# Patient Record
Sex: Male | Born: 1945 | Race: White | Hispanic: No | Marital: Married | Smoking: Never smoker
Health system: Southern US, Community
[De-identification: ages and names within clinical notes are randomized; demographics above are authoritative.]

## PROBLEM LIST (undated history)

## (undated) DIAGNOSIS — Z86718 Personal history of other venous thrombosis and embolism: Secondary | ICD-10-CM

## (undated) DIAGNOSIS — N138 Other obstructive and reflux uropathy: Secondary | ICD-10-CM

## (undated) DIAGNOSIS — I10 Essential (primary) hypertension: Secondary | ICD-10-CM

## (undated) DIAGNOSIS — R972 Elevated prostate specific antigen [PSA]: Secondary | ICD-10-CM

## (undated) DIAGNOSIS — Z860100 Personal history of colon polyps, unspecified: Secondary | ICD-10-CM

## (undated) DIAGNOSIS — M545 Low back pain, unspecified: Secondary | ICD-10-CM

## (undated) DIAGNOSIS — F419 Anxiety disorder, unspecified: Secondary | ICD-10-CM

## (undated) DIAGNOSIS — N401 Enlarged prostate with lower urinary tract symptoms: Secondary | ICD-10-CM

## (undated) DIAGNOSIS — M4800 Spinal stenosis, site unspecified: Secondary | ICD-10-CM

## (undated) DIAGNOSIS — E785 Hyperlipidemia, unspecified: Secondary | ICD-10-CM

## (undated) DIAGNOSIS — T8859XA Other complications of anesthesia, initial encounter: Secondary | ICD-10-CM

## (undated) DIAGNOSIS — K5730 Diverticulosis of large intestine without perforation or abscess without bleeding: Secondary | ICD-10-CM

## (undated) DIAGNOSIS — T4145XA Adverse effect of unspecified anesthetic, initial encounter: Secondary | ICD-10-CM

## (undated) DIAGNOSIS — J984 Other disorders of lung: Secondary | ICD-10-CM

## (undated) DIAGNOSIS — I872 Venous insufficiency (chronic) (peripheral): Secondary | ICD-10-CM

## (undated) DIAGNOSIS — N200 Calculus of kidney: Secondary | ICD-10-CM

## (undated) DIAGNOSIS — D689 Coagulation defect, unspecified: Secondary | ICD-10-CM

## (undated) DIAGNOSIS — Z87442 Personal history of urinary calculi: Secondary | ICD-10-CM

## (undated) DIAGNOSIS — M109 Gout, unspecified: Secondary | ICD-10-CM

## (undated) DIAGNOSIS — M1990 Unspecified osteoarthritis, unspecified site: Secondary | ICD-10-CM

## (undated) DIAGNOSIS — K219 Gastro-esophageal reflux disease without esophagitis: Secondary | ICD-10-CM

## (undated) HISTORY — DX: Diverticulosis of large intestine without perforation or abscess without bleeding: K57.30

## (undated) HISTORY — PX: JOINT REPLACEMENT: SHX530

## (undated) HISTORY — PX: CERVICAL DISC ARTHROPLASTY: SHX587

## (undated) HISTORY — PX: OTHER SURGICAL HISTORY: SHX169

## (undated) HISTORY — PX: TOTAL KNEE ARTHROPLASTY: SHX125

## (undated) HISTORY — DX: Other obstructive and reflux uropathy: N13.8

## (undated) HISTORY — DX: Venous insufficiency (chronic) (peripheral): I87.2

## (undated) HISTORY — DX: Hyperlipidemia, unspecified: E78.5

## (undated) HISTORY — PX: LITHOTRIPSY: SUR834

## (undated) HISTORY — DX: Coagulation defect, unspecified: D68.9

## (undated) HISTORY — DX: Other obstructive and reflux uropathy: N40.1

## (undated) HISTORY — PX: APPENDECTOMY: SHX54

## (undated) HISTORY — DX: Gastro-esophageal reflux disease without esophagitis: K21.9

## (undated) HISTORY — PX: HERNIA REPAIR: SHX51

## (undated) HISTORY — DX: Personal history of other venous thrombosis and embolism: Z86.718

## (undated) HISTORY — DX: Personal history of colon polyps, unspecified: Z86.0100

## (undated) HISTORY — DX: Calculus of kidney: N20.0

## (undated) HISTORY — DX: Personal history of colonic polyps: Z86.010

## (undated) HISTORY — DX: Other disorders of lung: J98.4

## (undated) HISTORY — DX: Essential (primary) hypertension: I10

## (undated) HISTORY — PX: PROSTATE BIOPSY: SHX241

## (undated) HISTORY — PX: TONSILLECTOMY: SUR1361

## (undated) HISTORY — DX: Unspecified osteoarthritis, unspecified site: M19.90

## (undated) HISTORY — DX: Low back pain, unspecified: M54.50

## (undated) HISTORY — DX: Anxiety disorder, unspecified: F41.9

## (undated) HISTORY — DX: Low back pain: M54.5

---

## 1997-10-09 ENCOUNTER — Encounter: Payer: Self-pay | Admitting: Urology

## 1997-10-14 ENCOUNTER — Ambulatory Visit (HOSPITAL_COMMUNITY): Admission: RE | Admit: 1997-10-14 | Discharge: 1997-10-14 | Payer: Self-pay | Admitting: Urology

## 1997-10-14 ENCOUNTER — Encounter: Payer: Self-pay | Admitting: Urology

## 1997-10-28 ENCOUNTER — Encounter: Payer: Self-pay | Admitting: Urology

## 1997-10-28 ENCOUNTER — Ambulatory Visit (HOSPITAL_COMMUNITY): Admission: RE | Admit: 1997-10-28 | Discharge: 1997-10-28 | Payer: Self-pay | Admitting: Urology

## 1999-03-05 ENCOUNTER — Ambulatory Visit (HOSPITAL_COMMUNITY): Admission: RE | Admit: 1999-03-05 | Discharge: 1999-03-05 | Payer: Self-pay | Admitting: Urology

## 1999-03-05 ENCOUNTER — Encounter: Payer: Self-pay | Admitting: Urology

## 1999-05-15 ENCOUNTER — Other Ambulatory Visit: Admission: RE | Admit: 1999-05-15 | Discharge: 1999-05-15 | Payer: Self-pay | Admitting: Internal Medicine

## 1999-05-15 ENCOUNTER — Encounter (INDEPENDENT_AMBULATORY_CARE_PROVIDER_SITE_OTHER): Payer: Self-pay | Admitting: Specialist

## 2002-08-06 ENCOUNTER — Ambulatory Visit (HOSPITAL_COMMUNITY): Admission: RE | Admit: 2002-08-06 | Discharge: 2002-08-06 | Payer: Self-pay | Admitting: Pulmonary Disease

## 2002-08-06 ENCOUNTER — Encounter: Payer: Self-pay | Admitting: Pulmonary Disease

## 2003-06-13 ENCOUNTER — Ambulatory Visit (HOSPITAL_COMMUNITY): Admission: RE | Admit: 2003-06-13 | Discharge: 2003-06-13 | Payer: Self-pay | Admitting: Urology

## 2004-06-26 ENCOUNTER — Ambulatory Visit: Payer: Self-pay | Admitting: Pulmonary Disease

## 2004-07-08 ENCOUNTER — Ambulatory Visit: Payer: Self-pay | Admitting: Pulmonary Disease

## 2004-12-07 ENCOUNTER — Ambulatory Visit: Payer: Self-pay | Admitting: Pulmonary Disease

## 2005-01-04 HISTORY — PX: OTHER SURGICAL HISTORY: SHX169

## 2005-01-05 ENCOUNTER — Encounter: Admission: RE | Admit: 2005-01-05 | Discharge: 2005-01-05 | Payer: Self-pay | Admitting: Specialist

## 2005-02-09 ENCOUNTER — Inpatient Hospital Stay (HOSPITAL_COMMUNITY): Admission: RE | Admit: 2005-02-09 | Discharge: 2005-02-10 | Payer: Self-pay | Admitting: Specialist

## 2005-02-09 ENCOUNTER — Encounter (INDEPENDENT_AMBULATORY_CARE_PROVIDER_SITE_OTHER): Payer: Self-pay | Admitting: *Deleted

## 2005-02-15 ENCOUNTER — Inpatient Hospital Stay (HOSPITAL_COMMUNITY): Admission: EM | Admit: 2005-02-15 | Discharge: 2005-02-20 | Payer: Self-pay | Admitting: Emergency Medicine

## 2005-02-19 ENCOUNTER — Ambulatory Visit: Payer: Self-pay | Admitting: Pulmonary Disease

## 2005-02-22 ENCOUNTER — Ambulatory Visit: Payer: Self-pay | Admitting: Pulmonary Disease

## 2005-02-26 ENCOUNTER — Ambulatory Visit: Payer: Self-pay | Admitting: Pulmonary Disease

## 2005-03-01 ENCOUNTER — Ambulatory Visit: Payer: Self-pay | Admitting: Internal Medicine

## 2005-03-08 ENCOUNTER — Ambulatory Visit: Payer: Self-pay | Admitting: Cardiology

## 2005-03-15 ENCOUNTER — Ambulatory Visit: Payer: Self-pay | Admitting: Cardiology

## 2005-04-07 ENCOUNTER — Ambulatory Visit: Payer: Self-pay | Admitting: Cardiology

## 2005-04-15 ENCOUNTER — Ambulatory Visit: Payer: Self-pay | Admitting: Pulmonary Disease

## 2005-04-26 ENCOUNTER — Ambulatory Visit: Payer: Self-pay | Admitting: Cardiology

## 2005-05-13 ENCOUNTER — Ambulatory Visit: Payer: Self-pay | Admitting: Cardiology

## 2005-06-03 ENCOUNTER — Ambulatory Visit: Payer: Self-pay | Admitting: Cardiology

## 2005-07-05 ENCOUNTER — Ambulatory Visit: Payer: Self-pay | Admitting: Cardiovascular Disease

## 2005-07-06 ENCOUNTER — Ambulatory Visit: Payer: Self-pay | Admitting: Pulmonary Disease

## 2005-07-13 ENCOUNTER — Ambulatory Visit: Payer: Self-pay | Admitting: Pulmonary Disease

## 2005-08-02 ENCOUNTER — Ambulatory Visit: Payer: Self-pay | Admitting: Cardiovascular Disease

## 2005-08-30 ENCOUNTER — Ambulatory Visit: Payer: Self-pay | Admitting: Cardiology

## 2005-09-27 ENCOUNTER — Ambulatory Visit: Payer: Self-pay | Admitting: Cardiology

## 2005-10-04 ENCOUNTER — Ambulatory Visit: Payer: Self-pay | Admitting: Pulmonary Disease

## 2005-10-25 ENCOUNTER — Ambulatory Visit: Payer: Self-pay | Admitting: Cardiovascular Disease

## 2005-11-22 ENCOUNTER — Ambulatory Visit: Payer: Self-pay | Admitting: Cardiology

## 2005-12-06 ENCOUNTER — Ambulatory Visit: Payer: Self-pay | Admitting: Internal Medicine

## 2006-01-03 ENCOUNTER — Ambulatory Visit: Payer: Self-pay | Admitting: Cardiovascular Disease

## 2006-01-31 ENCOUNTER — Ambulatory Visit: Payer: Self-pay | Admitting: Pulmonary Disease

## 2006-01-31 LAB — CONVERTED CEMR LAB
ALT: 66 units/L — ABNORMAL HIGH (ref 0–40)
Alkaline Phosphatase: 59 units/L (ref 39–117)
Basophils Absolute: 0 10*3/uL (ref 0.0–0.1)
Basophils Relative: 0.3 % (ref 0.0–1.0)
CO2: 28 meq/L (ref 19–32)
Cholesterol: 116 mg/dL (ref 0–200)
Creatinine, Ser: 1.1 mg/dL (ref 0.4–1.5)
Eosinophils Relative: 1.1 % (ref 0.0–5.0)
GFR calc non Af Amer: 72 mL/min
HCT: 49.9 % (ref 39.0–52.0)
HDL: 32.9 mg/dL — ABNORMAL LOW (ref >39.0)
Hemoglobin: 17 g/dL (ref 13.0–17.0)
Lymphocytes Relative: 28.5 % (ref 12.0–46.0)
Potassium: 4.6 meq/L (ref 3.5–5.1)
RBC: 5.42 M/uL (ref 4.22–5.81)
RDW: 12.9 % (ref 11.5–14.6)
TSH: 0.53 microintl units/mL (ref 0.35–5.50)
Total CHOL/HDL Ratio: 3.5
Triglycerides: 98 mg/dL (ref 0–149)
WBC: 7.3 10*3/uL (ref 4.5–10.5)

## 2006-03-30 ENCOUNTER — Ambulatory Visit: Payer: Self-pay | Admitting: Cardiology

## 2006-05-04 ENCOUNTER — Ambulatory Visit: Payer: Self-pay | Admitting: Cardiology

## 2006-06-01 ENCOUNTER — Ambulatory Visit: Payer: Self-pay | Admitting: Cardiology

## 2006-07-06 ENCOUNTER — Ambulatory Visit: Payer: Self-pay | Admitting: Cardiology

## 2006-08-01 ENCOUNTER — Ambulatory Visit: Payer: Self-pay | Admitting: Pulmonary Disease

## 2006-08-01 LAB — CONVERTED CEMR LAB
BUN: 13 mg/dL (ref 6–23)
Chloride: 105 meq/L (ref 96–112)
Cholesterol: 116 mg/dL (ref 0–200)
Creatinine, Ser: 1 mg/dL (ref 0.4–1.5)
Eosinophils Absolute: 0.1 10*3/uL (ref 0.0–0.6)
Eosinophils Relative: 1.5 % (ref 0.0–5.0)
GFR calc Af Amer: 98 mL/min
GFR calc non Af Amer: 81 mL/min
Glucose, Bld: 117 mg/dL — ABNORMAL HIGH (ref 70–99)
HCT: 48.4 % (ref 39.0–52.0)
HDL: 28.5 mg/dL — ABNORMAL LOW (ref >39.0)
LDL Cholesterol: 65 mg/dL (ref 0–99)
Monocytes Absolute: 1.1 10*3/uL — ABNORMAL HIGH (ref 0.2–0.7)
Platelets: 263 10*3/uL (ref 150–400)
RDW: 13 % (ref 11.5–14.6)
Total CHOL/HDL Ratio: 4.1
Triglycerides: 111 mg/dL (ref 0–149)
VLDL: 22 mg/dL (ref 0–40)

## 2006-08-08 ENCOUNTER — Ambulatory Visit: Payer: Self-pay | Admitting: Cardiology

## 2006-08-22 ENCOUNTER — Ambulatory Visit: Payer: Self-pay | Admitting: Cardiology

## 2006-08-29 ENCOUNTER — Ambulatory Visit: Payer: Self-pay | Admitting: Cardiology

## 2006-09-15 ENCOUNTER — Ambulatory Visit: Payer: Self-pay | Admitting: Internal Medicine

## 2006-09-22 ENCOUNTER — Ambulatory Visit: Payer: Self-pay | Admitting: Cardiology

## 2006-10-20 ENCOUNTER — Ambulatory Visit: Payer: Self-pay | Admitting: Cardiology

## 2006-11-17 ENCOUNTER — Ambulatory Visit: Payer: Self-pay | Admitting: Cardiovascular Disease

## 2006-12-15 ENCOUNTER — Ambulatory Visit: Payer: Self-pay | Admitting: Cardiovascular Disease

## 2007-01-16 ENCOUNTER — Telehealth (INDEPENDENT_AMBULATORY_CARE_PROVIDER_SITE_OTHER): Payer: Self-pay | Admitting: *Deleted

## 2007-01-19 ENCOUNTER — Ambulatory Visit: Payer: Self-pay | Admitting: Internal Medicine

## 2007-01-19 DIAGNOSIS — K219 Gastro-esophageal reflux disease without esophagitis: Secondary | ICD-10-CM | POA: Insufficient documentation

## 2007-01-20 ENCOUNTER — Ambulatory Visit: Payer: Self-pay

## 2007-01-20 ENCOUNTER — Encounter: Payer: Self-pay | Admitting: Pulmonary Disease

## 2007-01-20 ENCOUNTER — Encounter: Payer: Self-pay | Admitting: Internal Medicine

## 2007-01-26 ENCOUNTER — Encounter: Payer: Self-pay | Admitting: Internal Medicine

## 2007-01-26 ENCOUNTER — Ambulatory Visit: Payer: Self-pay

## 2007-01-27 DIAGNOSIS — K649 Unspecified hemorrhoids: Secondary | ICD-10-CM | POA: Insufficient documentation

## 2007-01-27 DIAGNOSIS — N401 Enlarged prostate with lower urinary tract symptoms: Secondary | ICD-10-CM | POA: Insufficient documentation

## 2007-01-27 DIAGNOSIS — F411 Generalized anxiety disorder: Secondary | ICD-10-CM | POA: Insufficient documentation

## 2007-01-27 DIAGNOSIS — E785 Hyperlipidemia, unspecified: Secondary | ICD-10-CM

## 2007-01-27 DIAGNOSIS — D126 Benign neoplasm of colon, unspecified: Secondary | ICD-10-CM | POA: Insufficient documentation

## 2007-01-27 DIAGNOSIS — M545 Low back pain: Secondary | ICD-10-CM

## 2007-01-27 DIAGNOSIS — N200 Calculus of kidney: Secondary | ICD-10-CM | POA: Insufficient documentation

## 2007-02-13 ENCOUNTER — Telehealth: Payer: Self-pay | Admitting: Pulmonary Disease

## 2007-02-22 ENCOUNTER — Ambulatory Visit: Payer: Self-pay | Admitting: Pulmonary Disease

## 2007-02-22 DIAGNOSIS — I10 Essential (primary) hypertension: Secondary | ICD-10-CM

## 2007-02-23 ENCOUNTER — Telehealth (INDEPENDENT_AMBULATORY_CARE_PROVIDER_SITE_OTHER): Payer: Self-pay | Admitting: *Deleted

## 2007-02-24 ENCOUNTER — Telehealth (INDEPENDENT_AMBULATORY_CARE_PROVIDER_SITE_OTHER): Payer: Self-pay | Admitting: *Deleted

## 2007-02-24 LAB — CONVERTED CEMR LAB
ALT: 49 units/L (ref 0–53)
AST: 29 units/L (ref 0–37)
Alkaline Phosphatase: 53 units/L (ref 39–117)
CO2: 27 meq/L (ref 19–32)
Chloride: 107 meq/L (ref 96–112)
Creatinine, Ser: 0.9 mg/dL (ref 0.4–1.5)
Eosinophils Relative: 1.7 % (ref 0.0–5.0)
Folate: >20 ng/mL
GFR calc Af Amer: 110 mL/min
HCT: 48.8 % (ref 39.0–52.0)
HDL: 32.4 mg/dL — ABNORMAL LOW (ref >39.0)
Hemoglobin: 16.6 g/dL (ref 13.0–17.0)
Lymphocytes Relative: 22 % (ref 12.0–46.0)
MCHC: 34 g/dL (ref 30.0–36.0)
MCV: 91.6 fL (ref 78.0–100.0)
Monocytes Absolute: 1 10*3/uL — ABNORMAL HIGH (ref 0.2–0.7)
Monocytes Relative: 11.1 % — ABNORMAL HIGH (ref 3.0–11.0)
Platelets: 237 10*3/uL (ref 150–400)
Potassium: 4.1 meq/L (ref 3.5–5.1)
RDW: 12.1 % (ref 11.5–14.6)
Sodium: 141 meq/L (ref 135–145)
TSH: 0.64 microintl units/mL (ref 0.35–5.50)
Total CHOL/HDL Ratio: 3.4
Total CK: 137 units/L (ref 7–195)
Total Protein: 6.5 g/dL (ref 6.0–8.3)

## 2007-03-29 ENCOUNTER — Ambulatory Visit: Payer: Self-pay | Admitting: Pulmonary Disease

## 2007-03-29 DIAGNOSIS — Z86718 Personal history of other venous thrombosis and embolism: Secondary | ICD-10-CM

## 2007-04-16 DIAGNOSIS — K5730 Diverticulosis of large intestine without perforation or abscess without bleeding: Secondary | ICD-10-CM | POA: Insufficient documentation

## 2007-04-16 DIAGNOSIS — K8020 Calculus of gallbladder without cholecystitis without obstruction: Secondary | ICD-10-CM | POA: Insufficient documentation

## 2007-05-02 ENCOUNTER — Encounter: Payer: Self-pay | Admitting: Pulmonary Disease

## 2007-05-08 ENCOUNTER — Telehealth (INDEPENDENT_AMBULATORY_CARE_PROVIDER_SITE_OTHER): Payer: Self-pay | Admitting: *Deleted

## 2007-07-27 ENCOUNTER — Encounter: Payer: Self-pay | Admitting: Pulmonary Disease

## 2007-11-02 ENCOUNTER — Ambulatory Visit: Payer: Self-pay | Admitting: Pulmonary Disease

## 2007-11-05 HISTORY — PX: OTHER SURGICAL HISTORY: SHX169

## 2007-11-20 ENCOUNTER — Telehealth: Payer: Self-pay | Admitting: Pulmonary Disease

## 2008-01-03 ENCOUNTER — Ambulatory Visit: Payer: Self-pay | Admitting: Pulmonary Disease

## 2008-02-19 ENCOUNTER — Ambulatory Visit: Payer: Self-pay | Admitting: Pulmonary Disease

## 2008-02-20 ENCOUNTER — Encounter: Payer: Self-pay | Admitting: Adult Health

## 2008-02-20 LAB — CONVERTED CEMR LAB
Bacteria, UA: NEGATIVE
Total Protein, Urine: 30 mg/dL — AB
pH: 5.5 (ref 5.0–8.0)

## 2008-02-26 ENCOUNTER — Ambulatory Visit: Payer: Self-pay | Admitting: Pulmonary Disease

## 2008-03-07 ENCOUNTER — Encounter: Payer: Self-pay | Admitting: Pulmonary Disease

## 2008-03-07 LAB — CONVERTED CEMR LAB
ALT: 46 units/L (ref 0–53)
Albumin: 4 g/dL (ref 3.5–5.2)
Alkaline Phosphatase: 52 units/L (ref 39–117)
Basophils Absolute: 0.1 10*3/uL (ref 0.0–0.1)
Basophils Relative: 0.8 % (ref 0.0–3.0)
CO2: 28 meq/L (ref 19–32)
Chloride: 108 meq/L (ref 96–112)
Creatinine, Ser: 1 mg/dL (ref 0.4–1.5)
Eosinophils Absolute: 0.2 10*3/uL (ref 0.0–0.7)
GFR calc non Af Amer: 80 mL/min
HDL: 33.5 mg/dL — ABNORMAL LOW (ref >39.0)
Hgb A1c MFr Bld: 7.1 % — ABNORMAL HIGH (ref 4.6–6.0)
LDL Cholesterol: 56 mg/dL (ref 0–99)
Lymphocytes Relative: 23.7 % (ref 12.0–46.0)
MCHC: 34.5 g/dL (ref 30.0–36.0)
MCV: 94.4 fL (ref 78.0–100.0)
Monocytes Relative: 9.9 % (ref 3.0–12.0)
Neutrophils Relative %: 63.4 % (ref 43.0–77.0)
Potassium: 4.5 meq/L (ref 3.5–5.1)
RBC: 5.01 M/uL (ref 4.22–5.81)
RDW: 12.5 % (ref 11.5–14.6)
Total Bilirubin: 0.8 mg/dL (ref 0.3–1.2)
Uric Acid, Serum: 6.2 mg/dL (ref 4.0–7.8)
VLDL: 14 mg/dL (ref 0–40)

## 2008-03-13 ENCOUNTER — Ambulatory Visit: Payer: Self-pay | Admitting: Cardiology

## 2008-05-13 ENCOUNTER — Telehealth (INDEPENDENT_AMBULATORY_CARE_PROVIDER_SITE_OTHER): Payer: Self-pay | Admitting: *Deleted

## 2008-05-15 ENCOUNTER — Ambulatory Visit: Payer: Self-pay | Admitting: Diagnostic Radiology

## 2008-05-15 ENCOUNTER — Emergency Department (HOSPITAL_BASED_OUTPATIENT_CLINIC_OR_DEPARTMENT_OTHER): Admission: EM | Admit: 2008-05-15 | Discharge: 2008-05-15 | Payer: Self-pay | Admitting: Emergency Medicine

## 2008-05-16 ENCOUNTER — Ambulatory Visit: Payer: Self-pay | Admitting: Pulmonary Disease

## 2008-05-20 ENCOUNTER — Telehealth: Payer: Self-pay | Admitting: Adult Health

## 2008-05-20 DIAGNOSIS — R55 Syncope and collapse: Secondary | ICD-10-CM

## 2008-05-30 ENCOUNTER — Ambulatory Visit: Payer: Self-pay | Admitting: Adult Health

## 2008-05-30 DIAGNOSIS — G4710 Hypersomnia, unspecified: Secondary | ICD-10-CM

## 2008-07-04 HISTORY — PX: COLONOSCOPY: SHX174

## 2008-07-09 ENCOUNTER — Ambulatory Visit: Payer: Self-pay | Admitting: Internal Medicine

## 2008-07-19 ENCOUNTER — Ambulatory Visit: Payer: Self-pay | Admitting: Pulmonary Disease

## 2008-07-19 ENCOUNTER — Telehealth (INDEPENDENT_AMBULATORY_CARE_PROVIDER_SITE_OTHER): Payer: Self-pay | Admitting: *Deleted

## 2008-07-19 ENCOUNTER — Ambulatory Visit: Payer: Self-pay

## 2008-07-23 ENCOUNTER — Ambulatory Visit: Payer: Self-pay | Admitting: Internal Medicine

## 2008-07-23 ENCOUNTER — Encounter: Payer: Self-pay | Admitting: Internal Medicine

## 2008-07-24 ENCOUNTER — Encounter: Payer: Self-pay | Admitting: Internal Medicine

## 2008-09-20 ENCOUNTER — Ambulatory Visit: Payer: Self-pay | Admitting: Pulmonary Disease

## 2008-09-20 DIAGNOSIS — I872 Venous insufficiency (chronic) (peripheral): Secondary | ICD-10-CM | POA: Insufficient documentation

## 2008-09-20 DIAGNOSIS — I8390 Asymptomatic varicose veins of unspecified lower extremity: Secondary | ICD-10-CM

## 2008-09-20 DIAGNOSIS — M1990 Unspecified osteoarthritis, unspecified site: Secondary | ICD-10-CM | POA: Insufficient documentation

## 2008-09-20 LAB — CONVERTED CEMR LAB
BUN: 14 mg/dL (ref 6–23)
CO2: 28 meq/L (ref 19–32)
GFR calc non Af Amer: 80.02 mL/min (ref >60)
Glucose, Bld: 199 mg/dL — ABNORMAL HIGH (ref 70–99)
Potassium: 4.8 meq/L (ref 3.5–5.1)
Sodium: 143 meq/L (ref 135–145)

## 2008-10-04 ENCOUNTER — Ambulatory Visit: Payer: Self-pay | Admitting: Pulmonary Disease

## 2009-02-25 ENCOUNTER — Encounter: Payer: Self-pay | Admitting: Pulmonary Disease

## 2009-03-18 ENCOUNTER — Ambulatory Visit: Payer: Self-pay | Admitting: Pulmonary Disease

## 2009-03-25 ENCOUNTER — Ambulatory Visit: Payer: Self-pay | Admitting: Pulmonary Disease

## 2009-03-30 LAB — CONVERTED CEMR LAB
ALT: 46 units/L (ref 0–53)
AST: 31 units/L (ref 0–37)
Alkaline Phosphatase: 46 units/L (ref 39–117)
Basophils Absolute: 0 10*3/uL (ref 0.0–0.1)
Basophils Relative: 0 % (ref 0.0–3.0)
CO2: 25 meq/L (ref 19–32)
Cholesterol: 114 mg/dL (ref 0–200)
Creatinine, Ser: 1.1 mg/dL (ref 0.4–1.5)
Eosinophils Absolute: 0.2 10*3/uL (ref 0.0–0.7)
GFR calc non Af Amer: 71.57 mL/min (ref >60)
HDL: 37.6 mg/dL — ABNORMAL LOW (ref >39.00)
Hgb A1c MFr Bld: 6.8 % — ABNORMAL HIGH (ref 4.6–6.5)
Lymphocytes Relative: 24.2 % (ref 12.0–46.0)
Lymphs Abs: 2.3 10*3/uL (ref 0.7–4.0)
MCHC: 33.4 g/dL (ref 30.0–36.0)
MCV: 93.6 fL (ref 78.0–100.0)
Monocytes Absolute: 0.7 10*3/uL (ref 0.1–1.0)
Neutro Abs: 6.2 10*3/uL (ref 1.4–7.7)
Neutrophils Relative %: 66.1 % (ref 43.0–77.0)
RDW: 12.4 % (ref 11.5–14.6)
TSH: 0.85 microintl units/mL (ref 0.35–5.50)
Total Bilirubin: 1.1 mg/dL (ref 0.3–1.2)
VLDL: 21 mg/dL (ref 0.0–40.0)

## 2009-04-03 ENCOUNTER — Telehealth (INDEPENDENT_AMBULATORY_CARE_PROVIDER_SITE_OTHER): Payer: Self-pay | Admitting: *Deleted

## 2009-06-27 ENCOUNTER — Encounter: Payer: Self-pay | Admitting: Pulmonary Disease

## 2009-07-22 ENCOUNTER — Ambulatory Visit (HOSPITAL_BASED_OUTPATIENT_CLINIC_OR_DEPARTMENT_OTHER): Admission: RE | Admit: 2009-07-22 | Discharge: 2009-07-22 | Payer: Self-pay | Admitting: Urology

## 2009-09-01 ENCOUNTER — Encounter: Payer: Self-pay | Admitting: Pulmonary Disease

## 2009-09-15 ENCOUNTER — Ambulatory Visit: Payer: Self-pay | Admitting: Pulmonary Disease

## 2009-09-16 LAB — CONVERTED CEMR LAB
ALT: 42 units/L (ref 0–53)
AST: 31 units/L (ref 0–37)
Albumin: 4.3 g/dL (ref 3.5–5.2)
Alkaline Phosphatase: 54 units/L (ref 39–117)
Bilirubin, Direct: 0.2 mg/dL (ref 0.0–0.3)
GFR calc non Af Amer: 65.26 mL/min (ref >60)
Potassium: 4.6 meq/L (ref 3.5–5.1)
Sodium: 141 meq/L (ref 135–145)
Total Protein: 7 g/dL (ref 6.0–8.3)

## 2009-09-19 ENCOUNTER — Encounter: Payer: Self-pay | Admitting: Pulmonary Disease

## 2009-10-31 ENCOUNTER — Encounter: Payer: Self-pay | Admitting: Pulmonary Disease

## 2010-01-22 ENCOUNTER — Other Ambulatory Visit: Payer: Self-pay | Admitting: Dermatology

## 2010-02-03 NOTE — Progress Notes (Signed)
Summary: rx req/ congestion  Phone Note Call from Patient Call back at Home Phone [phone]   Caller: Patient Call For: nadel Summary of Call: pt saw sn on 03/14/09. today c/o chest congestion/ cough/ yellow phlegm w/ chills/ seats/ low-grade fever x 1 day. no N or V. requests zpac as this usually helps [facility] [address] [facility] [place].  Initial call taken by: Tivis Ringer, CNA,  April 03, 2009 9:33 AM  Follow-up for Phone Call        Will forward to SN - pls advise if this is ok.  Thanks! Allergies: Niaspan, CIpro Gweneth Dimitri RN  April 03, 2009 9:43 AM    per SN---ok for pt to have zpak  #1  take as directed with no refills. thanks Randell Loop CMA  April 03, 2009 2:33 PM   Additional Follow-up for Phone Call Additional follow up Details #1::        pt aware of above recs per SN and aware z pak sent to [facility].  He verbalized understanding.  Gweneth Dimitri RN  April 03, 2009 2:42 PM     New/Updated Medications: ZITHROMAX Z-PAK 250 MG TABS (AZITHROMYCIN) take as directed Prescriptions: ZITHROMAX Z-PAK 250 MG TABS (AZITHROMYCIN) take as directed  #1 x 0   Entered by:   Gweneth Dimitri RN   Authorized by:   Michele Mcalpine MD   Signed by:   Gweneth Dimitri RN on 04/03/2009   Method used:   [facility]ron[facility] to        [facility] [phone]* (retail)       [address]       [place], [place]  [address]       Ph: [phone]       Fax: [phone]   RxID:   [id number]

## 2010-02-03 NOTE — Letter (Signed)
[facility]   Imported By: Sherian Rein 04/09/2009 07:47:31  _____________________________________________________________________  External Attachment:    Type:   Image     Comment:   External Document

## 2010-02-03 NOTE — Letter (Signed)
Summary: [facility]   Imported By: Sherian Rein 11/15/2009 09:55:01  _____________________________________________________________________  External Attachment:    Type:   Image     Comment:   External Document

## 2010-02-03 NOTE — Letter (Signed)
Summary: [facility]   Imported By: Lennie Odor 07/09/2009 16:30:26  _____________________________________________________________________  External Attachment:    Type:   Image     Comment:   External Document

## 2010-02-03 NOTE — Letter (Signed)
Summary: [facility]  [facility]   Imported By: Lennie Odor 09/10/2009 14:31:57  _____________________________________________________________________  External Attachment:    Type:   Image     Comment:   External Document

## 2010-02-03 NOTE — Assessment & Plan Note (Signed)
Summary: 6 month return/mhh   Primary Care Provider:  Kriste Basque  CC:  6 month ROV & review of mult medical problems....  History of Present Illness: 65 y/o WM here for a follow up visit... he has multiple medical problems as noted below...     ~  Nov09: he slipped on his driveway and broke his left ankle- required plate & 6 screws by DrDuda but we don't have any notes from him... pt reports now recovered, no prob w/ DVT in post-op period...   ~  Feb10:  he's had another GI bug w/ abd cramping, diarrhea, etc- improved w/ conservative Rx... also had urinary symptoms and C/S was neg- but he passed a small stone and symptoms resolved... doing better overall now... needs f/u Fasting labs/ CXR/ & refills for 2010...   ~  September 20, 2008:  had syncopal spell ?etiology 5/10- ER notes reviewed:  CT was neg & CT CSpine showed spondylosis, osteophytes, NAD;  given fluids/ rest/ no recurrence...  had leg swelling 7/10 w/ his underlying VI & VV- ven dopplers were neg for DVT...  had f/u colonoscopy 7/10 by [name]- divertics, hems, diminutive polyp in transverse colon w/ bx= polypoid mucosa, no adenoma, f/u planned 24yrs...  states sugars are "fair", but BS= 199 & A1c=7.8, weight down 5#, no complaints > we decided to add GLIMEPIRIDE 1mg /d...   ~  March 18, 2009:  40mo ROV doing satis, no new complaints or concerns... BS at home 130-190 range but wt up 6# to 268#... he will ret next wk for FASTING labs >> BS=149, A1c=6.8 (improved)...     Current Problem List:  ? of PULMONARY NODULE, LEFT LOWER LOBE (ICD-518.89) - CXR 3/10 showed ?nodular density on lat film & subseq CT Chest showed that this was an osteophyte, & also displayed some basilar fibrosis, 4mm RLL nodule, & 3cm right adrenal adenoma...  HYPERTENSION, BENIGN (ICD-401.1) - on ASA 81mg /d,  DIOVAN 160mg - 1/2 tab/d... BP=130/84 today... not checking BP's at home and he will get a BP cuff for this purpose... denies HA, fatigue, visual changes, CP,  palipit, dizziness, recurrent syncope, edema, etc.  ~  NuclearStressTest for surg clearance 1/09 was neg- ? soft tissue atten ant, no ischemia, EF= 57%...  ~  EKG in ER 5/10 showed NSR, no ectopy, IVCD, LAD, no acute changes.  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 hx left leg DVT after neck surgery 2007 treated w/ coumadin... now fully active without leg symptoms...  no recurrent prob after left leg fracture w/ surg 11/09 by DrDuda.  HYPERLIPIDEMIA (ICD-272.4) - on SIMVASTATIN 40mg /d...  ~  FLP 2/09 showed TChol 109, TG 100, HDL 32, LDL 57... continue med, better diet, get wt down!  ~  FLP 2/10 showed TChol 103, TG 69, HDL 34, LDL 56  ~  FLP 3/11 showed TChol 114, TG 105, HDL 38, LDL 55  DIABETES MELLITUS (ICD-250.00) - on METFORMIN 500mg Bid & GLIMEPIRIDE 1mg /d (added 9/10)... he has been unable to lose weight!  ~  labs 7/08 showed BS=117, HgA1c=6.5.[name]. rec> same meds, better diet...  ~  labs 2/09 showed BS=120... rec> same med, better diet and get weight down!!  ~  labs 2/10 showed BS= 145, A1c= 7.1  ~  labs 9/10 showed BS= 199, A1c= 7.8.[name]. rec> added GLIMEPIRIDE 1mg /d.  ~  labs 3/11 showed BS= 149, A1c= 6.8, weight = 268#, 70" tall, BMI= 38.5  G E R D (ICD-530.81) - he takes PREVACID 30mg /d, and RANITADINE 150mg  Qhs...   ~  he had EGD by DrPatterson 1994 showing gastritis ?due to NSAIDs, Rx'd Zantac...  DIVERTICULOSIS OF COLON (ICD-562.10) HEMORRHOIDS (ICD-455.6) COLONIC POLYPS (ICD-211.3)  ~  colonoscopy 5/01 by [name] showed sm polyps (hyperplastic), divertics, hems, & ileal inflamm- neg bx...   ~  colonoscopy 7/10 by [name]- divertics, hems, diminutive polyp in transverse colon w/ bx= polypoid mucosa, no adenoma, f/u planned 10y.   CHOLELITHIASIS (ICD-574.20) - gallstone noted on sonar 8/04... no symptoms- denies n/v, abd pain, etc...  BENIGN PROSTATIC HYPERTROPHY,  WITH OBSTRUCTION (ICD-600.01) - neg biopsies 10/06 by DrDahlstedt...  ~  9/10: pt states sl incr PSA's followed by DrPeterson Q43mo- we don't have records.  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   ~  CT Abd 7/09 by Urology showed bilat non-obstructing stones, incidental 2-3cm right adrenal adenoma, DJD Lspine.  ~  Feb10- states he passed a sm stone on his own...  DEGENERATIVE JOINT DISEASE (ICD-715.90) - he saw DrSupple for Ortho w/ shot in right knee 2010...  LOW BACK PAIN SYNDROME (ICD-724.2) - prev hx of T1-T2 surgery for HNP.[name]. recent eval by Susann Givens, DrTooke, DrWang... decided against surg and DrWang tried a shot to "burn" the nerve- "I'm 60% better"... still doing exercises and taking Celebrex- we decided to change to Mobic/ Tylenol Rx...  ANXIETY (ICD-300.00)   Allergies: 1)  ! * Niaspan 2)  Cipro  Comments:  Nurse/Medical Assistant: The patient's medications and allergies were reviewed with the patient and were updated in the Medication and Allergy Lists.  Past History:  Past Medical History:  ? 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) VENOUS INSUFFICIENCY (ICD-459.81) Hx of DEEP VEIN THROMBOSIS (ICD-453.8) HYPERLIPIDEMIA (ICD-272.4) DIABETES MELLITUS (ICD-250.00) G E R D (ICD-530.81) DIVERTICULOSIS OF COLON (ICD-562.10) COLONIC POLYPS (ICD-211.3) HEMORRHOIDS (ICD-455.6) CHOLELITHIASIS (ICD-574.20) BENIGN PROSTATIC HYPERTROPHY, WITH OBSTRUCTION (ICD-600.01) NEPHROLITHIASIS (ICD-592.0) DEGENERATIVE JOINT DISEASE (ICD-715.90) LOW BACK PAIN SYNDROME (ICD-724.2) ANXIETY (ICD-300.00)  Past Surgical History: S/P sebaceous cyst surg S/P bilat inguinal hernia repairs S/P T1-T2 disc surgery ny DrNitka 2007 S/P left ankle surg w/plate & screws 42/59 by DrDuda  Family History: Reviewed history from 01/19/2007 and no changes required. gout in father Coronary Heart  Disease father in 40's no clotting dz  Social History: Reviewed history from 07/19/2008 and no changes required. Patient never smoked.  married 2 children, 2 grandchildren no alcohol  Review of Systems      See HPI       The patient complains of dyspnea on exertion.  The patient denies anorexia, fever, weight loss, weight gain, vision loss, decreased hearing, hoarseness, chest pain, syncope, peripheral edema, prolonged cough, headaches, hemoptysis, abdominal pain, melena, hematochezia, severe indigestion/heartburn, hematuria, incontinence, muscle weakness, suspicious skin lesions, transient blindness, difficulty walking, depression, unusual weight change, abnormal bleeding, enlarged lymph nodes, and angioedema.    Vital Signs:  Patient profile:   65 year old male Height:      70 inches Weight:      268 pounds BMI:     38.59 O2 Sat:      96 % on Room air Temp:     98.9 degrees F oral Pulse rate:   99 / minute BP sitting:   130 / 82  (right arm) Cuff size:   regular  Vitals Entered By: Randell Loop CMA (March 18, 2009 9:41 AM)  O2 Sat at Rest %:  96 O2  Flow:  Room air CC: 6 month ROV & review of mult medical problems... Is Patient Diabetic? Yes Pain Assessment Patient in pain? no      Comments meds updated today--questions about zyrtec   Physical Exam  Additional Exam:  WD, Obese, 65 y/o WM in NAD... GENERAL:  Alert & oriented; pleasant & cooperative... HEENT:  [place]/AT, EOM-wnl, PERRLA, EACs-clear, TMs-wnl, NOSE-clear, THROAT-clear & wnl. NECK:  Supple w/ fair ROM; no JVD; normal carotid impulses w/o bruits; no thyromegaly or nodules palpated; no lymphadenopathy. CHEST:  Clear to P & A; without wheezes/ rales/ or rhonchi. HEART:  Regular Rhythm; without murmurs/ rubs/ or gallops. ABDOMEN:  Obese, soft & nontender; normal bowel sounds; no organomegaly or masses detected. EXT: without deformities, mild arthritic changes; +varicose veins/ +venous insuffic/ tr edema. NEURO:   CN's intact; motor testing normal; sensory testing normal; gait normal & balance OK. DERM:  no rash etc...    MISC. Report  Procedure date:  03/25/2009  Findings:      Lipid Panel (LIPID)   Cholesterol               114 mg/dL                   0-454   Triglycerides             105.0 mg/dL                 0.9-811.9   HDL                  [L]  14.78 mg/dL                 >29.56   LDL Cholesterol           55 mg/dL                    2-13  BMP (METABOL)   Sodium                    141 mEq/L                   135-145   Potassium                 4.3 mEq/L                   3.5-5.1   Chloride                  106 mEq/L                   96-112   Carbon Dioxide            25 mEq/L                    19-32   Glucose              [H]  149 mg/dL                   08-65   BUN                       12 mg/dL                    7-84   Creatinine                1.1 mg/dL  0.4-1.5   Calcium                   9.2 mg/dL                   0.4-54.0   GFR                       71.57 mL/min                >60  Hemoglobin A1C (A1C)   Hemoglobin A1C       [H]  6.8 %                       4.6-6.5  Hepatic/Liver Function Panel (HEPATIC)   Total Bilirubin           1.1 mg/dL                   9.8-1.1   Direct Bilirubin          0.2 mg/dL                   9.1-4.7   Alkaline Phosphatase      46 U/L                      39-117   AST                       31 U/L                      0-37   ALT                       46 U/L                      0-53   Total Protein             6.6 g/dL                    8.2-9.5   Albumin                   3.9 g/dL                    6.2-1.3  Comments:      CBC Platelet w/Diff (CBCD)   White Cell Count          9.4 K/uL                    4.5-10.5   Red Cell Count            5.14 Mil/uL                 4.22-5.81   Hemoglobin                16.1 g/dL                   08.6-57.8   Hematocrit                48.1 %                      39.0-52.0   MCV  93.6 fl                     78.0-100.0   Platelet Count            266.0 K/uL                  150.0-400.0   Neutrophil %              66.1 %                      43.0-77.0   Lymphocyte %              24.2 %                      12.0-46.0   Monocyte %                7.8 %                       3.0-12.0   Eosinophils%              1.9 %                       0.0-5.0   Basophils %               0.0 %                       0.0-3.0  TSH (TSH)   FastTSH                   0.85 uIU/mL                 0.35-5.50  Prostate Specific Antigen (PSA)   PSA-Hyb              [H]  4.62 ng/mL                  0.10-4.00   Impression & Recommendations:  Problem # 1:  HYPERTENSION, BENIGN (ICD-401.1) Controlled on meds-  continue same. His updated medication list for this problem includes:    Diovan 160 Mg Tabs (Valsartan) .[name]... Take 1/2  tablet by mouth once a day  Problem # 2:  HYPERLIPIDEMIA (ICD-272.4) Controlled on Simva40-  continue same, and must diet & lose wt. His updated medication list for this problem includes:    Simvastatin 40 Mg Tabs (Simvastatin) .[name]... Take 1 tab by mouth at bedtime...  Problem # 3:  DIABETES MELLITUS (ICD-250.00) Control improved w/ addition of Glimep... A1c= 6.8.Marland KitchenMarland Kitchen continue same. His updated medication list for this problem includes:    Bayer Low Strength 81 Mg Tbec (Aspirin) .[name]... Take 1 tablet by mouth once a day    Diovan 160 Mg Tabs (Valsartan) .[name]... Take 1/2  tablet by mouth once a day    Metformin Hcl 500 Mg Tabs (Metformin hcl) .[name]... Take 1 tablet by mouth twice a day    Glimepiride 1 Mg Tabs (Glimepiride) .[name]... Take 1 tab by mouth once daily in the am...  Problem # 4:  G E R D (ICD-530.81) GI is stable... same Rx. His updated medication list for this problem includes:    Prevacid 30 Mg Cpdr (Lansoprazole) .[name]... Take one capsule once daily    Ranitidine Hcl 150 Mg Caps (Ranitidine hcl) .[name]... 1 tab by mouth at bedtime...  Problem #  5:   BENIGN PROSTATIC HYPERTROPHY, WITH OBSTRUCTION (ICD-600.01) He has sl elevated PSA followed by DrPeterson... we will fax/ mail copy of labs to his attention...  Problem # 6:  OTHER MEDICAL PROBLEMS AS NOTED>>>  Complete Medication List: 1)  Zyrtec Allergy 10 Mg Tabs (Cetirizine hcl) .... Take 1 tablet by mouth once a day 2)  Bayer Low Strength 81 Mg Tbec (Aspirin) .... Take 1 tablet by mouth once a day 3)  Diovan 160 Mg Tabs (Valsartan) .... Take 1/2  tablet by mouth once a day 4)  Simvastatin 40 Mg Tabs (Simvastatin) .... Take 1 tab by mouth at bedtime.Marland KitchenMarland Kitchen 5)  Metformin Hcl 500 Mg Tabs (Metformin hcl) .... Take 1 tablet by mouth twice a day 6)  Glimepiride 1 Mg Tabs (Glimepiride) .... Take 1 tab by mouth once daily in the am... 7)  Prevacid 30 Mg Cpdr (Lansoprazole) .... Take one capsule once daily 8)  Ranitidine Hcl 150 Mg Caps (Ranitidine hcl) .[name].. 1 tab by mouth at bedtime.Marland KitchenMarland Kitchen 9)  Multivitamins Tabs (Multiple vitamin) .... Take 1 tablet by mouth once a day 10)  Meloxicam 7.5 Mg Tabs (Meloxicam) .... Take 1 tab by mouth once daily as needed for arthritis pain... 11)  Tussionex Pennkinetic Er 8-10 Mg/65ml Lqcr (Chlorpheniramine-hydrocodone) .... Take 1 tsp by mouth at bedtime as needed for cough... 12)  Nasonex 50 Mcg/act Susp (Mometasone furoate) .... 2 sprays in each nostril at bedtime...  Patient Instructions: 1)  Today we updated your med list- see below.... 2)  Continue your current meds the same for now... 3)  FOR YOUR SINUS DRAINAGE:  take Claritin or Zyrtek (OTC) in the AM...  spray a SALINE nasal spray every 1-2 hours regularly & gently clean out the nasal passages...  then spray the NASONEX 2 sprays in each nostril at bedtime.Marland KitchenMarland Kitchen 4)  You may also use the new TUSSIONEX cough syrup at bedtime.Marland KitchenMarland Kitchen 5)  Please return to our lab one morning next week for your FASTING blood work... then call the "phone tree" in a few days for your lab results.Marland KitchenMarland Kitchen 6)  Peyton Najjar, let's get on track w/ our diet &  exercise program... 7)  Call for any questions... 8)  Please schedule a follow-up appointment in 6 months. Prescriptions: NASONEX 50 MCG/ACT SUSP (MOMETASONE FUROATE) 2 sprays in each nostril at bedtime...  #1 x prn   Entered and Authorized by:   Michele Mcalpine MD   Signed by:   Michele Mcalpine MD on 03/18/2009   Method used:   Print then Give to Patient   RxID:   [id number] TUSSIONEX PENNKINETIC ER 8-10 MG/5ML LQCR (CHLORPHENIRAMINE-HYDROCODONE) take 1 tsp by mouth at bedtime as needed for cough...  #4 oz x 5   Entered and Authorized by:   Michele Mcalpine MD   Signed by:   Michele Mcalpine MD on 03/18/2009   Method used:   Print then Give to Patient   RxID:   [id number]

## 2010-02-03 NOTE — Consult Note (Signed)
Summary: [facility]   Imported By: Sherian Rein 10/07/2009 10:50:55  _____________________________________________________________________  External Attachment:    Type:   Image     Comment:   External Document

## 2010-02-03 NOTE — Assessment & Plan Note (Signed)
Summary: rov 6 months///kp   Primary Care Provider:  Kriste Basque  CC:  6 month ROV & review of mult medical problems....  History of Present Illness: 65 y/o WM here for a follow up visit... he has multiple medical problems as noted below...     ~  Nov09: he slipped on his driveway and broke his left ankle- required plate & 6 screws by DrDuda but we don't have any notes from him... pt reports now recovered, no prob w/ DVT in post-op period...   ~  Feb10:  he's had another GI bug w/ abd cramping, diarrhea, etc- improved w/ conservative Rx... also had urinary symptoms and C/S was neg- but he passed a small stone and symptoms resolved... doing better overall now... needs f/u Fasting labs/ CXR/ & refills for 2010...   ~  September 20, 2008:  had syncopal spell ?etiology 5/10- ER notes reviewed:  CT was neg & CT CSpine showed spondylosis, osteophytes, NAD;  given fluids/ rest/ no recurrence...  had leg swelling 7/10 w/ his underlying VI & VV- ven dopplers were neg for DVT...  had f/u colonoscopy 7/10 by [name]- divertics, hems, diminutive polyp in transverse colon w/ bx= polypoid mucosa, no adenoma, f/u planned 83yrs...  states sugars are "fair", but BS= 199 & A1c=7.8, weight down 5#, no complaints > we decided to add GLIMEPIRIDE 1mg /d...   ~  March 18, 2009:  476mo ROV doing satis, no new complaints or concerns... BS at home 130-190 range but wt up 6# to 268#... he will ret next wk for FASTING labs >> BS=149, A1c=6.8 (improved)...    ~  September 15, 2009:  he's had a busy 476mo- left knee arthroscopy by DrSupple 2 wks ago & kidney stone surg DrPeterson 7/11 w/ cysto, ureteroscopy, laser frag & stent;  then f/u Urology 8/11 for this & his BPH, incr PSA & ED> he says PSA was "OK"...  BP is controlled on diovan;  FLP looks good on Simva40;  DM reasonable on his Metform+Glimep as he needs to lose weight to affect better control!...  daughter wants him to have Rheum consult;  he wants handicap sticker & 90d refills;   OK 2011 Flu vaccine.    Current Problem List:  ? of PULMONARY NODULE, LEFT LOWER LOBE (ICD-518.89) - CXR 3/10 showed ?nodular density on lat film & subseq CT Chest showed that this was an osteophyte, & also displayed some basilar fibrosis, 4mm RLL nodule, & 3cm right adrenal adenoma...  HYPERTENSION, BENIGN (ICD-401.1) - on ASA 81mg /d,  DIOVAN 160mg - 1/2 tab/d... BP=110/80 today & similar at home when he checks it... denies HA, fatigue, visual changes, CP, palipit, dizziness, recurrent syncope, edema, etc.  ~  NuclearStressTest for surg clearance 1/09 was neg- ? soft tissue atten ant, no ischemia, EF= 57%...  ~  EKG in ER 5/10 showed NSR, no ectopy, IVCD, LAD, no acute changes.  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 hx left leg DVT after neck surgery 2007 treated w/ coumadin... now fully active without leg symptoms...  no recurrent prob after left leg fracture w/ surg 11/09 by DrDuda.  HYPERLIPIDEMIA (ICD-272.4) - on SIMVASTATIN 40mg /d...  ~  FLP 2/09 showed TChol 109, TG 100, HDL 32, LDL 57... continue med, better diet, get wt down!  ~  FLP 2/10 showed TChol 103, TG 69, HDL 34, LDL 56  ~  FLP 3/11 showed TChol 114, TG 105,  HDL 38, LDL 55  ~  FLP 9/11 showed TChol 128, TG 134, HDL 30, LDL 72... needs incr exerc & get wt down.  DIABETES MELLITUS (ICD-250.00) - on METFORMIN 500mg Bid & GLIMEPIRIDE 1mg /d (added 9/10)... he has been unable to lose weight!  ~  labs 7/08 showed BS=117, HgA1c=6.5.[name]. rec> same meds, better diet...  ~  labs 2/09 showed BS=120... rec> same med, better diet and get weight down!!  ~  labs 2/10 showed BS= 145, A1c= 7.1  ~  labs 9/10 showed BS= 199, A1c= 7.8.[name]. rec> added GLIMEPIRIDE 1mg /d.  ~  labs 3/11 showed BS= 149, A1c= 6.8, weight = 268#, 70" tall, BMI= 38.5  ~  labs 9/11 showed BS= 135, A1c= 7.1  G E R D (ICD-530.81) - he takes PREVACID 30mg /d, and  RANITADINE 150mg  Qhs...   ~  he had EGD by DrPatterson 1994 showing gastritis ?due to NSAIDs, Rx'd Zantac...  DIVERTICULOSIS OF COLON (ICD-562.10) HEMORRHOIDS (ICD-455.6) COLONIC POLYPS (ICD-211.3)  ~  colonoscopy 5/01 by [name] showed sm polyps (hyperplastic), divertics, hems, & ileal inflamm- neg bx...   ~  colonoscopy 7/10 by [name]- divertics, hems, diminutive polyp in transverse colon w/ bx= polypoid mucosa, no adenoma, f/u planned 10y.   CHOLELITHIASIS (ICD-574.20) - gallstone noted on sonar 8/04... no symptoms- denies n/v, abd pain, etc...  BENIGN PROSTATIC HYPERTROPHY, WITH OBSTRUCTION (ICD-600.01) - neg biopsies 10/06 by DrDahlstedt...  ~  9/10: pt states sl incr PSA's followed by DrPeterson Q78mo- we don't have their lab data.  NEPHROLITHIASIS (ICD-592.0) - followed by DrPeterson> "I've had them ever since I was 16"...  ~  CT Abd 7/09 by Urology showed bilat non-obstructing stones, incidental 2-3cm right adrenal adenoma, DJD Lspine.  ~  2/10: states he passed a sm stone on his own...  ~  7/11:  s/p cysto, ureteroscopy, laser frag & stent for yet another stone.  DEGENERATIVE JOINT DISEASE (ICD-715.90) - c/o bilat knee pain> he saw DrSupple for Ortho w/ shot in right knee 2010 (helped); shot didn't help on left- therefore left knee arthroscopy 8/11...  LOW BACK PAIN SYNDROME (ICD-724.2) - prev hx of T1-T2 surgery for HNP.[name]. recent eval by Susann Givens, DrTooke, DrWang... decided against surg and DrWang tried a shot to "burn" the nerve- "I'm 60% better"... still doing exercises and taking Celebrex- we decided to change to Mobic/ Tylenol Rx...  ANXIETY (ICD-300.00)   Preventive Screening-Counseling & Management  Alcohol-Tobacco     Smoking Status: never  Allergies: 1)  ! * Niaspan 2)  Cipro  Comments:  Nurse/Medical Assistant: The patient's medications and allergies were reviewed with the patient and were updated in the Medication and Allergy Lists.  Past History:  Past  Medical History: ? 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) VENOUS INSUFFICIENCY (ICD-459.81) Hx of DEEP VEIN THROMBOSIS (ICD-453.8) HYPERLIPIDEMIA (ICD-272.4) DIABETES MELLITUS (ICD-250.00) G E R D (ICD-530.81) DIVERTICULOSIS OF COLON (ICD-562.10) COLONIC POLYPS (ICD-211.3) HEMORRHOIDS (ICD-455.6) CHOLELITHIASIS (ICD-574.20) BENIGN PROSTATIC HYPERTROPHY, WITH OBSTRUCTION (ICD-600.01) NEPHROLITHIASIS (ICD-592.0) DEGENERATIVE JOINT DISEASE (ICD-715.90) LOW BACK PAIN SYNDROME (ICD-724.2) ANXIETY (ICD-300.00)  Past Surgical History: S/P sebaceous cyst surg S/P bilat inguinal hernia repairs S/P T1-T2 disc surgery ny DrNitka 2007 S/P left ankle surg w/plate & screws 02/72 by DrDuda  Family History: Reviewed history from 01/19/2007 and no changes required. gout in father Coronary Heart Disease father in 41's no clotting dz  Social History: Reviewed history from 07/19/2008 and no changes required. Patient never smoked.  married 2 children, 2  grandchildren no alcohol  Review of Systems      See HPI       The patient complains of difficulty walking.  The patient denies anorexia, fever, weight loss, weight gain, vision loss, decreased hearing, hoarseness, chest pain, syncope, dyspnea on exertion, peripheral edema, prolonged cough, headaches, hemoptysis, abdominal pain, melena, hematochezia, severe indigestion/heartburn, hematuria, incontinence, muscle weakness, suspicious skin lesions, transient blindness, depression, unusual weight change, abnormal bleeding, enlarged lymph nodes, and angioedema.    Vital Signs:  Patient profile:   65 year old male Height:      70 inches Weight:      263.50 pounds O2 Sat:      97 % on Room air Temp:     97.9 degrees F oral Pulse rate:   101 / minute BP sitting:   110 / 80  (left arm) Cuff size:   regular  Vitals Entered By: Randell Loop CMA (September 15, 2009 9:18 AM)  O2 Sat at Rest %:  97 O2 Flow:  Room air CC: 6 month ROV & review of mult medical problems... Is Patient Diabetic? Yes Pain Assessment Patient in pain? no      Comments meds updated today with pt   Physical Exam  Additional Exam:  WD, Obese, 65 y/o WM in NAD... GENERAL:  Alert & oriented; pleasant & cooperative... HEENT:  [place]/AT, EOM-wnl, PERRLA, EACs-clear, TMs-wnl, NOSE-clear, THROAT-clear & wnl. NECK:  Supple w/ fair ROM; no JVD; normal carotid impulses w/o bruits; no thyromegaly or nodules palpated; no lymphadenopathy. CHEST:  Clear to P & A; without wheezes/ rales/ or rhonchi. HEART:  Regular Rhythm; without murmurs/ rubs/ or gallops. ABDOMEN:  Obese, soft & nontender; normal bowel sounds; no organomegaly or masses detected. EXT: without deformities, mod arthritic changes & recent surg left knee; +varicose veins/ +venous insuffic/ tr edema. NEURO:  CN's intact; motor testing normal; sensory testing normal; gait normal & balance OK. DERM:  no rash etc...    MISC. Report  Procedure date:  09/15/2009  Findings:      BMP (METABOL)   Sodium                    141 mEq/L                   135-145   Potassium                 4.6 mEq/L                   3.5-5.1   Chloride                  105 mEq/L                   96-112   Carbon Dioxide            26 mEq/L                    19-32   Glucose              [H]  135 mg/dL                   16-10   BUN                       17 mg/dL  6-23   Creatinine                1.2 mg/dL                   1.6-1.0   Calcium                   9.6 mg/dL                   9.6-04.5   GFR                       65.26 mL/min                >60  Hemoglobin A1C (A1C)   Hemoglobin A1C       [H]  7.1 %                       4.6-6.5   Hepatic/Liver Function Panel (HEPATIC)   Total Bilirubin           1.0 mg/dL                   4.0-9.8   Direct Bilirubin          0.2 mg/dL                   1.1-9.1   Alkaline  Phosphatase      54 U/L                      39-117   AST                       31 U/L                      0-37   ALT                       42 U/L                      0-53   Total Protein             7.0 g/dL                    4.7-8.2   Albumin                   4.3 g/dL                    9.5-6.2   Lipid Panel (LIPID)   Cholesterol               128 mg/dL                   1-308   Triglycerides             134.0 mg/dL                 6.5-784.6   HDL                  [L]  96.29 mg/dL                 >52.84   LDL Cholesterol           72 mg/dL  0-99   Impression & Recommendations:  Problem # 1:  HYPERTENSION, BENIGN (ICD-401.1) Controlled>  same med, needs better diet, get wt down. His updated medication list for this problem includes:    Diovan 160 Mg Tabs (Valsartan) .[name]... Take 1/2  tablet by mouth once a day  Orders: TLB-BMP (Basic Metabolic Panel-BMET) (80048-METABOL) TLB-A1C / Hgb A1C (Glycohemoglobin) (83036-A1C) TLB-Hepatic/Liver Function Pnl (80076-HEPATIC) TLB-Lipid Panel (80061-LIPID)  Problem # 2:  VENOUS INSUFFICIENCY (ICD-459.81) Reminded no salt, elevation, support hose...  Problem # 3:  HYPERLIPIDEMIA (ICD-272.4) stable>  continue Simva40 but needs better diet & incr exercise for the HDL. His updated medication list for this problem includes:    Simvastatin 40 Mg Tabs (Simvastatin) .[name]... Take 1 tab by mouth at bedtime...  Problem # 4:  DIABETES MELLITUS (ICD-250.00) Fair control>  either needs incr meds or better diet & get wt down> he prefers latter. His updated medication list for this problem includes:    Bayer Low Strength 81 Mg Tbec (Aspirin) .[name]... Take 1 tablet by mouth once a day    Diovan 160 Mg Tabs (Valsartan) .[name]... Take 1/2  tablet by mouth once a day    Metformin Hcl 500 Mg Tabs (Metformin hcl) .[name]... Take 1 tablet by mouth twice a day    Glimepiride 1 Mg Tabs (Glimepiride) .[name]... Take 1 tab by mouth once daily in the  am...  Problem # 5:  GI >>> He has GERD, Divertics, polyps, hems, gallstones>  followed by DrPatterson... continue current Rx & get wt down!  Problem # 6:  GU >>> Followed by DrPeterson for BPH, hx incr PSA, kidney stones, & ED>  Urology does his PSAs & follows regularly.  Problem # 7:  DEGENERATIVE JOINT DISEASE (ICD-715.90) Followed by DrSupple for Ortho & recent left knee arthroscopy... daughter wants him to have Rheum consult & we will request DrZ... His updated medication list for this problem includes:    Bayer Low Strength 81 Mg Tbec (Aspirin) .[name]... Take 1 tablet by mouth once a day    Meloxicam 7.5 Mg Tabs (Meloxicam) .[name]... Take 1 tab by mouth once daily as needed for arthritis pain...  Orders: Rheumatology Referral (Rheumatology)  Problem # 8:  OTHER MEDICAL PROBLEMS AS NOTED>>>  Complete Medication List: 1)  Zyrtec Allergy 10 Mg Tabs (Cetirizine hcl) .... Take 1 tablet by mouth once a day 2)  Nasonex 50 Mcg/act Susp (Mometasone furoate) .... 2 sprays in each nostril at bedtime.Marland KitchenMarland Kitchen 3)  Bayer Low Strength 81 Mg Tbec (Aspirin) .... Take 1 tablet by mouth once a day 4)  Diovan 160 Mg Tabs (Valsartan) .... Take 1/2  tablet by mouth once a day 5)  Simvastatin 40 Mg Tabs (Simvastatin) .... Take 1 tab by mouth at bedtime.Marland KitchenMarland Kitchen 6)  Metformin Hcl 500 Mg Tabs (Metformin hcl) .... Take 1 tablet by mouth twice a day 7)  Glimepiride 1 Mg Tabs (Glimepiride) .... Take 1 tab by mouth once daily in the am... 8)  Prevacid 30 Mg Cpdr (Lansoprazole) .... Take one capsule once daily 9)  Ranitidine Hcl 150 Mg Caps (Ranitidine hcl) .[name].. 1 tab by mouth at bedtime... 10)  Multivitamins Tabs (Multiple vitamin) .... Take 1 tablet by mouth once a day 11)  Meloxicam 7.5 Mg Tabs (Meloxicam) .... Take 1 tab by mouth once daily as needed for arthritis pain...  Other Orders: Admin 1st Vaccine (91478) Flu Vaccine 73yrs + [phone])  Patient Instructions: 1)  Today we updated your med list- see below.... 2)  We  refilled your meds for 90d supplies as requested... 3)  We gave you the 2011 Flu vaccine... 4)  We signed for a handicap sticker... 5)  Today we did your follow up FASTING blood work... please call the "phone tree" in a few days for your lab results.Marland KitchenMarland Kitchen 6)  Finally we will try to set up a referral to a Rheumatologist for you... 7)  Call for any questions... 8)  Please schedule a follow-up appointment in 6 months. Prescriptions: NASONEX 50 MCG/ACT SUSP (MOMETASONE FUROATE) 2 sprays in each nostril at bedtime...  #3 x 4   Entered and Authorized by:   Michele Mcalpine MD   Signed by:   Michele Mcalpine MD on 09/15/2009   Method used:   Print then Give to Patient   RxID:   [id number] MELOXICAM 7.5 MG TABS (MELOXICAM) take 1 tab by mouth once daily as needed for arthritis pain...  #90 x 4   Entered and Authorized by:   Michele Mcalpine MD   Signed by:   Michele Mcalpine MD on 09/15/2009   Method used:   Print then Give to Patient   RxID:   [id number] RANITIDINE HCL 150 MG CAPS (RANITIDINE HCL) 1 tab by mouth at bedtime...  #90 x 4   Entered and Authorized by:   Michele Mcalpine MD   Signed by:   Michele Mcalpine MD on 09/15/2009   Method used:   Print then Give to Patient   RxID:   [id number] PREVACID 30 MG  CPDR (LANSOPRAZOLE) take one capsule once daily  #90 x 4   Entered and Authorized by:   Michele Mcalpine MD   Signed by:   Michele Mcalpine MD on 09/15/2009   Method used:   Print then Give to Patient   RxID:   [id number] GLIMEPIRIDE 1 MG TABS (GLIMEPIRIDE) take 1 tab by mouth once daily in the AM...  #90 x 4   Entered and Authorized by:   Michele Mcalpine MD   Signed by:   Michele Mcalpine MD on 09/15/2009   Method used:   Print then Give to Patient   RxID:   [id number] METFORMIN HCL 500 MG TABS (METFORMIN HCL) Take 1 tablet by mouth twice a day  #180 x 4   Entered and Authorized by:   Michele Mcalpine MD   Signed by:   Michele Mcalpine MD on 09/15/2009   Method used:   Print then Give  to Patient   RxID:   [id number] SIMVASTATIN 40 MG TABS (SIMVASTATIN) take 1 tab by mouth at bedtime...  #90 x 4   Entered and Authorized by:   Michele Mcalpine MD   Signed by:   Michele Mcalpine MD on 09/15/2009   Method used:   Print then Give to Patient   RxID:   [id number] DIOVAN 160 MG  TABS (VALSARTAN) Take 1/2  tablet by mouth once a day  #90 x 4   Entered and Authorized by:   Michele Mcalpine MD   Signed by:   Michele Mcalpine MD on 09/15/2009   Method used:   Print then Give to Patient   RxID:   [id number]     Flu Vaccine Consent Questions     Do you have a history of severe allergic reactions to this vaccine? no    Any prior history of allergic reactions to egg and/or gelatin? no    Do you have a  sensitivity to the preservative Thimersol? no    Do you have a past history of Guillan-Barre Syndrome? no    Do you currently have an acute febrile illness? no    Have you ever had a severe reaction to latex? no    Vaccine information given and explained to patient? yes    Are you currently pregnant? no    Lot Number:[id number]   Exp Date:07/04/2010   Site Given  Left Deltoid IMflu   Randell Loop CMA  September 15, 2009 10:22 AM

## 2010-02-19 ENCOUNTER — Encounter: Payer: Self-pay | Admitting: Pulmonary Disease

## 2010-03-03 NOTE — Letter (Signed)
Summary: Medical Clearance/[facility]  Medical Clearance/[facility]   Imported By: Lester [place] 02/23/2010 09:44:27  _____________________________________________________________________  External Attachment:    Type:   Image     Comment:   External Document

## 2010-03-21 LAB — POCT I-STAT 4, (NA,K, GLUC, HGB,HCT)
Hemoglobin: 16.7 g/dL (ref 13.0–17.0)
Potassium: 4.2 mEq/L (ref 3.5–5.1)
Sodium: 143 mEq/L (ref 135–145)

## 2010-03-25 ENCOUNTER — Ambulatory Visit (HOSPITAL_COMMUNITY)
Admission: RE | Admit: 2010-03-25 | Discharge: 2010-03-25 | Disposition: A | Discharge status: Home / Self Care | Payer: Medicare Other | Source: Ambulatory Visit | Attending: Orthopedic Surgery | Admitting: Orthopedic Surgery

## 2010-03-25 ENCOUNTER — Other Ambulatory Visit: Payer: Self-pay | Admitting: Orthopedic Surgery

## 2010-03-25 DIAGNOSIS — Z86718 Personal history of other venous thrombosis and embolism: Secondary | ICD-10-CM | POA: Insufficient documentation

## 2010-03-25 DIAGNOSIS — M79609 Pain in unspecified limb: Secondary | ICD-10-CM | POA: Insufficient documentation

## 2010-03-25 DIAGNOSIS — Z01818 Encounter for other preprocedural examination: Secondary | ICD-10-CM | POA: Insufficient documentation

## 2010-03-25 DIAGNOSIS — I8390 Asymptomatic varicose veins of unspecified lower extremity: Secondary | ICD-10-CM | POA: Insufficient documentation

## 2010-04-01 ENCOUNTER — Telehealth: Payer: Self-pay | Admitting: Pulmonary Disease

## 2010-04-01 NOTE — Telephone Encounter (Signed)
Called, spoke with pt.  States he has knee surgery scheduled for 04/14/10 but there is some concern regarding blood clots.  States Dr. Charlann Boxer spoke with Dr. Kriste Basque regarding this today and was told Dr. Kriste Basque wanted to do a test.  Pt would like to know what it is he needs to have done and would like this set up ASAP.  Dr. Kriste Basque, pls advise.  Thanks!

## 2010-04-01 NOTE — Telephone Encounter (Signed)
Pt returned call from leigh. Per leigh, i advised pt that he has an appt scheduled with dr Kriste Basque for 04/07/10 at 2:30. Pt was upset- says he goes to hosp Monday to have labs / pre-admit and doesn't know why he needs to come in and have labs done again. His surgery is scheduled for 4/10 and he fears that this won't allow "enough time" before his surgery. Wants nurse to call him back tomorrow. Tivis Ringer

## 2010-04-01 NOTE — Telephone Encounter (Signed)
lmomtcb for pt---needs ov with SN prior to knee surgery and to set up for labs that day.  Can add pt on for 4-3 at 2:30.

## 2010-04-02 NOTE — Telephone Encounter (Signed)
Pt returning call can be reached at [phone].

## 2010-04-02 NOTE — Telephone Encounter (Signed)
Called and spoke with pt and he is aware of appt needed on 4-3 at 2:30 and that we will do labs at this ov.  He stated that he is going for pre-op labs on Monday and wanted to make sure that we are not going to repeat the same labs due to insurance reasons.  Explained to the pt that we would not repeat the same labs already done. Pt voiced his understanding

## 2010-04-06 ENCOUNTER — Encounter (HOSPITAL_COMMUNITY): Payer: Medicare Other

## 2010-04-06 ENCOUNTER — Other Ambulatory Visit: Payer: Self-pay | Admitting: Orthopedic Surgery

## 2010-04-06 ENCOUNTER — Other Ambulatory Visit (HOSPITAL_COMMUNITY): Payer: Self-pay | Admitting: Orthopedic Surgery

## 2010-04-06 ENCOUNTER — Ambulatory Visit (HOSPITAL_COMMUNITY)
Admission: RE | Admit: 2010-04-06 | Discharge: 2010-04-06 | Disposition: A | Discharge status: Home / Self Care | Payer: Medicare Other | Source: Ambulatory Visit | Attending: Orthopedic Surgery | Admitting: Orthopedic Surgery

## 2010-04-06 DIAGNOSIS — M1712 Unilateral primary osteoarthritis, left knee: Secondary | ICD-10-CM

## 2010-04-06 DIAGNOSIS — Z01810 Encounter for preprocedural cardiovascular examination: Secondary | ICD-10-CM | POA: Insufficient documentation

## 2010-04-06 DIAGNOSIS — Z01812 Encounter for preprocedural laboratory examination: Secondary | ICD-10-CM | POA: Insufficient documentation

## 2010-04-06 DIAGNOSIS — I517 Cardiomegaly: Secondary | ICD-10-CM | POA: Insufficient documentation

## 2010-04-06 DIAGNOSIS — E119 Type 2 diabetes mellitus without complications: Secondary | ICD-10-CM | POA: Insufficient documentation

## 2010-04-06 DIAGNOSIS — Z01818 Encounter for other preprocedural examination: Secondary | ICD-10-CM | POA: Insufficient documentation

## 2010-04-06 DIAGNOSIS — I10 Essential (primary) hypertension: Secondary | ICD-10-CM | POA: Insufficient documentation

## 2010-04-06 LAB — DIFFERENTIAL
Basophils Relative: 0 % (ref 0–1)
Lymphocytes Relative: 18 % (ref 12–46)
Lymphs Abs: 2.1 10*3/uL (ref 0.7–4.0)
Monocytes Relative: 13 % — ABNORMAL HIGH (ref 3–12)
Neutro Abs: 7.5 10*3/uL (ref 1.7–7.7)
Neutrophils Relative %: 67 % (ref 43–77)

## 2010-04-06 LAB — BASIC METABOLIC PANEL
Calcium: 9.7 mg/dL (ref 8.4–10.5)
Creatinine, Ser: 1.13 mg/dL (ref 0.4–1.5)
GFR calc non Af Amer: >60 mL/min (ref >60)
Glucose, Bld: 102 mg/dL — ABNORMAL HIGH (ref 70–99)
Potassium: 4.5 mEq/L (ref 3.5–5.1)
Sodium: 142 mEq/L (ref 135–145)

## 2010-04-06 LAB — URINALYSIS, ROUTINE W REFLEX MICROSCOPIC
Nitrite: NEGATIVE
Protein, ur: 100 mg/dL — AB
Specific Gravity, Urine: 1.031 — ABNORMAL HIGH (ref 1.005–1.030)
Urobilinogen, UA: 1 mg/dL (ref 0.0–1.0)
pH: 5.5 (ref 5.0–8.0)

## 2010-04-06 LAB — CBC
HCT: 49.5 % (ref 39.0–52.0)
Hemoglobin: 15.9 g/dL (ref 13.0–17.0)
MCH: 30.3 pg (ref 26.0–34.0)
MCHC: 32.1 g/dL (ref 30.0–36.0)
Platelets: 299 10*3/uL (ref 150–400)
RDW: 12.9 % (ref 11.5–15.5)
WBC: 11.2 10*3/uL — ABNORMAL HIGH (ref 4.0–10.5)

## 2010-04-06 LAB — URINE MICROSCOPIC-ADD ON

## 2010-04-06 LAB — PROTIME-INR: Prothrombin Time: 13.4 seconds (ref 11.6–15.2)

## 2010-04-07 ENCOUNTER — Other Ambulatory Visit (INDEPENDENT_AMBULATORY_CARE_PROVIDER_SITE_OTHER): Payer: Medicare Other

## 2010-04-07 ENCOUNTER — Ambulatory Visit (INDEPENDENT_AMBULATORY_CARE_PROVIDER_SITE_OTHER): Payer: Medicare Other | Admitting: Pulmonary Disease

## 2010-04-07 ENCOUNTER — Encounter: Payer: Self-pay | Admitting: Pulmonary Disease

## 2010-04-07 ENCOUNTER — Other Ambulatory Visit: Payer: Self-pay | Admitting: Pulmonary Disease

## 2010-04-07 DIAGNOSIS — K5730 Diverticulosis of large intestine without perforation or abscess without bleeding: Secondary | ICD-10-CM

## 2010-04-07 DIAGNOSIS — E119 Type 2 diabetes mellitus without complications: Secondary | ICD-10-CM

## 2010-04-07 DIAGNOSIS — I82890 Acute embolism and thrombosis of other specified veins: Secondary | ICD-10-CM

## 2010-04-07 DIAGNOSIS — M1990 Unspecified osteoarthritis, unspecified site: Secondary | ICD-10-CM

## 2010-04-07 DIAGNOSIS — E785 Hyperlipidemia, unspecified: Secondary | ICD-10-CM

## 2010-04-07 DIAGNOSIS — I10 Essential (primary) hypertension: Secondary | ICD-10-CM

## 2010-04-07 DIAGNOSIS — F419 Anxiety disorder, unspecified: Secondary | ICD-10-CM

## 2010-04-07 DIAGNOSIS — N200 Calculus of kidney: Secondary | ICD-10-CM

## 2010-04-07 DIAGNOSIS — E669 Obesity, unspecified: Secondary | ICD-10-CM

## 2010-04-07 DIAGNOSIS — F411 Generalized anxiety disorder: Secondary | ICD-10-CM

## 2010-04-07 MED ORDER — DICLOFENAC SODIUM 75 MG PO TBEC: 75.0000 mg | DELAYED_RELEASE_TABLET | Freq: Two times a day (BID) | ORAL | Status: DC

## 2010-04-07 NOTE — Patient Instructions (Signed)
Today we updated your med list...    We stopped the Mobic & replaced it w/ DICLOFENAC 75mg - one tab twice daily as needed for arthritis pain...  Today we did your additional blood work (we correlated our labs w/ the hosp pre-op labs) & included a "hypercoagulable panel" to check for any inherited tendency towards blood clots)...     We will call you with the results when available...  Good luck w/ your up-coming knee surgery & I feel that the usual DVT prophylaxis around the time of surgery should suffice in your case... Call for any problems... Let's plan a follow up visit in 6 months, sooner as needed.Marland KitchenMarland Kitchen

## 2010-04-08 LAB — HEPATIC FUNCTION PANEL
ALT: 50 U/L (ref 0–53)
Albumin: 4.3 g/dL (ref 3.5–5.2)
Alkaline Phosphatase: 61 U/L (ref 39–117)
Total Protein: 7.2 g/dL (ref 6.0–8.3)

## 2010-04-08 LAB — LIPID PANEL
Cholesterol: 125 mg/dL (ref 0–200)
LDL Cholesterol: 68 mg/dL (ref 0–99)
Triglycerides: 141 mg/dL (ref 0.0–149.0)
VLDL: 28.2 mg/dL (ref 0.0–40.0)

## 2010-04-10 LAB — HYPERCOAGULABLE PANEL, COMPREHENSIVE
AntiThromb III Func: 98 % (ref 76–126)
Anticardiolipin IgA: 2 APL U/mL (ref <22)
Beta-2-Glycoprotein I IgM: 4 M Units (ref <20)
Protein S Activity: 150 % — ABNORMAL HIGH (ref 69–129)

## 2010-04-11 ENCOUNTER — Encounter: Payer: Self-pay | Admitting: Pulmonary Disease

## 2010-04-11 NOTE — Assessment & Plan Note (Signed)
FLP looks good on his Simvastatin 40mg /d;  Needs wt reduction as noted & incr exercise to improve his HDL.Marland KitchenMarland Kitchen

## 2010-04-11 NOTE — Assessment & Plan Note (Signed)
He is sched for left TKR by DrOlin soon>  OK for surg.Marland KitchenMarland Kitchen

## 2010-04-11 NOTE — Assessment & Plan Note (Signed)
BP well controlled on his low dose Diovan Rx>  Continue same... Pre-op CXR shows borderline cardiomeg, & large osteophytes in the lower TSpine, lungs clear, NAD.Marland KitchenMarland Kitchen EKG shows NSR, LAD, poor R progression, NAD.Marland KitchenMarland Kitchen

## 2010-04-11 NOTE — Assessment & Plan Note (Signed)
As noted> wt 266# w/ BMI 38;  He has had considerable difficulty w/ diet + exercise in the past;  We reviewed diet recommendations & hopefully he will be able to exercise better after the knee surg.Marland KitchenMarland Kitchen

## 2010-04-11 NOTE — Assessment & Plan Note (Signed)
As noted- he has hx BPH/ BOO, Nephrolithiasis, & pre op labs w/ hematuria (prob from his stone dis) and UTI;  Already started on SeptraDS per DrOlin (note his allergy to Cipro).Marland KitchenMarland Kitchen

## 2010-04-11 NOTE — Assessment & Plan Note (Signed)
Gi hx includes GERD, divertics, hx polyps & hems;  Stable on Prevacid 30mg /d & Zantac 150mg  Qhs;  Continue same meds.Marland KitchenMarland Kitchen

## 2010-04-11 NOTE — Progress Notes (Signed)
Subjective:    Patient ID: [name], male    DOB: 1945/08/24, 65 y.o.   MRN: [id number]  HPI 65 y/o WM here for a follow up visit... he has multiple medical problems including:  HBP;  VV/ VI/ hx DVT;  Hyperlipidemia;  DM;  Obesity;  GERD/ Divertics/ Colon polyps/ Hems;  Gallstone;  BPH/ Bladder outlet obstruction;  Kidney stones;  DJD/ LBP;  Anxiety...  ~  September 15, 2009:  he's had a busy 1mo- left knee arthroscopy by DrSupple 2 wks ago & kidney stone surg DrPeterson 7/11 w/ cysto, ureteroscopy, laser frag & stent;  then f/u Urology 8/11 for this & his BPH, incr PSA & ED> he says PSA was "OK"...  BP is controlled on diovan;  FLP looks good on Simva40;  DM reasonable on his Metform+Glimep as he needs to lose weight to affect better control!...  daughter wants him to have Rheum consult;  he wants handicap sticker & 90d refills;  OK 2011 Flu vaccine.  ~  April 07, 2010:  79mo ROV & he is sched for left TKR by DrOlin soon;  Asked to check pt pre-op for hx DVT & assess risk w/ +FamHx> pt had DVT 2007 after back surg & was treated w/ Hep/ Coumadin- discontinued after 1 yr & no recurrence;  He subseq had broken ankle after a fall in 2009 w/ surg DrDuda & no prob in the post op period;  Presented w/ some leg swelling in 2010 & Ven Dopplers were neg for DVT, treated w/ sodium restriction, elevation, support hose;  He had left knee arthroscopy 8/11 by DrSupple followed by 5d Lovenox Rx & no prob w/ that approach;  As far as the [facility] is concerned- Mother is the only family member w/ blood clots (she is still alive at 13- hx DM improved w/ wt reduction, DJD, Gout, VV, and hx DVT in past on Coumadin for yrs);  NOTE> Ven Dopplers done 03/25/10 showed +VV, no DVT;  His risk is elev due to his prev hx but not extreme & we will check Hypercoag Panel for completeness> he should be OK w/ the standard DVT prophylaxis after knee surg...    HBP>  On Diovan 160mg tabs- 1/2 tab daily + low sodium/ wt reducing diet;  BP=  120/70 & similar at home, tol med well & denies CP/ palpit/ dizzy/ SOB/ incr edema etc...    Chol>  On Simva40 & FLP shows TChol 125, TG 141, HDL 29, LDL 68;  Good control on med but needs better low fat diet & incr exercise...    DM>  On Metform500Bid + Glimep1mg  Qam;  FBS=102 (pre-op labs) & A1c=7.2> pt rec to continue same meds & get on better low carb, wt reducing diet...    Obese>  His weight is 266#, 70" tall, BMI= 38;  We have discussed diet & exercise w/ each office visit but he's had difficulty w/ compliance due to his arthritis & hopefully the TKR will allow for more vigorous exercise program...    GI>  Hx GERD, Divertics, polyps> last colon 2010 by [name] was neg & f/u planned 61yrs;  He takes Prevacid 30mg /d, and Zantac 150mg  Qhs...    GU>  Long hx kidney stones followed by DrPeterson & [place];  Pre-op labs showed UTI & he's been started on SeptraDS, NOTE- he is very allergic to Cipro;  Labs showed BUN=19, Creat=1.13    Past Medical History  Diagnosis Date  . Anxiety   .  Low back pain syndrome   . DJD (degenerative joint disease)   . Nephrolithiasis   . BPH (benign prostatic hypertrophy) with urinary obstruction   . Cholelithiasis   . Hemorrhoids   . History of colonic polyps   . Diverticulosis of colon   . GERD (gastroesophageal reflux disease)   . Diabetes mellitus   . Hyperlipidemia   . History of DVT (deep vein thrombosis)   . Venous insufficiency   . Varicose veins of lower extremities   . Hypertension, benign   . pulmonary nodule, left lower lobe     Past Surgical History  Procedure Date  . Sebaceous cyst surgery   . Bilateral inguinal hernia repairs   . T1-t2 disc surgery 2007    by Dr. Otelia Sergeant  . Left ankle surgery with plate and screws 11/2007    by Dr. Lajoyce Corners    Outpatient Encounter Prescriptions as of 04/07/2010  Medication Sig Dispense Refill  . aspirin 81 MG tablet Take 81 mg by mouth daily.        . cetirizine (ZYRTEC) 10 MG tablet Take 10 mg by  mouth daily.        [name] glimepiride (AMARYL) 1 MG tablet Take 1 mg by mouth daily before breakfast.        . lansoprazole (PREVACID) 30 MG capsule Take 30 mg by mouth daily.        . metFORMIN (GLUCOPHAGE) 500 MG tablet Take 500 mg by mouth 2 (two) times daily with a meal.        . multivitamin (THERAGRAN) per tablet Take 1 tablet by mouth daily.        . ranitidine (ZANTAC) 150 MG capsule Take 150 mg by mouth at bedtime.        . simvastatin (ZOCOR) 40 MG tablet Take 40 mg by mouth at bedtime.        . valsartan (DIOVAN) 160 MG tablet Take 160 mg by mouth daily. Take 1/2 tablet by daily       . diclofenac (VOLTAREN) 75 MG EC tablet Take 1 tablet (75 mg total) by mouth 2 (two) times daily with a meal.  60 tablet  5  . mometasone (NASONEX) 50 MCG/ACT nasal spray 2 sprays by Nasal route at bedtime.        [name] DISCONTD: diclofenac (VOLTAREN) 75 MG EC tablet Take 1 tablet (75 mg total) by mouth 2 (two) times daily with a meal.  60 tablet  2  . DISCONTD: meloxicam (MOBIC) 7.5 MG tablet Take 7.5 mg by mouth daily. As needed for arthritis pain         Allergies  Allergen Reactions  . Ciprofloxacin     REACTION: rash  . Niacin     REACTION: hypertension, shaky    Review of Systems        See HPI - all other systems neg except as noted...       The patient complains of difficulty walking.  The patient denies anorexia, fever, weight loss, weight gain, vision loss, decreased hearing, hoarseness, chest pain, syncope, ch in dyspnea on exertion, peripheral edema, prolonged cough, headaches, hemoptysis, abdominal pain, melena, hematochezia, severe indigestion/heartburn, hematuria, incontinence, muscle weakness, suspicious skin lesions, transient blindness, depression, unusual weight change, abnormal bleeding, enlarged lymph nodes, and angioedema.     Objective:   Physical Exam     WD, Obese, 65 y/o WM in NAD... GENERAL:  Alert & oriented; pleasant & cooperative... HEENT:  [place]/AT, EOM-wnl, PERRLA,  EACs-clear, TMs-wnl,  NOSE-clear, THROAT-clear & wnl. NECK:  Supple w/ fair ROM; no JVD; normal carotid impulses w/o bruits; no thyromegaly or nodules palpated; no lymphadenopathy. CHEST:  Clear to P & A; without wheezes/ rales/ or rhonchi. HEART:  Regular Rhythm; without murmurs/ rubs/ or gallops. ABDOMEN:  Obese, soft & nontender; normal bowel sounds; no organomegaly or masses detected. EXT: without deformities, mod arthritic changes & decr ROM left knee; +varicose veins/ +venous insuffic/ tr edema. NEURO:  CN's intact; motor testing normal; sensory testing normal; gait normal & balance OK. DERM:  no rash etc...   Assessment & Plan:

## 2010-04-11 NOTE — Assessment & Plan Note (Signed)
Stable on Metformin 500mg  Bid & Glimepiride 1mg  Qam;  FBS= 102 is good, A1c= 7.2 sl elev & should improve w/ better low carb diet efforts & wt reduction.Marland KitchenMarland Kitchen

## 2010-04-11 NOTE — Assessment & Plan Note (Signed)
SEE HPI>  Hx DVT after back surg 2007 but no prob since then;  Doubt hypercoag state & will check labs for completeness ==> all pending;  His perioperative risk is incr based on his prev hx & obesity etc> so would rec standard post op preventive orders & include PAS hose.Marland KitchenMarland Kitchen

## 2010-04-12 LAB — GLUCOSE, CAPILLARY
Glucose-Capillary: 139 mg/dL — ABNORMAL HIGH (ref 70–99)
Glucose-Capillary: 169 mg/dL — ABNORMAL HIGH (ref 70–99)

## 2010-04-14 ENCOUNTER — Inpatient Hospital Stay (HOSPITAL_COMMUNITY)
Admission: RE | Admit: 2010-04-14 | Discharge: 2010-04-16 | DRG: 470 | Disposition: A | Discharge status: Home Health | Payer: Medicare Other | Source: Ambulatory Visit | Attending: Orthopedic Surgery | Admitting: Orthopedic Surgery

## 2010-04-14 DIAGNOSIS — I2510 Atherosclerotic heart disease of native coronary artery without angina pectoris: Secondary | ICD-10-CM | POA: Diagnosis present

## 2010-04-14 DIAGNOSIS — Z86718 Personal history of other venous thrombosis and embolism: Secondary | ICD-10-CM

## 2010-04-14 DIAGNOSIS — M1710 Unilateral primary osteoarthritis, unspecified knee: Principal | ICD-10-CM | POA: Diagnosis present

## 2010-04-14 DIAGNOSIS — Z01812 Encounter for preprocedural laboratory examination: Secondary | ICD-10-CM

## 2010-04-14 DIAGNOSIS — E119 Type 2 diabetes mellitus without complications: Secondary | ICD-10-CM | POA: Diagnosis present

## 2010-04-14 DIAGNOSIS — E669 Obesity, unspecified: Secondary | ICD-10-CM | POA: Diagnosis present

## 2010-04-14 DIAGNOSIS — E7800 Pure hypercholesterolemia, unspecified: Secondary | ICD-10-CM | POA: Diagnosis present

## 2010-04-14 LAB — URINE MICROSCOPIC-ADD ON

## 2010-04-14 LAB — BASIC METABOLIC PANEL
BUN: 26 mg/dL — ABNORMAL HIGH (ref 6–23)
CO2: 24 mEq/L (ref 19–32)
Chloride: 104 mEq/L (ref 96–112)
Creatinine, Ser: 1.5 mg/dL (ref 0.4–1.5)
Glucose, Bld: 157 mg/dL — ABNORMAL HIGH (ref 70–99)
Potassium: 4.3 mEq/L (ref 3.5–5.1)

## 2010-04-14 LAB — URINALYSIS, ROUTINE W REFLEX MICROSCOPIC
Glucose, UA: NEGATIVE mg/dL
Ketones, ur: 15 mg/dL — AB
Protein, ur: 100 mg/dL — AB
Urobilinogen, UA: 1 mg/dL (ref 0.0–1.0)

## 2010-04-14 LAB — URINE CULTURE: Culture: NO GROWTH

## 2010-04-14 LAB — CBC
HCT: 51.5 % (ref 39.0–52.0)
MCHC: 33.5 g/dL (ref 30.0–36.0)
MCV: 91.3 fL (ref 78.0–100.0)
Platelets: 262 10*3/uL (ref 150–400)
RDW: 12.4 % (ref 11.5–15.5)
WBC: 9.3 10*3/uL (ref 4.0–10.5)

## 2010-04-14 LAB — GLUCOSE, CAPILLARY: Glucose-Capillary: 125 mg/dL — ABNORMAL HIGH (ref 70–99)

## 2010-04-14 LAB — DIFFERENTIAL
Basophils Relative: 1 % (ref 0–1)
Eosinophils Absolute: 0.2 10*3/uL (ref 0.0–0.7)
Eosinophils Relative: 2 % (ref 0–5)
Lymphs Abs: 1.7 10*3/uL (ref 0.7–4.0)
Neutrophils Relative %: 69 % (ref 43–77)

## 2010-04-14 LAB — TYPE AND SCREEN: ABO/RH(D): A POS

## 2010-04-15 LAB — CBC
Hemoglobin: 13.1 g/dL (ref 13.0–17.0)
MCH: 29.8 pg (ref 26.0–34.0)
MCV: 92.9 fL (ref 78.0–100.0)
RBC: 4.39 MIL/uL (ref 4.22–5.81)

## 2010-04-15 LAB — BASIC METABOLIC PANEL
BUN: 16 mg/dL (ref 6–23)
CO2: 24 mEq/L (ref 19–32)
Chloride: 105 mEq/L (ref 96–112)
Creatinine, Ser: 0.98 mg/dL (ref 0.4–1.5)

## 2010-04-15 LAB — GLUCOSE, CAPILLARY
Glucose-Capillary: 196 mg/dL — ABNORMAL HIGH (ref 70–99)
Glucose-Capillary: 144 mg/dL — ABNORMAL HIGH (ref 70–99)
Glucose-Capillary: 148 mg/dL — ABNORMAL HIGH (ref 70–99)

## 2010-04-16 LAB — BASIC METABOLIC PANEL
BUN: 9 mg/dL (ref 6–23)
GFR calc Af Amer: >60 mL/min (ref >60)
GFR calc non Af Amer: >60 mL/min (ref >60)
Potassium: 5.4 mEq/L — ABNORMAL HIGH (ref 3.5–5.1)
Sodium: 137 mEq/L (ref 135–145)

## 2010-04-16 LAB — PROTIME-INR: Prothrombin Time: 14.5 seconds (ref 11.6–15.2)

## 2010-04-16 LAB — CBC
Platelets: 239 10*3/uL (ref 150–400)
RDW: 12.9 % (ref 11.5–15.5)
WBC: 11.1 10*3/uL — ABNORMAL HIGH (ref 4.0–10.5)

## 2010-04-16 NOTE — Op Note (Signed)
NAMECALVIN, [name]                 ACCOUNT NO.:  [id number]  MEDICAL RECORD NO.:  [id number]           PATIENT TYPE:  I  LOCATION:  1615                         FACILITY:  [facility]  PHYSICIAN:  Madlyn Frankel. Charlann Boxer, M.D.  DATE OF BIRTH:  February 29, 1945  DATE OF PROCEDURE:  04/14/2010 DATE OF DISCHARGE:                              OPERATIVE REPORT   PREOPERATIVE DIAGNOSIS:  Left knee osteoarthritis.  POSTOPERATIVE DIAGNOSIS:  Left knee osteoarthritis.  PROCEDURE:  Left total knee replacement.  COMPONENTS USED:  DePuy rotating platform posterior stabilized knee system with a size 5 femur, 4 tibia, and a 12.5 insert with a 41 patellar button.  SURGEON:  Madlyn Frankel. Charlann Boxer, M.D.  ASSISTANT:  Jaquelyn Bitter. Chabon, P.A.  ANESTHESIA:  Spinal.  SPECIMENS:  None.  COMPLICATIONS:  None.  BLOOD LOSS:  About 100 cc.  DRAINS:  One Hemovac.  TOURNIQUET TIME:  41 minutes at 250 mmHg.  INDICATIONS FOR PROCEDURE:  Mr. Larsh is a 65 year old male, seen and evaluated in my office for right knee arthritis greater than left with progressive discomfort and pain, significant reduction in overall quality of her life, and at this point was ready to proceed with more definitive measures.  She had previous arthroscopic surgery.  Risks of infection, DVT, component failure, need for revision surgery all discussed and reviewed, particular focus was on family history of blood clot and pulmonary embolus.  Questions were encouraged, answered and reviewed preoperatively.  Surgery was scheduled.  Consent obtained.  PROCEDURE IN DETAIL:  The patient was brought to operative theater. Once adequate anesthesia, preoperative antibiotics, Ancef administered, the patient was positioned supine with left thigh tourniquet placed. Left lower extremity was then prepped and draped in a sterile fashion. Time-out was performed identifying the patient, planned procedure, and the extremity.  Left leg was placed in [facility] leg  holder.  Leg was exsanguinated, tourniquet elevated to 250 mmHg.  Midline incision was made followed by medial arthrotomy.  Following initial exposure and debridement, attention was directed to the patella.  Precut measurement was 27 mm.  I resected down to about 15 mm, used a 41 patellar button to restore height and also to cover the cut surface. Lug holes were drilled and metal shim was placed to protect the patella from retractors and saw blades.  At this point, the attention was now directed to femur.  Femoral canal was opened with drill, irrigated to try to prevent fat emboli.  An intramedullary rod was passed and at 5 degrees of valgus, 10 mm of bone was resected off the distal femur.  The tibia was then subluxated anteriorly using extramedullary guide and I measured resection of the proximal lateral tibia of 10 mm.  I then checked to confirm the knee was going to come to full extension with at least 10 mm insert.  We then confirmed that the cut was perpendicular in the coronal plane.  Once this was done, we sized the femur to be a size 5 in an anterior- posterior dimension with size 5 rotation block was then pinned into position, anterior referenced using a C clamp to  set rotation.  The 4-in- 1 cutting block was then pinned into position.  The anterior, posterior, and chamfer cuts were made without difficulty or notching.  Final box cut was made off the lateral aspect of distal femur.  The tibia was subluxated anteriorly and using the cut surface, the best fit with size 4 tibial tray particularly lateralizing and removing some medial osteophytes.  It was pinned into position, drilled, and keel punched.  Trial reduction was carried out with 5 femur, 4 tibia and initially 10-mm insert but I chose to use a 12.5 based on improved symmetric stability.  The patella tracked through the trochlea without application pressure.  At this point, trial components were removed.  Final  components were opened and cement mixed.  The knee was injected with 0.25% Marcaine with epinephrine and 1 cc of Toradol and the knee irrigated with normal saline solution.  The final components were then cemented on to clean and dried cut surface of the bone with the knee brought to extension with a 12.5 insert.  Extruded cement was removed.  Once the cement cured and excessive cement was removed throughout the knee, the final 12.5 insert was chosen.  The tourniquet had been let down after 41 minutes.  The medium Hemovac drain was placed deep.  The extensor mechanism was then reapproximated using #1 Vicryl with the knee in flexion.  The remainder of the wound was closed with 2-0 Vicryl and running 4-0 Monocryl.  The knee was cleaned, dried, and dressed sterilely using Aquacel dressing, the drain site was dressed separately.     Madlyn Frankel Charlann Boxer, M.D.     MDO/MEDQ  D:  04/14/2010  T:  04/15/2010  Job:  [id number]  Electronically Signed by Durene Romans M.D. on 04/16/2010 10:07:27 AM

## 2010-04-17 ENCOUNTER — Telehealth: Payer: Self-pay | Admitting: Pulmonary Disease

## 2010-04-17 NOTE — Telephone Encounter (Signed)
Pt aware to call the phone tree for lab results. SN left results on the phone tree.

## 2010-04-20 ENCOUNTER — Telehealth: Payer: Self-pay | Admitting: Pulmonary Disease

## 2010-04-20 NOTE — Telephone Encounter (Signed)
Spoke w/ pt and he states he called the phone tree and Friday and received his lab results. Pt states he wants the results of the "big lab test" Dr. Kriste Basque ordered for him to have. Pt couldn't identify what the test was called. Pt states he had 17 vials of blood drawn and wants to know why he had this many drawn if the results he heard on phone tree was normal. Please advise Dr. Kriste Basque. Thanks  .

## 2010-04-20 NOTE — Telephone Encounter (Signed)
SN called and spoke with pt about his lab results

## 2010-04-21 NOTE — Discharge Summary (Signed)
NAMELARIN, WEISSBERG                 ACCOUNT NO.:  [id number]  MEDICAL RECORD NO.:  [id number]           PATIENT TYPE:  I  LOCATION:  1615                         FACILITY:  [facility]  PHYSICIAN:  Madlyn Frankel. Charlann Boxer, M.D.  DATE OF BIRTH:  July 05, 1945  DATE OF ADMISSION:  04/14/2010 DATE OF DISCHARGE:  04/16/2010                              DISCHARGE SUMMARY   ADMITTING DIAGNOSIS:  Left knee osteoarthritis.  DISCHARGE DIAGNOSES: 1. Left knee osteoarthritis status post left total knee replacement. 2. Coronary artery disease. 3. Family history of deep vein thrombosis. 4. History of diabetes. 5. Hypercholesterolemia.  PRIMARY CARE PHYSICIAN:  Lonzo Cloud. Kriste Basque, MD  ADMITTING HISTORY:  [name] is a 65 year old gentleman who presented to the office for advanced left knee osteoarthritis.  He had failed conservative measures and wished to proceed with left knee replacement. He was identified to have family history concerning for blood clots as well as diabetes.  Risks of infection in the setting of diabetes as well as blood clot were discussed, in addition to standard risk of component failure, postop course expectations were all reviewed.  Consent was obtained for the above.  HOSPITAL COURSE:  The patient was admitted for same-day surgery on April 14, 2010.  Please see dictated operative note for full details of the procedure.  Postoperatively, he was transferred to the recovery room and then to orthopedic ward where he remained for first 2-day hospital stay. He was placed on Lovenox and Coumadin for DVT prophylaxis.  On postop day #1, he had his Foley catheter and Hemovac drains removed.  He was seen and evaluated by Physical Therapy.  By postop day #2, the patient has been on a carb-modified diet and done well with physical therapy. His INR was only 1.1 on day #2, date of discharge.  His electrolytes remained stable.  His potassium was slightly elevated, I did not treat it.  His  hematocrit remains at 37.7 with no change in platelets.  CONDITION ON DISCHARGE:  The patient stable at the time of discharge, ready to be discharged on postoperative day #2.  DISCHARGE INSTRUCTIONS:  The patient is going to be discharged to home. He has got an Aquacel dressing that will remain in place for 8 days.  He is to keep his wound dry.  He may shower with the dressing in place.  He will be seen and evaluated by [facility], Physical Therapy as well as nursing for management of his Coumadin, try to maintain an INR level between 2 and 2.5.  He will be on Lovenox until his INR is greater than 2 for bridging purposes.  He will return to see Dr. Durene Romans at [facility] at [phone] in 2 weeks.  DISCHARGE MEDICATIONS:  He will be discharged home on: 1. Coumadin at this point 7.5 mg daily until further directed by home     health agency. 2. Colace 100 mg p.o. b.i.d. while on pain medicine for constipation. 3. Lovenox 40 mg subcu daily until his INR is greater than 2. 4. Iron 325 mg for 1 week only. 5. Norco 7/325 one to  two tablets every 4 to 6 hours as needed for     pain. 6. Robaxin 500 mg p.o. q.6 h as needed for muscle spasm and pain. 7. MiraLax 17 g p.o. daily as needed for constipation. 8. Aspirin 81 mg daily. 9. Diclofenac as needed. 10.Diovan 160 mg 1/2 tablet q.a.m. 11.Glimepiride 1 mg q.a.m. 12.Lansoprazole 30 mg q.a.m. 13.Metformin 500 mg b.i.d. 14.Multivitamin p.o. daily. 15.Zantac 150 mg daily as needed. 16.Saline nasal spray as needed at night. 17.Simvastatin 40 mg q.h.s. 18.Vitamin E over-the-counter daily. 19.Zyrtec over-the-counter as needed.     Madlyn Frankel Charlann Boxer, M.D.     MDO/MEDQ  D:  04/16/2010  T:  04/16/2010  Job:  [id number]  cc:   Lonzo Cloud. Kriste Basque, MD [address] [place] [address]  Electronically Signed by Durene Romans M.D. on 04/21/2010 07:41:53 AM

## 2010-04-24 ENCOUNTER — Encounter: Payer: Self-pay | Admitting: Pulmonary Disease

## 2010-05-19 ENCOUNTER — Telehealth: Payer: Self-pay | Admitting: Pulmonary Disease

## 2010-05-19 MED ORDER — AZITHROMYCIN 250 MG PO TABS: ORAL_TABLET | ORAL | Status: DC

## 2010-05-19 NOTE — Telephone Encounter (Signed)
Zpack #1 take as directed. (no refills )  Mucinex DM Twice daily  As needed  Cough/congestion  Saline nasal rinses As needed   Claritin daily As needed  Drainage.  Fluids and rest  Please contact office for sooner follow up if symptoms do not improve or worsen or seek emergency care

## 2010-05-19 NOTE — Telephone Encounter (Signed)
Spoke with pt. He is c/o dry cough x 3 days, sinus pressure and drainage. Denies other complaints. Would like rxs called to pharm. Pls advise thanks! Allergies  Allergen Reactions  . Ciprofloxacin     REACTION: rash  . Niacin     REACTION: hypertension, shaky

## 2010-05-19 NOTE — Telephone Encounter (Signed)
Pt aware of TP's recs zpack sent to pharmacy.

## 2010-05-19 NOTE — Assessment & Plan Note (Signed)
[facility]                               LIPID CLINIC NOTE   NAME:[name], [name]                   MRN:          [id number]  DATE:08/15/2006                            DOB:          04-Jul-1945    DOCUMENTATION NOTE FOR THE ANTICOAGULATION CLINIC:   NOTE:  This is to document a telephone conversation.   The patient has a primary care physician, Feliberto Gottron, supervising  physician is Rollene Rotunda, M.D.   The patient is seen in the anticoagulation clinic for long term  anticoagulation management associated with his history of DVT.  He is  scheduled by [facility] to undergo epidural injection on  August 22, 2006.  He has been instructed to hold his Coumadin five days  prior in order to have that epidural.  I had discussed this patient with  Dr. Kriste Basque on the afternoon of August 12, 2006.  The patient is at an  acceptable risk to hold his Coumadin four days or five days taking care  to maintain physical activity as per normal or as directed by [facility] and to call with any swelling of the lower extremities,  shortness of breath, etc.  The patient will hold his Coumadin beginning  on August 17, 2006 and recheck his INR on the morning of August 22, 2006  prior to his planned epidural.  Starting in the evening of August 22, 2006, providing there are no complications and other instructions from  the [facility]' physician, the patient understands that he is  to take 10 mg four three days on August 18, 19 and 20, 2008, and then  follow up with an anticoagulation clinic appointment on August 29, 2006.   This has been discussed with the patient at length on August 15, 2006 at  10 o'clock in the morning.  The patient will call with questions or  problems in the meantime.      Shelby Dubin, PharmD, BCPS, CPP  Electronically Signed      Rollene Rotunda, MD, [facility]  Electronically Signed   MP/MedQ  DD: 08/16/2006   DT: 08/18/2006  Job #: [id number]

## 2010-05-22 NOTE — Op Note (Signed)
[name], [name] NO.:  [id number]   MEDICAL RECORD NO.:  [id number]          PATIENT TYPE:  INP   LOCATION:  2550                         FACILITY:  [facility]   PHYSICIAN:  Kerrin Champagne, M.D.   DATE OF BIRTH:  1945-08-06   DATE OF PROCEDURE:  02/09/2005  DATE OF DISCHARGE:                                 OPERATIVE REPORT   PREOPERATIVE DIAGNOSIS:  Left-sided T1-T2 lateral disc herniation causing  left T1 radiculopathy.   POSTOPERATIVE DIAGNOSIS:  Left-sided T1-T2 lateral disc herniation causing  left T1 radiculopathy.   PROCEDURE:  Left T1-T2 laminoforaminoplasty with excision of HNP.   SURGEON:  Kerrin Champagne, M.D.   ASSISTANT:  Wende Neighbors, P.A.-C.   ANESTHESIA:  General via orotracheal intubation, Judie Petit, M.D.   ESTIMATED BLOOD LOSS:  75 mL.   DRAINS:  None.   BRIEF CLINICAL HISTORY:  The patient is a 65 year old right-hand dominant  male experiencing severe neck pain, radiation to left medial forearm into  his forth and fifth digits.  Underwent MRI study which demonstrated some  mild cervical stenosis C4-5, C5-6 but disc protrusion at the T1-2 level.  He  underwent a left-sided EMG and nerve conduction study which demonstrated a  CAT-1 radiculopathy with acute necrotic denervation changes consistent with  lower cervical upper dorsal radiculopathy.  No sign of upper cervical  radiculopathy.  He had findings consistent with a T1 radiculopathy. He is  brought to the operating room to undergo left-sided T1-T2  laminoforaminoplasty with excision of lateral disc protrusion as it is in  the near foraminal position can be excised without significant risk to the  cord.   DESCRIPTION OF PROCEDURE:  After adequate general anesthesia, the patient in  the prone position, chest rolls, using the [place] horseshoe, well padded,  chin padded also.  Had tape placed over the upper dorsal region to apply  some skin traction to smooth out any folds  at the cervical dorsal junction.  The patient had a large hump at the cervical dorsal junction posteriorly.  He was a large individual.  Because of this, C-arm fluoroscopy was necessary  for localization.  Standard preoperative antibiotics, Ancef, standard prep  with DuraPrep solution, draped in the usual manner.  Iodine Vi-Drape was  used.  Incision of the midline at the expected C7-T1 level and extended  cranially an additional one level to expected C6 level.  Incision was made  on either side of spinous process at the expected C6 and clamp placed on  this level.  Intraoperative C-arm fluoroscopy used and with some difficulty,  eventually a spot view was obtained which demonstrated the C2 through C7  spinous processes with the clamp on the C6 spinous process.  This area was  marked for continued identification with 0 Vicryl suture.  Counting down  from this level, then T1 and T2 were identified.  The incision was extended  caudally an additional one inch.  Deep layers divided using electrocautery  down to the cervical dorsal fascia overlying the spinous process of T1-T2.  These were then incised on  the left side of the spinous process preserving  the interspinous ligament. Cobbs then used to elevate the paradorsal muscles  off the T1-2 left side exposing the posterior aspect of the lamina of T1-T2  and the posterior aspect of the interlaminar space.  This was carried out  lateral to the edge of the facet at the L1-L2 level.  Bleeders controlled  using bipolar electrocautery.  A Boss  McCullough retractor inserted.  High  speed bur then used to carefully thin the left side lamina of T1 and a  partial hemilaminectomy was carried out using Leksell rongeur and high speed  bur up to near the insertion of the ligamentum flavum here.  Additionally  medial inferior articular process of T1 was carefully thinned medially and  approximately 40% of the medial aspect of this facet was resected  exposing  the superior articular process of T2 and this was thinned as was the  superior portion of the lamina of T2.  A 1 mm Kerrison will be introduced  over the superior aspect of the lamina of the T2 used to remove a small  portion of the superior aspect of lamina of T2 on the left side identifying  the pedicle to T2 on the lateral aspect of the dorsal canal.  The superior  articular process of T2 was then resected using 1 or 2 mm Kerrisons and the  neural foramen identified as well as the T1 nerve root.  Ligamentum flavum  was then elevated with nerve hook and then small epidural veins cauterized  and this was then resected from the left T1-T2 interspace.  The leash of  epidural veins identified over the left side of the thecal sac. These were  elevated carefully with microtitanium nerve hook and cauterized and the  microvascular leash overlying the left T1 nerve root was cauterized and  divided.  A 3.0 microcurette was then used to carefully remove a small  portion of the superior medial aspect of the pedicle of T2 in order to  further expose the axillary portion of the T1 nerve root.  Bleeders  controlled using bipolar electrocautery, thrombin soaked Gelfoam and small  pledgets.  When complete hemostasis was obtained, nerve hook was introduced  beneath the T1 nerve root.  It was used to retract the nerve root superiorly  and the disk protrusion identified just at the level of the nerve root and  beneath the nerve root's entry into the thecal sac.  An 11 blade scalpel was  used to incise posterior longitudinal ligament on the left side and  microtitanium nerve root used to tease this material from the herniated  position and the lateral position of the spinal canal removing it in three  separate pieces.  No further disc material remained.  The nerve root  appeared to be quite free.  Patient was given a total of 12 mg of Decadron at this point IV.  Irrigation was performed.  Careful  hemostasis supplying  bone wax to bleeding cancellous bone surfaces, Gelfoam thrombin soaked, and  then this was removed.  Small epidural veins cauterized using bipolar  electrocautery.  There was no further active bleeding present at the end of  the procedure.  Irrigation was performed one last time and the careful  inspection following removal of the self-retaining retractor, a Boss  retractor, demonstrated some small bleeding from the paradorsal muscles  where they had previously inserted a lamina and these were controlled using  electrocautery and bipolar electrocautery.  When  the incision was completely  dry, then the dorsal and paradorsal fascia was then approximated to the  midline structures and ligamentum nuchae with interrupted 0 Ethibond  sutures.  Deep subcu layers approximated with interrupted #1 Vicryl sutures,  more superficial deep layers with interrupted 0 Vicryl sutures and then the  subcu fatty layers with interrupted 2-0 Vicryl sutures and the skin closed  with running subcu stitch of 4-0 Vicryl.  Tincture of Benzoin and Steri-  Strips applied.  The 4x4s, ABD pad fixed to the skin with Hypafix tape.  Patient was then returned to supine position, extubated, reactivated and  returned to the recovery room in satisfactory condition.  All sponge and  instrument counts were correct.  Patient's disc material was sent for  pathologic diagnosis.      Kerrin Champagne, M.D.  Electronically Signed     JEN/MEDQ  D:  02/09/2005  T:  02/09/2005  Job:  [id number]

## 2010-05-22 NOTE — Discharge Summary (Signed)
NAMEDOIS, JUARBE NO.:  [id number]   MEDICAL RECORD NO.:  [id number]          PATIENT TYPE:  INP   LOCATION:  5739                         FACILITY:  [facility]   PHYSICIAN:  Lonzo Cloud. Kriste Basque, M.D. [facility] OF BIRTH:  1945/02/18   DATE OF ADMISSION:  02/15/2005  DATE OF DISCHARGE:  02/20/2005                                 DISCHARGE SUMMARY   FINAL DIAGNOSES:  1.  Admitted February 15, 2005, with thrombophlebitis in the left leg.  This      occurring in the postoperative setting having had T1 surgery by Dr.      Otelia Sergeant one week ago.  Patient treated with IV heparin and p.o. Coumadin      and discharged on p.o. Coumadin with plans for six months of therapy.  2.  Recent neck and left arm pain with lower cervical stenosis and left      posterior lateral disk herniation at T1-T2.  He underwent a left T1-T2      laminoforaminoplasty with excision of herniated disk on February 09, 2005, by Dr. Otelia Sergeant.  Good postoperative relief of pain.  3.  History of low back pain with football injury many years ago and      followed by Dr.  Darrelyn Hillock.  4.  Gastroesophageal reflux disease on Prevacid.  5.  History of by diverticulosis, colon polyps and hemorrhoids.  6.  History of kidney stones with previous lithotripsy from Dr. Aldean Ast.  7.  History of benign prostatic hypertrophy and urinary tract infections in      the past.  8.  Known gallstone.  9.  Status post bilateral inguinal hernia repair in 1998 by Dr. Samuella Cota.  10. History of obesity and borderline diabetes on diet therapy.  11. History of borderline hypercholesterolemia with a low HDL.  Patient is      intolerant to Niaspan and has recently been started on Zocor.  12. History of mild anxiety.  13. History of rash believed secondary to quinine.  14. Negative cardiac evaluation in 2005 by Dr. Antoine Poche.   BRIEF HISTORY AND PHYSICAL:  The patient is a 65 year old gentleman known to  me who underwent T1-T2 surgery by Dr.  Otelia Sergeant on February 09, 2005.  He  presented with a two day history of left leg swelling with pain and  tenderness on the medial calf up the leg to the groin.  He was seen by  [facility] and evaluated by Dr. Amanda Pea in their work-in  schedule.  He was sent to [facility] for venous Dopplers.  Venous Dopplers  reported positive, and he was sent to the emergency room for admission by  internal medicine.  The Dopplers from [facility] showed extensive superficial  thrombophlebitis extending from the left greater saphenous vein and  propagating into the common femoral vein and the iliac in the groin.   PAST MEDICAL HISTORY:  As noted, he has a history of upper neck and back  pain radiating to his left arm. He was diagnosed with cervical stenosis and  herniated disk at T1-T2 and underwent  a laminoforaminotomy with excision of  the disk on a February 09, 2005, by Dr. Otelia Sergeant.  He had no previous history of  any DVT, etc.  No blood clotting abnormalities identified.  He has a history  of low back pain with a football injury and previously followed by Dr. Otelia Sergeant  and Dr. Darrelyn Hillock.  He has a history of gastroesophageal reflux disease for  which she takes Prevacid.  He has a history of diverticulosis, colon polyps  and hemorrhoids.  His last  colonoscopy in 2001 by Dr. Marina Goodell in followup is  due at the present time.  He has a history of kidney stones, a previous  lithotripsy by Dr. Aldean Ast.  He has a history of BPH and urinary tract  infections in the past.  He has a history of gallstone on previous  ultrasound. He has previous surgery for bilateral inguinal hernias in 1998  by Dr. Samuella Cota.  These were repaired with mesh.  He has underlying obesity and  borderline diabetes which he treats with diet therapy alone.  His blood  sugar was 138 and hemoglobin A1c 5.7 last year.  He has a history of mild  hypercholesterolemia with low HDL and is intolerant to Niaspan. His last  cholesterol was 190, HDL 28, LDL 138 and  triglycerides 119.  e was started  on Zocor at bedtime.  He has a history of mild anxiety.  He has a history of  rash believed secondary to quinine.  He had a cardiac evaluation by Dr.  Antoine Poche in 2005, with a negative Cardiolite which showed a normal function  and ejection fraction of 57%.   PHYSICAL EXAMINATION:  GENERAL:  Physical examination revealed a healthy-  appearing 66 year old gentleman mild discomfort with pain in his left leg  and in his neck from recent surgery.  He had no chest pain or dyspnea.  VITAL SIGNS:  Blood pressure 126/84, pulse 96 per minute and regular.  Respirations 18 per minute and not labored, temperature 98 degrees, O2 sat  95% on room air.  [name] HEENT exam unremarkable.  He had a mild a left  subconjunctival hemorrhage.  NECK:  Exam showed no jugular venous distention, no carotid bruits, no  thyromegaly  or lymphadenopathy.  CHEST:  Exam was clear to percussion and auscultation.  CARDIAC:  Exam revealed a regular rhythm; grade I/VI systolic ejection  murmur at the left sternal border; no rubs or gallops heard.  ABDOMEN:  The abdomen was obese but soft and nontender without evidence  organomegaly or masses.  EXTREMITIES:  Venous insufficiency changes bilaterally.  Mild swelling in  the left leg/  Some redness and tenderness along the medial calf up the  medial part of the leg to the groin area.  NEUROLOGIC:  Exam was intact without focal abnormalities detected.   LABORATORY DATA:  EKG showed normal sinus rhythm with mild interventricular  conduction delay.  No acute changes.  Chest x-ray showed some atelectasis at  the bases and borderline heart size. Hemoglobin 16.2, hematocrit 47.1, white  count 12,100 with 74% segs.  Sedimentation rate 30.  Pro time 13.2, INR 1.0,  PTT 28 seconds.  Protein C and protein S determinations were normal.  Sodium  138, potassium 4.0, chloride 108, CO2 27, BUN 12, creatinine 0.9, blood sugar 111, calcium 8.3, total protein 6.2,  albumin 3.1, AST 22, ALT 32,  alkaline phosphatase 57, total bilirubin 1.1, TSH 0.12.   HOSPITAL COURSE:  The patient was admitted with thrombophlebitis in the  left  leg.  It started in the greater saphenous vein on the left and promulgated  up into the common femoral vein and iliacs. He was kept at bedrest and  started on IV heparin.  He was started on Coumadin at the same time.  His  pro times and heparin levels were monitored carefully by pharmacy protocol.  His blood counts and platelet counts were monitored, and there were no  complications.  The patient's pro time gradually prolonged and was 21  seconds with an INR 1.8 on February 20, 2005.  He was felt to be safe for  discharge.  His heparin was discontinued, and he was placed on 7.5 mg  Coumadin daily with followup in two days in the office.   DISCHARGE MEDICATIONS:  1.  Coumadin 5 mg tablets take 1-1/2 tablets each day in the afternoon with      pro time in two days.  2.  Resume previous medications including Prevacid 30 mg p.o. every day.  3.  Enteric coated aspirin one tablet daily.  4.  Zocor 40 mg p.o. q.h.s.  5.  Multivitamin daily.   DISCHARGE INSTRUCTIONS:  He knows to rest at home, use support hose and keep  his legs elevated.  We plan office followup in one week and pro time in two  days.   CONDITION AT DISCHARGE:  Improved.      Lonzo Cloud. Kriste Basque, M.D. [facility]  Electronically Signed     SMN/MEDQ  D:  02/20/2005  T:  02/20/2005  Job:  [id number]   cc:   Kerrin Champagne, M.D.  Fax: [phone]

## 2010-07-23 NOTE — H&P (Unsigned)
[name], [name] NO.:  [id number]  MEDICAL RECORD NO.:  [id number]  LOCATION:  1S                           FACILITY:  [facility]  PHYSICIAN:  Madlyn Frankel. Charlann Boxer, M.D.  DATE OF BIRTH:  11-Oct-1945  DATE OF ADMISSION:  07/10/2010 DATE OF DISCHARGE:                             HISTORY & PHYSICAL   ADMISSION DIAGNOSIS:  Right knee osteoarthritis.  HISTORY OF PRESENT ILLNESS:  This is a 65 year old gentleman with a history of bilateral knee osteoarthritis with previous total knee arthroplasty on the left with good result.  He does have a history of DVT with other surgeries in the past, but not with the left knee surgery.  However, he will be on Lovenox postoperatively to bridge until his Coumadin is therapeutic and be treated with Coumadin postop.  Due to failure of conservative treatment for his right knee, he is now scheduled for total knee arthroplasty of the right knee.  Surgeries, benefits, and aftercare were discussed with the patient.  Questions invited and answered, Surgery to go ahead as scheduled.  PAST MEDICAL HISTORY:  Drug allergies to CIPRO and NIASPAN.  CURRENT MEDICATIONS: 1. Diclofenac 75 mg b.i.d. 2. Simvastatin 40 mg daily. 3. Metformin 500 mg b.i.d. 4. Diovan 160 mg 1/2 tablet daily. 5. Glimepiride 1 mg daily. 6. Lansoprazole 30 mg daily. 7. Zyrtec 10 mg daily. 8. Ecotrin 81 mg daily. 9. Ranitidine 150 mg daily. 10.Multivitamins.  Medical illnesses include hypertension, hyperlipidemia, diabetes, allergies, reflux, and history of DVT.  Previous surgeries include appendectomy, herniorrhaphy, ORIF of a fractured leg, total knee arthroplasty, and multiple kidney stone procedures.  FAMILY HISTORY:  Positive for heart attack and coronary artery disease.  SOCIAL HISTORY:  The patient is married.  He is retired.  He does not smoke and does not drink.  He plans on going home after surgery.  REVIEW OF SYSTEMS:  CENTRAL NERVOUS SYSTEM:  Negative  for headache, blurred vision, or dizziness.  PULMONARY:  Negative for shortness of breath, PND, or orthopnea.  CARDIOVASCULAR:  Positive for hypertension and history of phlebitis with varicose veins and history of DVT.  GI: Negative for ulcers, hepatitis.  Positive for reflux.  GU:  Positive for multiple kidney stones.  MUSCULOSKELETAL:  Positive as in HPI.  PHYSICAL EXAMINATION:  VITAL SIGNS:  BP 140/98, respirations 18, pulse 88 and regular. GENERAL APPEARANCE:  This is a well-developed well-nourished gentleman in no acute distress. HEENT:  Head normocephalic.  Nose patent.  Ears patent.  Pupils equal, round and reactive to light.  Throat without injection. NECK:  Supple without adenopathy.  Carotids 2+ without bruit. CHEST:  Clear to auscultation.  No rales or rhonchi.  Respirations 18. HEART:  Regular rate and rhythm at 88 beats per minute without murmur. ABDOMEN:  Soft.  Active bowel sounds.  No masses, organomegaly. NEUROLOGIC:  The patient alert and oriented to time, place, and person. Cranial nerves II-XII grossly intact. EXTREMITIES:  Shows the left knee status post total knee arthroplasty with 0-125 degree range of motion.  Right knee shows 5-degree flexion contracture with further flexion to 110. NEUROVASCULAR STATUS:  Intact. Both calves showed no tenderness, no cords.  Negative  Homan sign.  IMPRESSION:  Right knee osteoarthritis.  PLAN:  Total knee arthroplasty, right knee.  Note that the patient's medical doctor is Dr. Kriste Myers.  He plans on going home after surgery and he is not a candidate for tranexamic acid.     Jaquelyn Bitter. , P.A.   ______________________________ Madlyn Frankel Charlann Boxer, M.D.    SJC/MEDQ  D:  07/22/2010  T:  07/23/2010  Job:  [id number]

## 2010-07-24 ENCOUNTER — Telehealth: Payer: Self-pay | Admitting: Pulmonary Disease

## 2010-07-24 NOTE — Telephone Encounter (Signed)
STATEMENT GIVEN TO DR NADEL FOR PROPER DIAGNOSIS.  WHEN HE RETURNS TO ME, I WILL FORWARD TO [facility].

## 2010-07-24 NOTE — Telephone Encounter (Signed)
PATIENT'S BILL AT [facility] FOR $2927.25 LAB TESTS ORDERED DR NADEL WAS NOT NECESSARY.PATIENT CAME FOR VISIT FOR SURGICAL CLEARANCE.  DIAGNOSIS ON BILL WAS V70.3.  PLEASE MAKE SURE THAT CORRECT DIAGNOSIS IS SENT TO [facility] SO THAT INSURANCE WILL COVER.

## 2010-07-27 ENCOUNTER — Encounter (HOSPITAL_COMMUNITY): Payer: Medicare Other

## 2010-07-27 ENCOUNTER — Other Ambulatory Visit: Payer: Self-pay | Admitting: Orthopedic Surgery

## 2010-07-27 LAB — URINALYSIS, ROUTINE W REFLEX MICROSCOPIC
Nitrite: NEGATIVE
Specific Gravity, Urine: 1.026 (ref 1.005–1.030)
Urobilinogen, UA: 0.2 mg/dL (ref 0.0–1.0)

## 2010-07-27 LAB — CBC
Platelets: 315 10*3/uL (ref 150–400)
RDW: 14.4 % (ref 11.5–15.5)
WBC: 9 10*3/uL (ref 4.0–10.5)

## 2010-07-27 LAB — DIFFERENTIAL
Basophils Absolute: 0 10*3/uL (ref 0.0–0.1)
Basophils Relative: 0 % (ref 0–1)
Eosinophils Absolute: 0.2 10*3/uL (ref 0.0–0.7)
Eosinophils Relative: 2 % (ref 0–5)
Lymphocytes Relative: 25 % (ref 12–46)

## 2010-07-27 LAB — URINE MICROSCOPIC-ADD ON

## 2010-07-27 LAB — BASIC METABOLIC PANEL
CO2: 28 mEq/L (ref 19–32)
Calcium: 9.8 mg/dL (ref 8.4–10.5)
Creatinine, Ser: 1.26 mg/dL (ref 0.50–1.35)
Glucose, Bld: 132 mg/dL — ABNORMAL HIGH (ref 70–99)

## 2010-07-27 LAB — APTT: aPTT: 30 seconds (ref 24–37)

## 2010-08-03 ENCOUNTER — Inpatient Hospital Stay (HOSPITAL_COMMUNITY)
Admission: RE | Admit: 2010-08-03 | Discharge: 2010-08-05 | DRG: 470 | Disposition: A | Discharge status: Home Health | Payer: Medicare Other | Source: Ambulatory Visit | Attending: Orthopedic Surgery | Admitting: Orthopedic Surgery

## 2010-08-03 DIAGNOSIS — M1710 Unilateral primary osteoarthritis, unspecified knee: Principal | ICD-10-CM | POA: Diagnosis present

## 2010-08-03 DIAGNOSIS — Z86718 Personal history of other venous thrombosis and embolism: Secondary | ICD-10-CM

## 2010-08-03 DIAGNOSIS — K219 Gastro-esophageal reflux disease without esophagitis: Secondary | ICD-10-CM | POA: Diagnosis present

## 2010-08-03 DIAGNOSIS — Z01812 Encounter for preprocedural laboratory examination: Secondary | ICD-10-CM

## 2010-08-03 DIAGNOSIS — I10 Essential (primary) hypertension: Secondary | ICD-10-CM | POA: Diagnosis present

## 2010-08-03 DIAGNOSIS — E785 Hyperlipidemia, unspecified: Secondary | ICD-10-CM | POA: Diagnosis present

## 2010-08-03 DIAGNOSIS — E119 Type 2 diabetes mellitus without complications: Secondary | ICD-10-CM | POA: Diagnosis present

## 2010-08-03 LAB — GLUCOSE, CAPILLARY
Glucose-Capillary: 127 mg/dL — ABNORMAL HIGH (ref 70–99)
Glucose-Capillary: 127 mg/dL — ABNORMAL HIGH (ref 70–99)

## 2010-08-03 LAB — TYPE AND SCREEN

## 2010-08-03 NOTE — Op Note (Signed)
NAMEWILLSON, LIPA NO.:  [id number]  MEDICAL RECORD NO.:  [id number]  LOCATION:  0006                         FACILITY:  [facility]  PHYSICIAN:  Madlyn Frankel. Charlann Boxer, M.D.  DATE OF BIRTH:  11-02-1945  DATE OF PROCEDURE:  08/03/2010 DATE OF DISCHARGE:                              OPERATIVE REPORT   PREOPERATIVE DIAGNOSIS:  Right knee osteoarthritis.  POSTOPERATIVE DIAGNOSIS:  Right knee osteoarthritis.  PROCEDURE:  Right total knee replacement.  COMPONENTS USED:  DePuy rotating platform posterior stabilized knee system with size 5 femur, 4 tibia, 10-mm insert and a 41 patellar button.  SURGEON:  Madlyn Frankel. Charlann Boxer, M.D.  ASSISTANT:  Lanney Gins, PA  ANESTHESIA:  Spinal.  SPECIMENS:  None.  COMPLICATIONS:  None.  BLOOD LOSS:  Minimal.  DRAINS:  One Hemovac.  TOURNIQUET TIME:  44 minutes at 250 mmHg.  INDICATIONS FOR PROCEDURE:  Mr. Gulley is a 65 year old gentleman who has been a patient from previous left knee arthroplasty.  He had done exceptionally well with this and at this point ready to proceed for definitive management of his right knee.  He had failed respond to conservative measures.  After reviewing the risks and benefits, the postoperative course and expectations planned for total knee replacement set mostly for pain relief.  Consent was obtained.  PROCEDURE IN DETAIL:  The patient was brought to operative theater. Once adequate anesthesia, preoperative antibiotics, Ancef administered, the patient was positioned supine with the right thigh tourniquet placed.  The right lower extremity was then prepped and draped in sterile fashion with right foot placed in the Mayo leg holder.  Time-out was performed, identifying the patient, planned procedure and extremity. Right lower extremity was then exsanguinated, tourniquet elevated to 250 mmHg.  Midline incision was made followed by median parapatellar arthrotomy. Once initial exposure was  obtained including proximal medial peel, attention was first directed to patella.  Precut measurement was 26 mm. I resected down to 14 mm and used a 41 patellar button to restore height.  Lug holes were drilled and a metal shim placed to protect the patella from retractors and saw blades.  Attention was now directed to femur.  Femoral canal was opened and drilled, irrigated to try to prevent fatty emboli.  An intramedullary rod was passed at 5 degrees of valgus, 10 mm of bone resected off the distal femur.  Following this resection, the tibia was subluxated anteriorly using extramedullary guide and measured resection off the proximal lateral tibia was made with 10 mm.  Following this resection, we confirmed the gap would be stable medially and laterally with 10-mm insert and also that the cut was perpendicular in the coronal plane.  Following this, I sized the femur to be a size 5 which matched to the contralateral knee.  The size 5 rotation block was then pinned into position with anterior reference using a C-clamp to set rotation.  The 4 in 1 cutting block was pinned into position.  The anterior, posterior and chamfer cuts were then made without difficulty nor notching.  Final box cut was made off the lateral aspect of distal femur.  At this point, the tibia subluxated anteriorly.  The cut surface after removing medial osteophytes seemed to be best fit with a size 4 tibial tray matching the contralateral knee.  The 4 tray was pinned into position, drilled and keel punched.  A trial reduction now carried out with 5 femur, 4 tibia and 10-mm insert.  The knee came to full extension and was stable from extension to flexion.  The patella tracked through the trochlea without application of pressure.  Given these findings, the trial components removed from the synovial capsule junction.  Knee was injected with 0.25% Marcaine with epinephrine and 1 mL of Toradol, total of 51 mL.  The knee  was irrigated with normal saline solution and pulse lavage.  Final components were opened and cement mixed.  The final components were cemented onto clean and dried cut surfaces of bone.  The knee was brought to extension with 10-mm insert and extruded cement was removed.  Once the cement had fully cured, excessive cement was removed throughout the knee.  A final 10-mm insert have been chosen and was placed into the knee.  Tourniquet had been let down after 44 minutes without significant hemostasis required.  The knee was then irrigated with normal saline solution.  Again, a medium Hemovac drain was placed deep.  An extensor mechanism was then reapproximated using #1 Vicryl with the knee in flexion.  The remaining wound was closed with 2- 0 Vicryl and running 4-0 Monocryl.  The knee was cleaned, dried, dressed sterilely using Dermabond and Aquacel dressing.  Drain site dressed separately.  The knee was then wrapped in Ace wrap.  The patient was brought to recovery room in stable condition, tolerating the procedure well.     Madlyn Frankel Charlann Boxer, M.D.     MDO/MEDQ  D:  08/03/2010  T:  08/03/2010  Job:  [id number]  Electronically Signed by Durene Romans M.D. on 08/03/2010 12:23:14 PM

## 2010-08-04 LAB — BASIC METABOLIC PANEL
Calcium: 8.3 mg/dL — ABNORMAL LOW (ref 8.4–10.5)
Creatinine, Ser: 0.93 mg/dL (ref 0.50–1.35)
GFR calc Af Amer: >60 mL/min (ref >60)
GFR calc non Af Amer: >60 mL/min (ref >60)
Sodium: 134 mEq/L — ABNORMAL LOW (ref 135–145)

## 2010-08-04 LAB — GLUCOSE, CAPILLARY
Glucose-Capillary: 107 mg/dL — ABNORMAL HIGH (ref 70–99)
Glucose-Capillary: 129 mg/dL — ABNORMAL HIGH (ref 70–99)

## 2010-08-04 LAB — CBC
MCH: 27.2 pg (ref 26.0–34.0)
MCHC: 32.7 g/dL (ref 30.0–36.0)
MCV: 83.3 fL (ref 78.0–100.0)
Platelets: 232 10*3/uL (ref 150–400)
RDW: 14.6 % (ref 11.5–15.5)

## 2010-08-05 LAB — CBC
HCT: 35.2 % — ABNORMAL LOW (ref 39.0–52.0)
Hemoglobin: 11.1 g/dL — ABNORMAL LOW (ref 13.0–17.0)
MCH: 26.6 pg (ref 26.0–34.0)
MCV: 84.2 fL (ref 78.0–100.0)
Platelets: 236 10*3/uL (ref 150–400)
RBC: 4.18 MIL/uL — ABNORMAL LOW (ref 4.22–5.81)
WBC: 12.6 10*3/uL — ABNORMAL HIGH (ref 4.0–10.5)

## 2010-08-05 LAB — BASIC METABOLIC PANEL
Chloride: 101 mEq/L (ref 96–112)
GFR calc Af Amer: >60 mL/min (ref >60)
GFR calc non Af Amer: >60 mL/min (ref >60)
Glucose, Bld: 154 mg/dL — ABNORMAL HIGH (ref 70–99)
Potassium: 4.9 mEq/L (ref 3.5–5.1)
Sodium: 136 mEq/L (ref 135–145)

## 2010-08-06 LAB — GLUCOSE, CAPILLARY
Glucose-Capillary: 164 mg/dL — ABNORMAL HIGH (ref 70–99)
Glucose-Capillary: 155 mg/dL — ABNORMAL HIGH (ref 70–99)

## 2010-08-08 NOTE — Discharge Summary (Signed)
[name], [name] NO.:  [id number]  MEDICAL RECORD NO.:  [id number]  LOCATION:  1612                         FACILITY:  [facility]  PHYSICIAN:  Madlyn Frankel. Charlann Boxer, M.D.  DATE OF BIRTH:  1945/07/22  DATE OF ADMISSION:  08/03/2010 DATE OF DISCHARGE:  08/05/2010                              DISCHARGE SUMMARY   PROCEDURE:  Right TKA.  ADMITTING DIAGNOSIS:  Right knee osteoarthritis.  DISCHARGE DIAGNOSES: 1. Status post right total knee arthroplasty. 2. Hypertension. 3. Hyperlipidemia. 4. Diabetes. 5. Allergies. 6. Reflux. 7. History of deep venous thrombosis.  HISTORY OF PRESENT ILLNESS:  The patient is a 65 year old gentleman with a history of bilateral knee osteoarthritis with a previous total knee arthroplasty on the left with good results.  X-rays in the clinic revealed significant osteoarthritis of the right knee.  The patient has failed conservative treatment and wished to proceed with surgery. Risks, benefits, and expectations of the procedure were discussed with patient.  The patient understands the risks, benefits, and expectations and wishes to proceed with right TKA.  The patient has had a history of DVT with previous surgery, now with a left knee arthroplasty, however, but we will treat accordingly.  Questions were invited and answered and the patient wishes to proceed with surgery.  HOSPITAL COURSE:  The patient underwent the above-stated procedure on August 03, 2010.  The patient tolerated the procedure well and was brought to the recovery room in good condition and subsequently to the floor. Postop day #1, August 04, 2010, the patient was doing well with no incidents.  The patient is still complaining of some minimal pain.  The patient was originally put on Xarelto, but was switched to Lovenox and Coumadin while in the hospital.  Otherwise, the patient was afebrile. Vital signs were stable.  Hemoglobin 11.6, hematocrit 35.5.  Hemovac drain was  discontinued as well as the Foley.  PT/OT was started on postop day #2, August 05, 2010.  The patient was doing really well, no incidents.  The patient has minimal pain and wishes to go home. Hemoglobin 11.1, hematocrit 35.2.  The patient is distally neurovascularly intact.  Again, the patient is feeling very well and wanting to go home.  The patient will be discharged today after physical therapy.  DISCHARGE CONDITION:  Good.  DISCHARGE INSTRUCTIONS:  The patient will be discharged home today on August 05, 2010.  The patient will be weightbearing as tolerated.  The patient did maintain a surgical dressing for about 8 weeks after that, at which time, he can replace with gauze and tape.  The patient is to keep the area dry and clean until followup.  The patient will follow up at [facility] with Dr. Charlann Boxer in 2 weeks.  DISCHARGE MEDICATIONS: 1. Coumadin 5 mg 1 p.o. daily as directed by home health with a goal     of INR between 2 and 3. 2. Lovenox 40 mg subcutaneous daily (until INR greater than 2, again     directed by home health). 3. Colace 100 mg 1 p.o. b.i.d. p.r.n. constipation. 4. Iron sulfate 325 mg 1 p.o. t.i.d. for 2-3 weeks. 5. Norco 7.5/325, 1-2 p.o. q.4-6  h. p.r.n. pain. 6. Robaxin 500 mg 1 p.o. q.6 h. p.r.n. muscle spasm. 7. MiraLax 17 g 1 p.o. b.i.d. p.r.n. constipation. 8. Diovan 160 mg half tablet q.a.m. 9. Amaryl 1 mg 1 p.o. q.a.m. 10.Lansoprazole 30 mg 1 p.o. q.a.m. 11.Metformin 500 mg 1 p.o. b.i.d. 12.Multivitamin 1 p.o. daily. 13.Ranitidine 150 mg 1 p.o. nightly p.r.n. 14.Simvastatin 40 mg 1 p.o. nightly. 15.Zyrtec 1 p.o. daily.    ______________________________ Lanney Gins, PA   ______________________________ Madlyn Frankel. Charlann Boxer, M.D.    MB/MEDQ  D:  08/05/2010  T:  08/06/2010  Job:  [id number]  Electronically Signed by Lanney Gins PA on 08/06/2010 10:06:06 AM Electronically Signed by Durene Romans M.D. on 08/08/2010 09:19:59 AM

## 2010-09-13 IMAGING — CT CT CHEST W/O CM
2 of 6 series · 14 of 36 positions shown, 17 images · IV contrast (Omnipaque 300)
Comparison: Chest x-ray 02/26/2008 and 02/15/2005

CLINICAL DATA: Nodular density on chest x-ray 02/26/2008.
Shortness of breath.

CT CHEST WITHOUT CONTRAST
TECHNIQUE: Multidetector CT imaging of the chest was performed
following the standard protocol without IV contrast.

[Series 2: chest routine without · axial · non-contrast · 0.95mm/px · z∈[-328,-88]mm · 11 of 58 slices shown, 14 images]
[im 5/58  mediastinal]
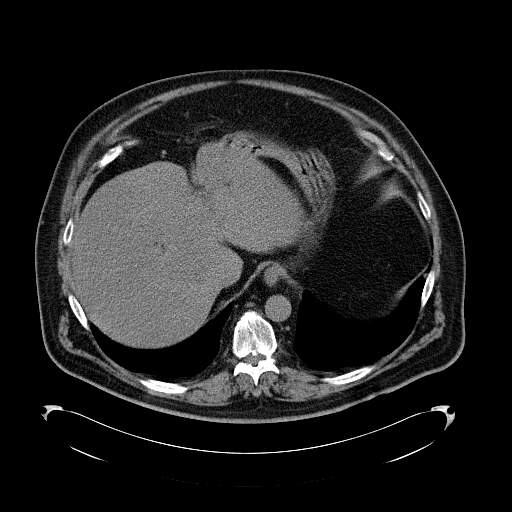
[im 5/58  lung]
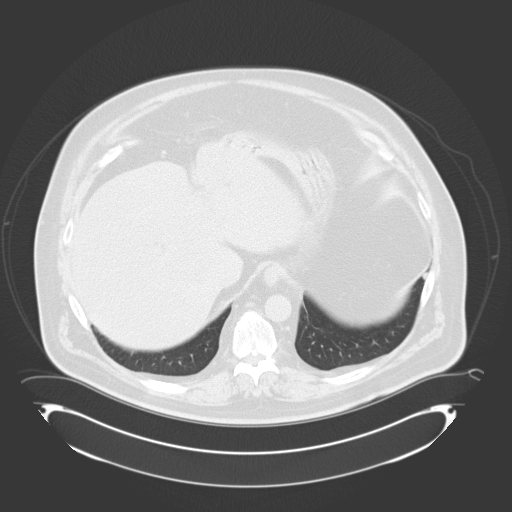
[im 10/58  lung]
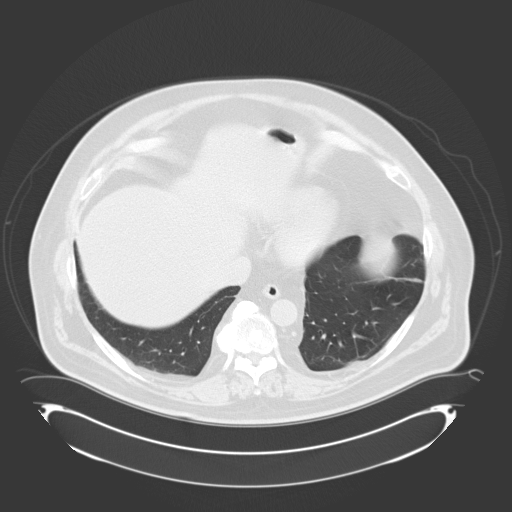
[im 15/58  lung]
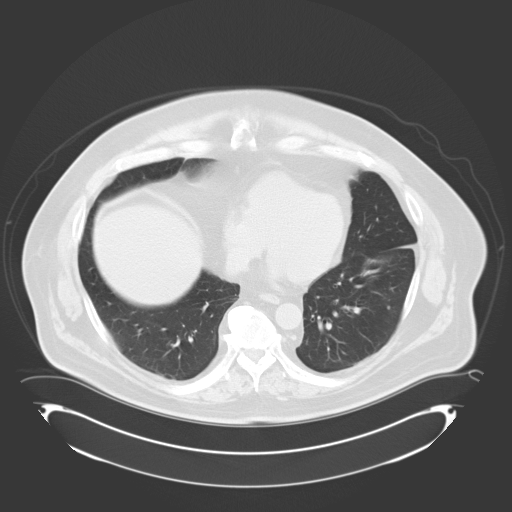
[im 20/58  lung]
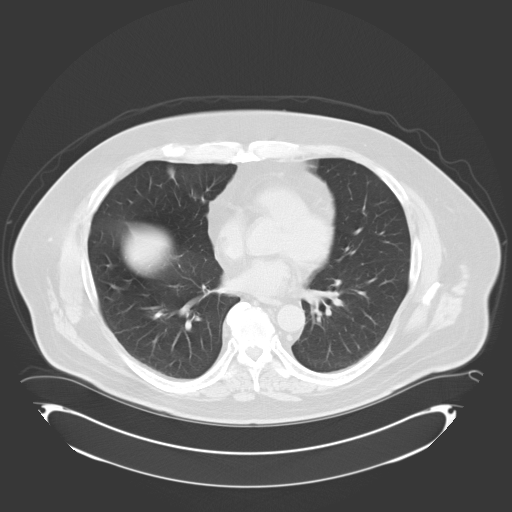
[im 24/58  mediastinal]
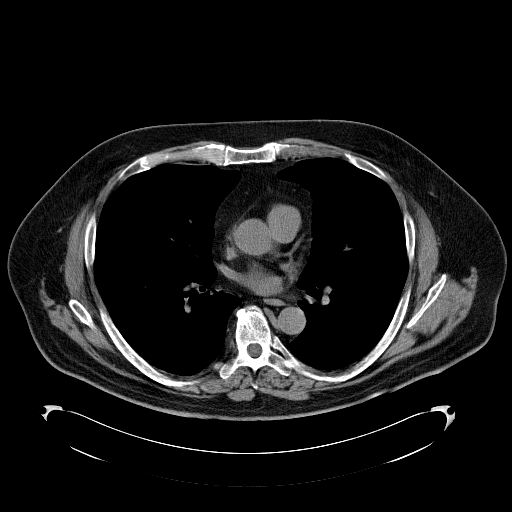
[im 24/58  lung]
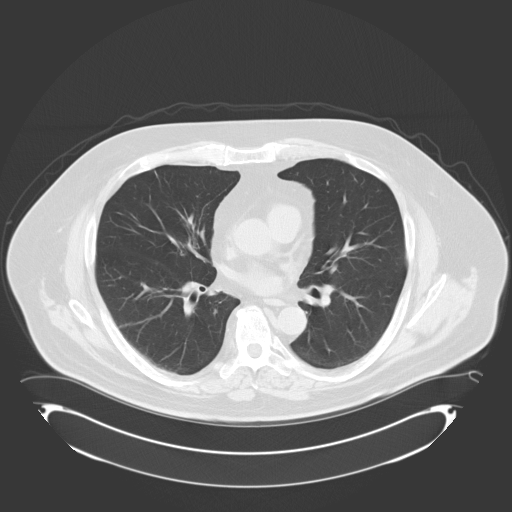
[im 29/58  lung]
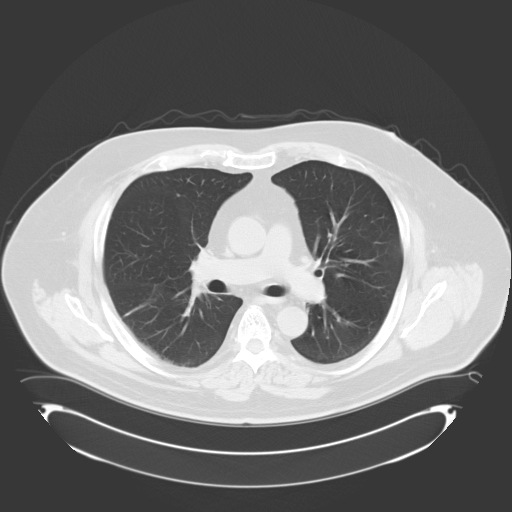
[im 34/58  lung]
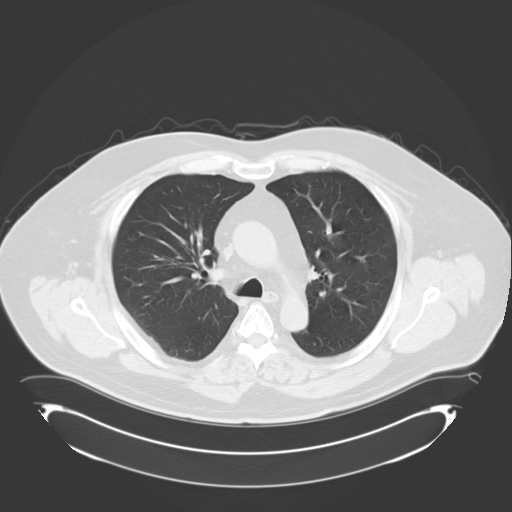
[im 39/58  lung]
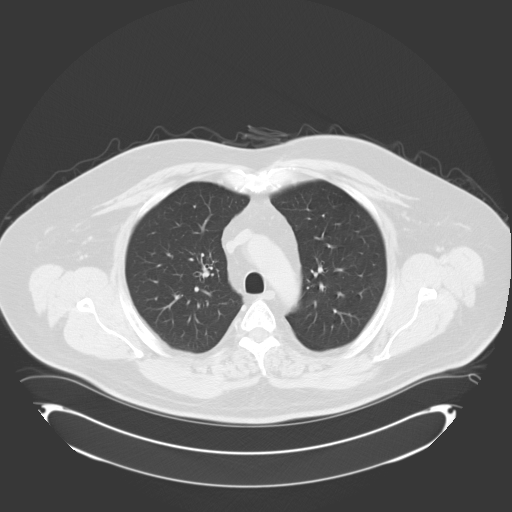
[im 43/58  mediastinal]
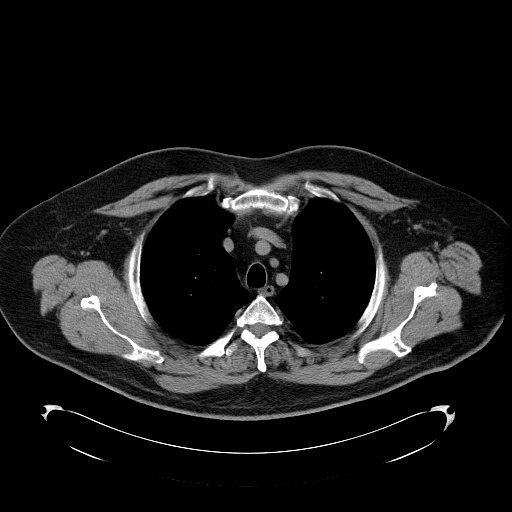
[im 43/58  lung]
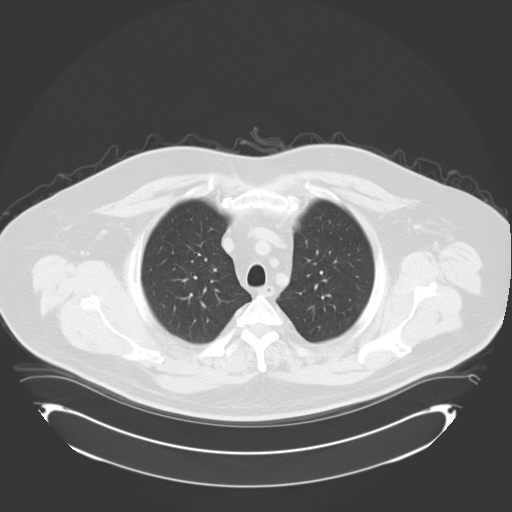
[im 48/58  lung]
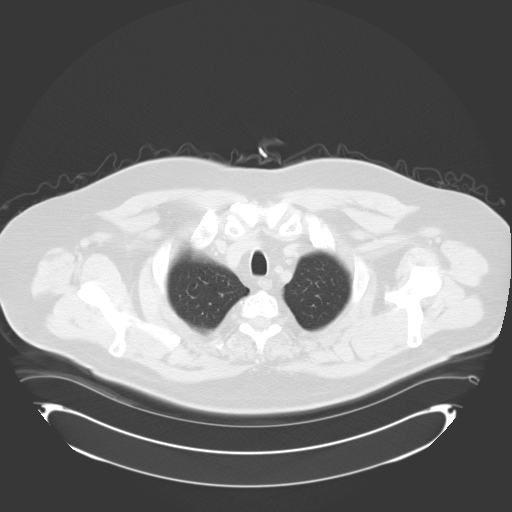
[im 53/58  lung]
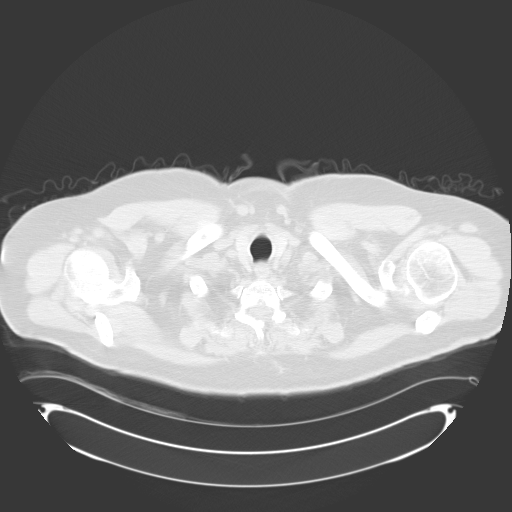

[Series 604: add'l coronal · coronal · 0.95mm/px · 3 of 112 slices shown]
[im 23/112  lung]
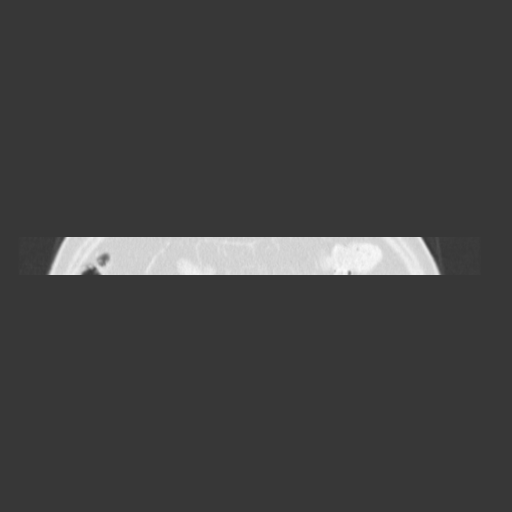
[im 67/112  lung]
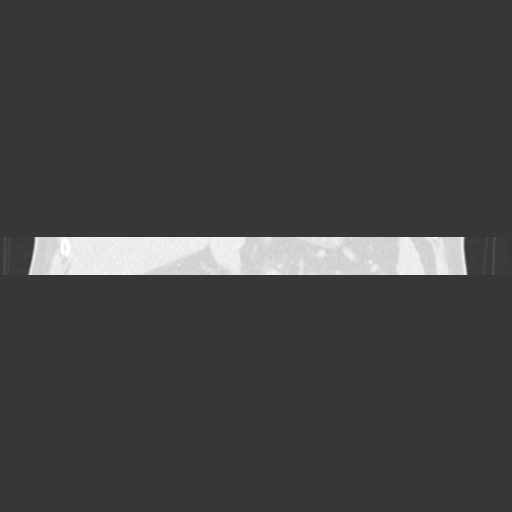
[im 89/112  lung]
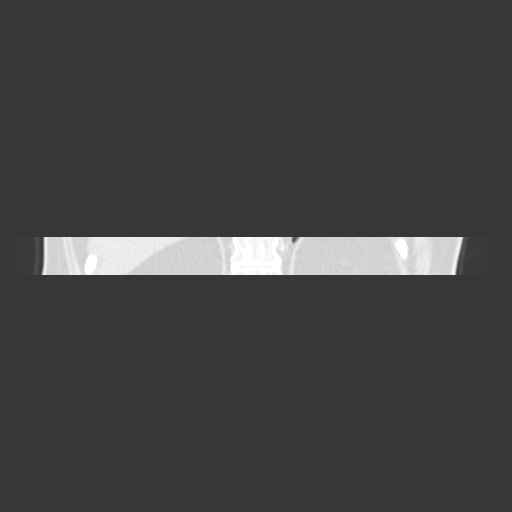

[14 of 36 positions shown; findings below may reference images not displayed]

FINDINGS: No pathologically enlarged mediastinal, hilar or axillary
lymph nodes.  Heart size normal.  No pericardial effusion.  There
are small retrocrural lymph nodes.

A 4 mm nodule is seen along the right hemidiaphragm.  A smaller
subpleural nodule is seen in the right lower lobe.   Scarring is
seen in the right middle lobe and both lower lobes.  There is
mildly prominent extrapleural fat at the bases bilaterally.  No
dominant pulmonary nodule.  No pleural fluid.  Airway is
unremarkable.

Incidental imaging of the upper abdomen shows a 3.0 x 2.3 cm low
attenuation lesion in the right adrenal gland, which measures
HU.  Prominent osteophytes are seen in the thoracic spine.
Specifically, anterior and right lateral osteophytes along the T11
vertebral body account for finding on recent chest x-ray.  No
worrisome lytic or sclerotic lesions.
IMPRESSION: 1.  Prominent T11 osteophytosis accounts for the finding on recent
chest x-ray.
2.  Tiny right lower lobe nodules are likely subpleural lymph
nodes. If the patient is at high risk for bronchogenic carcinoma,
follow-up chest CT at 1 year is recommended.  If the patient is at
low risk, no follow-up is needed.  This recommendation follows the
consensus statement: "Guidelines for Management of Small Pulmonary
Nodules Detected on CT Scans:  A Statement from the [employer]
online at:  [URL]
3.  Right adrenal adenoma.

## 2010-09-17 ENCOUNTER — Ambulatory Visit (INDEPENDENT_AMBULATORY_CARE_PROVIDER_SITE_OTHER): Payer: Medicare Other

## 2010-09-17 DIAGNOSIS — Z23 Encounter for immunization: Secondary | ICD-10-CM

## 2010-09-18 ENCOUNTER — Other Ambulatory Visit: Payer: Self-pay | Admitting: Pulmonary Disease

## 2010-09-21 DIAGNOSIS — Z23 Encounter for immunization: Secondary | ICD-10-CM

## 2010-10-02 ENCOUNTER — Other Ambulatory Visit: Payer: Self-pay | Admitting: Pulmonary Disease

## 2010-10-05 ENCOUNTER — Ambulatory Visit (INDEPENDENT_AMBULATORY_CARE_PROVIDER_SITE_OTHER): Payer: Medicare Other | Admitting: Pulmonary Disease

## 2010-10-05 ENCOUNTER — Other Ambulatory Visit (INDEPENDENT_AMBULATORY_CARE_PROVIDER_SITE_OTHER): Payer: Medicare Other

## 2010-10-05 DIAGNOSIS — I82890 Acute embolism and thrombosis of other specified veins: Secondary | ICD-10-CM

## 2010-10-05 DIAGNOSIS — K219 Gastro-esophageal reflux disease without esophagitis: Secondary | ICD-10-CM

## 2010-10-05 DIAGNOSIS — E669 Obesity, unspecified: Secondary | ICD-10-CM

## 2010-10-05 DIAGNOSIS — M1990 Unspecified osteoarthritis, unspecified site: Secondary | ICD-10-CM

## 2010-10-05 DIAGNOSIS — I10 Essential (primary) hypertension: Secondary | ICD-10-CM

## 2010-10-05 DIAGNOSIS — E119 Type 2 diabetes mellitus without complications: Secondary | ICD-10-CM

## 2010-10-05 DIAGNOSIS — E785 Hyperlipidemia, unspecified: Secondary | ICD-10-CM

## 2010-10-05 LAB — BASIC METABOLIC PANEL
BUN: 17 mg/dL (ref 6–23)
Chloride: 108 mEq/L (ref 96–112)
Creatinine, Ser: 1 mg/dL (ref 0.4–1.5)
Glucose, Bld: 118 mg/dL — ABNORMAL HIGH (ref 70–99)
Potassium: 4.6 mEq/L (ref 3.5–5.1)

## 2010-10-05 LAB — HEMOGLOBIN A1C: Hgb A1c MFr Bld: 6 % (ref 4.6–6.5)

## 2010-10-05 LAB — CBC WITH DIFFERENTIAL/PLATELET
Basophils Absolute: 0 10*3/uL (ref 0.0–0.1)
Eosinophils Absolute: 0.2 10*3/uL (ref 0.0–0.7)
HCT: 41.3 % (ref 39.0–52.0)
Lymphs Abs: 2.2 10*3/uL (ref 0.7–4.0)
MCV: 84.9 fl (ref 78.0–100.0)
Monocytes Absolute: 0.8 10*3/uL (ref 0.1–1.0)
Platelets: 290 10*3/uL (ref 150.0–400.0)
RDW: 17 % — ABNORMAL HIGH (ref 11.5–14.6)

## 2010-10-05 MED ORDER — DICLOFENAC SODIUM 75 MG PO TBEC: 75.0000 mg | DELAYED_RELEASE_TABLET | Freq: Two times a day (BID) | ORAL | Status: DC

## 2010-10-05 MED ORDER — VALSARTAN 160 MG PO TABS: ORAL_TABLET | ORAL | Status: DC

## 2010-10-05 MED ORDER — GLIMEPIRIDE 1 MG PO TABS: 1.0000 mg | ORAL_TABLET | Freq: Every day | ORAL | Status: DC

## 2010-10-05 MED ORDER — SIMVASTATIN 40 MG PO TABS: 40.0000 mg | ORAL_TABLET | Freq: Every day | ORAL | Status: DC

## 2010-10-05 NOTE — Progress Notes (Signed)
Subjective:    Patient ID: [name], male    DOB: Jul 12, 1945, 65 y.o.   MRN: [id number]  HPI 65 y/o WM here for a follow up visit... he has multiple medical problems including:  HBP;  VV/ VI/ hx DVT;  Hyperlipidemia;  DM;  Obesity;  GERD/ Divertics/ Colon polyps/ Hems;  Gallstone;  BPH/ Bladder outlet obstruction;  Kidney stones;  DJD/ LBP;  Anxiety...  ~  September 15, 2009:  he's had a busy 32mo- left knee arthroscopy by DrSupple 2 wks ago & kidney stone surg DrPeterson 7/11 w/ cysto, ureteroscopy, laser frag & stent;  then f/u Urology 8/11 for this & his BPH, incr PSA & ED> he says PSA was "OK"...  BP is controlled on Diovan;  FLP looks good on Simva40;  DM reasonable on his Metform+Glimep as he needs to lose weight to affect better control!...  daughter wants him to have Rheum consult;  he wants handicap sticker & 90d refills;  OK 2011 Flu vaccine.  ~  April 07, 2010:  38mo ROV & he is sched for left TKR by DrOlin soon;  Asked to check pt pre-op for hx DVT & assess risk w/ +FamHx> pt had DVT 2007 after back surg & was treated w/ Hep/ Coumadin- discontinued after 1 yr & no recurrence;  He subseq had broken ankle after a fall in 2009 w/ surg DrDuda & no prob in the post op period;  Presented w/ some leg swelling in 2010 & Ven Dopplers were neg for DVT, treated w/ sodium restriction, elevation, support hose;  He had left knee arthroscopy 8/11 by DrSupple followed by 5d Lovenox Rx & no prob w/ that approach;  As far as the [employer] is concerned- Mother is the only family member w/ blood clots (she is still alive at 19- hx DM improved w/ wt reduction, DJD, Gout, VV, and hx DVT in past on Coumadin for yrs);  NOTE> Ven Dopplers done 03/25/10 showed +VV, no DVT;  His risk is elev due to his prev hx but not extreme & we will check Hypercoag Panel for completeness> he should be OK w/ the standard DVT prophylaxis after knee surg...    HBP>  On Diovan 160mg tabs- 1/2 tab daily + low sodium/ wt reducing diet;  BP=  120/70 & similar at home, tol med well & denies CP/ palpit/ dizzy/ SOB/ incr edema etc...    Chol>  On Simva40 & FLP shows TChol 125, TG 141, HDL 29, LDL 68;  Good control on med but needs better low fat diet & incr exercise...    DM>  On Metform500Bid + Glimep1mg  Qam;  FBS=102 (pre-op labs) & A1c=7.2> pt rec to continue same meds & get on better low carb, wt reducing diet...    Obese>  His weight is 266#, 70" tall, BMI= 38;  We have discussed diet & exercise w/ each office visit but he's had difficulty w/ compliance due to his arthritis & hopefully the TKR will allow for more vigorous exercise program...    GI>  Hx GERD, Divertics, polyps> last colon 2010 by [name] was neg & f/u planned 41yrs;  He takes Prevacid 30mg /d, and Zantac 150mg  Qhs...    GU>  Long hx kidney stones followed by DrPeterson & Dahlstadt;  Pre-op labs showed UTI & he's been started on SeptraDS, NOTE- he is very allergic to Cipro;  Labs showed BUN=19, Creat=1.13  ~  October 05, 2010:  32mo ROV & [name] has been busy again  in the interval- s/p left TKR by DrOlin 4/12 & s/p right TKR 7/12; by all accounts he did beautifully, no complic, went thru rehab & now into exercise program (off Hydrocodone, using Voltaren prn); weight down 9# and BS/A1c improved (on Metformin, Amaryl); he reports passing several small kidney stones as well & is taking Rapaflo from Urology...    HBP>  On Diovan 160mg tabs- 1/2 tab daily + low sodium/ wt reducing diet;  BP= 140/86 & similar at home, tol med well & denies CP/ palpit/ dizzy/ SOB/ incr edema etc...    Chol>  On Simva40 & FLP 4/12 showed TChol 125, TG 141, HDL 29, LDL 68;  Good control on med but needs better low fat diet & incr exercise...    DM>  On Metform500Bid + Glimep1mg  Qam;  FBS=118 & A1c=6.0 now> w/ his wt reduction & incr exerc program- we will rec decr Glimep1mg  to 1/2 tab Qam...    Obese>  His weight is down 9# to 257#, 70" tall, BMI= 36-7;  We have discussed diet & exercise w/ each office  visit but he's had difficulty w/ compliance due to his arthritis & hopefully the TKRs will allow for more vigorous exercise program...    GI>  Hx GERD, Divertics, polyps> last colon 2010 by [name] was neg & f/u planned 66yrs;  He takes Prevacid 30mg /d, and Zantac 150mg  Qhs...    GU>  Long hx kidney stones followed by DrPeterson & Dahlstadt;  NOTE- he is very allergic to Cipro;  Labs show BUN=17, Creat=1.0;  [name] placed him on Rapaflo to aide stone passing.    DJD>  As above, s/p bilat TKRs, done w/ rehab & into his exercise program, using Voltaren as needed...          Problem List:  ? of PULMONARY NODULE, LEFT LOWER LOBE (ICD-518.89) - CXR 3/10 showed ?nodular density on lat film & subseq CT Chest showed that this was an osteophyte, & also displayed some basilar fibrosis, 4mm RLL nodule, & 3cm right adrenal adenoma... ~  CXR 4/12 showed boderline heart size, clear lungs, large osteophytes in lower spine appear stable...  HYPERTENSION, BENIGN (ICD-401.1) - on ASA 81mg /d,  DIOVAN 160mg - 1/2 tab/d... ~  NuclearStressTest for surg clearance 1/09 was neg- ? soft tissue atten ant, no ischemia, EF= 57%... ~  EKG in ER 5/10 showed NSR, no ectopy, IVCD, LAD, no acute changes. ~  EKG 4/12 showed NSR, LAD, otherw wnl...  VARICOSE VEINS, LOWER EXTREMITIES (ICD-454.9) VENOUS INSUFFICIENCY (ICD-459.81) Hx of DEEP VEIN THROMBOSIS (ICD-453.8) - he has VV & VI as baseline> hx left leg DVT after neck surgery 2007 treated w/ coumadin; no recurrent prob after left leg fracture w/ surg 11/09 by Tyna Jaksch... ~  4/12:  Pre-op assessment for TKR> neg Ven Dopplers & neg hypercoagulable panel; OK for surg... ~  10/12:  He had left TKR 4/12 & right TKR 7/12; no complic, went thru rehab & now exercising on his own...  HYPERLIPIDEMIA (ICD-272.4) - on SIMVASTATIN 40mg /d... ~  FLP 2/09 on Simva40 showed TChol 109, TG 100, HDL 32, LDL 57... continue med, better diet, get wt down! ~  FLP 2/10 on Simva40 showed TChol  103, TG 69, HDL 34, LDL 56 ~  FLP 3/11 on Simva40 showed TChol 114, TG 105, HDL 38, LDL 55 ~  FLP 9/11 on Simva40 showed TChol 128, TG 134, HDL 30, LDL 72... needs incr exerc & get wt down. ~  FLP 4/12 on Simva40 showed TChol  128, TG 141, HDL 29, LDL 68  DIABETES MELLITUS (ICD-250.00) - on METFORMIN 500mg Bid & GLIMEPIRIDE 1mg /d (added 9/10)... he has been unable to lose weight! ~  labs 7/08 showed BS=117, HgA1c=6.5.[name]. rec> same meds, better diet... ~  labs 2/09 showed BS=120... rec> same med, better diet and get weight down!! ~  labs 2/10 showed BS= 145, A1c= 7.1 ~  labs 9/10 showed BS= 199, A1c= 7.8.[name]. rec> added GLIMEPIRIDE 1mg /d. ~  labs 3/11 showed BS= 149, A1c= 6.8, weight = 268#, 70" tall, BMI= 38.5 ~  labs 9/11 showed BS= 135, A1c= 7.1 ~  Labs 4/12 showed BS= 102, A1c=7.2 ~  Labs 10/12 showed BS= 118, A1c= 6.0.Marland KitchenMarland Kitchen Post op TKRs, exercising, wt down 9#, REC to decr Glimep1mg - 1/2 tab Qam.  G E R D (ICD-530.81) - he takes PREVACID 30mg /d, and RANITADINE 150mg  Qhs...  ~  he had EGD by DrPatterson 1994 showing gastritis ?due to NSAIDs, Rx'd Zantac...  DIVERTICULOSIS OF COLON (ICD-562.10) HEMORRHOIDS (ICD-455.6) COLONIC POLYPS (ICD-211.3) ~  colonoscopy 5/01 by [name] showed sm polyps (hyperplastic), divertics, hems, & ileal inflamm- neg bx...  ~  colonoscopy 7/10 by [name]- divertics, hems, diminutive polyp in transverse colon w/ bx= polypoid mucosa, no adenoma, f/u planned 10y.   CHOLELITHIASIS (ICD-574.20) - gallstone noted on sonar 8/04... no symptoms- denies n/v, abd pain, etc...  BENIGN PROSTATIC HYPERTROPHY, WITH OBSTRUCTION (ICD-600.01) - neg biopsies 10/06 by DrDahlstedt... ~  9/10: pt states sl incr PSA's followed by DrPeterson Q61mo- we don't have their lab data.  NEPHROLITHIASIS (ICD-592.0) - followed by DrPeterson> "I've had them ever since I was 16"... ~  CT Abd 7/09 by Urology showed bilat non-obstructing stones, incidental 2-3cm right adrenal adenoma, DJD Lspine. ~   2/10: states he passed a sm stone on his own... ~  7/11:  s/p cysto, ureteroscopy, laser frag & stent for yet another stone. ~  10/12:  He reports passing several sm stones over the last few mo, on Rapaflo per [name]...  DEGENERATIVE JOINT DISEASE (ICD-715.90) - c/o bilat knee pain> he saw DrSupple for Ortho w/ shot in right knee 2010 (helped); shot didn't help on left- therefore left knee arthroscopy 8/11... ~  4/12:  S/p left TKR by DrOlin... ~  7/12:  S/p right TKR by DrOlin; he has been thru the PT & doing well, exercising 2d/wk at gym, on diet etc...  LOW BACK PAIN SYNDROME (ICD-724.2) - prev hx of T1-T2 surgery for HNP.[name]. recent eval by Susann Givens, DrTooke, DrWang... decided against surg and DrWang tried a shot to "burn" the nerve- "I'm 60% better"... still doing exercises and taking Celebrex- we decided to change to Mobic/ Tylenol Rx...  ANXIETY (ICD-300.00)   Past Medical History  Diagnosis Date  . Anxiety   . Low back pain syndrome   . DJD (degenerative joint disease)   . Nephrolithiasis   . BPH (benign prostatic hypertrophy) with urinary obstruction   . Cholelithiasis   . Hemorrhoids   . History of colonic polyps   . Diverticulosis of colon   . GERD (gastroesophageal reflux disease)   . Diabetes mellitus   . Hyperlipidemia   . History of DVT (deep vein thrombosis)   . Venous insufficiency   . Varicose veins of lower extremities   . Hypertension, benign   . pulmonary nodule, left lower lobe     Past Surgical History  Procedure Date  . Sebaceous cyst surgery   . Bilateral inguinal hernia repairs   . T1-t2 disc surgery 2007  by Dr. Otelia Sergeant  . Left ankle surgery with plate and screws 11/2007    by Dr. Lajoyce Corners    Outpatient Encounter Prescriptions as of 10/05/2010  Medication Sig Dispense Refill  . aspirin 81 MG tablet Take 81 mg by mouth daily.        . cetirizine (ZYRTEC) 10 MG tablet Take 10 mg by mouth daily.        . diclofenac (VOLTAREN) 75 MG EC tablet Take 1  tablet (75 mg total) by mouth 2 (two) times daily with a meal.  60 tablet  5  . glimepiride (AMARYL) 1 MG tablet TAKE 1 TABLET BY MOUTH ONCE DAILY IN THE MORNING  90 tablet  3  . lansoprazole (PREVACID) 30 MG capsule TAKE ONE CAPSULE ONCE DAILY  90 capsule  3  . metFORMIN (GLUCOPHAGE) 500 MG tablet TAKE 1 TABLET BY MOUTH TWICE A DAY  180 tablet  3  . mometasone (NASONEX) 50 MCG/ACT nasal spray 2 sprays by Nasal route at bedtime.        . multivitamin (THERAGRAN) per tablet Take 1 tablet by mouth daily.        . ranitidine (ZANTAC) 150 MG capsule Take 150 mg by mouth at bedtime.        . simvastatin (ZOCOR) 40 MG tablet Take 40 mg by mouth at bedtime.        . valsartan (DIOVAN) 160 MG tablet Take 160 mg by mouth daily. Take 1/2 tablet by daily       . DISCONTD: azithromycin (ZITHROMAX) 250 MG tablet Take 2 tablets by mouth on day 1, followed by 1 tablet by mouth daily for 4 days. .  6 each  0    Allergies  Allergen Reactions  . Ciprofloxacin     REACTION: rash  . Niacin     REACTION: hypertension, shaky    Current Medications, Allergies, Past Medical History, Past Surgical History, Family History, and Social History were reviewed in [employer] record.    Review of Systems        See HPI - all other systems neg except as noted... The patient complains of difficulty walking.  The patient denies anorexia, fever, weight loss, weight gain, vision loss, decreased hearing, hoarseness, chest pain, syncope, ch in dyspnea on exertion, peripheral edema, prolonged cough, headaches, hemoptysis, abdominal pain, melena, hematochezia, severe indigestion/heartburn, hematuria, incontinence, muscle weakness, suspicious skin lesions, transient blindness, depression, unusual weight change, abnormal bleeding, enlarged lymph nodes, and angioedema.     Objective:   Physical Exam     WD, Obese, 65 y/o WM in NAD... GENERAL:  Alert & oriented; pleasant & cooperative... HEENT:  [place]/AT,  EOM-wnl, PERRLA, EACs-clear, TMs-wnl, NOSE-clear, THROAT-clear & wnl. NECK:  Supple w/ fair ROM; no JVD; normal carotid impulses w/o bruits; no thyromegaly or nodules palpated; no lymphadenopathy. CHEST:  Clear to P & A; without wheezes/ rales/ or rhonchi. HEART:  Regular Rhythm; without murmurs/ rubs/ or gallops. ABDOMEN:  Obese, soft & nontender; normal bowel sounds; no organomegaly or masses detected. EXT: s/p bilat TKRs, mod arthritic changes & sl decr ROM knees; +varicose veins/ +venous insuffic/ tr edema. NEURO:  CN's intact; motor testing normal; sensory testing normal; gait normal & balance OK. DERM:  no rash etc...   Assessment & Plan:   HBP>  On Diovan 160mg tabs- 1/2 tab daily + low sodium/ wt reducing diet;  BP= 140/86 & similar at home; continue same med + diet, exercise, wt reduction.Marland KitchenMarland Kitchen  Chol>  On Simva40 & FLP 4/12 showed TChol 125, TG 141, HDL 29, LDL 68;  Good control on med but needs better low fat diet & incr exercise...     DM>  On Metform500Bid + Glimep1mg  Qam;  FBS=118 & A1c=6.0 now> w/ his wt reduction & incr exerc program- we will rec decr Glimep1mg  to 1/2 tab Qam...     Obese>  His weight is down 9# to 257#, 70" tall, BMI= 36-7;  We have discussed diet & exercise w/ each office visit but he's had difficulty w/ compliance due to his arthritis & hopefully the TKRs will allow for more vigorous exercise program...     GI>  Hx GERD, Divertics, polyps> last colon 2010 by [name] was neg & f/u planned 48yrs;  He takes Prevacid 30mg /d, and Zantac 150mg  Qhs...     GU>  Long hx kidney stones followed by DrPeterson & Dahlstadt;  NOTE- he is very allergic to Cipro;  Labs show BUN=17, Creat=1.0;  [name] placed him on Rapaflo to aide stone passing.     DJD>  As above, s/p bilat TKRs, done w/ rehab & into his exercise program, using Voltaren as needed.Marland KitchenMarland Kitchen

## 2010-10-05 NOTE — Patient Instructions (Signed)
Today we updated your meds in EPIC...    Continue your current meds the same and monitor your BP at home for [place]...  Today we did your follow up blood work...    Please call the [facility] in a few days for your results...    Dial [id number] & when prompted enter your patient number followed by the # symbol...    Your patient number is:  [id number]#  Keep up the good work w/ diet, exercise,  & wt reduction (now that you are the "bionic man")  Call for any problems...  Let's plan another follow up eval in 6months.Marland KitchenMarland Kitchen

## 2010-10-06 ENCOUNTER — Encounter: Payer: Self-pay | Admitting: Pulmonary Disease

## 2010-10-09 ENCOUNTER — Other Ambulatory Visit: Payer: Self-pay | Admitting: *Deleted

## 2010-10-09 MED ORDER — GLIMEPIRIDE 1 MG PO TABS: ORAL_TABLET | ORAL | Status: DC

## 2010-10-21 ENCOUNTER — Other Ambulatory Visit: Payer: Self-pay | Admitting: Pulmonary Disease

## 2010-11-10 ENCOUNTER — Other Ambulatory Visit: Payer: Self-pay | Admitting: *Deleted

## 2010-11-10 MED ORDER — DICLOFENAC SODIUM 75 MG PO TBEC: 75.0000 mg | DELAYED_RELEASE_TABLET | Freq: Two times a day (BID) | ORAL | Status: DC

## 2011-03-31 ENCOUNTER — Telehealth: Payer: Self-pay | Admitting: Pulmonary Disease

## 2011-03-31 MED ORDER — PANTOPRAZOLE SODIUM 40 MG PO TBEC: 40.0000 mg | DELAYED_RELEASE_TABLET | Freq: Every day | ORAL | Status: DC

## 2011-03-31 NOTE — Telephone Encounter (Signed)
rx has been sent--lmomtcb x1 for pt to make aware

## 2011-03-31 NOTE — Telephone Encounter (Signed)
Per SN---ok to change to protonix 40mg    1 daily.  thanks

## 2011-03-31 NOTE — Telephone Encounter (Signed)
I spoke with pt and he is requesting an alternative to the prevacid. He states it went up to $94. Please advise SN thanks

## 2011-04-01 NOTE — Telephone Encounter (Signed)
Pt aware.  , CMA  

## 2011-06-01 DIAGNOSIS — R3129 Other microscopic hematuria: Secondary | ICD-10-CM | POA: Diagnosis not present

## 2011-06-01 DIAGNOSIS — N201 Calculus of ureter: Secondary | ICD-10-CM | POA: Diagnosis not present

## 2011-06-01 DIAGNOSIS — R1084 Generalized abdominal pain: Secondary | ICD-10-CM | POA: Diagnosis not present

## 2011-06-01 DIAGNOSIS — N200 Calculus of kidney: Secondary | ICD-10-CM | POA: Diagnosis not present

## 2011-06-08 DIAGNOSIS — R972 Elevated prostate specific antigen [PSA]: Secondary | ICD-10-CM | POA: Diagnosis not present

## 2011-06-08 DIAGNOSIS — N200 Calculus of kidney: Secondary | ICD-10-CM | POA: Diagnosis not present

## 2011-06-08 DIAGNOSIS — N401 Enlarged prostate with lower urinary tract symptoms: Secondary | ICD-10-CM | POA: Diagnosis not present

## 2011-06-08 DIAGNOSIS — N201 Calculus of ureter: Secondary | ICD-10-CM | POA: Diagnosis not present

## 2011-09-13 ENCOUNTER — Ambulatory Visit (INDEPENDENT_AMBULATORY_CARE_PROVIDER_SITE_OTHER): Payer: Medicare Other

## 2011-09-13 DIAGNOSIS — Z23 Encounter for immunization: Secondary | ICD-10-CM

## 2011-09-14 DIAGNOSIS — Z23 Encounter for immunization: Secondary | ICD-10-CM | POA: Diagnosis not present

## 2011-10-01 ENCOUNTER — Other Ambulatory Visit: Payer: Self-pay | Admitting: Pulmonary Disease

## 2011-10-07 ENCOUNTER — Other Ambulatory Visit: Payer: Self-pay | Admitting: Pulmonary Disease

## 2011-10-08 ENCOUNTER — Other Ambulatory Visit: Payer: Self-pay | Admitting: Pulmonary Disease

## 2011-10-11 ENCOUNTER — Other Ambulatory Visit: Payer: Self-pay | Admitting: Pulmonary Disease

## 2011-10-14 ENCOUNTER — Other Ambulatory Visit: Payer: Self-pay | Admitting: Pulmonary Disease

## 2011-10-15 ENCOUNTER — Telehealth: Payer: Self-pay | Admitting: Pulmonary Disease

## 2011-10-15 MED ORDER — SIMVASTATIN 40 MG PO TABS: 40.0000 mg | ORAL_TABLET | Freq: Every day | ORAL | Status: DC

## 2011-10-15 MED ORDER — METFORMIN HCL 500 MG PO TABS: 500.0000 mg | ORAL_TABLET | Freq: Two times a day (BID) | ORAL | Status: DC

## 2011-10-15 MED ORDER — VALSARTAN 160 MG PO TABS: ORAL_TABLET | ORAL | Status: DC

## 2011-10-15 MED ORDER — GLIMEPIRIDE 1 MG PO TABS: ORAL_TABLET | ORAL | Status: DC

## 2011-10-15 NOTE — Telephone Encounter (Signed)
Refills sent. Pt is aware. Jennifer Castillo, CMA  

## 2011-11-10 ENCOUNTER — Telehealth: Payer: Self-pay | Admitting: Pulmonary Disease

## 2011-11-10 NOTE — Telephone Encounter (Signed)
Spoke with patient-appt made with TP Thursday 11-11-11 at 9:15am.

## 2011-11-11 ENCOUNTER — Encounter: Payer: Self-pay | Admitting: Adult Health

## 2011-11-11 ENCOUNTER — Ambulatory Visit (INDEPENDENT_AMBULATORY_CARE_PROVIDER_SITE_OTHER): Payer: Medicare Other | Admitting: Adult Health

## 2011-11-11 VITALS — BP 126/84 | HR 83 | Temp 97.0°F | Ht 70.0 in | Wt 263.4 lb

## 2011-11-11 DIAGNOSIS — J069 Acute upper respiratory infection, unspecified: Secondary | ICD-10-CM | POA: Diagnosis not present

## 2011-11-11 MED ORDER — AZITHROMYCIN 250 MG PO TABS: ORAL_TABLET | ORAL | Status: AC

## 2011-11-11 MED ORDER — GLIMEPIRIDE 1 MG PO TABS: ORAL_TABLET | ORAL | Status: DC

## 2011-11-11 MED ORDER — HYDROCODONE-HOMATROPINE 5-1.5 MG/5ML PO SYRP: 5.0000 mL | ORAL_SOLUTION | Freq: Four times a day (QID) | ORAL | Status: AC | PRN

## 2011-11-11 NOTE — Progress Notes (Signed)
  Subjective:    Patient ID: SENAI KINGSLEY, male    DOB: [birth year], 66 y.o.   MRN: [id number]  HPI 66 year old male with a known history of diabetes, hypertension, and hyperlipidemia.  11/11/2011 acute office visit Pt c/o congestion, sore throat, runny nose, PND, prod. cough with yellow mucus at times, sweats and chills x 3 days Denies fever.  Leaving for the beach next week  Has physical next week.  No hemoptysis , chest pain, orthopnea or rash.   Review of Systems Constitutional:   No  weight loss, night sweats,  Fevers, chills,  +fatigue, or  lassitude.  HEENT:   No headaches,  Difficulty swallowing,  Tooth/dental problems, or  Sore throat,                No sneezing, itching, ear ache,  +nasal congestion, post nasal drip,   CV:  No chest pain,  Orthopnea, PND, swelling in lower extremities, anasarca, dizziness, palpitations, syncope.   GI  No heartburn, indigestion, abdominal pain, nausea, vomiting, diarrhea, change in bowel habits, loss of appetite, bloody stools.   Resp:   No coughing up of blood.  No change in color of mucus.  No wheezing.  No chest wall deformity  Skin: no rash or lesions.  GU: no dysuria, change in color of urine, no urgency or frequency.  No flank pain, no hematuria   MS:  No joint pain or swelling.  No decreased range of motion.  No back pain.  Psych:  No change in mood or affect. No depression or anxiety.  No memory loss.         Objective:   Physical Exam GEN: A/Ox3; pleasant , NAD, well nourished   HEENT:  [place]/AT,  EACs-clear, TMs-wnl, NOSE-clear drainage , THROAT-clear, no lesions, no postnasal drip or exudate noted.   NECK:  Supple w/ fair ROM; no JVD; normal carotid impulses w/o bruits; no thyromegaly or nodules palpated; no lymphadenopathy.  RESP  Clear  P & A; w/o, wheezes/ rales/ or rhonchi.no accessory muscle use, no dullness to percussion  CARD:  RRR, no m/r/g  , no peripheral edema, pulses intact, no cyanosis or clubbing.  GI:    Soft & nt; nml bowel sounds; no organomegaly or masses detected.  Musco: Warm bil, no deformities or joint swelling noted.   Neuro: alert, no focal deficits noted.    Skin: Warm, no lesions or rashes         Assessment & Plan:

## 2011-11-11 NOTE — Assessment & Plan Note (Signed)
Flare   Plan  Z-Pak to have on hold if symptoms worsen with discolored mucus. Mucinex DM twice daily as needed. For cough and congestion. Fluids and rest. Tylenol as needed. Hydromet 1-2 teaspoons every 4-6 hours as needed. For cough, may cause sedation. Please contact office for sooner follow up if symptoms do not improve or worsen or seek emergency care

## 2011-11-11 NOTE — Patient Instructions (Addendum)
Z-Pak to have on hold if symptoms worsen with discolored mucus. Mucinex DM twice daily as needed. For cough and congestion. Fluids and rest. Tylenol as needed. Hydromet 1-2 teaspoons every 4-6 hours as needed. For cough, may cause sedation. Please contact office for sooner follow up if symptoms do not improve or worsen or seek emergency care

## 2011-11-13 ENCOUNTER — Other Ambulatory Visit: Payer: Self-pay | Admitting: Pulmonary Disease

## 2011-11-14 ENCOUNTER — Other Ambulatory Visit: Payer: Self-pay | Admitting: Pulmonary Disease

## 2011-11-23 ENCOUNTER — Ambulatory Visit (INDEPENDENT_AMBULATORY_CARE_PROVIDER_SITE_OTHER): Payer: Medicare Other | Admitting: Pulmonary Disease

## 2011-11-23 ENCOUNTER — Encounter: Payer: Self-pay | Admitting: Pulmonary Disease

## 2011-11-23 ENCOUNTER — Other Ambulatory Visit (INDEPENDENT_AMBULATORY_CARE_PROVIDER_SITE_OTHER): Payer: Medicare Other

## 2011-11-23 ENCOUNTER — Encounter: Payer: Self-pay | Admitting: *Deleted

## 2011-11-23 VITALS — BP 126/78 | HR 94 | Temp 97.6°F | Ht 70.0 in | Wt 258.8 lb

## 2011-11-23 DIAGNOSIS — N401 Enlarged prostate with lower urinary tract symptoms: Secondary | ICD-10-CM | POA: Diagnosis not present

## 2011-11-23 DIAGNOSIS — I10 Essential (primary) hypertension: Secondary | ICD-10-CM

## 2011-11-23 DIAGNOSIS — E785 Hyperlipidemia, unspecified: Secondary | ICD-10-CM | POA: Diagnosis not present

## 2011-11-23 DIAGNOSIS — I8390 Asymptomatic varicose veins of unspecified lower extremity: Secondary | ICD-10-CM

## 2011-11-23 DIAGNOSIS — K5730 Diverticulosis of large intestine without perforation or abscess without bleeding: Secondary | ICD-10-CM

## 2011-11-23 DIAGNOSIS — M545 Low back pain, unspecified: Secondary | ICD-10-CM

## 2011-11-23 DIAGNOSIS — F419 Anxiety disorder, unspecified: Secondary | ICD-10-CM

## 2011-11-23 DIAGNOSIS — K8020 Calculus of gallbladder without cholecystitis without obstruction: Secondary | ICD-10-CM

## 2011-11-23 DIAGNOSIS — M1990 Unspecified osteoarthritis, unspecified site: Secondary | ICD-10-CM

## 2011-11-23 DIAGNOSIS — E119 Type 2 diabetes mellitus without complications: Secondary | ICD-10-CM

## 2011-11-23 DIAGNOSIS — I872 Venous insufficiency (chronic) (peripheral): Secondary | ICD-10-CM | POA: Diagnosis not present

## 2011-11-23 DIAGNOSIS — N200 Calculus of kidney: Secondary | ICD-10-CM

## 2011-11-23 DIAGNOSIS — D126 Benign neoplasm of colon, unspecified: Secondary | ICD-10-CM

## 2011-11-23 DIAGNOSIS — F411 Generalized anxiety disorder: Secondary | ICD-10-CM

## 2011-11-23 DIAGNOSIS — K219 Gastro-esophageal reflux disease without esophagitis: Secondary | ICD-10-CM

## 2011-11-23 DIAGNOSIS — N138 Other obstructive and reflux uropathy: Secondary | ICD-10-CM

## 2011-11-23 LAB — CBC WITH DIFFERENTIAL/PLATELET
Basophils Absolute: 0 10*3/uL (ref 0.0–0.1)
HCT: 45.9 % (ref 39.0–52.0)
Hemoglobin: 14.6 g/dL (ref 13.0–17.0)
Lymphs Abs: 2.5 10*3/uL (ref 0.7–4.0)
MCV: 80 fl (ref 78.0–100.0)
Monocytes Absolute: 1.2 10*3/uL — ABNORMAL HIGH (ref 0.1–1.0)
Monocytes Relative: 12.5 % — ABNORMAL HIGH (ref 3.0–12.0)
Neutro Abs: 5.6 10*3/uL (ref 1.4–7.7)
Platelets: 345 10*3/uL (ref 150.0–400.0)
RDW: 16.7 % — ABNORMAL HIGH (ref 11.5–14.6)

## 2011-11-23 LAB — HEPATIC FUNCTION PANEL
AST: 39 U/L — ABNORMAL HIGH (ref 0–37)
Albumin: 4.3 g/dL (ref 3.5–5.2)
Alkaline Phosphatase: 49 U/L (ref 39–117)
Bilirubin, Direct: 0.2 mg/dL (ref 0.0–0.3)
Total Bilirubin: 0.7 mg/dL (ref 0.3–1.2)

## 2011-11-23 LAB — BASIC METABOLIC PANEL
Calcium: 9.4 mg/dL (ref 8.4–10.5)
GFR: 60.69 mL/min (ref >60.00)
Glucose, Bld: 164 mg/dL — ABNORMAL HIGH (ref 70–99)
Potassium: 5.5 mEq/L — ABNORMAL HIGH (ref 3.5–5.1)
Sodium: 138 mEq/L (ref 135–145)

## 2011-11-23 LAB — LIPID PANEL
HDL: 32.3 mg/dL — ABNORMAL LOW (ref >39.00)
LDL Cholesterol: 64 mg/dL (ref 0–99)
Total CHOL/HDL Ratio: 4
VLDL: 32.4 mg/dL (ref 0.0–40.0)

## 2011-11-23 LAB — TSH: TSH: 0.41 u[IU]/mL (ref 0.35–5.50)

## 2011-11-23 MED ORDER — SIMVASTATIN 40 MG PO TABS: 40.0000 mg | ORAL_TABLET | Freq: Every day | ORAL | Status: DC

## 2011-11-23 MED ORDER — DICLOFENAC SODIUM 75 MG PO TBEC: 75.0000 mg | DELAYED_RELEASE_TABLET | Freq: Two times a day (BID) | ORAL | Status: DC

## 2011-11-23 MED ORDER — METFORMIN HCL 500 MG PO TABS: 500.0000 mg | ORAL_TABLET | Freq: Two times a day (BID) | ORAL | Status: DC

## 2011-11-23 MED ORDER — VALSARTAN 160 MG PO TABS: ORAL_TABLET | ORAL | Status: DC

## 2011-11-23 NOTE — Patient Instructions (Addendum)
Today we updated your med list in our EPIC system...    Continue your current medications the same...    We refilled your meds per request...  Today we did your FASTING blood work...    We will contact you w/ the results when avail...  Keep up the good work w/ your exercise regime...    Let's get on track w/ our diet program as well...  Call for any questions...  Let's plan a similar follow up visit in 50yr, but call anytime if needed for problems.Marland KitchenMarland Kitchen

## 2011-11-23 NOTE — Progress Notes (Signed)
Subjective:    Patient ID: [name], male    DOB: [birth year], 66 y.o.   MRN: [id number]  HPI 66 y/o WM here for a follow up visit... he has multiple medical problems including:  HBP;  VV/ VI/ hx DVT;  Hyperlipidemia;  DM;  Obesity;  GERD/ Divertics/ Colon polyps/ Hems;  Gallstone;  BPH/ Bladder outlet obstruction;  Kidney stones;  DJD/ LBP;  Anxiety...  ~  September 15, 2009:  he's had a busy 227mo- left knee arthroscopy by DrSupple 2 wks ago & kidney stone surg DrPeterson 7/11 w/ cysto, ureteroscopy, laser frag & stent;  then f/u Urology 8/11 for this & his BPH, incr PSA & ED> he says PSA was "OK"...  BP is controlled on Diovan;  FLP looks good on Simva40;  DM reasonable on his Metform+Glimep as he needs to lose weight to affect better control!...  daughter wants him to have Rheum consult;  he wants handicap sticker & 90d refills;  OK 2011 Flu vaccine.  ~  April 07, 2010:  63mo ROV & he is sched for left TKR by DrOlin soon;  Asked to check pt pre-op for hx DVT & assess risk w/ +FamHx> pt had DVT 2007 after back surg & was treated w/ Hep/ Coumadin- discontinued after 1 yr & no recurrence;  He subseq had broken ankle after a fall in 2009 w/ surg DrDuda & no prob in the post op period;  Presented w/ some leg swelling in 2010 & Ven Dopplers were neg for DVT, treated w/ sodium restriction, elevation, support hose;  He had left knee arthroscopy 8/11 by DrSupple followed by 5d Lovenox Rx & no prob w/ that approach;  As far as the [name] is concerned- Mother is the only family member w/ blood clots (she is still alive at 70- hx DM improved w/ wt reduction, DJD, Gout, VV, and hx DVT in past on Coumadin for yrs);  NOTE> Ven Dopplers done 03/25/10 showed +VV, no DVT;  His risk is elev due to his prev hx but not extreme & we will check Hypercoag Panel for completeness> he should be OK w/ the standard DVT prophylaxis after knee surg...    HBP>  On Diovan 160mg tabs- 1/2 tab daily + low sodium/ wt reducing diet;  BP=  120/70 & similar at home, tol med well & denies CP/ palpit/ dizzy/ SOB/ incr edema etc...    Chol>  On Simva40 & FLP shows TChol 125, TG 141, HDL 29, LDL 68;  Good control on med but needs better low fat diet & incr exercise...    DM>  On Metform500Bid + Glimep1mg  Qam;  FBS=102 (pre-op labs) & A1c=7.2> pt rec to continue same meds & get on better low carb, wt reducing diet...    Obese>  His weight is 266#, 70" tall, BMI= 38;  We have discussed diet & exercise w/ each office visit but he's had difficulty w/ compliance due to his arthritis & hopefully the TKR will allow for more vigorous exercise program...    GI>  Hx GERD, Divertics, polyps> last colon 2010 by [name] was neg & f/u planned 23yrs;  He takes Prevacid 30mg /d, and Zantac 150mg  Qhs...    GU>  Long hx kidney stones followed by DrPeterson & Dahlstadt;  Pre-op labs showed UTI & he's been started on SeptraDS, NOTE- he is very allergic to Cipro;  Labs showed BUN=19, Creat=1.13  ~  October 05, 2010:  227mo ROV & [name] has been busy again  in the interval- s/p left TKR by DrOlin 4/12 & s/p right TKR 7/12; by all accounts he did beautifully, no complic, went thru rehab & now into exercise program (off Hydrocodone, using Voltaren prn); weight down 9# and BS/A1c improved (on Metformin, Amaryl); he reports passing several small kidney stones as well & is taking Rapaflo from Urology...    HBP>  On Diovan 160mg tabs- 1/2 tab daily + low sodium/ wt reducing diet;  BP= 140/86 & similar at home, tol med well & denies CP/ palpit/ dizzy/ SOB/ incr edema etc...    Chol>  On Simva40 & FLP 4/12 showed TChol 125, TG 141, HDL 29, LDL 68;  Good control on med but needs better low fat diet & incr exercise...    DM>  On Metform500Bid + Glimep1mg  Qam;  FBS=118 & A1c=6.0 now> w/ his wt reduction & incr exerc program- we will rec decr Glimep1mg  to 1/2 tab Qam...    Obese>  His weight is down 9# to 257#, 70" tall, BMI= 36-7;  We have discussed diet & exercise w/ each office  visit but he's had difficulty w/ compliance due to his arthritis & hopefully the TKRs will allow for more vigorous exercise program...    GI>  Hx GERD, Divertics, polyps> last colon 2010 by [name] was neg & f/u planned 106yrs;  He takes Prevacid 30mg /d, and Zantac 150mg  Qhs...    GU>  Long hx kidney stones followed by DrPeterson & Dahlstadt;  NOTE- he is very allergic to Cipro;  Labs show BUN=17, Creat=1.0;  [name] placed him on Rapaflo to aide stone passing.    DJD>  As above, s/p bilat TKRs, done w/ rehab & into his exercise program, using Voltaren as needed...  ~  November 23, 2011:  Yearly ROV & [name] had a recent URI- treated w/ ZPak, Mucinex, Hydromet & improving;  otherw reports a good yr, feeling well, no new complaints or concerns- just unable to lose the weight 7 we reviewed DIET, EXERCISE, wt reduction strategies...    HBP>  On Diovan 160mg tabs-1/2 tab daily + low sodium/ wt reducing diet;  BP= 126/78 & similar at home & denies CP/ palpit/ dizzy/ SOB/ incr edema etc...    Chol>  On Simva40 & FLP 11/13 showed TChol 129, TG 162, HDL 32, LDL 64;  Good control on med but needs better low fat diet & incr exercise...    DM>  On Metform500Bid + Glimep1mg -1/2;  FBS=164 & A1c=7.7 now> needs better diet, wt reduction & incr Glimep2mg  Qam...    Obese>  His weight is 259#, 70" tall, BMI= 36-7;  We have discussed diet & exercise w/ each office visit but he's had difficulty w/ compliance due to his arthritis & hopefully the TKRs will allow for more vigorous exercise program...    GI>  Hx GERD, Divertics, polyps> last colon 2010 by [name] was neg & f/u planned 84yrs;  He takes PROTONIX 40mg /d, and Zantac 150mg  Qhs...    GU>  Long hx kidney stones followed by DrPeterson & Dahlstadt;  NOTE- he is very allergic to Cipro;  Labs show BUN=17, Creat=1.0;  [name] placed him on Rapaflo to aide stone passing & he notes this really helps...    DJD, s/p bilat TKRs, LBP>  As above, s/p bilat TKRs, done w/  rehab & into his exercise program, using Voltaren as needed... We reviewed prob list, meds, xrays and labs> see below for updates >> he had Flu vaccine in Sept; wants refills for  90d supplies...  LABS 11/13:  FLP- ok on Simva40 but TG=162 needs better low fat diet; Chems- ok x BS=164 A1c=7.7 & sl incr LFTs;  CBC- wnl;  TSH=0.41;  PSA=6.04 & refer to Urol.          Problem List:  ? of PULMONARY NODULE, LEFT LOWER LOBE (ICD-518.89) - CXR 3/10 showed ?nodular density on lat film & subseq CT Chest showed that this was an osteophyte, & also displayed some basilar fibrosis, 4mm RLL nodule, & 3cm right adrenal adenoma... ~  CXR 4/12 showed boderline heart size, clear lungs, large osteophytes in lower spine appear stable...  HYPERTENSION, BENIGN (ICD-401.1) - on ASA 81mg /d,  DIOVAN 160mg - 1/2 tab/d... ~  NuclearStressTest for surg clearance 1/09 was neg- ? soft tissue atten ant, no ischemia, EF= 57%... ~  EKG in ER 5/10 showed NSR, no ectopy, IVCD, LAD, no acute changes. ~  EKG 4/12 showed NSR, LAD, otherw wnl... ~  11/13:  BP= 126/78 & he remains asymptomatic...  VARICOSE VEINS, LOWER EXTREMITIES (ICD-454.9) VENOUS INSUFFICIENCY (ICD-459.81) Hx of DEEP VEIN THROMBOSIS (ICD-453.8) - he has VV & VI as baseline> hx left leg DVT after neck surgery 2007 treated w/ coumadin; no recurrent prob after left leg fracture w/ surg 11/09 by Tyna Jaksch... ~  4/12:  Pre-op assessment for TKR> neg Ven Dopplers & neg hypercoagulable panel; OK for surg... ~  10/12:  He had left TKR 4/12 & right TKR 7/12; no complic, went thru rehab & now exercising on his own...  HYPERLIPIDEMIA (ICD-272.4) - on SIMVASTATIN 40mg /d... ~  FLP 2/09 on Simva40 showed TChol 109, TG 100, HDL 32, LDL 57... continue med, better diet, get wt down! ~  FLP 2/10 on Simva40 showed TChol 103, TG 69, HDL 34, LDL 56 ~  FLP 3/11 on Simva40 showed TChol 114, TG 105, HDL 38, LDL 55 ~  FLP 9/11 on Simva40 showed TChol 128, TG 134, HDL 30, LDL 72... needs  incr exerc & get wt down. ~  FLP 4/12 on Simva40 showed TChol 128, TG 141, HDL 29, LDL 68 ~  FLP 11/13 on Simva40 showed TChol 129, TG 162, HDL 32, LDL 64  DIABETES MELLITUS (ICD-250.00) - on METFORMIN 500mg Bid & GLIMEPIRIDE 1mg /d (added 9/10)... he has been unable to lose weight! ~  labs 7/08 showed BS=117, HgA1c=6.5.[name]. rec> same meds, better diet... ~  labs 2/09 showed BS=120... rec> same med, better diet and get weight down!! ~  labs 2/10 showed BS= 145, A1c= 7.1 ~  labs 9/10 showed BS= 199, A1c= 7.8.[name]. rec> added GLIMEPIRIDE 1mg /d. ~  labs 3/11 showed BS= 149, A1c= 6.8, weight = 268#, 70" tall, BMI= 38.5 ~  labs 9/11 showed BS= 135, A1c= 7.1 ~  Labs 4/12 showed BS= 102, A1c=7.2 ~  Labs 10/12 showed BS= 118, A1c= 6.0.Marland KitchenMarland Kitchen Post op TKRs, exercising, wt down 9#, REC to decr Glimep1mg - 1/2 tab Qam. ~  Labs 11/13 showed BS= 164, A1c= 7.7.Marland KitchenMarland Kitchen Rec to incr the Glimep to 2mg  Qam...  G E R D (ICD-530.81) - he takes PROTONIX 40mg /d, and RANITADINE 150mg  Qhs...  ~  he had EGD by DrPatterson 1994 showing gastritis ?due to NSAIDs, Rx'd Zantac...  DIVERTICULOSIS OF COLON (ICD-562.10) HEMORRHOIDS (ICD-455.6) COLONIC POLYPS (ICD-211.3) ~  colonoscopy 5/01 by [name] showed sm polyps (hyperplastic), divertics, hems, & ileal inflamm- neg bx...  ~  colonoscopy 7/10 by [name]- divertics, hems, diminutive polyp in transverse colon w/ bx= polypoid mucosa, no adenoma, f/u planned 10y.   CHOLELITHIASIS (ICD-574.20) -  gallstone noted on sonar 8/04... no symptoms- denies n/v, abd pain, etc... ~  Labs 11/13 showed SGOT=39 and SGPT=55 compatible w/ Fatty Liver disease & he is asked to GET THE WEIGHT DOWN!!!  BENIGN PROSTATIC HYPERTROPHY, WITH OBSTRUCTION (ICD-600.01) - neg biopsies 10/06 by DrDahlstedt... ~  9/10: pt states sl incr PSA's followed by DrPeterson Q25mo- we don't have their lab data. ~  11/13:  PSA here = 6.04 & he is referred back to DrDahlstedt to consider prostate bx...  NEPHROLITHIASIS (ICD-592.0)  - followed by DrPeterson> "I've had them ever since I was 16"... ~  CT Abd 7/09 by Urology showed bilat non-obstructing stones, incidental 2-3cm right adrenal adenoma, DJD Lspine. ~  2/10: states he passed a sm stone on his own... ~  7/11:  s/p cysto, ureteroscopy, laser frag & stent for yet another stone. ~  10/12:  He reports passing several sm stones over the last few mo, on Rapaflo per [name]... ~  11/13:  He tells me that he continues to pass stones on & off...  DEGENERATIVE JOINT DISEASE (ICD-715.90) - c/o bilat knee pain> he saw DrSupple for Ortho w/ shot in right knee 2010 (helped); shot didn't help on left- therefore left knee arthroscopy 8/11... ~  4/12:  S/p left TKR by DrOlin... ~  7/12:  S/p right TKR by DrOlin; he has been thru the PT & doing well, exercising 2d/wk at gym, on diet etc...  LOW BACK PAIN SYNDROME (ICD-724.2) - prev hx of T1-T2 surgery for HNP.[name]. recent eval by Susann Givens, DrTooke, DrWang... decided against surg and DrWang tried a shot to "burn" the nerve- "I'm 60% better"... still doing exercises and taking Celebrex- we decided to change to Mobic/ Tylenol Rx...  ANXIETY (ICD-300.00)   Past Medical History  Diagnosis Date  . Anxiety   . Low back pain syndrome   . DJD (degenerative joint disease)   . Nephrolithiasis   . BPH (benign prostatic hypertrophy) with urinary obstruction   . Cholelithiasis   . Hemorrhoids   . History of colonic polyps   . Diverticulosis of colon   . GERD (gastroesophageal reflux disease)   . Diabetes mellitus   . Hyperlipidemia   . History of DVT (deep vein thrombosis)   . Venous insufficiency   . Varicose veins of lower extremities   . Hypertension, benign   . pulmonary nodule, left lower lobe     Past Surgical History  Procedure Date  . Sebaceous cyst surgery   . Bilateral inguinal hernia repairs   . T1-t2 disc surgery 2007    by Dr. Otelia Sergeant  . Left ankle surgery with plate and screws 11/2007    by Dr. Lajoyce Corners     Outpatient Encounter Prescriptions as of 11/23/2011  Medication Sig Dispense Refill  . aspirin 81 MG tablet Take 81 mg by mouth daily.        . cetirizine (ZYRTEC) 10 MG tablet Take 10 mg by mouth daily.        . diclofenac (VOLTAREN) 75 MG EC tablet Take 1 tablet (75 mg total) by mouth 2 (two) times daily with a meal.  180 tablet  3  . glimepiride (AMARYL) 1 MG tablet Take 1/2 tablet by mouth every morning at breakfast  30 tablet  11  . metFORMIN (GLUCOPHAGE) 500 MG tablet TAKE 1 TABLET (500 MG TOTAL) BY MOUTH 2 (TWO) TIMES DAILY WITH A MEAL.  60 tablet  0  . multivitamin (THERAGRAN) per tablet Take 1 tablet by mouth daily.        [name]  pantoprazole (PROTONIX) 40 MG tablet TAKE 1 TABLET (40 MG TOTAL) BY MOUTH DAILY.  30 tablet  5  . ranitidine (ZANTAC) 150 MG capsule Take 150 mg by mouth at bedtime.        . silodosin (RAPAFLO) 4 MG CAPS capsule Take 4 mg by mouth daily with breakfast.       . simvastatin (ZOCOR) 40 MG tablet TAKE 1 TABLET (40 MG TOTAL) BY MOUTH AT BEDTIME.  30 tablet  0  . valsartan (DIOVAN) 160 MG tablet Take 1/2 tablet by daily  30 tablet  0    Allergies  Allergen Reactions  . Ciprofloxacin     REACTION: rash  . Niacin     REACTION: hypertension, shaky    Current Medications, Allergies, Past Medical History, Past Surgical History, Family History, and Social History were reviewed in [employer] record.    Review of Systems        See HPI - all other systems neg except as noted... The patient complains of difficulty walking.  The patient denies anorexia, fever, weight loss, weight gain, vision loss, decreased hearing, hoarseness, chest pain, syncope, ch in dyspnea on exertion, peripheral edema, prolonged cough, headaches, hemoptysis, abdominal pain, melena, hematochezia, severe indigestion/heartburn, hematuria, incontinence, muscle weakness, suspicious skin lesions, transient blindness, depression, unusual weight change, abnormal bleeding,  enlarged lymph nodes, and angioedema.     Objective:   Physical Exam     WD, Obese, 66 y/o WM in NAD... GENERAL:  Alert & oriented; pleasant & cooperative... HEENT:  [place]/AT, EOM-wnl, PERRLA, EACs-clear, TMs-wnl, NOSE-clear, THROAT-clear & wnl. NECK:  Supple w/ fair ROM; no JVD; normal carotid impulses w/o bruits; no thyromegaly or nodules palpated; no lymphadenopathy. CHEST:  Clear to P & A; without wheezes/ rales/ or rhonchi. HEART:  Regular Rhythm; without murmurs/ rubs/ or gallops. ABDOMEN:  Obese, soft & nontender; normal bowel sounds; no organomegaly or masses detected. EXT: s/p bilat TKRs, mod arthritic changes & sl decr ROM knees; +varicose veins/ +venous insuffic/ tr edema. NEURO:  CN's intact; motor testing normal; sensory testing normal; gait normal & balance OK. DERM:  no rash etc...  RADIOLOGY DATA:  Reviewed in the EPIC EMR & discussed w/ the patient...  LABORATORY DATA:  Reviewed in the EPIC EMR & discussed w/ the patient...   Assessment & Plan:    HBP>  On Diovan 160mg tabs- 1/2 tab daily + low sodium/ wt reducing diet;  BP conjtrolled, continue same med + diet, exercise, wt reduction...     Chol>  On Simva40 & FLP looks good control on med but needs better low fat diet & incr exercise...     DM>  On Metform500Bid + Glimep w/ A1c back up to 7.7, needs better diet, wt reduction & incr Glim to 2mg /d...     Obese>  His weight is 259#, 70" tall, BMI= 36-7;  We have discussed diet & exercise w/ each office visit but he's had difficulty w/ compliance due to his arthritis & hopefully the TKRs will allow for more vigorous exercise program...     GI> Hx GERD, Divertics, polyps> last colon 2010 by [name] was neg & f/u planned 61yrs;  He takes Protonix 40mg /d, and Zantac 150mg  Qhs...     GU>  Long hx kidney stones followed by DrPeterson & Dahlstadt;  NOTE- he is very allergic to Cipro;  Labs show BUN=17, Creat=1.0;  [name] placed him on Rapaflo to aide stone passing; PSA is  up to 6 & needs f/u  by Urology...     DJD>  As above, s/p bilat TKRs, done w/ rehab & into his exercise program, using Voltaren as needed...   Patient's Medications  New Prescriptions   GLIMEPIRIDE (AMARYL) 2 MG TABLET    Take 1 tablet (2 mg total) by mouth daily before breakfast.  Previous Medications   ASPIRIN 81 MG TABLET    Take 81 mg by mouth daily.     CETIRIZINE (ZYRTEC) 10 MG TABLET    Take 10 mg by mouth daily.     MULTIVITAMIN (THERAGRAN) PER TABLET    Take 1 tablet by mouth daily.     PANTOPRAZOLE (PROTONIX) 40 MG TABLET    TAKE 1 TABLET (40 MG TOTAL) BY MOUTH DAILY.   RANITIDINE (ZANTAC) 150 MG CAPSULE    Take 150 mg by mouth at bedtime.     SILODOSIN (RAPAFLO) 4 MG CAPS CAPSULE    Take 4 mg by mouth daily with breakfast.   Modified Medications   Modified Medication Previous Medication   DICLOFENAC (VOLTAREN) 75 MG EC TABLET diclofenac (VOLTAREN) 75 MG EC tablet      Take 1 tablet (75 mg total) by mouth 2 (two) times daily with a meal.    Take 1 tablet (75 mg total) by mouth 2 (two) times daily with a meal.   METFORMIN (GLUCOPHAGE) 500 MG TABLET metFORMIN (GLUCOPHAGE) 500 MG tablet      Take 1 tablet (500 mg total) by mouth 2 (two) times daily with a meal.    TAKE 1 TABLET (500 MG TOTAL) BY MOUTH 2 (TWO) TIMES DAILY WITH A MEAL.   SIMVASTATIN (ZOCOR) 40 MG TABLET simvastatin (ZOCOR) 40 MG tablet      Take 1 tablet (40 mg total) by mouth at bedtime.    TAKE 1 TABLET (40 MG TOTAL) BY MOUTH AT BEDTIME.   VALSARTAN (DIOVAN) 160 MG TABLET valsartan (DIOVAN) 160 MG tablet      Take 1/2 tablet by daily    Take 1/2 tablet by daily  Discontinued Medications   GLIMEPIRIDE (AMARYL) 1 MG TABLET    Take 1/2 tablet by mouth every morning at breakfast

## 2011-11-24 ENCOUNTER — Encounter: Payer: Self-pay | Admitting: *Deleted

## 2011-11-24 DIAGNOSIS — E119 Type 2 diabetes mellitus without complications: Secondary | ICD-10-CM | POA: Insufficient documentation

## 2011-11-25 ENCOUNTER — Other Ambulatory Visit: Payer: Self-pay | Admitting: Pulmonary Disease

## 2011-11-25 MED ORDER — GLIMEPIRIDE 2 MG PO TABS: 2.0000 mg | ORAL_TABLET | Freq: Every day | ORAL | Status: DC

## 2011-11-26 ENCOUNTER — Encounter: Payer: Self-pay | Admitting: Pulmonary Disease

## 2011-12-07 DIAGNOSIS — N401 Enlarged prostate with lower urinary tract symptoms: Secondary | ICD-10-CM | POA: Diagnosis not present

## 2011-12-07 DIAGNOSIS — R972 Elevated prostate specific antigen [PSA]: Secondary | ICD-10-CM | POA: Diagnosis not present

## 2011-12-07 DIAGNOSIS — N138 Other obstructive and reflux uropathy: Secondary | ICD-10-CM | POA: Diagnosis not present

## 2011-12-14 ENCOUNTER — Telehealth: Payer: Self-pay | Admitting: Pulmonary Disease

## 2011-12-14 DIAGNOSIS — N529 Male erectile dysfunction, unspecified: Secondary | ICD-10-CM | POA: Diagnosis not present

## 2011-12-14 DIAGNOSIS — N200 Calculus of kidney: Secondary | ICD-10-CM | POA: Diagnosis not present

## 2011-12-14 DIAGNOSIS — N486 Induration penis plastica: Secondary | ICD-10-CM | POA: Diagnosis not present

## 2011-12-14 DIAGNOSIS — R972 Elevated prostate specific antigen [PSA]: Secondary | ICD-10-CM | POA: Diagnosis not present

## 2011-12-14 NOTE — Telephone Encounter (Signed)
(  continued)  Pt would like to ensure that he is taking the correct dosage.  Would like to make sure that he is to be taking 2 mg per day instead of 1 mg after increase (originally 1/2 mg per day).  Antionette Fairy

## 2011-12-14 NOTE — Telephone Encounter (Signed)
LMOMTCB x 1 

## 2011-12-14 NOTE — Telephone Encounter (Signed)
  Glimep1mg -1/2; FBS=164 & A1c=7.7 now> needs better diet, wt reduction & incr Glimep2mg  Qam Pt wants to know if he should be on a 2 mg since he was only taking a half of a dose of a 1 mg. Dr Kriste Basque please advise. Thank you .

## 2011-12-14 NOTE — Telephone Encounter (Signed)
Per SN---what he needs to do is diet, exercise, and weight reduction.   Ok to take the glimepiride 2 mg   Start with 1/2 tablet daily of the 2 mg.  thanks

## 2011-12-15 NOTE — Telephone Encounter (Signed)
Pt notified of SN recs and verbalized understanding.

## 2012-01-05 DIAGNOSIS — R310 Gross hematuria: Secondary | ICD-10-CM

## 2012-01-05 DIAGNOSIS — N410 Acute prostatitis: Secondary | ICD-10-CM

## 2012-01-05 DIAGNOSIS — N201 Calculus of ureter: Secondary | ICD-10-CM

## 2012-01-05 DIAGNOSIS — R339 Retention of urine, unspecified: Secondary | ICD-10-CM

## 2012-01-05 DIAGNOSIS — R300 Dysuria: Secondary | ICD-10-CM

## 2012-01-05 DIAGNOSIS — R109 Unspecified abdominal pain: Secondary | ICD-10-CM

## 2012-01-05 HISTORY — DX: Dysuria: R30.0

## 2012-01-05 HISTORY — DX: Gross hematuria: R31.0

## 2012-01-05 HISTORY — DX: Calculus of ureter: N20.1

## 2012-01-05 HISTORY — DX: Acute prostatitis: N41.0

## 2012-01-05 HISTORY — DX: Retention of urine, unspecified: R33.9

## 2012-01-05 HISTORY — DX: Unspecified abdominal pain: R10.9

## 2012-01-14 ENCOUNTER — Ambulatory Visit (INDEPENDENT_AMBULATORY_CARE_PROVIDER_SITE_OTHER): Payer: Medicare Other | Admitting: Cardiology

## 2012-01-14 ENCOUNTER — Ambulatory Visit (INDEPENDENT_AMBULATORY_CARE_PROVIDER_SITE_OTHER): Payer: Medicare Other | Admitting: Adult Health

## 2012-01-14 ENCOUNTER — Encounter: Payer: Self-pay | Admitting: Cardiology

## 2012-01-14 ENCOUNTER — Encounter: Payer: Self-pay | Admitting: Adult Health

## 2012-01-14 ENCOUNTER — Other Ambulatory Visit (INDEPENDENT_AMBULATORY_CARE_PROVIDER_SITE_OTHER): Payer: Medicare Other

## 2012-01-14 VITALS — BP 124/90 | HR 100 | Temp 97.8°F | Ht 70.0 in | Wt 265.0 lb

## 2012-01-14 VITALS — BP 110/88 | HR 106 | Ht 70.0 in | Wt 262.0 lb

## 2012-01-14 DIAGNOSIS — R0789 Other chest pain: Secondary | ICD-10-CM

## 2012-01-14 DIAGNOSIS — K219 Gastro-esophageal reflux disease without esophagitis: Secondary | ICD-10-CM | POA: Diagnosis not present

## 2012-01-14 DIAGNOSIS — R072 Precordial pain: Secondary | ICD-10-CM | POA: Insufficient documentation

## 2012-01-14 DIAGNOSIS — R079 Chest pain, unspecified: Secondary | ICD-10-CM

## 2012-01-14 LAB — CBC WITH DIFFERENTIAL/PLATELET
Basophils Relative: 0.2 % (ref 0.0–3.0)
Eosinophils Relative: 1.6 % (ref 0.0–5.0)
HCT: 44.5 % (ref 39.0–52.0)
Hemoglobin: 14.4 g/dL (ref 13.0–17.0)
Lymphs Abs: 2.2 10*3/uL (ref 0.7–4.0)
Monocytes Relative: 12.6 % — ABNORMAL HIGH (ref 3.0–12.0)
Neutro Abs: 6.3 10*3/uL (ref 1.4–7.7)
RBC: 5.73 Mil/uL (ref 4.22–5.81)
RDW: 16 % — ABNORMAL HIGH (ref 11.5–14.6)
WBC: 9.8 10*3/uL (ref 4.5–10.5)

## 2012-01-14 LAB — BASIC METABOLIC PANEL
CO2: 28 mEq/L (ref 19–32)
Calcium: 9.7 mg/dL (ref 8.4–10.5)
Creatinine, Ser: 1.3 mg/dL (ref 0.4–1.5)
Glucose, Bld: 132 mg/dL — ABNORMAL HIGH (ref 70–99)

## 2012-01-14 MED ORDER — PANTOPRAZOLE SODIUM 40 MG PO TBEC: 40.0000 mg | DELAYED_RELEASE_TABLET | Freq: Every day | ORAL | Status: DC

## 2012-01-14 NOTE — Assessment & Plan Note (Signed)
Anterior chest wall tightness, with associated epigastric burning. Suspect, is representative of the underlying uncontrolled reflux. However, patient has extensive risk factors with obesity, hypertension, diabetes, hyperlipidemia, and a strong family history. Previous Myoview stress test in 2009 was negative, We'll send to cardiology for evaluation and possible further testing  Plan:  GERD tx  Refer to cards  Aggressive control on HTN, DM and wt loss, w/ lifestyle modifications Decrease caffeine

## 2012-01-14 NOTE — Assessment & Plan Note (Signed)
Flare of gastroesophageal reflux with discontinuation of PPI.  Patient is advised on GERD diet, decreasing caffeine intake  Plan Restart Protonix daily.  Zantac each bedtime GERD diet Hold voltaren if possible Recheck in 6 weeks and as needed Please contact office for sooner follow up if symptoms do not improve or worsen or seek emergency care  Labs w/ cbc and bmet

## 2012-01-14 NOTE — Patient Instructions (Addendum)
You have been referred to Gastroenterology  Your physician has requested that you have an exercise tolerance test. For further information please visit [email or web address] Please also follow instruction sheet, as given.  Exercise Stress Electrocardiography A cardiovascular stress test is done to help determine the presence of coronary artery disease (CAD). A stress test is done while you are walking on a treadmill. The goal of an exercise stress test is to raise your heart rate. This will help determine whether your heart can supply itself with enough blood during increased exercise. People with CAD may experience symptoms such as:  Chest pain.  Shortness of breath.  Arm pain. This usually occurs in the left arm but can occur in the right arm.  Forceful or irregular heart beats (palpitations). This may occur with or without dizziness upon activity.  Unusual sweating at rest or profuse sweating with light activity.  Associated nausea with the above symptoms. INSTRUCTIONS PRIOR TO YOUR STRESS TEST  You should not eat or drink for 4 hours prior to your stress test or as told by your caregiver.  This test involves walking on a treadmill. Wear loose fitting clothes and comfortable shoes for the test.  Arrive 1 hour before the test or as told by your caregiver. PROCEDURE  Many sticky patches (electrodes) will be put on your chest.  If you have a hairy chest, small areas may have to be shaved to make better contact with the electrodes.  Once the electrodes are attached to your body, wires will be attached to the electrodes, then to an electrocardiogram (ECG) machine.  The ECG machine will record your heart rhythm. Specific heart rhythm changes will be noted on the ECG printout.  While you are walking on the treadmill, your heart and blood pressure will be monitored.  The treadmill will be started at a slow pace. The treadmill speed and incline will gradually be increased to raise your  heart rate.  The test can be expected to last between 1 and 2 hours. You will need to be monitored after the test to make sure your heart rate and blood pressure are okay before you go home. THE STRESS TEST MAY BE STOPPED IF:  You develop an abnormal heart rate or a very fast heart rate.  You develop chest pain.  You develop high or low blood pressure.  You develop shortness of breath.  You feel dizzy. WHAT DOES AN ABNORMAL RESULT MEAN AND WHAT HAPPENS NEXT?   An abnormal stress test can indicate significant coronary artery disease (CAD). CAD is defined as narrowing in 1 or more heart (coronary) arteries of more than 70%.  If you have an abnormal stress test, this does not mean your are not getting blood flow to your heart. It only means more testing is needed to understand why your test was abnormal. A common follow up test is a coronary angiogram. OBTAINING THE TEST RESULTS It is your responsibility to obtain your test results. Ask the lab or department performing the test when and how you will get your results. SEEK IMMEDIATE MEDICAL CARE IF:  At any time after the stress test you develop:   Chest pain.  Pain radiating down your left arm.  A feeling of being sick to your stomach (nausea).  Vomiting.  Fainting or shortness of breath. MAKE SURE YOU:   Understand these instructions.  Will watch your condition.  Will get help right away if you are not doing well or get worse. Document Released: 12/19/1999 Document  Revised: 03/15/2011 Document Reviewed: 06/19/2008 [facility] Patient Information 2013 [place], [place].

## 2012-01-14 NOTE — Progress Notes (Signed)
Subjective:    Patient ID: [name], male    DOB: 1945/03/29, 67 y.o.   MRN: [id number]  HPI 67 yo male with multiple medical problems including:  HBP;  VV/ VI/ hx DVT;  Hyperlipidemia;  DM;  Obesity;  GERD/ Divertics/ Colon polyps/ Hems;  Gallstone;  BPH/ Bladder outlet obstruction;  Kidney stones;  DJD/ LBP;  Anxiety...  ~  September 15, 2009:  he's had a busy 52mo- left knee arthroscopy by DrSupple 2 wks ago & kidney stone surg DrPeterson 7/11 w/ cysto, ureteroscopy, laser frag & stent;  then f/u Urology 8/11 for this & his BPH, incr PSA & ED> he says PSA was "OK"...  BP is controlled on Diovan;  FLP looks good on Simva40;  DM reasonable on his Metform+Glimep as he needs to lose weight to affect better control!...  daughter wants him to have Rheum consult;  he wants handicap sticker & 90d refills;  OK 2011 Flu vaccine.  ~  April 07, 2010:  69mo ROV & he is sched for left TKR by DrOlin soon;  Asked to check pt pre-op for hx DVT & assess risk w/ +FamHx> pt had DVT 2007 after back surg & was treated w/ Hep/ Coumadin- discontinued after 1 yr & no recurrence;  He subseq had broken ankle after a fall in 2009 w/ surg DrDuda & no prob in the post op period;  Presented w/ some leg swelling in 2010 & Ven Dopplers were neg for DVT, treated w/ sodium restriction, elevation, support hose;  He had left knee arthroscopy 8/11 by DrSupple followed by 5d Lovenox Rx & no prob w/ that approach;  As far as the [facility] is concerned- Mother is the only family member w/ blood clots (she is still alive at 68- hx DM improved w/ wt reduction, DJD, Gout, VV, and hx DVT in past on Coumadin for yrs);  NOTE> Ven Dopplers done 03/25/10 showed +VV, no DVT;  His risk is elev due to his prev hx but not extreme & we will check Hypercoag Panel for completeness> he should be OK w/ the standard DVT prophylaxis after knee surg...    HBP>  On Diovan 160mg tabs- 1/2 tab daily + low sodium/ wt reducing diet;  BP= 120/70 & similar at home, tol med  well & denies CP/ palpit/ dizzy/ SOB/ incr edema etc...    Chol>  On Simva40 & FLP shows TChol 125, TG 141, HDL 29, LDL 68;  Good control on med but needs better low fat diet & incr exercise...    DM>  On Metform500Bid + Glimep1mg  Qam;  FBS=102 (pre-op labs) & A1c=7.2> pt rec to continue same meds & get on better low carb, wt reducing diet...    Obese>  His weight is 266#, 70" tall, BMI= 38;  We have discussed diet & exercise w/ each office visit but he's had difficulty w/ compliance due to his arthritis & hopefully the TKR will allow for more vigorous exercise program...    GI>  Hx GERD, Divertics, polyps> last colon 2010 by [name] was neg & f/u planned 34yrs;  He takes Prevacid 30mg /d, and Zantac 150mg  Qhs...    GU>  Long hx kidney stones followed by DrPeterson & Dahlstadt;  Pre-op labs showed UTI & he's been started on SeptraDS, NOTE- he is very allergic to Cipro;  Labs showed BUN=19, Creat=1.13  ~  October 05, 2010:  52mo ROV & [name] has been busy again in the interval- s/p left TKR by  DrOlin 4/12 & s/p right TKR 7/12; by all accounts he did beautifully, no complic, went thru rehab & now into exercise program (off Hydrocodone, using Voltaren prn); weight down 9# and BS/A1c improved (on Metformin, Amaryl); he reports passing several small kidney stones as well & is taking Rapaflo from Urology...    HBP>  On Diovan 160mg tabs- 1/2 tab daily + low sodium/ wt reducing diet;  BP= 140/86 & similar at home, tol med well & denies CP/ palpit/ dizzy/ SOB/ incr edema etc...    Chol>  On Simva40 & FLP 4/12 showed TChol 125, TG 141, HDL 29, LDL 68;  Good control on med but needs better low fat diet & incr exercise...    DM>  On Metform500Bid + Glimep1mg  Qam;  FBS=118 & A1c=6.0 now> w/ his wt reduction & incr exerc program- we will rec decr Glimep1mg  to 1/2 tab Qam...    Obese>  His weight is down 9# to 257#, 70" tall, BMI= 36-7;  We have discussed diet & exercise w/ each office visit but he's had difficulty w/  compliance due to his arthritis & hopefully the TKRs will allow for more vigorous exercise program...    GI>  Hx GERD, Divertics, polyps> last colon 2010 by [name] was neg & f/u planned 36yrs;  He takes Prevacid 30mg /d, and Zantac 150mg  Qhs...    GU>  Long hx kidney stones followed by DrPeterson & Dahlstadt;  NOTE- he is very allergic to Cipro;  Labs show BUN=17, Creat=1.0;  [name] placed him on Rapaflo to aide stone passing.    DJD>  As above, s/p bilat TKRs, done w/ rehab & into his exercise program, using Voltaren as needed...  ~  November 23, 2011:  Yearly ROV & [name] had a recent URI- treated w/ ZPak, Mucinex, Hydromet & improving;  otherw reports a good yr, feeling well, no new complaints or concerns- just unable to lose the weight 7 we reviewed DIET, EXERCISE, wt reduction strategies...    HBP>  On Diovan 160mg tabs-1/2 tab daily + low sodium/ wt reducing diet;  BP= 126/78 & similar at home & denies CP/ palpit/ dizzy/ SOB/ incr edema etc...    Chol>  On Simva40 & FLP 11/13 showed TChol 129, TG 162, HDL 32, LDL 64;  Good control on med but needs better low fat diet & incr exercise...    DM>  On Metform500Bid + Glimep1mg -1/2;  FBS=164 & A1c=7.7 now> needs better diet, wt reduction & incr Glimep2mg  Qam...    Obese>  His weight is 259#, 70" tall, BMI= 36-7;  We have discussed diet & exercise w/ each office visit but he's had difficulty w/ compliance due to his arthritis & hopefully the TKRs will allow for more vigorous exercise program...    GI>  Hx GERD, Divertics, polyps> last colon 2010 by [name] was neg & f/u planned 19yrs;  He takes PROTONIX 40mg /d, and Zantac 150mg  Qhs...    GU>  Long hx kidney stones followed by DrPeterson & Dahlstadt;  NOTE- he is very allergic to Cipro;  Labs show BUN=17, Creat=1.0;  [name] placed him on Rapaflo to aide stone passing & he notes this really helps...    DJD, s/p bilat TKRs, LBP>  As above, s/p bilat TKRs, done w/ rehab & into his exercise program,  using Voltaren as needed... We reviewed prob list, meds, xrays and labs> see below for updates >> he had Flu vaccine in Sept; wants refills for 90d supplies...  LABS 11/13:  FLP-  ok on Simva40 but TG=162 needs better low fat diet; Chems- ok x BS=164 A1c=7.7 & sl incr LFTs;  CBC- wnl;  TSH=0.41;  PSA=6.04 & refer to Urol.  01/14/2012 Acute OV  Complains of epigastric burning and chest tightness  x 1 week. Complains of mid anterior chest wall tightness, along with epigastric burning over the last week or so. Pain is not associated with activity. He has been going to the gym 3 days a week with no chest pain while walking or doing treadmill. He denies any associated dyspnea, palpitations, presyncopal, syncopal episodes, or diaphoresis. He has not taken any medications for this. Does have intermittent heartburn, and indigestion symptoms. He is on Zantac at night. He is no longer taking proton excess. His insurance does not cover this. It was too expensive. EKG today with no acute changes noted. He does have an extensive past medical history, with hypertension, diabetes, and hyperlipidemia with low HDL. He has an extensive family history, with father dying at age 57 from an MI and younger brother with previous stents and angioplasty. He denies any hemoptysis, dizziness, vomiting, bloody stools, calf pain, or lower extremity swelling. He does have a history of reflux with previous gastritis on endoscopy. He is on voltaren daily for chronic arthritis               Problem List:  ? of PULMONARY NODULE, LEFT LOWER LOBE (ICD-518.89) - CXR 3/10 showed ?nodular density on lat film & subseq CT Chest showed that this was an osteophyte, & also displayed some basilar fibrosis, 4mm RLL nodule, & 3cm right adrenal adenoma... ~  CXR 4/12 showed boderline heart size, clear lungs, large osteophytes in lower spine appear stable...  HYPERTENSION, BENIGN (ICD-401.1) - on ASA 81mg /d,  DIOVAN 160mg - 1/2 tab/d... ~   NuclearStressTest for surg clearance 1/09 was neg- ? soft tissue atten ant, no ischemia, EF= 57%... ~  EKG in ER 5/10 showed NSR, no ectopy, IVCD, LAD, no acute changes. ~  EKG 4/12 showed NSR, LAD, otherw wnl... ~  11/13:  BP= 126/78 & he remains asymptomatic...  VARICOSE VEINS, LOWER EXTREMITIES (ICD-454.9) VENOUS INSUFFICIENCY (ICD-459.81) Hx of DEEP VEIN THROMBOSIS (ICD-453.8) - he has VV & VI as baseline> hx left leg DVT after neck surgery 2007 treated w/ coumadin; no recurrent prob after left leg fracture w/ surg 11/09 by Tyna Jaksch... ~  4/12:  Pre-op assessment for TKR> neg Ven Dopplers & neg hypercoagulable panel; OK for surg... ~  10/12:  He had left TKR 4/12 & right TKR 7/12; no complic, went thru rehab & now exercising on his own...  HYPERLIPIDEMIA (ICD-272.4) - on SIMVASTATIN 40mg /d... ~  FLP 2/09 on Simva40 showed TChol 109, TG 100, HDL 32, LDL 57... continue med, better diet, get wt down! ~  FLP 2/10 on Simva40 showed TChol 103, TG 69, HDL 34, LDL 56 ~  FLP 3/11 on Simva40 showed TChol 114, TG 105, HDL 38, LDL 55 ~  FLP 9/11 on Simva40 showed TChol 128, TG 134, HDL 30, LDL 72... needs incr exerc & get wt down. ~  FLP 4/12 on Simva40 showed TChol 128, TG 141, HDL 29, LDL 68 ~  FLP 11/13 on Simva40 showed TChol 129, TG 162, HDL 32, LDL 64  DIABETES MELLITUS (ICD-250.00) - on METFORMIN 500mg Bid & GLIMEPIRIDE 1mg /d (added 9/10)... he has been unable to lose weight! ~  labs 7/08 showed BS=117, HgA1c=6.5.[name]. rec> same meds, better diet... ~  labs 2/09 showed BS=120... rec> same med, better  diet and get weight down!! ~  labs 2/10 showed BS= 145, A1c= 7.1 ~  labs 9/10 showed BS= 199, A1c= 7.8.[name]. rec> added GLIMEPIRIDE 1mg /d. ~  labs 3/11 showed BS= 149, A1c= 6.8, weight = 268#, 70" tall, BMI= 38.5 ~  labs 9/11 showed BS= 135, A1c= 7.1 ~  Labs 4/12 showed BS= 102, A1c=7.2 ~  Labs 10/12 showed BS= 118, A1c= 6.0.Marland KitchenMarland Kitchen Post op TKRs, exercising, wt down 9#, REC to decr Glimep1mg - 1/2 tab Qam. ~   Labs 11/13 showed BS= 164, A1c= 7.7.Marland KitchenMarland Kitchen Rec to incr the Glimep to 2mg  Qam...  G E R D (ICD-530.81) - he takes PROTONIX 40mg /d, and RANITADINE 150mg  Qhs...  ~  he had EGD by DrPatterson 1994 showing gastritis ?due to NSAIDs, Rx'd Zantac...  DIVERTICULOSIS OF COLON (ICD-562.10) HEMORRHOIDS (ICD-455.6) COLONIC POLYPS (ICD-211.3) ~  colonoscopy 5/01 by [name] showed sm polyps (hyperplastic), divertics, hems, & ileal inflamm- neg bx...  ~  colonoscopy 7/10 by [name]- divertics, hems, diminutive polyp in transverse colon w/ bx= polypoid mucosa, no adenoma, f/u planned 10y.   CHOLELITHIASIS (ICD-574.20) - gallstone noted on sonar 8/04... no symptoms- denies n/v, abd pain, etc... ~  Labs 11/13 showed SGOT=39 and SGPT=55 compatible w/ Fatty Liver disease & he is asked to GET THE WEIGHT DOWN!!!  BENIGN PROSTATIC HYPERTROPHY, WITH OBSTRUCTION (ICD-600.01) - neg biopsies 10/06 by DrDahlstedt... ~  9/10: pt states sl incr PSA's followed by DrPeterson Q99mo- we don't have their lab data. ~  11/13:  PSA here = 6.04 & he is referred back to DrDahlstedt to consider prostate bx...  NEPHROLITHIASIS (ICD-592.0) - followed by DrPeterson> "I've had them ever since I was 16"... ~  CT Abd 7/09 by Urology showed bilat non-obstructing stones, incidental 2-3cm right adrenal adenoma, DJD Lspine. ~  2/10: states he passed a sm stone on his own... ~  7/11:  s/p cysto, ureteroscopy, laser frag & stent for yet another stone. ~  10/12:  He reports passing several sm stones over the last few mo, on Rapaflo per [name]... ~  11/13:  He tells me that he continues to pass stones on & off...  DEGENERATIVE JOINT DISEASE (ICD-715.90) - c/o bilat knee pain> he saw DrSupple for Ortho w/ shot in right knee 2010 (helped); shot didn't help on left- therefore left knee arthroscopy 8/11... ~  4/12:  S/p left TKR by DrOlin... ~  7/12:  S/p right TKR by DrOlin; he has been thru the PT & doing well, exercising 2d/wk at gym, on diet  etc...  LOW BACK PAIN SYNDROME (ICD-724.2) - prev hx of T1-T2 surgery for HNP.[name]. recent eval by Susann Givens, DrTooke, DrWang... decided against surg and DrWang tried a shot to "burn" the nerve- "I'm 60% better"... still doing exercises and taking Celebrex- we decided to change to Mobic/ Tylenol Rx...  ANXIETY (ICD-300.00)     Past Medical History  Diagnosis Date  . Anxiety   . Low back pain syndrome   . DJD (degenerative joint disease)   . Nephrolithiasis   . BPH (benign prostatic hypertrophy) with urinary obstruction   . Cholelithiasis   . Hemorrhoids   . History of colonic polyps   . Diverticulosis of colon   . GERD (gastroesophageal reflux disease)   . Diabetes mellitus   . Hyperlipidemia   . History of DVT (deep vein thrombosis)   . Venous insufficiency   . Varicose veins of lower extremities   . Hypertension, benign   . pulmonary nodule, left lower lobe  Past Surgical History  Procedure Date  . Sebaceous cyst surgery   . Bilateral inguinal hernia repairs   . T1-t2 disc surgery 2007    by Dr. Otelia Sergeant  . Left ankle surgery with plate and screws 11/2007    by Dr. Lajoyce Corners    Outpatient Encounter Prescriptions as of 01/14/2012  Medication Sig Dispense Refill  . aspirin 81 MG tablet Take 81 mg by mouth daily.        . cetirizine (ZYRTEC) 10 MG tablet Take 10 mg by mouth daily.        . diclofenac (VOLTAREN) 75 MG EC tablet Take 1 tablet (75 mg total) by mouth 2 (two) times daily with a meal.  180 tablet  3  . glimepiride (AMARYL) 2 MG tablet Take 1 tablet (2 mg total) by mouth daily before breakfast.  90 tablet  3  . metFORMIN (GLUCOPHAGE) 500 MG tablet Take 1 tablet (500 mg total) by mouth 2 (two) times daily with a meal.  180 tablet  3  . multivitamin (THERAGRAN) per tablet Take 1 tablet by mouth daily.        . pantoprazole (PROTONIX) 40 MG tablet TAKE 1 TABLET (40 MG TOTAL) BY MOUTH DAILY.  30 tablet  5  . ranitidine (ZANTAC) 150 MG capsule Take 150 mg by mouth at  bedtime.        . silodosin (RAPAFLO) 4 MG CAPS capsule Take 4 mg by mouth daily with breakfast.       . simvastatin (ZOCOR) 40 MG tablet Take 1 tablet (40 mg total) by mouth at bedtime.  90 tablet  3  . valsartan (DIOVAN) 160 MG tablet Take 1/2 tablet by daily  30 tablet  11    Allergies  Allergen Reactions  . Ciprofloxacin     REACTION: rash  . Niacin     REACTION: hypertension, shaky    Current Medications, Allergies, Past Medical History, Past Surgical History, Family History, and Social History were reviewed in [employer] record.    Review of Systems        Constitutional:   No  weight loss, night sweats,  Fevers, chills, fatigue, or  lassitude.  HEENT:   No headaches,  Difficulty swallowing,  Tooth/dental problems, or  Sore throat,                No sneezing, itching, ear ache, nasal congestion, post nasal drip,   CV:  No chest pain,  Orthopnea, PND, swelling in lower extremities, anasarca, dizziness, palpitations, syncope.   GI  No  abdominal pain, nausea, vomiting, diarrhea, change in bowel habits, loss of appetite, bloody stools.   Resp: No shortness of breath with exertion or at rest.  No excess mucus, no productive cough,  No non-productive cough,  No coughing up of blood.  No change in color of mucus.  No wheezing.  No chest wall deformity  Skin: no rash or lesions.  GU: no dysuria, change in color of urine, no urgency or frequency.  No flank pain, no hematuria   MS:  No joint swelling.   [name]    Psych:  No change in mood or affect. No depression or anxiety.  No memory loss.       Objective:   Physical Exam     WD, Obese, 67 y/o WM in NAD... GENERAL:  Alert & oriented; pleasant & cooperative... HEENT:  [place]/AT, EOM-wnl, PERRLA, EACs-clear, TMs-wnl, NOSE-clear, THROAT-clear & wnl. NECK:  Supple w/ fair ROM;  no JVD; normal carotid impulses w/o bruits; no thyromegaly or nodules palpated; no lymphadenopathy. CHEST:  Clear to P & A; without  wheezes/ rales/ or rhonchi. HEART:  Regular Rhythm; without murmurs/ rubs/ or gallops. ABDOMEN:  Obese, soft & nontender; normal bowel sounds; no organomegaly or masses detected. EXT: s/p bilat TKRs, mod arthritic changes & sl decr ROM knees; +varicose veins/ +venous insuffic/ tr edema. NEURO:   motor testing normal; sensory testing normal; gait normal & balance OK. DERM:  no rash etc...   EKG 01/14/2012 >>HR 93, NSR left axis anterior fasicular block , incomplete block, PVCs  Assessment & Plan:

## 2012-01-14 NOTE — Patient Instructions (Addendum)
We are referring you to cardiology for evaluation of chest tightness.  GERD diet  Hold voltaren if possible  Restart Protonix 40mg  daily before meal  Continue on Zantac At bedtime   Decrease Caffeine intake.  Please contact office for sooner follow up if symptoms do not improve or worsen or seek emergency care  follow up Dr. Kriste Basque  In 6-8 weeks and  As needed

## 2012-01-14 NOTE — Addendum Note (Signed)
Addended by: Boone Master E on: 01/14/2012 12:47 PM   Modules accepted: Orders

## 2012-01-14 NOTE — Progress Notes (Signed)
HPI The patient presents for evaluation of chest discomfort. He has had problems with what he thinks has been reflux for years. He's having the same kind of symptoms now however, they have not gone away for one week despite treatment with Zantac. He says it's a burning discomfort. It is dull. It's in his mid chest. It is worse after eating something like BBQ. It is not made worse with activities and he doesn't feel it when he is at the gym. He's not describing associated symptoms such as nausea vomiting or diaphoresis. He's not had any palpitations, presyncope or syncope. His primary provider thought there might have been a change on his EKG however and he was referred here. I did review a previous stress test in 2009 that demonstrated no evidence of ischemia or infarct on perfusion imaging.  Allergies  Allergen Reactions  . Ciprofloxacin     REACTION: rash  . Niacin     REACTION: hypertension, shaky    Current Outpatient Prescriptions  Medication Sig Dispense Refill  . aspirin 81 MG tablet Take 81 mg by mouth daily.        . cetirizine (ZYRTEC) 10 MG tablet Take 10 mg by mouth daily.        . diclofenac (VOLTAREN) 75 MG EC tablet Take 1 tablet (75 mg total) by mouth 2 (two) times daily with a meal.  180 tablet  3  . glimepiride (AMARYL) 2 MG tablet Take 1 tablet (2 mg total) by mouth daily before breakfast.  90 tablet  3  . metFORMIN (GLUCOPHAGE) 500 MG tablet Take 1 tablet (500 mg total) by mouth 2 (two) times daily with a meal.  180 tablet  3  . multivitamin (THERAGRAN) per tablet Take 1 tablet by mouth daily.        . silodosin (RAPAFLO) 4 MG CAPS capsule Take 4 mg by mouth daily with breakfast.       . simvastatin (ZOCOR) 40 MG tablet Take 1 tablet (40 mg total) by mouth at bedtime.  90 tablet  3  . valsartan (DIOVAN) 160 MG tablet Take 1/2 tablet by daily  30 tablet  11  . pantoprazole (PROTONIX) 40 MG tablet Take 1 tablet (40 mg total) by mouth daily.  30 tablet  5  . ranitidine  (ZANTAC) 150 MG capsule Take 150 mg by mouth at bedtime.          Past Medical History  Diagnosis Date  . Anxiety   . Low back pain syndrome   . DJD (degenerative joint disease)   . Nephrolithiasis   . BPH (benign prostatic hypertrophy) with urinary obstruction   . Cholelithiasis   . Hemorrhoids   . History of colonic polyps   . Diverticulosis of colon   . GERD (gastroesophageal reflux disease)   . Diabetes mellitus   . Hyperlipidemia   . History of DVT (deep vein thrombosis)   . Venous insufficiency   . Varicose veins of lower extremities   . Hypertension, benign   . pulmonary nodule, left lower lobe     Past Surgical History  Procedure Date  . Sebaceous cyst surgery   . Bilateral inguinal hernia repairs   . T1-t2 disc surgery 2007    by Dr. Otelia Sergeant  . Left ankle surgery with plate and screws 11/2007    by Dr. Lajoyce Corners  . Total knee arthroplasty     right and left  . Cervical disc arthroplasty   . Tonsillectomy   . Appendectomy  Family History  Problem Relation Age of Onset  . Gout Father   . Coronary artery disease Father 28    died of massive MI age 50  . CAD Brother 42    History   Social History  . Marital Status: Married    Spouse Name: N/A    Number of Children: 2  . Years of Education: N/A   Occupational History  . Not on file.   Social History Main Topics  . Smoking status: Never Smoker   . Smokeless tobacco: Never Used  . Alcohol Use: Yes     Comment: social  use  . Drug Use: No  . Sexually Active: Not on file   Other Topics Concern  . Not on file   Social History Narrative   Retired.    ROS:  Positive for constipation, reflux, urinary frequency, knee and back pain. Otherwise as stated in the HPI and negative for all other systems.  PHYSICAL EXAM BP 110/88  Pulse 106  Ht 5\' 10"  (1.778 m)  Wt 262 lb (118.842 kg)  BMI 37.59 kg/m2  SpO2 98% GENERAL:  Well appearing HEENT:  Pupils equal round and reactive, fundi not visualized,  oral mucosa unremarkable NECK:  No jugular venous distention, waveform within normal limits, carotid upstroke brisk and symmetric, no bruits, no thyromegaly LYMPHATICS:  No cervical, inguinal adenopathy LUNGS:  Clear to auscultation bilaterally BACK:  No CVA tenderness CHEST:  Unremarkable HEART:  PMI not displaced or sustained,S1 and S2 within normal limits, no S3, no S4, no clicks, no rubs, no murmurs ABD:  Flat, positive bowel sounds normal in frequency in pitch, no bruits, no rebound, no guarding, no midline pulsatile mass, no hepatomegaly, no splenomegaly, obese EXT:  2 plus pulses throughout, no edema, no cyanosis no clubbing SKIN:  No rashes no nodules NEURO:  Cranial nerves II through XII grossly intact, motor grossly intact throughout PSYCH:  Cognitively intact, oriented to person place and time   EKG:  Sinus rhythm, left axis deviation, left anterior fascicular block, poor anterior R wave progression, rate 93.01/14/2012     ASSESSMENT AND PLAN  Chest discomfort - This is atypical. His EKG is unchanged from a previous EKG. Given this the pretest probability of obstructive coronary disease is low. I do think that given his risk factors however exercise treadmill testing is indicated. I will bring the patient back for a POET (Plain Old Exercise Test). This will allow me to screen for obstructive coronary disease, risk stratify and very importantly provide a prescription for exercise.  Obesity - The patient understands the need to lose weight with diet and exercise. We have discussed specific strategies for this.

## 2012-01-17 ENCOUNTER — Telehealth: Payer: Self-pay | Admitting: Pulmonary Disease

## 2012-01-17 ENCOUNTER — Telehealth: Payer: Self-pay | Admitting: Internal Medicine

## 2012-01-17 NOTE — Telephone Encounter (Signed)
PT CALLED BACK TO ADD THE FOLLOWING: CALL HIM "BEFORE" CALLING IN RX. Hazel Sams

## 2012-01-17 NOTE — Telephone Encounter (Signed)
Unable to reach [place]. Phone rings with no answer. Will try again later

## 2012-01-17 NOTE — Telephone Encounter (Signed)
Pt's protonix was telephoned to [facility] (did not go thru on 01-14-11 at [facility]) to pharmacist Jonny Ruiz.  TP had spoken with patient who stated that he saw that he is already on protonix at home.  Pt to discuss this with Jonny Ruiz w/ [facility].  Regarding the sooner appt w/ Dr Marina Goodell, spoke with [facility] w/ GI.  She will have Dr Lamar Sprinkles nurse call me regarding appt at my direct extention.  Will hold in my basket.

## 2012-01-17 NOTE — Telephone Encounter (Signed)
[facility] w/ GI called back > appt w/ Doug Sou scheduled for 1.16.14 @ 2:15pm.  LMOM TCB x1.

## 2012-01-17 NOTE — Telephone Encounter (Signed)
Spoke with Shanda Bumps and scheduled patient with Doug Sou, PA on 1/01/10/12 at 2:15/2:30 PM.

## 2012-01-18 NOTE — Telephone Encounter (Signed)
Pt returned call.  Advised of upcoming appt with GI.  Pt okay with this date and time.  Nothing further needed; will sign off.

## 2012-01-20 ENCOUNTER — Ambulatory Visit (INDEPENDENT_AMBULATORY_CARE_PROVIDER_SITE_OTHER): Payer: Medicare Other | Admitting: Gastroenterology

## 2012-01-20 ENCOUNTER — Encounter: Payer: Self-pay | Admitting: Gastroenterology

## 2012-01-20 VITALS — BP 110/72 | HR 76 | Ht 70.0 in | Wt 260.8 lb

## 2012-01-20 DIAGNOSIS — K219 Gastro-esophageal reflux disease without esophagitis: Secondary | ICD-10-CM | POA: Diagnosis not present

## 2012-01-20 DIAGNOSIS — R0789 Other chest pain: Secondary | ICD-10-CM

## 2012-01-20 NOTE — Progress Notes (Signed)
Agree with initial assessment and plans 

## 2012-01-20 NOTE — Progress Notes (Signed)
01/20/2012 [name] [id number] [birth year]   HISTORY OF PRESENT ILLNESS:  Patient presents to our office today at the request of his cardiologist for evaluation of his persistent chest pain.  Patient has a history of long-standing GERD, which is usually controlled by his once daily protonix and zantac at bedtime as needed.  He says that about 1.5 weeks ago he developed pressure/burning type chest pain.  He saw his PCP and cardiologist.  He has undergone some evaluation and cardiology does not think that it is his heart causing this problem and recommended that he see GI (he is still getting a stress test as well, however).  His protonix was increased to BID, but this is still not helping.  The pain is constant and keeps him up/wakes him up at night.  Very uncomfortable.  No painful swallowing.  Food sometimes feels like it gets hung up a bit, but that has been occurring intermittently for a while.  Last EGD was several years ago.  No nausea or vomiting, abdominal pain, or painful swallowing.  No unintentional weight loss (has been working out and watching diet to try to lose weight).   Past Medical History  Diagnosis Date  . Anxiety   . Low back pain syndrome   . DJD (degenerative joint disease)   . Nephrolithiasis   . BPH (benign prostatic hypertrophy) with urinary obstruction   . Cholelithiasis   . Hemorrhoids   . History of colonic polyps   . Diverticulosis of colon   . GERD (gastroesophageal reflux disease)   . Diabetes mellitus   . Hyperlipidemia   . History of DVT (deep vein thrombosis)   . Venous insufficiency   . Varicose veins of lower extremities   . Hypertension, benign   . pulmonary nodule, left lower lobe    Past Surgical History  Procedure Date  . Sebaceous cyst surgery   . Bilateral inguinal hernia repairs   . T1-t2 disc surgery 2007    by Dr. Otelia Sergeant  . Left ankle surgery with plate and screws 11/2007    by Dr. Lajoyce Corners  . Total knee arthroplasty     right and left    . Cervical disc arthroplasty   . Tonsillectomy   . Appendectomy     reports that he has never smoked. He has never used smokeless tobacco. He reports that he does not drink alcohol or use illicit drugs. family history includes CAD (age of onset:51) in his brother; Coronary artery disease (age of onset:50) in his father; and Gout in his father. Allergies  Allergen Reactions  . Ciprofloxacin     REACTION: rash  . Niacin     REACTION: hypertension, shaky      Outpatient Encounter Prescriptions as of 01/20/2012  Medication Sig Dispense Refill  . aspirin 81 MG tablet Take 81 mg by mouth daily.        . cetirizine (ZYRTEC) 10 MG tablet Take 10 mg by mouth daily.        . diclofenac (VOLTAREN) 75 MG EC tablet Take 1 tablet (75 mg total) by mouth 2 (two) times daily with a meal.  180 tablet  3  . glimepiride (AMARYL) 2 MG tablet Take 1 tablet (2 mg total) by mouth daily before breakfast.  90 tablet  3  . metFORMIN (GLUCOPHAGE) 500 MG tablet Take 1 tablet (500 mg total) by mouth 2 (two) times daily with a meal.  180 tablet  3  . multivitamin (THERAGRAN) per tablet Take 1  tablet by mouth daily.        . pantoprazole (PROTONIX) 40 MG tablet Take 40 mg by mouth 2 (two) times daily.      . ranitidine (ZANTAC) 150 MG capsule Take 150 mg by mouth at bedtime.        . silodosin (RAPAFLO) 4 MG CAPS capsule Take 4 mg by mouth daily with breakfast.       . simvastatin (ZOCOR) 40 MG tablet Take 1 tablet (40 mg total) by mouth at bedtime.  90 tablet  3  . valsartan (DIOVAN) 160 MG tablet Take 1/2 tablet by daily  30 tablet  11  . [DISCONTINUED] pantoprazole (PROTONIX) 40 MG tablet Take 1 tablet (40 mg total) by mouth daily.  30 tablet  5     REVIEW OF SYSTEMS  : All other systems reviewed and negative except where noted in the History of Present Illness.   PHYSICAL EXAM: BP 110/72  Pulse 76  Ht 5\' 10"  (1.778 m)  Wt 260 lb 12.8 oz (118.298 kg)  BMI 37.42 kg/m2 General: Well developed white male  in no acute distress Head: Normocephalic and atraumatic Eyes:  sclerae anicteric,conjunctive pink. Ears: Normal auditory acuity Lungs: Clear throughout to auscultation Heart: Regular rate and rhythm Abdomen: Soft, non-distended. No masses or hepatomegaly noted. Normal bowel sounds.  Mild epigastric TTP without R/R/G. Rectal: Deferred. Musculoskeletal: Symmetrical with no gross deformities  Neurological: Alert oriented x 4, grossly nonfocal Psychological:  Alert and cooperative. Normal mood and affect  ASSESSMENT AND PLAN: -Noncardiac chest pain -GERD *Sent here by cardiology since they do not believe that it is cardiac in origin.  Due to the worsening nature of the symptoms despite appropriate reflux therapy, we will schedule for repeat EGD with Dr. Marina Goodell.  He can continue the BID protonix and ranitidine, but will try adding maalox in the interim as well to see if that gives him any relief.

## 2012-01-20 NOTE — Patient Instructions (Addendum)
You can take Maalox for your chest discomfort and reflux symptoms.  You have been scheduled for an endoscopy with propofol. Please follow written instructions given to you at your visit today. If you use inhalers (even only as needed) or a CPAP machine, please bring them with you on the day of your procedure.

## 2012-01-26 ENCOUNTER — Other Ambulatory Visit: Payer: Self-pay | Admitting: Internal Medicine

## 2012-01-26 ENCOUNTER — Encounter: Payer: Self-pay | Admitting: Internal Medicine

## 2012-01-26 ENCOUNTER — Ambulatory Visit (AMBULATORY_SURGERY_CENTER): Payer: Medicare Other | Admitting: Internal Medicine

## 2012-01-26 ENCOUNTER — Encounter: Payer: Medicare Other | Admitting: Internal Medicine

## 2012-01-26 ENCOUNTER — Telehealth: Payer: Self-pay

## 2012-01-26 VITALS — BP 133/86 | HR 90 | Temp 96.7°F | Resp 17 | Ht 70.0 in | Wt 260.0 lb

## 2012-01-26 DIAGNOSIS — K219 Gastro-esophageal reflux disease without esophagitis: Secondary | ICD-10-CM

## 2012-01-26 DIAGNOSIS — R079 Chest pain, unspecified: Secondary | ICD-10-CM

## 2012-01-26 DIAGNOSIS — K8020 Calculus of gallbladder without cholecystitis without obstruction: Secondary | ICD-10-CM

## 2012-01-26 DIAGNOSIS — E119 Type 2 diabetes mellitus without complications: Secondary | ICD-10-CM | POA: Diagnosis not present

## 2012-01-26 DIAGNOSIS — R0789 Other chest pain: Secondary | ICD-10-CM | POA: Diagnosis not present

## 2012-01-26 DIAGNOSIS — I10 Essential (primary) hypertension: Secondary | ICD-10-CM | POA: Diagnosis not present

## 2012-01-26 DIAGNOSIS — R911 Solitary pulmonary nodule: Secondary | ICD-10-CM | POA: Diagnosis not present

## 2012-01-26 DIAGNOSIS — F411 Generalized anxiety disorder: Secondary | ICD-10-CM | POA: Diagnosis not present

## 2012-01-26 LAB — GLUCOSE, CAPILLARY: Glucose-Capillary: 134 mg/dL — ABNORMAL HIGH (ref 70–99)

## 2012-01-26 MED ORDER — SODIUM CHLORIDE 0.9 % IV SOLN: 500.0000 mL | INTRAVENOUS | Status: DC

## 2012-01-26 NOTE — Progress Notes (Signed)
VSS. Pt A&Ox3, Pleased with MAC, Report to April RN. DRM

## 2012-01-26 NOTE — Telephone Encounter (Signed)
Pt scheduled for abdominal ultrasound at [facility] 01/28/12. Pt to arrive at 9:15am for a 9:30am appt. Pt to be NPO after midnight. Pt aware of appt date and time.

## 2012-01-26 NOTE — Op Note (Signed)
[facility] [address] [place], [address]   ENDOSCOPY PROCEDURE REPORT  PATIENT: [name], [name]  MR#: [id number] BIRTHDATE: September 08, 1945 , 66  yrs. old GENDER: Male ENDOSCOPIST: Roxy Cedar, MD REFERRED BY:  Rollene Rotunda, M.D. PROCEDURE DATE:  01/26/2012 PROCEDURE:  EGD, diagnostic ASA CLASS:     Class II INDICATIONS:  Chest pain. MEDICATIONS: MAC sedation, administered by CRNA and propofol (Diprivan) 150mg  IV TOPICAL ANESTHETIC: Cetacaine Spray  DESCRIPTION OF PROCEDURE: After the risks benefits and alternatives of the procedure were thoroughly explained, informed consent was obtained.  The [facility] [id number] endoscope was introduced through the mouth and advanced to the second portion of the duodenum. Without limitations.  The instrument was slowly withdrawn as the mucosa was fully examined.      The upper, middle and distal third of the esophagus were carefully inspected and no abnormalities were noted.  The z-line was well seen at the GEJ.  The endoscope was pushed into the fundus which was normal including a retroflexed view.  The antrum, gastric body, first and second part of the duodenum were unremarkable. Retroflexed views revealed no abnormalities.     The scope was then withdrawn from the patient and the procedure completed.  COMPLICATIONS: There were no complications. ENDOSCOPIC IMPRESSION: 1. Normal EGD 2. No cause for chest pain identified  RECOMMENDATIONS: 1.  Continue Protonix 2.  My office will arrange for you to have an abdominal ultrasound performed " Non-cardiac chest pain, r/o gallstones".  REPEAT EXAM:  eSigned:  Roxy Cedar, MD 01/26/2012 8:52 AM   CC:The Patient, Spencer Mcalpine, MD, and Rollene Rotunda, MD

## 2012-01-26 NOTE — Patient Instructions (Addendum)
YOU HAD AN ENDOSCOPIC PROCEDURE TODAY AT THE [facility]: Refer to the procedure report that was given to you for any specific questions about what was found during the examination.  If the procedure report does not answer your questions, please call your gastroenterologist to clarify.  If you requested that your care partner not be given the details of your procedure findings, then the procedure report has been included in a sealed envelope for you to review at your convenience later.  YOU SHOULD EXPECT: Some feelings of bloating in the abdomen. Passage of more gas than usual.  Walking can help get rid of the air that was put into your GI tract during the procedure and reduce the bloating. If you had a lower endoscopy (such as a colonoscopy or flexible sigmoidoscopy) you may notice spotting of blood in your stool or on the toilet paper. If you underwent a bowel prep for your procedure, then you may not have a normal bowel movement for a few days.  DIET: Your first meal following the procedure should be a light meal and then it is ok to progress to your normal diet.  A half-sandwich or bowl of soup is an example of a good first meal.  Heavy or fried foods are harder to digest and may make you feel nauseous or bloated.  Likewise meals heavy in dairy and vegetables can cause extra gas to form and this can also increase the bloating.  Drink plenty of fluids but you should avoid alcoholic beverages for 24 hours.  ACTIVITY: Your care partner should take you home directly after the procedure.  You should plan to take it easy, moving slowly for the rest of the day.  You can resume normal activity the day after the procedure however you should NOT DRIVE or use heavy machinery for 24 hours (because of the sedation medicines used during the test).    SYMPTOMS TO REPORT IMMEDIATELY: A gastroenterologist can be reached at any hour.  During normal business hours, 8:30 AM to 5:00 PM Monday through Friday,  call [phone].  After hours and on weekends, please call the GI answering service at [phone] who will take a message and have the physician on call contact you.   Following upper endoscopy (EGD)  Vomiting of blood or coffee ground material  New chest pain or pain under the shoulder blades  Painful or persistently difficult swallowing  New shortness of breath  Fever of 100F or higher  Black, tarry-looking stools  FOLLOW UP: If any biopsies were taken you will be contacted by phone or by letter within the next 1-3 weeks.  Call your gastroenterologist if you have not heard about the biopsies in 3 weeks.  Our staff will call the home number listed on your records the next business day following your procedure to check on you and address any questions or concerns that you may have at that time regarding the information given to you following your procedure. This is a courtesy call and so if there is no answer at the home number and we have not heard from you through the emergency physician on call, we will assume that you have returned to your regular daily activities without incident.  Continue Protonix  Office will schedule ultra sound r/o gallstones-if you have not heard from office by Friday 01-28-12 call office    SIGNATURES/CONFIDENTIALITY: You and/or your care partner have signed paperwork which will be entered into your electronic medical record.  These  signatures attest to the fact that that the information above on your After Visit Summary has been reviewed and is understood.  Full responsibility of the confidentiality of this discharge information lies with you and/or your care-partner.

## 2012-01-26 NOTE — Progress Notes (Signed)
Patient did not experience any of the following events: a burn prior to discharge; a fall within the facility; wrong site/side/patient/procedure/implant event; or a hospital transfer or hospital admission upon discharge from the facility. (G8907) Patient did not have preoperative order for IV antibiotic SSI prophylaxis. (G8918)  

## 2012-01-27 ENCOUNTER — Telehealth: Payer: Self-pay | Admitting: *Deleted

## 2012-01-27 NOTE — Telephone Encounter (Signed)
  Follow up Call-  Call back number 01/26/2012  Post procedure Call Back phone  # [phone]  Permission to leave phone message Yes     Patient questions:  Do you have a fever, pain , or abdominal swelling? no Pain Score  0 *  Have you tolerated food without any problems? yes  Have you been able to return to your normal activities? yes  Do you have any questions about your discharge instructions: Diet   no Medications  no Follow up visit  no  Do you have questions or concerns about your Care? no  Actions: * If pain score is 4 or above: No action needed, pain <4.

## 2012-01-28 ENCOUNTER — Ambulatory Visit (HOSPITAL_COMMUNITY)
Admission: RE | Admit: 2012-01-28 | Discharge: 2012-01-28 | Disposition: A | Discharge status: Home / Self Care | Payer: Medicare Other | Source: Ambulatory Visit | Attending: Internal Medicine | Admitting: Internal Medicine

## 2012-01-28 ENCOUNTER — Other Ambulatory Visit: Payer: Self-pay | Admitting: Internal Medicine

## 2012-01-28 DIAGNOSIS — E785 Hyperlipidemia, unspecified: Secondary | ICD-10-CM | POA: Diagnosis not present

## 2012-01-28 DIAGNOSIS — K8020 Calculus of gallbladder without cholecystitis without obstruction: Secondary | ICD-10-CM | POA: Diagnosis not present

## 2012-01-28 DIAGNOSIS — E119 Type 2 diabetes mellitus without complications: Secondary | ICD-10-CM | POA: Insufficient documentation

## 2012-01-28 DIAGNOSIS — R0789 Other chest pain: Secondary | ICD-10-CM | POA: Diagnosis not present

## 2012-01-28 DIAGNOSIS — I10 Essential (primary) hypertension: Secondary | ICD-10-CM | POA: Diagnosis not present

## 2012-01-28 DIAGNOSIS — R1013 Epigastric pain: Secondary | ICD-10-CM | POA: Diagnosis not present

## 2012-02-02 ENCOUNTER — Telehealth: Payer: Self-pay | Admitting: Pulmonary Disease

## 2012-02-02 MED ORDER — PANTOPRAZOLE SODIUM 40 MG PO TBEC: 40.0000 mg | DELAYED_RELEASE_TABLET | Freq: Two times a day (BID) | ORAL | Status: DC

## 2012-02-02 NOTE — Telephone Encounter (Signed)
Pt aware rx has been sent. Nothing further was needed 

## 2012-02-08 ENCOUNTER — Ambulatory Visit: Payer: Medicare Other | Admitting: Internal Medicine

## 2012-02-10 ENCOUNTER — Ambulatory Visit (INDEPENDENT_AMBULATORY_CARE_PROVIDER_SITE_OTHER): Payer: Medicare Other | Admitting: Cardiology

## 2012-02-10 DIAGNOSIS — R079 Chest pain, unspecified: Secondary | ICD-10-CM | POA: Diagnosis not present

## 2012-02-10 NOTE — Procedures (Signed)
Exercise Treadmill Test  Pre-Exercise Testing Evaluation Rhythm: normal sinus  Rate: 102     Test  Exercise Tolerance Test Ordering MD: Angelina Sheriff, MD  Interpreting MD: Angelina Sheriff, MD  Unique Test No: 1  Treadmill:  1  Indication for ETT: chest pain - rule out ischemia  Contraindication to ETT: No   Stress Modality: exercise - treadmill  Cardiac Imaging Performed: non   Protocol: standard Bruce - maximal  Max BP:  149/51  Max MPHR (bpm):  153 85% MPR (bpm):  130  MPHR obtained (bpm):  134 % MPHR obtained:  86  Reached 85% MPHR (min:sec):  5:24 Total Exercise Time (min-sec):6:31  Workload in METS:  7.6 Borg Scale: 15  Reason ETT Terminated:  desired heart rate attained    ST Segment Analysis At Rest: normal ST segments - no evidence of significant ST depression With Exercise: no evidence of significant ST depression  Other Information Arrhythmia:  Yes Angina during ETT:  absent (0) Quality of ETT:  diagnostic  ETT Interpretation:  normal - no evidence of ischemia by ST analysis  Comments: The patient had an poor to moderate exercise tolerance.  There was no chest pain.  There was an appropriate level of dyspnea.  There was a normal BP response.  There were PVCs at peak exercise.   There were no ischemic ST T wave changes and a normal heart rate recovery.  Recommendations: Negative adequate ETT.  No further testing is indicated.  Based on the above I gave the patient a prescription for exercise.

## 2012-02-11 ENCOUNTER — Ambulatory Visit (INDEPENDENT_AMBULATORY_CARE_PROVIDER_SITE_OTHER): Payer: Medicare Other | Admitting: General Surgery

## 2012-02-11 ENCOUNTER — Encounter (INDEPENDENT_AMBULATORY_CARE_PROVIDER_SITE_OTHER): Payer: Self-pay | Admitting: General Surgery

## 2012-02-11 VITALS — BP 142/88 | HR 102 | Temp 97.4°F | Resp 18 | Ht 70.0 in | Wt 259.0 lb

## 2012-02-11 DIAGNOSIS — K8020 Calculus of gallbladder without cholecystitis without obstruction: Secondary | ICD-10-CM | POA: Diagnosis not present

## 2012-02-11 NOTE — Progress Notes (Signed)
Patient ID: [name], male   DOB: 06/12/1945, 66 y.o.   MRN: [id number]  Chief Complaint  Patient presents with  . New Evaluation    eval GB    HPI [name] is a 66 y.o. male.  Referred by Dr Nadel HPI This is a 66-year-old male who has been having epigastric and below his xiphoid chest pain since January 10. On January 10 he thought he was having a heart attack and presented to his primary care physician and was referred to cardiology. This looks to be atypical chest pain and he is not having a myocardial infarction. He recently has undergone a stress test which by his report is also normal. He has also undergone an EGD by Dr. Perry which is normal as well. He has had several other episodes of epigastric and what he describes as possibly substernal pain that has been associated with eating. This is mostly associated with fatty or spicy foods. It is also associated with popcorn. He has increased his protonic 7 that has not helped at all. He recently has undergone an ultrasound that shows he has gallstones. He has some constipation. He does not have any nausea or vomiting associated with this.  He stated he had a history of hernia repair. I've obtained his operation report that he had bilateral inguinal hernias and an epigastric hernia repair. Both groin hernias were repaired with Marlex mesh. The epigastric hernia was repaired with sutures only. Past Medical History  Diagnosis Date  . Anxiety   . Low back pain syndrome   . DJD (degenerative joint disease)   . Nephrolithiasis   . BPH (benign prostatic hypertrophy) with urinary obstruction   . Cholelithiasis   . Hemorrhoids   . History of colonic polyps   . Diverticulosis of colon   . GERD (gastroesophageal reflux disease)   . Diabetes mellitus   . Hyperlipidemia   . History of DVT (deep vein thrombosis)   . Venous insufficiency   . Varicose veins of lower extremities   . Hypertension, benign   . pulmonary nodule, left lower  lobe   . Clotting disorder     Past Surgical History  Procedure Date  . Sebaceous cyst surgery   . Bilateral inguinal hernia repairs   . T1-t2 disc surgery 2007    by Dr. Nitka  . Left ankle surgery with plate and screws 11/2007    by Dr. Duda  . Total knee arthroplasty     right and left  . Cervical disc arthroplasty   . Tonsillectomy   . Appendectomy   . Hernia repair     ventral hernia with mesh 1990s    Family History  Problem Relation Age of Onset  . Gout Father   . Coronary artery disease Father 50    died of massive MI age 69  . Heart attack Father   . CAD Brother 51    Social History History  Substance Use Topics  . Smoking status: Never Smoker   . Smokeless tobacco: Never Used  . Alcohol Use: No     Comment: social  use    Allergies  Allergen Reactions  . Ciprofloxacin     REACTION: rash  . Niacin     REACTION: hypertension, shaky    Current Outpatient Prescriptions  Medication Sig Dispense Refill  . aspirin 81 MG tablet Take 81 mg by mouth daily.        . cetirizine (ZYRTEC) 10 MG tablet Take 10   mg by mouth daily.        . diclofenac (VOLTAREN) 75 MG EC tablet Take 75 mg by mouth 2 (two) times daily.      . glimepiride (AMARYL) 2 MG tablet Take 2 mg by mouth daily.      . metFORMIN (GLUCOPHAGE) 500 MG tablet Take 1 tablet (500 mg total) by mouth 2 (two) times daily with a meal.  180 tablet  3  . multivitamin (THERAGRAN) per tablet Take 1 tablet by mouth daily.        . pantoprazole (PROTONIX) 40 MG tablet Take 1 tablet (40 mg total) by mouth 2 (two) times daily.  60 tablet  5  . ranitidine (ZANTAC) 150 MG capsule Take 150 mg by mouth at bedtime.        . silodosin (RAPAFLO) 4 MG CAPS capsule Take 4 mg by mouth daily with breakfast.       . simvastatin (ZOCOR) 40 MG tablet Take 1 tablet (40 mg total) by mouth at bedtime.  90 tablet  3  . valsartan (DIOVAN) 160 MG tablet Take 1/2 tablet by daily  30 tablet  11    Review of Systems Review of  Systems  Constitutional: Negative for fever, chills and unexpected weight change.  HENT: Negative for hearing loss, congestion, sore throat, trouble swallowing and voice change.   Eyes: Negative for visual disturbance.  Respiratory: Negative for cough and wheezing.   Cardiovascular: Positive for chest pain. Negative for palpitations and leg swelling.  Gastrointestinal: Positive for constipation. Negative for nausea, vomiting, abdominal pain, diarrhea, blood in stool, abdominal distention, anal bleeding and rectal pain.  Genitourinary: Negative for hematuria and difficulty urinating.  Musculoskeletal: Positive for arthralgias.  Skin: Negative for rash and wound.  Neurological: Negative for seizures, syncope, weakness and headaches.  Hematological: Negative for adenopathy. Does not bruise/bleed easily.  Psychiatric/Behavioral: Negative for confusion.    Blood pressure 142/88, pulse 102, temperature 97.4 F (36.3 C), temperature source Temporal, resp. rate 18, height 5' 10" (1.778 m), weight 259 lb (117.482 kg).  Physical Exam Physical Exam  Vitals reviewed. Constitutional: He appears well-developed and well-nourished.  Eyes: No scleral icterus.  Cardiovascular: Normal rate, regular rhythm and normal heart sounds.   Pulmonary/Chest: Effort normal and breath sounds normal. He has no wheezes. He has no rales.  Abdominal: Soft. Normal appearance and bowel sounds are normal. He exhibits no distension. There is tenderness in the epigastric area. No hernia.    Lymphadenopathy:    He has no cervical adenopathy.    Data Reviewed ULTRASOUND ABDOMEN:  Technique: Sonography of upper abdominal structures was performed.  Comparison: CT abdomen 04/03/2010  Gallbladder: Mobile shadowing 1.4 cm diameter calculus within  gallbladder. No gallbladder wall thickening, pericholecystic fluid  or sonographic Murphy's sign.  Common bile duct: Suboptimally visualized due to poor penetration  of the liver  by sound, question 6 mm diameter  Liver: Echogenic, likely fatty infiltration, though this can be  seen with cirrhosis and certain infiltrative disorders. Inadequate  visualization of intrahepatic detail due to poor sound  transmission. No gross mass or nodularity identified though  intrahepatic pathology is not excluded by this exam.  IVC: Inadequately visualized due to increased hepatic echogenicity  and poor transmission of sound  Pancreas: Nonvisualization likely due to bowel gas  Spleen: Grossly normal morphology, 11.4 cm length  Right kidney: 12.9 cm length. Cortical atrophy. Small focus of  upper pole without shadowing, question calculus versus tiny  angiomyolipoma 4 mm diameter. No   gross mass or hydronephrosis  otherwise seen.  Left kidney: 14.0 cm length. Diffuse cortical thinning.  Shadowing calculus mid kidney 8 mm diameter. No gross mass or  hydronephrosis.  Aorta: Inadequately visualized due to obscuration by bowel gas.  Other: No ascites identified.  IMPRESSION:  Cholelithiasis without evidence of acute cholecystitis.  Severely limited exam due to presence of bowel gas as well as  markedly increased hepatic echogenicity with poor transmission of  sound.  Unable to exclude abnormalities of the liver and pancreas as well  as aorta.  Question bilateral renal calculi as above.    Assessment    Likely symptomatic cholelithiasis    Plan    He has had cardiac source ruled out and has normal EGD.  His symptoms certainly sound biliary in nature so we discussed lap chole.  I will ask Dr. Nadel if there is any indication for anything more than preop dose of subq heparin and scds due to his history of a postoperative dvt. Will schedule once we figure that out. I discussed the procedure in detail.  The patient was given educational material.  We discussed the risks and benefits of a laparoscopic cholecystectomy and possible cholangiogram including, but not limited to bleeding,  infection, injury to surrounding structures such as the intestine or liver, bile leak, retained gallstones, need to convert to an open procedure, prolonged diarrhea, blood clots such as  DVT, common bile duct injury, anesthesia risks, and possible need for additional procedures.  The likelihood of improvement in symptoms and return to the patient's normal status is good. We discussed the typical post-operative recovery course.       , 02/11/2012, 1:10 PM    

## 2012-02-16 ENCOUNTER — Telehealth (INDEPENDENT_AMBULATORY_CARE_PROVIDER_SITE_OTHER): Payer: Self-pay

## 2012-02-16 ENCOUNTER — Telehealth: Payer: Self-pay | Admitting: Pulmonary Disease

## 2012-02-16 NOTE — Telephone Encounter (Signed)
LM w/Kathleen checking on the status of a medical clearance recommendation request that i sent thru epic on 02/11/12 to DR Kriste Basque. The pt is wanting to schedule surgery but can't until we hear from Dr Kriste Basque. A message will get sent to the nurse and they will be back in touch with [place].

## 2012-02-16 NOTE — Telephone Encounter (Signed)
Called pt to let him know that I have still not heard from Dr Jodelle Green office about the clearance and that I did leave a message for his nurse to call me. Once I get notified from their office about the clearance I will call him back. The pt understands.

## 2012-02-16 NOTE — Telephone Encounter (Signed)
Letter has been printed and placed on SN cart. Please advise thanks

## 2012-02-16 NOTE — Telephone Encounter (Signed)
Pt called in ref to previous msg can be reached at [phone].Raylene Everts

## 2012-02-21 NOTE — Telephone Encounter (Signed)
Called, spoke with pt.  Pt is calling to check on the status of surgery clearance for cholecystomy.  Pt would like to get this scheduled but Dr. Dwain Sarna will not schedule without clearance first.  Dr. Kriste Basque, pls advise.  Thank you.  Note:  Pt requesting we call him back on his cell phone  [phone].

## 2012-02-22 NOTE — Telephone Encounter (Signed)
Called and spoke with pt and he is aware that the surgical clearance has been faxed to Dr. Kerry Dory office.  Will call them in the morning to make sure the they did receive this packet.

## 2012-02-22 NOTE — Telephone Encounter (Signed)
Pt calling in ref to previous msg says he needs a reply asap can be reached at [phone].[name]

## 2012-02-23 ENCOUNTER — Telehealth (INDEPENDENT_AMBULATORY_CARE_PROVIDER_SITE_OTHER): Payer: Self-pay

## 2012-02-23 ENCOUNTER — Encounter (INDEPENDENT_AMBULATORY_CARE_PROVIDER_SITE_OTHER): Payer: Self-pay

## 2012-02-23 NOTE — Telephone Encounter (Signed)
Called pt back to let him know that Dr Dwain Sarna did speak with Dr Kriste Basque about the recommendations before surgery. I advised pt that Dr Dwain Sarna would do compression device and Heparin SQ in the hospital. The pt understands.

## 2012-02-23 NOTE — Telephone Encounter (Signed)
Called and spoke with [name] at Dr. Kerry Dory office and she did receive all forms needed for the surgery for this pt.  Nothing further is needed.

## 2012-02-24 ENCOUNTER — Encounter (HOSPITAL_COMMUNITY): Payer: Self-pay | Admitting: Pharmacist

## 2012-03-02 ENCOUNTER — Other Ambulatory Visit (INDEPENDENT_AMBULATORY_CARE_PROVIDER_SITE_OTHER): Payer: Self-pay | Admitting: General Surgery

## 2012-03-03 ENCOUNTER — Encounter (HOSPITAL_COMMUNITY): Payer: Self-pay

## 2012-03-03 ENCOUNTER — Encounter (HOSPITAL_COMMUNITY)
Admission: RE | Admit: 2012-03-03 | Discharge: 2012-03-03 | Disposition: A | Discharge status: Home / Self Care | Payer: Medicare Other | Source: Ambulatory Visit | Attending: Anesthesiology | Admitting: Anesthesiology

## 2012-03-03 ENCOUNTER — Encounter (HOSPITAL_COMMUNITY)
Admission: RE | Admit: 2012-03-03 | Discharge: 2012-03-03 | Disposition: A | Discharge status: Home / Self Care | Payer: Medicare Other | Source: Ambulatory Visit | Attending: General Surgery | Admitting: General Surgery

## 2012-03-03 DIAGNOSIS — K649 Unspecified hemorrhoids: Secondary | ICD-10-CM | POA: Diagnosis not present

## 2012-03-03 DIAGNOSIS — I10 Essential (primary) hypertension: Secondary | ICD-10-CM | POA: Diagnosis not present

## 2012-03-03 DIAGNOSIS — Z8711 Personal history of peptic ulcer disease: Secondary | ICD-10-CM | POA: Diagnosis not present

## 2012-03-03 DIAGNOSIS — Z79899 Other long term (current) drug therapy: Secondary | ICD-10-CM | POA: Diagnosis not present

## 2012-03-03 DIAGNOSIS — K219 Gastro-esophageal reflux disease without esophagitis: Secondary | ICD-10-CM | POA: Diagnosis not present

## 2012-03-03 DIAGNOSIS — M1990 Unspecified osteoarthritis, unspecified site: Secondary | ICD-10-CM | POA: Diagnosis not present

## 2012-03-03 DIAGNOSIS — E785 Hyperlipidemia, unspecified: Secondary | ICD-10-CM | POA: Diagnosis not present

## 2012-03-03 DIAGNOSIS — I872 Venous insufficiency (chronic) (peripheral): Secondary | ICD-10-CM | POA: Diagnosis not present

## 2012-03-03 DIAGNOSIS — Z86718 Personal history of other venous thrombosis and embolism: Secondary | ICD-10-CM | POA: Diagnosis not present

## 2012-03-03 DIAGNOSIS — Z7982 Long term (current) use of aspirin: Secondary | ICD-10-CM | POA: Diagnosis not present

## 2012-03-03 DIAGNOSIS — N139 Obstructive and reflux uropathy, unspecified: Secondary | ICD-10-CM | POA: Diagnosis not present

## 2012-03-03 DIAGNOSIS — N401 Enlarged prostate with lower urinary tract symptoms: Secondary | ICD-10-CM | POA: Diagnosis not present

## 2012-03-03 DIAGNOSIS — E119 Type 2 diabetes mellitus without complications: Secondary | ICD-10-CM | POA: Diagnosis not present

## 2012-03-03 DIAGNOSIS — K5900 Constipation, unspecified: Secondary | ICD-10-CM | POA: Diagnosis not present

## 2012-03-03 DIAGNOSIS — K8010 Calculus of gallbladder with chronic cholecystitis without obstruction: Secondary | ICD-10-CM | POA: Diagnosis not present

## 2012-03-03 DIAGNOSIS — F411 Generalized anxiety disorder: Secondary | ICD-10-CM | POA: Diagnosis not present

## 2012-03-03 DIAGNOSIS — Z01818 Encounter for other preprocedural examination: Secondary | ICD-10-CM | POA: Diagnosis not present

## 2012-03-03 HISTORY — DX: Personal history of urinary calculi: Z87.442

## 2012-03-03 LAB — CBC WITH DIFFERENTIAL/PLATELET
Basophils Relative: 0 % (ref 0–1)
HCT: 43.7 % (ref 39.0–52.0)
Hemoglobin: 14.4 g/dL (ref 13.0–17.0)
Lymphocytes Relative: 26 % (ref 12–46)
Lymphs Abs: 2.3 10*3/uL (ref 0.7–4.0)
Monocytes Absolute: 1 10*3/uL (ref 0.1–1.0)
Monocytes Relative: 11 % (ref 3–12)
Neutro Abs: 5.3 10*3/uL (ref 1.7–7.7)
Neutrophils Relative %: 61 % (ref 43–77)
RBC: 5.51 MIL/uL (ref 4.22–5.81)
WBC: 8.7 10*3/uL (ref 4.0–10.5)

## 2012-03-03 LAB — COMPREHENSIVE METABOLIC PANEL
Albumin: 4.1 g/dL (ref 3.5–5.2)
Alkaline Phosphatase: 48 U/L (ref 39–117)
BUN: 18 mg/dL (ref 6–23)
CO2: 26 mEq/L (ref 19–32)
Chloride: 104 mEq/L (ref 96–112)
Creatinine, Ser: 1.14 mg/dL (ref 0.50–1.35)
GFR calc non Af Amer: 65 mL/min — ABNORMAL LOW (ref >90)
Glucose, Bld: 211 mg/dL — ABNORMAL HIGH (ref 70–99)
Potassium: 4.3 mEq/L (ref 3.5–5.1)
Total Bilirubin: 0.4 mg/dL (ref 0.3–1.2)

## 2012-03-03 LAB — SURGICAL PCR SCREEN
MRSA, PCR: NEGATIVE
Staphylococcus aureus: POSITIVE — AB

## 2012-03-03 NOTE — Progress Notes (Signed)
03/03/12 1009  OBSTRUCTIVE SLEEP APNEA  Have you ever been diagnosed with sleep apnea through a sleep study? No  Do you snore loudly (loud enough to be heard through closed doors)?  1  Do you often feel tired, fatigued, or sleepy during the daytime? 1  Has anyone observed you stop breathing during your sleep? 0  Do you have, or are you being treated for high blood pressure? 1  BMI more than 35 kg/m2? 1  Age over 68 years old? 1  Neck circumference greater than 40 cm/18 inches? 1  Gender: 1  Obstructive Sleep Apnea Score 7  Score 4 or greater  Results sent to PCP

## 2012-03-03 NOTE — Pre-Procedure Instructions (Signed)
[name]  03/03/2012   Your procedure is scheduled on:  Tuesday, March 4th  Report to [facility] at 0800 AM.  Call this number if you have problems the morning of surgery: [phone]   Remember:   Do not eat food or drink liquids after midnight.    Take these medicines the morning of surgery with A SIP OF WATER: zyrtec, protonix, zantac   Do not wear jewelry, make-up or nail polish.  Do not wear lotions, powders, or perfumes,deodorant.  Do not shave 48 hours prior to surgery. Men may shave face and neck.  Do not bring valuables to the hospital.  Contacts, dentures or bridgework may not be worn into surgery.  Leave suitcase in the car. After surgery it may be brought to your room.  For patients admitted to the hospital, checkout time is 11:00 AM the day of discharge.   Patients discharged the day of surgery will not be allowed to drive home.   Special Instructions: Shower using CHG 2 nights before surgery and the night before surgery.  If you shower the day of surgery use CHG.  Use special wash - you have one bottle of CHG for all showers.  You should use approximately 1/3 of the bottle for each shower.   Please read over the following fact sheets that you were given: Pain Booklet, Coughing and Deep Breathing, MRSA Information and Surgical Site Infection Prevention

## 2012-03-03 NOTE — Progress Notes (Signed)
Primary Physician - Dr. Lennon Alstrom Cardiologist - Dr. Antoine Poche EKG, Stress Test - in epic

## 2012-03-06 MED ORDER — CEFOXITIN SODIUM 2 G IV SOLR
2.0000 g | INTRAVENOUS | Status: AC
  Administered 2012-03-07: 2 g via INTRAVENOUS
  Filled 2012-03-06: qty 2

## 2012-03-06 MED ORDER — HEPARIN SODIUM (PORCINE) 5000 UNIT/ML IJ SOLN
5000.0000 [IU] | Freq: Once | INTRAMUSCULAR | Status: AC
  Administered 2012-03-07: 5000 [IU] via SUBCUTANEOUS
  Filled 2012-03-06: qty 1

## 2012-03-07 ENCOUNTER — Encounter (HOSPITAL_COMMUNITY)
Admission: RE | Disposition: A | Discharge status: Home / Self Care | Payer: Self-pay | Source: Ambulatory Visit | Attending: General Surgery

## 2012-03-07 ENCOUNTER — Ambulatory Visit (HOSPITAL_COMMUNITY): Payer: Medicare Other | Admitting: Anesthesiology

## 2012-03-07 ENCOUNTER — Observation Stay (HOSPITAL_COMMUNITY)
Admission: RE | Admit: 2012-03-07 | Discharge: 2012-03-08 | Disposition: A | Discharge status: Home / Self Care | Payer: Medicare Other | Source: Ambulatory Visit | Attending: General Surgery | Admitting: General Surgery

## 2012-03-07 ENCOUNTER — Encounter (HOSPITAL_COMMUNITY): Payer: Self-pay | Admitting: *Deleted

## 2012-03-07 ENCOUNTER — Encounter (HOSPITAL_COMMUNITY): Payer: Self-pay | Admitting: Anesthesiology

## 2012-03-07 DIAGNOSIS — I872 Venous insufficiency (chronic) (peripheral): Secondary | ICD-10-CM | POA: Insufficient documentation

## 2012-03-07 DIAGNOSIS — N139 Obstructive and reflux uropathy, unspecified: Secondary | ICD-10-CM | POA: Insufficient documentation

## 2012-03-07 DIAGNOSIS — E119 Type 2 diabetes mellitus without complications: Secondary | ICD-10-CM | POA: Insufficient documentation

## 2012-03-07 DIAGNOSIS — I10 Essential (primary) hypertension: Secondary | ICD-10-CM | POA: Insufficient documentation

## 2012-03-07 DIAGNOSIS — K219 Gastro-esophageal reflux disease without esophagitis: Secondary | ICD-10-CM | POA: Insufficient documentation

## 2012-03-07 DIAGNOSIS — K5900 Constipation, unspecified: Secondary | ICD-10-CM | POA: Diagnosis not present

## 2012-03-07 DIAGNOSIS — M1990 Unspecified osteoarthritis, unspecified site: Secondary | ICD-10-CM | POA: Diagnosis not present

## 2012-03-07 DIAGNOSIS — K8020 Calculus of gallbladder without cholecystitis without obstruction: Secondary | ICD-10-CM | POA: Diagnosis not present

## 2012-03-07 DIAGNOSIS — K8010 Calculus of gallbladder with chronic cholecystitis without obstruction: Secondary | ICD-10-CM

## 2012-03-07 DIAGNOSIS — Z8711 Personal history of peptic ulcer disease: Secondary | ICD-10-CM | POA: Insufficient documentation

## 2012-03-07 DIAGNOSIS — F411 Generalized anxiety disorder: Secondary | ICD-10-CM | POA: Diagnosis not present

## 2012-03-07 DIAGNOSIS — E785 Hyperlipidemia, unspecified: Secondary | ICD-10-CM | POA: Insufficient documentation

## 2012-03-07 DIAGNOSIS — Z79899 Other long term (current) drug therapy: Secondary | ICD-10-CM | POA: Insufficient documentation

## 2012-03-07 DIAGNOSIS — K5730 Diverticulosis of large intestine without perforation or abscess without bleeding: Secondary | ICD-10-CM | POA: Diagnosis not present

## 2012-03-07 DIAGNOSIS — N401 Enlarged prostate with lower urinary tract symptoms: Secondary | ICD-10-CM | POA: Insufficient documentation

## 2012-03-07 DIAGNOSIS — Z7982 Long term (current) use of aspirin: Secondary | ICD-10-CM | POA: Insufficient documentation

## 2012-03-07 DIAGNOSIS — Z86718 Personal history of other venous thrombosis and embolism: Secondary | ICD-10-CM | POA: Insufficient documentation

## 2012-03-07 DIAGNOSIS — K649 Unspecified hemorrhoids: Secondary | ICD-10-CM | POA: Insufficient documentation

## 2012-03-07 DIAGNOSIS — N138 Other obstructive and reflux uropathy: Secondary | ICD-10-CM | POA: Insufficient documentation

## 2012-03-07 HISTORY — PX: CHOLECYSTECTOMY: SHX55

## 2012-03-07 LAB — CBC
Hemoglobin: 13.3 g/dL (ref 13.0–17.0)
MCH: 26.1 pg (ref 26.0–34.0)
MCHC: 32.6 g/dL (ref 30.0–36.0)

## 2012-03-07 LAB — GLUCOSE, CAPILLARY: Glucose-Capillary: 123 mg/dL — ABNORMAL HIGH (ref 70–99)

## 2012-03-07 SURGERY — LAPAROSCOPIC CHOLECYSTECTOMY
Anesthesia: General | Site: Abdomen | Wound class: Clean Contaminated

## 2012-03-07 MED ORDER — ASPIRIN EC 81 MG PO TBEC
81.0000 mg | DELAYED_RELEASE_TABLET | Freq: Every day | ORAL | Status: DC
  Administered 2012-03-07 – 2012-03-08 (×2): 81 mg via ORAL
  Filled 2012-03-07 (×2): qty 1

## 2012-03-07 MED ORDER — SIMVASTATIN 40 MG PO TABS
40.0000 mg | ORAL_TABLET | Freq: Every day | ORAL | Status: DC
  Administered 2012-03-07: 40 mg via ORAL
  Filled 2012-03-07 (×2): qty 1

## 2012-03-07 MED ORDER — PANTOPRAZOLE SODIUM 40 MG PO TBEC
40.0000 mg | DELAYED_RELEASE_TABLET | Freq: Two times a day (BID) | ORAL | Status: DC
  Administered 2012-03-07 – 2012-03-08 (×3): 40 mg via ORAL
  Filled 2012-03-07 (×3): qty 1

## 2012-03-07 MED ORDER — FENTANYL CITRATE 0.05 MG/ML IJ SOLN: 25.0000 ug | INTRAMUSCULAR | Status: DC | PRN

## 2012-03-07 MED ORDER — ONDANSETRON HCL 4 MG/2ML IJ SOLN
INTRAMUSCULAR | Status: DC | PRN
  Administered 2012-03-07: 4 mg via INTRAVENOUS

## 2012-03-07 MED ORDER — HEMOSTATIC AGENTS (NO CHARGE) OPTIME
TOPICAL | Status: DC | PRN
  Administered 2012-03-07: 1

## 2012-03-07 MED ORDER — ROCURONIUM BROMIDE 100 MG/10ML IV SOLN
INTRAVENOUS | Status: DC | PRN
  Administered 2012-03-07: 50 mg via INTRAVENOUS

## 2012-03-07 MED ORDER — PROPOFOL 10 MG/ML IV BOLUS
INTRAVENOUS | Status: DC | PRN
  Administered 2012-03-07: 200 mg via INTRAVENOUS

## 2012-03-07 MED ORDER — EPHEDRINE SULFATE 50 MG/ML IJ SOLN
INTRAMUSCULAR | Status: DC | PRN
  Administered 2012-03-07: 10 mg via INTRAVENOUS

## 2012-03-07 MED ORDER — PROMETHAZINE HCL 25 MG/ML IJ SOLN: 6.2500 mg | INTRAMUSCULAR | Status: DC | PRN

## 2012-03-07 MED ORDER — INSULIN ASPART 100 UNIT/ML ~~LOC~~ SOLN
0.0000 [IU] | Freq: Three times a day (TID) | SUBCUTANEOUS | Status: DC
  Administered 2012-03-07: 3 [IU] via SUBCUTANEOUS
  Administered 2012-03-08: 2 [IU] via SUBCUTANEOUS

## 2012-03-07 MED ORDER — SODIUM CHLORIDE 0.9 % IR SOLN
Status: DC | PRN
  Administered 2012-03-07: 1000 mL

## 2012-03-07 MED ORDER — SODIUM CHLORIDE 0.9 % IV SOLN
INTRAVENOUS | Status: DC | PRN
  Administered 2012-03-07: 10:00:00

## 2012-03-07 MED ORDER — ONDANSETRON HCL 4 MG/2ML IJ SOLN: 4.0000 mg | Freq: Four times a day (QID) | INTRAMUSCULAR | Status: DC | PRN

## 2012-03-07 MED ORDER — SODIUM CHLORIDE 0.9 % IV SOLN
INTRAVENOUS | Status: DC
  Administered 2012-03-07: 12:00:00 via INTRAVENOUS

## 2012-03-07 MED ORDER — MEPERIDINE HCL 25 MG/ML IJ SOLN: 6.2500 mg | INTRAMUSCULAR | Status: DC | PRN

## 2012-03-07 MED ORDER — BUPIVACAINE HCL (PF) 0.25 % IJ SOLN
INTRAMUSCULAR | Status: AC
  Filled 2012-03-07: qty 30

## 2012-03-07 MED ORDER — HEPARIN SODIUM (PORCINE) 5000 UNIT/ML IJ SOLN
5000.0000 [IU] | Freq: Three times a day (TID) | INTRAMUSCULAR | Status: DC
  Administered 2012-03-07 – 2012-03-08 (×3): 5000 [IU] via SUBCUTANEOUS
  Filled 2012-03-07 (×6): qty 1

## 2012-03-07 MED ORDER — NEOSTIGMINE METHYLSULFATE 1 MG/ML IJ SOLN
INTRAMUSCULAR | Status: DC | PRN
  Administered 2012-03-07: 1 mg via INTRAVENOUS
  Administered 2012-03-07: 4 mg via INTRAVENOUS

## 2012-03-07 MED ORDER — GLYCOPYRROLATE 0.2 MG/ML IJ SOLN
INTRAMUSCULAR | Status: DC | PRN
  Administered 2012-03-07: 0.6 mg via INTRAVENOUS

## 2012-03-07 MED ORDER — TAMSULOSIN HCL 0.4 MG PO CAPS
0.4000 mg | ORAL_CAPSULE | Freq: Every day | ORAL | Status: DC
  Administered 2012-03-07 – 2012-03-08 (×2): 0.4 mg via ORAL
  Filled 2012-03-07 (×3): qty 1

## 2012-03-07 MED ORDER — LIDOCAINE HCL (CARDIAC) 20 MG/ML IV SOLN
INTRAVENOUS | Status: DC | PRN
  Administered 2012-03-07: 100 mg via INTRAVENOUS

## 2012-03-07 MED ORDER — MIDAZOLAM HCL 2 MG/2ML IJ SOLN: 0.5000 mg | Freq: Once | INTRAMUSCULAR | Status: DC | PRN

## 2012-03-07 MED ORDER — ACETAMINOPHEN 325 MG PO TABS: 650.0000 mg | ORAL_TABLET | Freq: Four times a day (QID) | ORAL | Status: DC | PRN

## 2012-03-07 MED ORDER — BUPIVACAINE-EPINEPHRINE 0.25% -1:200000 IJ SOLN
INTRAMUSCULAR | Status: DC | PRN
  Administered 2012-03-07: 50 mL

## 2012-03-07 MED ORDER — PHENYLEPHRINE HCL 10 MG/ML IJ SOLN
INTRAMUSCULAR | Status: DC | PRN
  Administered 2012-03-07: 80 ug via INTRAVENOUS

## 2012-03-07 MED ORDER — OXYCODONE HCL 5 MG PO TABS
5.0000 mg | ORAL_TABLET | ORAL | Status: DC | PRN
  Administered 2012-03-07: 5 mg via ORAL
  Filled 2012-03-07 (×2): qty 1

## 2012-03-07 MED ORDER — FENTANYL CITRATE 0.05 MG/ML IJ SOLN
INTRAMUSCULAR | Status: DC | PRN
  Administered 2012-03-07: 50 ug via INTRAVENOUS
  Administered 2012-03-07: 100 ug via INTRAVENOUS

## 2012-03-07 MED ORDER — ACETAMINOPHEN 650 MG RE SUPP: 650.0000 mg | Freq: Four times a day (QID) | RECTAL | Status: DC | PRN

## 2012-03-07 MED ORDER — MIDAZOLAM HCL 5 MG/5ML IJ SOLN
INTRAMUSCULAR | Status: DC | PRN
  Administered 2012-03-07: 2 mg via INTRAVENOUS

## 2012-03-07 MED ORDER — MORPHINE SULFATE 2 MG/ML IJ SOLN: 2.0000 mg | INTRAMUSCULAR | Status: DC | PRN

## 2012-03-07 MED ORDER — GLIMEPIRIDE 1 MG PO TABS
1.0000 mg | ORAL_TABLET | Freq: Two times a day (BID) | ORAL | Status: DC
  Administered 2012-03-07 – 2012-03-08 (×2): 1 mg via ORAL
  Filled 2012-03-07 (×4): qty 1

## 2012-03-07 MED ORDER — LACTATED RINGERS IV SOLN
INTRAVENOUS | Status: DC
  Administered 2012-03-07 (×2): via INTRAVENOUS

## 2012-03-07 SURGICAL SUPPLY — 43 items
ADH SKN CLS APL DERMABOND .7 (GAUZE/BANDAGES/DRESSINGS) ×2
APPLIER CLIP 5 13 M/L LIGAMAX5 (MISCELLANEOUS) ×3
APR CLP MED LRG 5 ANG JAW (MISCELLANEOUS) ×2
BAG SPEC RTRVL LRG 6X4 10 (ENDOMECHANICALS) ×2
BLADE SURG ROTATE 9660 (MISCELLANEOUS) ×2 IMPLANT
CANISTER SUCTION 2500CC (MISCELLANEOUS) ×3 IMPLANT
CHLORAPREP W/TINT 26ML (MISCELLANEOUS) ×3 IMPLANT
CLIP APPLIE 5 13 M/L LIGAMAX5 (MISCELLANEOUS) ×2 IMPLANT
CLOTH BEACON ORANGE TIMEOUT ST (SAFETY) ×3 IMPLANT
COVER MAYO STAND STRL (DRAPES) IMPLANT
COVER SURGICAL LIGHT HANDLE (MISCELLANEOUS) ×3 IMPLANT
DECANTER SPIKE VIAL GLASS SM (MISCELLANEOUS) ×3 IMPLANT
DERMABOND ADVANCED (GAUZE/BANDAGES/DRESSINGS) ×1
DERMABOND ADVANCED .7 DNX12 (GAUZE/BANDAGES/DRESSINGS) ×2 IMPLANT
DRAPE C-ARM 42X72 X-RAY (DRAPES) IMPLANT
ELECT REM PT RETURN 9FT ADLT (ELECTROSURGICAL) ×3
ELECTRODE REM PT RTRN 9FT ADLT (ELECTROSURGICAL) ×2 IMPLANT
GLOVE BIO SURGEON STRL SZ7 (GLOVE) ×5 IMPLANT
GLOVE BIOGEL PI IND STRL 7.0 (GLOVE) ×1 IMPLANT
GLOVE BIOGEL PI IND STRL 7.5 (GLOVE) ×2 IMPLANT
GLOVE BIOGEL PI INDICATOR 7.0 (GLOVE) ×1
GLOVE BIOGEL PI INDICATOR 7.5 (GLOVE) ×1
GLOVE SURG SIGNA 7.5 PF LTX (GLOVE) ×2 IMPLANT
GOWN STRL NON-REIN LRG LVL3 (GOWN DISPOSABLE) ×10 IMPLANT
GOWN STRL REIN XL XLG (GOWN DISPOSABLE) ×2 IMPLANT
HEMOSTAT SNOW SURGICEL 2X4 (HEMOSTASIS) ×2 IMPLANT
KIT BASIN OR (CUSTOM PROCEDURE TRAY) ×3 IMPLANT
KIT ROOM TURNOVER OR (KITS) ×3 IMPLANT
NS IRRIG 1000ML POUR BTL (IV SOLUTION) ×3 IMPLANT
PAD ARMBOARD 7.5X6 YLW CONV (MISCELLANEOUS) ×3 IMPLANT
POUCH SPECIMEN RETRIEVAL 10MM (ENDOMECHANICALS) ×3 IMPLANT
SCISSORS LAP 5X35 DISP (ENDOMECHANICALS) ×2 IMPLANT
SET CHOLANGIOGRAPH 5 50 .035 (SET/KITS/TRAYS/PACK) IMPLANT
SET IRRIG TUBING LAPAROSCOPIC (IRRIGATION / IRRIGATOR) ×3 IMPLANT
SLEEVE ENDOPATH XCEL 5M (ENDOMECHANICALS) ×6 IMPLANT
SPECIMEN JAR SMALL (MISCELLANEOUS) ×3 IMPLANT
SUT MNCRL AB 4-0 PS2 18 (SUTURE) ×3 IMPLANT
SUT VICRYL 0 UR6 27IN ABS (SUTURE) ×2 IMPLANT
TOWEL OR 17X24 6PK STRL BLUE (TOWEL DISPOSABLE) ×3 IMPLANT
TOWEL OR 17X26 10 PK STRL BLUE (TOWEL DISPOSABLE) ×3 IMPLANT
TRAY LAPAROSCOPIC (CUSTOM PROCEDURE TRAY) ×3 IMPLANT
TROCAR XCEL BLUNT TIP 100MML (ENDOMECHANICALS) ×3 IMPLANT
TROCAR XCEL NON-BLD 5MMX100MML (ENDOMECHANICALS) ×3 IMPLANT

## 2012-03-07 NOTE — Transfer of Care (Signed)
Immediate Anesthesia Transfer of Care Note  Patient: [name]  Procedure(s) Performed: Procedure(s): LAPAROSCOPIC CHOLECYSTECTOMY (N/A)  Patient Location: PACU  Anesthesia Type:General  Level of Consciousness: awake, alert  and oriented  Airway & Oxygen Therapy: Patient Spontanous Breathing and Patient connected to face mask oxygen  Post-op Assessment: Report given to PACU RN, Post -op Vital signs reviewed and stable and Patient moving all extremities X 4  Post vital signs: Reviewed and stable  Complications: No apparent anesthesia complications

## 2012-03-07 NOTE — H&P (View-Only) (Signed)
Patient ID: [name], male   DOB: 03/18/1945, 67 y.o.   MRN: [id number]  Chief Complaint  Patient presents with  . New Evaluation    eval GB    HPI [name] is a 67 y.o. male.  Referred by Dr Kriste Basque HPI This is a 67 year old male who has been having epigastric and below his xiphoid chest pain since January 10. On January 10 he thought he was having a heart attack and presented to his primary care physician and was referred to cardiology. This looks to be atypical chest pain and he is not having a myocardial infarction. He recently has undergone a stress test which by his report is also normal. He has also undergone an EGD by Dr. Marina Goodell which is normal as well. He has had several other episodes of epigastric and what he describes as possibly substernal pain that has been associated with eating. This is mostly associated with fatty or spicy foods. It is also associated with popcorn. He has increased his protonic 7 that has not helped at all. He recently has undergone an ultrasound that shows he has gallstones. He has some constipation. He does not have any nausea or vomiting associated with this.  He stated he had a history of hernia repair. I've obtained his operation report that he had bilateral inguinal hernias and an epigastric hernia repair. Both groin hernias were repaired with Marlex mesh. The epigastric hernia was repaired with sutures only. Past Medical History  Diagnosis Date  . Anxiety   . Low back pain syndrome   . DJD (degenerative joint disease)   . Nephrolithiasis   . BPH (benign prostatic hypertrophy) with urinary obstruction   . Cholelithiasis   . Hemorrhoids   . History of colonic polyps   . Diverticulosis of colon   . GERD (gastroesophageal reflux disease)   . Diabetes mellitus   . Hyperlipidemia   . History of DVT (deep vein thrombosis)   . Venous insufficiency   . Varicose veins of lower extremities   . Hypertension, benign   . pulmonary nodule, left lower  lobe   . Clotting disorder     Past Surgical History  Procedure Date  . Sebaceous cyst surgery   . Bilateral inguinal hernia repairs   . T1-t2 disc surgery 2007    by Dr. Otelia Sergeant  . Left ankle surgery with plate and screws 11/2007    by Dr. Lajoyce Corners  . Total knee arthroplasty     right and left  . Cervical disc arthroplasty   . Tonsillectomy   . Appendectomy   . Hernia repair     ventral hernia with mesh 1990s    Family History  Problem Relation Age of Onset  . Gout Father   . Coronary artery disease Father 41    died of massive MI age 63  . Heart attack Father   . CAD Brother 34    Social History History  Substance Use Topics  . Smoking status: Never Smoker   . Smokeless tobacco: Never Used  . Alcohol Use: No     Comment: social  use    Allergies  Allergen Reactions  . Ciprofloxacin     REACTION: rash  . Niacin     REACTION: hypertension, shaky    Current Outpatient Prescriptions  Medication Sig Dispense Refill  . aspirin 81 MG tablet Take 81 mg by mouth daily.        . cetirizine (ZYRTEC) 10 MG tablet Take 10  mg by mouth daily.        . diclofenac (VOLTAREN) 75 MG EC tablet Take 75 mg by mouth 2 (two) times daily.      [name] glimepiride (AMARYL) 2 MG tablet Take 2 mg by mouth daily.      . metFORMIN (GLUCOPHAGE) 500 MG tablet Take 1 tablet (500 mg total) by mouth 2 (two) times daily with a meal.  180 tablet  3  . multivitamin (THERAGRAN) per tablet Take 1 tablet by mouth daily.        . pantoprazole (PROTONIX) 40 MG tablet Take 1 tablet (40 mg total) by mouth 2 (two) times daily.  60 tablet  5  . ranitidine (ZANTAC) 150 MG capsule Take 150 mg by mouth at bedtime.        . silodosin (RAPAFLO) 4 MG CAPS capsule Take 4 mg by mouth daily with breakfast.       . simvastatin (ZOCOR) 40 MG tablet Take 1 tablet (40 mg total) by mouth at bedtime.  90 tablet  3  . valsartan (DIOVAN) 160 MG tablet Take 1/2 tablet by daily  30 tablet  11    Review of Systems Review of  Systems  Constitutional: Negative for fever, chills and unexpected weight change.  HENT: Negative for hearing loss, congestion, sore throat, trouble swallowing and voice change.   Eyes: Negative for visual disturbance.  Respiratory: Negative for cough and wheezing.   Cardiovascular: Positive for chest pain. Negative for palpitations and leg swelling.  Gastrointestinal: Positive for constipation. Negative for nausea, vomiting, abdominal pain, diarrhea, blood in stool, abdominal distention, anal bleeding and rectal pain.  Genitourinary: Negative for hematuria and difficulty urinating.  Musculoskeletal: Positive for arthralgias.  Skin: Negative for rash and wound.  Neurological: Negative for seizures, syncope, weakness and headaches.  Hematological: Negative for adenopathy. Does not bruise/bleed easily.  Psychiatric/Behavioral: Negative for confusion.    Blood pressure 142/88, pulse 102, temperature 97.4 F (36.3 C), temperature source Temporal, resp. rate 18, height 5\' 10"  (1.778 m), weight 259 lb (117.482 kg).  Physical Exam Physical Exam  Vitals reviewed. Constitutional: He appears well-developed and well-nourished.  Eyes: No scleral icterus.  Cardiovascular: Normal rate, regular rhythm and normal heart sounds.   Pulmonary/Chest: Effort normal and breath sounds normal. He has no wheezes. He has no rales.  Abdominal: Soft. Normal appearance and bowel sounds are normal. He exhibits no distension. There is tenderness in the epigastric area. No hernia.    Lymphadenopathy:    He has no cervical adenopathy.    Data Reviewed ULTRASOUND ABDOMEN:  Technique: Sonography of upper abdominal structures was performed.  Comparison: CT abdomen 04/03/2010  Gallbladder: Mobile shadowing 1.4 cm diameter calculus within  gallbladder. No gallbladder wall thickening, pericholecystic fluid  or sonographic Murphy's sign.  Common bile duct: Suboptimally visualized due to poor penetration  of the liver  by sound, question 6 mm diameter  Liver: Echogenic, likely fatty infiltration, though this can be  seen with cirrhosis and certain infiltrative disorders. Inadequate  visualization of intrahepatic detail due to poor sound  transmission. No gross mass or nodularity identified though  intrahepatic pathology is not excluded by this exam.  IVC: Inadequately visualized due to increased hepatic echogenicity  and poor transmission of sound  Pancreas: Nonvisualization likely due to bowel gas  Spleen: Grossly normal morphology, 11.4 cm length  Right kidney: 12.9 cm length. Cortical atrophy. Small focus of  upper pole without shadowing, question calculus versus tiny  angiomyolipoma 4 mm diameter. No  gross mass or hydronephrosis  otherwise seen.  Left kidney: 14.0 cm length. Diffuse cortical thinning.  Shadowing calculus mid kidney 8 mm diameter. No gross mass or  hydronephrosis.  Aorta: Inadequately visualized due to obscuration by bowel gas.  Other: No ascites identified.  IMPRESSION:  Cholelithiasis without evidence of acute cholecystitis.  Severely limited exam due to presence of bowel gas as well as  markedly increased hepatic echogenicity with poor transmission of  sound.  Unable to exclude abnormalities of the liver and pancreas as well  as aorta.  Question bilateral renal calculi as above.    Assessment    Likely symptomatic cholelithiasis    Plan    He has had cardiac source ruled out and has normal EGD.  His symptoms certainly sound biliary in nature so we discussed lap chole.  I will ask Dr. Kriste Basque if there is any indication for anything more than preop dose of subq heparin and scds due to his history of a postoperative dvt. Will schedule once we figure that out. I discussed the procedure in detail.  The patient was given Agricultural engineer.  We discussed the risks and benefits of a laparoscopic cholecystectomy and possible cholangiogram including, but not limited to bleeding,  infection, injury to surrounding structures such as the intestine or liver, bile leak, retained gallstones, need to convert to an open procedure, prolonged diarrhea, blood clots such as  DVT, common bile duct injury, anesthesia risks, and possible need for additional procedures.  The likelihood of improvement in symptoms and return to the patient's normal status is good. We discussed the typical post-operative recovery course.       , 02/11/2012, 1:10 PM

## 2012-03-07 NOTE — Anesthesia Preprocedure Evaluation (Signed)
Anesthesia Evaluation  Patient identified by MRN, date of birth, ID band Patient awake    Reviewed: Allergy & Precautions, H&P , Patient's Chart, lab work & pertinent test results, reviewed documented beta blocker date and time   History of Anesthesia Complications Negative for: history of anesthetic complications  Airway Mallampati: II TM Distance: >3 FB Neck ROM: full    Dental no notable dental hx.    Pulmonary neg pulmonary ROS,  breath sounds clear to auscultation  Pulmonary exam normal       Cardiovascular Exercise Tolerance: Good hypertension, negative cardio ROS  Rhythm:regular Rate:Normal     Neuro/Psych PSYCHIATRIC DISORDERS Anxiety negative neurological ROS  negative psych ROS   GI/Hepatic negative GI ROS, Neg liver ROS, GERD-  Controlled,  Endo/Other  negative endocrine ROSdiabetesMorbid obesity  Renal/GU Renal diseasenegative Renal ROS     Musculoskeletal   Abdominal   Peds  Hematology negative hematology ROS (+)   Anesthesia Other Findings Anxiety     Low back pain syndrome        DJD (degenerative joint disease)     Nephrolithiasis        BPH (benign prostatic hypertrophy) with urinary obstruction     Cholelithiasis        Hemorrhoids     History of colonic polyps        Diverticulosis of colon     GERD (gastroesophageal reflux disease)        Diabetes mellitus     Hyperlipidemia        History of DVT (deep vein thrombosis)     Venous insufficiency        Varicose veins of lower extremities     Hypertension, benign        pulmonary nodule, left lower lobe     Clotting disorder        History of kidney stones    Reproductive/Obstetrics negative OB ROS                           Anesthesia Physical Anesthesia Plan  ASA: III  Anesthesia Plan: General ETT   Post-op Pain Management:    Induction:   Airway Management Planned:   Additional Equipment:   Intra-op Plan:    Post-operative Plan:   Informed Consent: I have reviewed the patients History and Physical, chart, labs and discussed the procedure including the risks, benefits and alternatives for the proposed anesthesia with the patient or authorized representative who has indicated his/her understanding and acceptance.   Dental Advisory Given  Plan Discussed with: CRNA and Surgeon  Anesthesia Plan Comments:         Anesthesia Quick Evaluation

## 2012-03-07 NOTE — Interval H&P Note (Signed)
History and Physical Interval Note:  03/07/2012 9:57 AM  [name]  has presented today for surgery, with the diagnosis of syptomactoc cholelithiasis  The various methods of treatment have been discussed with the patient and family. After consideration of risks, benefits and other options for treatment, the patient has consented to  Procedure(s): LAPAROSCOPIC CHOLECYSTECTOMY (N/A) INTRAOPERATIVE CHOLANGIOGRAM (N/A) as a surgical intervention .  The patient's history has been reviewed, patient examined, no change in status, stable for surgery.  I have reviewed the patient's chart and labs.  Questions were answered to the patient's satisfaction.     WAKEFIELD,MATTHEW

## 2012-03-07 NOTE — Progress Notes (Signed)
UR completed 

## 2012-03-07 NOTE — Op Note (Signed)
Preoperative diagnosis: Biliary colic Postoperative diagnosis: same as above Procedure: Laparoscopic cholecystectomy Surgeon: Dr Harden Mo Asst: Dr. Ovidio Kin Anesthesia: General  EBL: minimal Drains: none Specimen: gb and contents to pathology Complications: none Sponge and needle count correct times two at end of operation Disposition: to recovery stable  Indications: This is a 28 yom who has had recurrent pain in epigastrium and chest.  This has been evaluated by GI and cardiology and does not appear to be cardiac or related to findings on EGD.  He has stones on ruq u/s and was sent to me for evaluation for biliary colic.  Sounded like this might be the source of his symptoms. Due to this he and I discussed a laparoscopic cholecystectomy. The risks and benefits were discussed prior to beginning.  Procedure: After informed consent was obtained the patient was taken to the operating room. I did give him subcutaneous heparin prior to the surgery. Sequential compression devices were on his legs. He was given 2 g of cefoxitin. He was then placed under general endotracheal anesthesia without complication. His abdomen was prepped and draped in the standard sterile surgical fashion. A surgical timeout was performed.  I infiltrated Marcaine below his umbilicus. I made a vertical incision. I identified his fascia and entered it sharply. His peritoneum was entered bluntly. I then placed a 0 Vicryl pursestring suture in the fascia. A Hassan trocar was introduced and the abdomen was insufflated to 15 mmHg pressure. We then placed 3 further 5 mm trocars in the epigastrium and right upper quadrant under direct vision without complication. His gallbladder was then retracted cephalad. He was noted to have a fair amount of distention. I decompressed his gallbladder first. He had a lot of  fat that was encasing the gallbladder that was taken down bluntly. I ended up having to use a 45 mm long scope due to  his body habitus. This was very difficult to his body habitus as well as the omentum. Eventually I was able to identify the critical view of safety. I clipped the artery and divided it. I treated the duct in a similar fashion. I then removed the gallbladder from the liver bed. I did make a small entrance of the gallbladder and there was a small spillage of bile. The gallbladder was unroof the liver bed and placed in an Endo Catch bag. This was then removed from the umbilicus. I then obtained hemostasis. Irrigation was performed until this was clear. I did place a piece of Surgicel in the gallbladder bed as I will be beginning him on subcutaneous heparin immediately postoperatively. I then removed my Hassan trocar and tied this down. I placed one additional figure-of-eight 0 Vicryl suture to completely obliterate the umbilical defect. I viewed this from the epigastrium and he had no evidence of an entry injury. I then desufflated his abdomen removed all trocars. These sites were closed with Monocryl and Dermabond. He tolerated this well was extubated and transferred to recovery in stable condition.

## 2012-03-07 NOTE — Preoperative (Signed)
Beta Blockers   Reason not to administer Beta Blockers:Not Applicable 

## 2012-03-07 NOTE — Progress Notes (Signed)
ARRIVED TO ROOM 6N02 FROM PACU, A/OX4, MOVED SELF FROM STRETCHER TO BED WITHOUT DIFFICULTY, DENIES PAIN/NAUSEA AT THIS TIME, WIFE AT BEDSIDE

## 2012-03-07 NOTE — Anesthesia Procedure Notes (Addendum)
Procedure Name: Intubation Date/Time: 03/07/2012 10:04 AM Performed by: Orvilla Fus A Pre-anesthesia Checklist: Patient identified, Timeout performed, Emergency Drugs available, Suction available and Patient being monitored Patient Re-evaluated:Patient Re-evaluated prior to inductionOxygen Delivery Method: Circle system utilized Preoxygenation: Pre-oxygenation with 100% oxygen Intubation Type: IV induction Ventilation: Oral airway inserted - appropriate to patient size, Two handed mask ventilation required and Mask ventilation with difficulty Laryngoscope Size: Mac and 4 Grade View: Grade I Tube type: Oral Tube size: 7.5 mm Number of attempts: 1 Airway Equipment and Method: Stylet Placement Confirmation: ETT inserted through vocal cords under direct vision,  breath sounds checked- equal and bilateral and positive ETCO2 Secured at: 23 cm Tube secured with: Tape Dental Injury: Teeth and Oropharynx as per pre-operative assessment     Anesthesia Regional Block:   Narrative:    Anesthesia Regional Block:   Narrative:

## 2012-03-07 NOTE — Anesthesia Postprocedure Evaluation (Signed)
  Anesthesia Post Note  Patient: [name]  Procedure(s) Performed: Procedure(s) (LRB): LAPAROSCOPIC CHOLECYSTECTOMY (N/A)  Anesthesia type: GA  Patient location: PACU  Post pain: Pain level controlled  Post assessment: Post-op Vital signs reviewed  Last Vitals:  Filed Vitals:   03/07/12 1145  BP: 143/70  Pulse: 83  Temp:   Resp: 15    Post vital signs: Reviewed  Level of consciousness: sedated  Complications: No apparent anesthesia complications

## 2012-03-08 ENCOUNTER — Other Ambulatory Visit (INDEPENDENT_AMBULATORY_CARE_PROVIDER_SITE_OTHER): Payer: Self-pay | Admitting: General Surgery

## 2012-03-08 ENCOUNTER — Encounter (HOSPITAL_COMMUNITY): Payer: Self-pay | Admitting: General Surgery

## 2012-03-08 ENCOUNTER — Telehealth (INDEPENDENT_AMBULATORY_CARE_PROVIDER_SITE_OTHER): Payer: Self-pay

## 2012-03-08 LAB — BASIC METABOLIC PANEL
CO2: 26 mEq/L (ref 19–32)
GFR calc non Af Amer: 74 mL/min — ABNORMAL LOW (ref >90)
Glucose, Bld: 153 mg/dL — ABNORMAL HIGH (ref 70–99)
Potassium: 4.2 mEq/L (ref 3.5–5.1)
Sodium: 140 mEq/L (ref 135–145)

## 2012-03-08 LAB — URINALYSIS, ROUTINE W REFLEX MICROSCOPIC
Glucose, UA: 100 mg/dL — AB
Protein, ur: NEGATIVE mg/dL
pH: 5.5 (ref 5.0–8.0)

## 2012-03-08 LAB — URINE MICROSCOPIC-ADD ON

## 2012-03-08 LAB — GLUCOSE, CAPILLARY: Glucose-Capillary: 141 mg/dL — ABNORMAL HIGH (ref 70–99)

## 2012-03-08 MED ORDER — ENOXAPARIN SODIUM 40 MG/0.4ML ~~LOC~~ SOLN
40.0000 mg | Freq: Once | SUBCUTANEOUS | Status: AC
  Administered 2012-03-08: 40 mg via SUBCUTANEOUS
  Filled 2012-03-08: qty 0.4

## 2012-03-08 MED ORDER — PHENAZOPYRIDINE HCL 200 MG PO TABS
200.0000 mg | ORAL_TABLET | Freq: Once | ORAL | Status: AC
  Administered 2012-03-08: 200 mg via ORAL
  Filled 2012-03-08: qty 1

## 2012-03-08 MED ORDER — OXYCODONE HCL 5 MG PO TABS: 5.0000 mg | ORAL_TABLET | ORAL | Status: DC | PRN

## 2012-03-08 MED ORDER — SULFAMETHOXAZOLE-TRIMETHOPRIM 800-160 MG PO TABS: 1.0000 | ORAL_TABLET | Freq: Two times a day (BID) | ORAL | Status: DC

## 2012-03-08 MED ORDER — ENOXAPARIN SODIUM 150 MG/ML ~~LOC~~ SOLN: 40.0000 mg | Freq: Every day | SUBCUTANEOUS | Status: DC

## 2012-03-08 NOTE — Progress Notes (Signed)
Pt discharged to home

## 2012-03-08 NOTE — Telephone Encounter (Signed)
Called pt's wife to let her know that Dr Dwain Sarna called a rx in for UTI. The wife is really upset crying now while im on the phone with her b/c she is so confused about the Lovenox injections. The pt was instructed to do the Lovenox injections for 5 days after surgery. The pt picked up the Lovenox injections at the pharmacy and was told she had 2 syringes pre filled but she will have to reuse the syringes the 3 other days to give the Lovenox injections. The pt said she has never had to reuse a syringe and needle to give the Lovenox injection. The pt is really worried about drawing up the correct amount of Lovenox for the pt. The pt doesn't understand why all 5 syringes prefilled did not come with the order. I told the pt that I would page Dr Dwain Sarna and call her back.

## 2012-03-08 NOTE — Discharge Summary (Signed)
Physician Discharge Summary  Patient ID: [name] MRN: [id number] DOB/AGE: 02-02-1945 67 y.o.  Admit date: 03/07/2012 Discharge date: 03/08/2012  Admission Diagnoses: Diabetes HTN History of dvt Gallstones  Discharge Diagnoses:  Active Problems:   * No active hospital problems. *   Discharged Condition: stable  Hospital Course: 54 yom with chest pain, epigastric pain who has undergone cardiac and gi evaluation and found to have gallstones. He appeared to have biliary colic.  I performed a laparoscopic cholecystectomy without complication.  He remained overnight.  He had some burning with urination and frequency the following morning.  His ua showed a possibly uti and will discharge on bactrim.  He was tolerating a diet and otherwise well. Will discharge home on five days of prophylactic lovenox.   Consults: none  Significant Diagnostic Studies: none  Treatments: laparoscopic cholecystectomy   Disposition: 01-Home or Self Care   Future Appointments Provider Department Dept Phone   03/24/2012 11:10 AM Emelia Loron, MD [facility] [place] [phone]   04/06/2012 10:00 AM Michele Mcalpine, MD [facility] [phone]   11/23/2012 9:00 AM Michele Mcalpine, MD [facility] [phone]       Medication List    TAKE these medications       aspirin EC 81 MG tablet  Take 81 mg by mouth daily.     cetirizine 10 MG tablet  Commonly known as:  ZYRTEC  Take 10 mg by mouth daily.     diclofenac 75 MG EC tablet  Commonly known as:  VOLTAREN  Take 75 mg by mouth 2 (two) times daily.     enoxaparin 150 MG/ML injection  Commonly known as:  LOVENOX  Inject 0.27 mLs (40 mg total) into the skin daily.     glimepiride 2 MG tablet  Commonly known as:  AMARYL  Take 1 mg by mouth 2 (two) times daily after a meal.     metFORMIN 500 MG tablet  Commonly known as:  GLUCOPHAGE  Take 1 tablet (500 mg total) by mouth 2 (two) times daily with a meal.     multivitamin per tablet  Take 1 tablet by mouth daily.     oxyCODONE 5 MG immediate release tablet  Commonly known as:  Oxy IR/ROXICODONE  Take 1 tablet (5 mg total) by mouth every 4 (four) hours as needed for pain.     pantoprazole 40 MG tablet  Commonly known as:  PROTONIX  Take 1 tablet (40 mg total) by mouth 2 (two) times daily.     ranitidine 150 MG capsule  Commonly known as:  ZANTAC  Take 150 mg by mouth at bedtime.     RAPAFLO 4 MG Caps capsule  Generic drug:  silodosin  Take 4 mg by mouth daily after breakfast.     simvastatin 40 MG tablet  Commonly known as:  ZOCOR  Take 40 mg by mouth at bedtime.     valsartan 160 MG tablet  Commonly known as:  DIOVAN  Take 80 mg by mouth daily.           Follow-up Information   Follow up with [facility], [place] In 3 weeks.   Contact information:   [address] [place] [address] [phone]       Signed: Emelia Loron 03/08/2012, 9:58 PM

## 2012-03-08 NOTE — Telephone Encounter (Signed)
Spoke to Dr Dwain Sarna and the Lovenox injection rx was ordered wrong so he wants me to call the pt's pharmacy and give a verbal on the Lovenox 40mg  injection take daily sub q x 5days. I called the [facility] spoke to the pharmacist about the mistake and she said someone already had fixed the mistake for the pt. The pharmacist said the pt already picked up a rx for Lovenox injections 40mg  for 7 days. I told the pharmacist that I didn't give that order for 7 days that I was just now getting the ok from Dr Dwain Sarna to fix the mistake. I told Arline Asp the pharmacist that the Lovenox injection rx is correct except the quanity is not for 7 days but for 5 days. I called the pt to let him know the rx for Lovenox they just picked up had 2 extra days in the script and they were fully aware of the extra. The pt knows to only take the Lovenox injections for 5 days sub q after surgery.

## 2012-03-08 NOTE — Progress Notes (Signed)
1 Day Post-Op  Subjective: Frequency of urination with some burning, better this am, otherwise wants to go home  Objective: Vital signs in last 24 hours: Temp:  [97.7 F (36.5 C)-98.5 F (36.9 C)] 98 F (36.7 C) (03/05 0508) Pulse Rate:  [83-107] 88 (03/05 0508) Resp:  [15-25] 20 (03/05 0508) BP: (134-163)/(62-98) 142/80 mmHg (03/05 0508) SpO2:  [91 %-97 %] 94 % (03/05 0508) Weight:  [259 lb 4.2 oz (117.6 kg)] 259 lb 4.2 oz (117.6 kg) (03/04 2300) Last BM Date: 03/05/12  Intake/Output from previous day: 03/04 0701 - 03/05 0700 In: 1879 [P.O.:440; I.V.:1439] Out: 725 [Urine:700; Blood:25] Intake/Output this shift:    General appearance: no distress GI: soft, nontender, incisions clean without infection  Lab Results:   Recent Labs  03/07/12 1310  WBC 11.5*  HGB 13.3  HCT 40.8  PLT 250    Assessment/Plan: S/p lap chole  Will check ua but I think mostly due to fluid and bph Will dc home today on five days prophylactic lovenox  WAKEFIELD,MATTHEW 03/08/2012

## 2012-03-08 NOTE — Progress Notes (Signed)
Pt c/o burning and frequent urination,hx of BPH with obstruction,md on call notified,new order received.

## 2012-03-10 ENCOUNTER — Ambulatory Visit: Payer: Medicare Other | Admitting: Pulmonary Disease

## 2012-03-11 ENCOUNTER — Ambulatory Visit (INDEPENDENT_AMBULATORY_CARE_PROVIDER_SITE_OTHER): Payer: Medicare Other | Admitting: Family Medicine

## 2012-03-11 VITALS — BP 140/80 | HR 78 | Temp 98.4°F | Resp 18 | Ht 70.0 in | Wt 264.0 lb

## 2012-03-11 DIAGNOSIS — R350 Frequency of micturition: Secondary | ICD-10-CM

## 2012-03-11 DIAGNOSIS — R109 Unspecified abdominal pain: Secondary | ICD-10-CM | POA: Diagnosis not present

## 2012-03-11 DIAGNOSIS — R338 Other retention of urine: Secondary | ICD-10-CM

## 2012-03-11 DIAGNOSIS — R1030 Lower abdominal pain, unspecified: Secondary | ICD-10-CM

## 2012-03-11 DIAGNOSIS — N400 Enlarged prostate without lower urinary tract symptoms: Secondary | ICD-10-CM | POA: Diagnosis not present

## 2012-03-11 LAB — POCT URINALYSIS DIPSTICK
Protein, UA: NEGATIVE
Spec Grav, UA: 1.015
Urobilinogen, UA: 0.2
pH, UA: 5.5

## 2012-03-11 LAB — POCT UA - MICROSCOPIC ONLY
Casts, Ur, LPF, POC: NEGATIVE
Crystals, Ur, HPF, POC: NEGATIVE
Epithelial cells, urine per micros: NEGATIVE

## 2012-03-11 NOTE — Progress Notes (Signed)
Subjective:    Patient ID: [name], male    DOB: Jul 29, 1945, 68 y.o.   MRN: [id number]  HPI [name] is a 67 y.o. male   S/p laparoscopic cholecystectomy 03/07/12.  Burning with urination and frequency noted 03/08/12 and dischrged on Bactrim for possible UTI (positive nitrite and LE on u/a).  Remote hx of DVT, and discharged on 5 days prophylactic lovenox for this.   No catheterization during surgery, but urinary frequency starting night after surgery.  Started bactrim DS - one left. Still with frequent urination, lower abdominal pressure, urinary leakage - trouble getting to the bathroom.  Called urologist - spoke with on call urologist this morning. Advise to come here for eval.  Is still making urine - took rapaflo this morning at 8 am, then good flow 1 hour after this.  Since then - small amount at time - trickling since earlier this morning.   Hx of BPH, with PSA in the 6.04 11/23/11. (Dr. Kriste Basque is PCP - had it checked there) Urologist: Dr. Patsi Sears.  Hx of nephrolithiasis.   Takes Rapaflo for BPH, but took flomax while in hospital.     Patient Active Problem List  Diagnosis  . COLONIC POLYPS  . Diabetes mellitus  . HYPERLIPIDEMIA  . ANXIETY  . HYPERTENSION, BENIGN  . DEEP VEIN THROMBOSIS  . VARICOSE VEINS, LOWER EXTREMITIES  . HEMORRHOIDS  . VENOUS INSUFFICIENCY  . G E R D  . DIVERTICULOSIS OF COLON  . CHOLELITHIASIS  . NEPHROLITHIASIS  . BENIGN PROSTATIC HYPERTROPHY, WITH OBSTRUCTION  . DEGENERATIVE JOINT DISEASE  . LOW BACK PAIN SYNDROME  . SYNCOPE  . HYPERSOMNIA  . Obesity  . URI, acute  . Well controlled type 2 diabetes mellitus  . Chest tightness  . Precordial pain   Current Outpatient Prescriptions on File Prior to Visit  Medication Sig Dispense Refill  . aspirin EC 81 MG tablet Take 81 mg by mouth daily.      . cetirizine (ZYRTEC) 10 MG tablet Take 10 mg by mouth daily.       . diclofenac (VOLTAREN) 75 MG EC tablet Take 75 mg by mouth 2 (two) times  daily.      [name] enoxaparin (LOVENOX) 150 MG/ML injection Inject 0.27 mLs (40 mg total) into the skin daily.  2 Syringe  0  . glimepiride (AMARYL) 2 MG tablet Take 1 mg by mouth 2 (two) times daily after a meal.       . metFORMIN (GLUCOPHAGE) 500 MG tablet Take 1 tablet (500 mg total) by mouth 2 (two) times daily with a meal.  180 tablet  3  . multivitamin (THERAGRAN) per tablet Take 1 tablet by mouth daily.       [name] oxyCODONE (OXY IR/ROXICODONE) 5 MG immediate release tablet Take 1 tablet (5 mg total) by mouth every 4 (four) hours as needed for pain.  30 tablet  0  . pantoprazole (PROTONIX) 40 MG tablet Take 1 tablet (40 mg total) by mouth 2 (two) times daily.  60 tablet  5  . ranitidine (ZANTAC) 150 MG capsule Take 150 mg by mouth at bedtime.       . silodosin (RAPAFLO) 4 MG CAPS capsule Take 4 mg by mouth daily after breakfast.       . simvastatin (ZOCOR) 40 MG tablet Take 40 mg by mouth at bedtime.      . sulfamethoxazole-trimethoprim (BACTRIM DS,SEPTRA DS) 800-160 MG per tablet Take 1 tablet by mouth 2 (two) times  daily.  8 tablet  0  . valsartan (DIOVAN) 160 MG tablet Take 80 mg by mouth daily.       No current facility-administered medications on file prior to visit.   Allergies  Allergen Reactions  . Ciprofloxacin     REACTION: rash  . Niacin     REACTION: hypertension, shaky   History   Social History  . Marital Status: Married    Spouse Name: N/A    Number of Children: 2  . Years of Education: N/A   Occupational History  . Not on file.   Social History Main Topics  . Smoking status: Never Smoker   . Smokeless tobacco: Never Used  . Alcohol Use: No     Comment: social  use  . Drug Use: No  . Sexually Active: Not on file   Other Topics Concern  . Not on file   Social History Narrative   Retired.      Review of Systems  Constitutional: Negative for fever and chills.  Gastrointestinal: Positive for abdominal pain. Negative for nausea and vomiting.    Genitourinary: Positive for urgency, frequency, decreased urine volume and difficulty urinating. Negative for penile pain.       Objective:   Physical Exam  Vitals reviewed. Constitutional: He appears well-developed and well-nourished.  HENT:  Head: Normocephalic and atraumatic.  Pulmonary/Chest: Effort normal.  Skin: Skin is warm and dry.         Results for orders placed in visit on 03/11/12  POCT UA - MICROSCOPIC ONLY      Result Value Range   WBC, Ur, HPF, POC 1-2     RBC, urine, microscopic 5-7     Bacteria, U Microscopic trace     Mucus, UA pos     Epithelial cells, urine per micros neg     Crystals, Ur, HPF, POC neg     Casts, Ur, LPF, POC neg     Yeast, UA neg    POCT URINALYSIS DIPSTICK      Result Value Range   Color, UA yellow     Clarity, UA clear     Glucose, UA 100     Bilirubin, UA neg     Ketones, UA neg     Spec Grav, UA 1.015     Blood, UA small     pH, UA 5.5     Protein, UA neg     Urobilinogen, UA 0.2     Nitrite, UA neg     Leukocytes, UA Negative     Risks (including but not limited to bleeding and infection), benefits, and alternatives discussed for foley catheter placement for urinary retention. Verbal consent obtained after all questions were answered.  End of penis area cleansed with Betadine x3, draped in sterile fashion. Approximately o.5cc of 2%lidocaine gel applied to penile uretra, followed by 14 french catheter in sterile fashion with lubricant.  No restrictions - passed easily with yellow urine flow without difficulty.  Bulb insufflated to 9cc of sterile water. 1700cc total urine obtained after approximately 20-25 minutes. No complications. Denied orthostasis sx's.  Leg bag placed and instructed on care/emptying with patient and wife, as well as on handout.  Again verified at end of visit- no lighheadedness, dizziness, penile or abdominal pain and felt much less abd pressure.     Assessment & Plan:  [name] is a 67 y.o.  male Urinary frequency - Plan: POCT UA - Microscopic Only, POCT urinalysis dipstick  Acute  urinary retention  BPH (benign prostatic hyperplasia)  Suprapubic abdominal pain, unspecified laterality  Urinary retention - postoperative likely.  No apparent prior instrumentation.  Discussed with urologist on call, including above urine results, and prior tx. .  Recommended trial of indwelling catheter placement, urine culture, one more dose of Bactrim only,  and call to urologist in 2 days for follow up.  Catheter to remain in place until follow up.  See procedure above. 14 french indwelling catheter placed, and attached to leg bag, with 2 straps on leg bag.  No complications, and aftercare discussed.   Patient Instructions  Continue your usual medicines.  Take last dose of Bactrim as discussed. Keep catheter in place until removed by urology - call your urologist Monday morning to be evaluated.  Advise them that a urinary catheter (14 - french = size), was placed for urinary retention as discussed wit Dr. Margarita Grizzle. Return to the clinic or go to the nearest emergency room if any of your symptoms worsen or new symptoms occur.    2100 Addendum: called patient - spoke to wife. Doing well, still keeping track of output and catheter flowing smoothly. Plan on coming by [facility] in the am and I will check status of catheter.

## 2012-03-11 NOTE — Patient Instructions (Addendum)
Continue your usual medicines.  Take last dose of Bactrim as discussed. Keep catheter in place until removed by urology - call your urologist Monday morning to be evaluated.  Advise them that a urinary catheter (14 - french = size), was placed for urinary retention as discussed wit Dr. Margarita Grizzle. Return to the clinic or go to the nearest emergency room if any of your symptoms worsen or new symptoms occur. Foley Catheter Care, Adult A soft, flexible tube (Foley catheter) has been placed in your bladder. This may be done to temporarily help with urine drainage after an operation or to relieve blockage from an enlarged prostate gland. HOME CARE INSTRUCTIONS  If you are going home with a Foley catheter in place, follow these instructions: Taking Care of the Catheter:  Keep the area where the catheter leaves your body clean.  Attach the catheter to the leg so there is no tension on the catheter.  Keep the drainage bag below the level of the bladder, but keep it OFF the floor.  Do not take long soaking baths. Your caregiver will give instructions about showering.  Wash your hands before touching ANYTHING related to the catheter or bag.  Using mild soap and warm water on a washcloth:  Clean the area closest to the catheter insertion site using a circular motion around the catheter.  Clean the catheter itself by wiping AWAY from the insertion site for several inches down the tube.  NEVER wipe upward as this could sweep bacteria up into the urethra (tube in your body that normally drains the bladder) and cause infection. Taking Care of the Drainage Bags:  Two drainage bags will be taken home: a large overnight drainage bag, and a smaller leg bag which fits underneath clothing.  It is okay to wear the overnight bag at any time, but NEVER wear the smaller leg bag at night.  Keep the drainage bag well below the level of your bladder. This prevents backflow of urine into the bladder and allows the  urine to drain freely.  Anchor the tubing to your leg to prevent pulling or tension on the catheter. Use tape or a leg strap provided by the hospital.  Empty the drainage bag when it is  to  full. Wash your hands before and after touching the bag.  Periodically check the tubing for kinks to make sure there is no pressure on the tubing which could restrict the flow of urine. Changing the Drainage Bags:  Cleanse both ends of the clean bag with alcohol before changing.  Pinch off the rubber catheter to avoid urine spillage during the disconnection.  Disconnect the dirty bag and connect the clean one.  Empty the dirty bag carefully to avoid a urine spill.  Attach the new bag to the leg with tape or a leg strap. Cleaning the Drainage Bags:  Whenever a drainage bag is disconnected, it must be cleaned quickly so it is ready for the next use.  Wash the bag in warm, soapy water.  Rinse the bag thoroughly with warm water.  Soak the bag for 30 minutes in a solution of white vinegar and water (1 cup vinegar to 1 quart warm water).  Rinse with warm water. SEEK MEDICAL CARE IF:   Some pain develops in the kidney (lower back) area.  The urine is cloudy or smells bad.  There is some blood in the urine.  The catheter becomes clogged and/or there is no urine drainage. SEEK IMMEDIATE MEDICAL CARE IF:  You have moderate or severe pain in the kidney region.  You start to throw up (vomit).  Blood fills the tube.  Worsening belly (abdominal) pain develops.  You have a fever. MAKE SURE YOU:   Understand these instructions.  Will watch your condition.  Will get help right away if you are not doing well or get worse. Document Released: 12/21/2004 Document Revised: 03/15/2011 Document Reviewed: 06/17/2006 [facility] Patient Information 2013 [place], [place].

## 2012-03-12 NOTE — Progress Notes (Signed)
Recheck in office 03/12/12 - some cramps last night in both legs, none now. No abd pain, no hematuria, no penile irritation. Catheter examined, slight tension to advance, min movement.  Taped to leg.  rtc precautions including any abd pain or hematuria, or return of cramps in legs. Plan on urology eval tomorrow.

## 2012-03-13 ENCOUNTER — Telehealth: Payer: Self-pay

## 2012-03-13 DIAGNOSIS — R339 Retention of urine, unspecified: Secondary | ICD-10-CM | POA: Diagnosis not present

## 2012-03-13 DIAGNOSIS — N401 Enlarged prostate with lower urinary tract symptoms: Secondary | ICD-10-CM | POA: Diagnosis not present

## 2012-03-13 LAB — URINE CULTURE: Colony Count: NO GROWTH

## 2012-03-13 NOTE — Telephone Encounter (Signed)
Wife called and said the problem was worked out with the urologist and does not need a call back.  Thank you.

## 2012-03-13 NOTE — Telephone Encounter (Signed)
Dr Neva Seat   Please call patient regarding his ov this weekend.   [phone] (H)

## 2012-03-22 DIAGNOSIS — R339 Retention of urine, unspecified: Secondary | ICD-10-CM | POA: Diagnosis not present

## 2012-03-22 DIAGNOSIS — R972 Elevated prostate specific antigen [PSA]: Secondary | ICD-10-CM | POA: Diagnosis not present

## 2012-03-22 DIAGNOSIS — N401 Enlarged prostate with lower urinary tract symptoms: Secondary | ICD-10-CM | POA: Diagnosis not present

## 2012-03-24 ENCOUNTER — Ambulatory Visit (INDEPENDENT_AMBULATORY_CARE_PROVIDER_SITE_OTHER): Payer: Medicare Other | Admitting: General Surgery

## 2012-03-24 ENCOUNTER — Encounter (INDEPENDENT_AMBULATORY_CARE_PROVIDER_SITE_OTHER): Payer: Self-pay | Admitting: General Surgery

## 2012-03-24 VITALS — BP 110/68 | HR 94 | Temp 98.1°F | Resp 18 | Ht 70.0 in | Wt 256.6 lb

## 2012-03-24 DIAGNOSIS — Z09 Encounter for follow-up examination after completed treatment for conditions other than malignant neoplasm: Secondary | ICD-10-CM

## 2012-03-27 DIAGNOSIS — R339 Retention of urine, unspecified: Secondary | ICD-10-CM | POA: Diagnosis not present

## 2012-03-28 DIAGNOSIS — N401 Enlarged prostate with lower urinary tract symptoms: Secondary | ICD-10-CM | POA: Diagnosis not present

## 2012-03-28 DIAGNOSIS — R339 Retention of urine, unspecified: Secondary | ICD-10-CM | POA: Diagnosis not present

## 2012-03-28 DIAGNOSIS — N312 Flaccid neuropathic bladder, not elsewhere classified: Secondary | ICD-10-CM | POA: Diagnosis not present

## 2012-03-28 NOTE — Progress Notes (Signed)
Subjective:     Patient ID: [name], male   DOB: 06/14/1945, 67 y.o.   MRN: [id number]  HPI 89 yom who underwent lap chole for symptomatic cholelithiasis with pathology showing chronic cholecystitis and cholelithiasis.  He remained in hospital overnight and was doing well. When I saw him the following day he stated he had some dysuria but was voiding.  I checked a ua when he was leaving.  There was not really a need for catheter at time of surgery and it appeared he was voiding when I saw him the following day.  He has done very well from gb but he developed acute urinary retention requiring catheterization.  He is now being followed by Dr. Patsi Sears with minimal improvement to this point.  He is otherwise well and is tol diet.  Review of Systems     Objective:   Physical Exam Incisions healed without infection    Assessment:     S/p lap chole Urinary retention      Plan:     He is released to full activity from gb standpoint and can eat what he wants.  I asked him to see me as needed.  We discussed pathology.  I talked to he, his wife and daughter for about 20 minutes about retention.  He will f/u with Dr. Patsi Sears and I appreciate his assistance.

## 2012-04-06 ENCOUNTER — Ambulatory Visit: Payer: Medicare Other | Admitting: Pulmonary Disease

## 2012-04-17 DIAGNOSIS — N200 Calculus of kidney: Secondary | ICD-10-CM | POA: Diagnosis not present

## 2012-04-17 DIAGNOSIS — N312 Flaccid neuropathic bladder, not elsewhere classified: Secondary | ICD-10-CM | POA: Diagnosis not present

## 2012-04-17 DIAGNOSIS — N401 Enlarged prostate with lower urinary tract symptoms: Secondary | ICD-10-CM | POA: Diagnosis not present

## 2012-04-17 DIAGNOSIS — N3000 Acute cystitis without hematuria: Secondary | ICD-10-CM | POA: Diagnosis not present

## 2012-04-24 ENCOUNTER — Encounter (INDEPENDENT_AMBULATORY_CARE_PROVIDER_SITE_OTHER): Payer: Self-pay

## 2012-04-25 DIAGNOSIS — N312 Flaccid neuropathic bladder, not elsewhere classified: Secondary | ICD-10-CM | POA: Diagnosis not present

## 2012-04-25 DIAGNOSIS — N200 Calculus of kidney: Secondary | ICD-10-CM | POA: Diagnosis not present

## 2012-05-03 DIAGNOSIS — N312 Flaccid neuropathic bladder, not elsewhere classified: Secondary | ICD-10-CM | POA: Diagnosis not present

## 2012-05-09 DIAGNOSIS — E1139 Type 2 diabetes mellitus with other diabetic ophthalmic complication: Secondary | ICD-10-CM | POA: Diagnosis not present

## 2012-05-10 ENCOUNTER — Ambulatory Visit: Payer: Medicare Other | Admitting: Pulmonary Disease

## 2012-05-10 DIAGNOSIS — N312 Flaccid neuropathic bladder, not elsewhere classified: Secondary | ICD-10-CM | POA: Diagnosis not present

## 2012-05-10 DIAGNOSIS — N401 Enlarged prostate with lower urinary tract symptoms: Secondary | ICD-10-CM | POA: Diagnosis not present

## 2012-05-17 DIAGNOSIS — R339 Retention of urine, unspecified: Secondary | ICD-10-CM | POA: Diagnosis not present

## 2012-05-17 DIAGNOSIS — N401 Enlarged prostate with lower urinary tract symptoms: Secondary | ICD-10-CM | POA: Diagnosis not present

## 2012-05-17 DIAGNOSIS — N312 Flaccid neuropathic bladder, not elsewhere classified: Secondary | ICD-10-CM | POA: Diagnosis not present

## 2012-05-24 DIAGNOSIS — N312 Flaccid neuropathic bladder, not elsewhere classified: Secondary | ICD-10-CM | POA: Diagnosis not present

## 2012-05-31 DIAGNOSIS — N401 Enlarged prostate with lower urinary tract symptoms: Secondary | ICD-10-CM | POA: Diagnosis not present

## 2012-05-31 DIAGNOSIS — N312 Flaccid neuropathic bladder, not elsewhere classified: Secondary | ICD-10-CM | POA: Diagnosis not present

## 2012-06-07 DIAGNOSIS — N401 Enlarged prostate with lower urinary tract symptoms: Secondary | ICD-10-CM | POA: Diagnosis not present

## 2012-06-07 DIAGNOSIS — N312 Flaccid neuropathic bladder, not elsewhere classified: Secondary | ICD-10-CM | POA: Diagnosis not present

## 2012-09-06 ENCOUNTER — Encounter: Payer: Self-pay | Admitting: Pulmonary Disease

## 2012-09-06 ENCOUNTER — Ambulatory Visit (INDEPENDENT_AMBULATORY_CARE_PROVIDER_SITE_OTHER): Payer: Medicare Other | Admitting: Pulmonary Disease

## 2012-09-06 DIAGNOSIS — K219 Gastro-esophageal reflux disease without esophagitis: Secondary | ICD-10-CM

## 2012-09-06 DIAGNOSIS — I872 Venous insufficiency (chronic) (peripheral): Secondary | ICD-10-CM

## 2012-09-06 DIAGNOSIS — M545 Low back pain, unspecified: Secondary | ICD-10-CM

## 2012-09-06 DIAGNOSIS — I10 Essential (primary) hypertension: Secondary | ICD-10-CM | POA: Diagnosis not present

## 2012-09-06 DIAGNOSIS — D126 Benign neoplasm of colon, unspecified: Secondary | ICD-10-CM

## 2012-09-06 DIAGNOSIS — E785 Hyperlipidemia, unspecified: Secondary | ICD-10-CM | POA: Diagnosis not present

## 2012-09-06 DIAGNOSIS — K5730 Diverticulosis of large intestine without perforation or abscess without bleeding: Secondary | ICD-10-CM

## 2012-09-06 DIAGNOSIS — N401 Enlarged prostate with lower urinary tract symptoms: Secondary | ICD-10-CM

## 2012-09-06 DIAGNOSIS — I8390 Asymptomatic varicose veins of unspecified lower extremity: Secondary | ICD-10-CM | POA: Diagnosis not present

## 2012-09-06 DIAGNOSIS — E119 Type 2 diabetes mellitus without complications: Secondary | ICD-10-CM

## 2012-09-06 DIAGNOSIS — F411 Generalized anxiety disorder: Secondary | ICD-10-CM

## 2012-09-06 DIAGNOSIS — M1990 Unspecified osteoarthritis, unspecified site: Secondary | ICD-10-CM

## 2012-09-06 DIAGNOSIS — I8393 Asymptomatic varicose veins of bilateral lower extremities: Secondary | ICD-10-CM

## 2012-09-06 DIAGNOSIS — N138 Other obstructive and reflux uropathy: Secondary | ICD-10-CM

## 2012-09-06 DIAGNOSIS — N200 Calculus of kidney: Secondary | ICD-10-CM

## 2012-09-06 MED ORDER — METFORMIN HCL 500 MG PO TABS: 500.0000 mg | ORAL_TABLET | Freq: Two times a day (BID) | ORAL | Status: DC

## 2012-09-06 MED ORDER — SIMVASTATIN 40 MG PO TABS: 40.0000 mg | ORAL_TABLET | Freq: Every day | ORAL | Status: DC

## 2012-09-06 MED ORDER — GLIMEPIRIDE 2 MG PO TABS: ORAL_TABLET | ORAL | Status: DC

## 2012-09-06 MED ORDER — PANTOPRAZOLE SODIUM 40 MG PO TBEC: 40.0000 mg | DELAYED_RELEASE_TABLET | Freq: Two times a day (BID) | ORAL | Status: DC

## 2012-09-06 MED ORDER — AZITHROMYCIN 250 MG PO TABS: ORAL_TABLET | ORAL | Status: DC

## 2012-09-06 NOTE — Patient Instructions (Addendum)
Today we updated your med list in our EPIC system...    Continue your current medications the same...  Please return to our lab in the AM for your FASTING blood work...    We will contact you w/ the results when available...   Keep up the good work w/ diet & exercise- work on further weight reduction...  Call for any questions...  Let's plan a follow up visit in 8mo, sooner if needed for problems.Marland KitchenMarland Kitchen

## 2012-09-06 NOTE — Progress Notes (Signed)
Subjective:    Patient ID: [name], male    DOB: [birth year], 67 y.o.   MRN: [id number]  HPI 67 y/o WM here for a follow up visit... he has multiple medical problems including:  HBP;  VV/ VI/ hx DVT;  Hyperlipidemia;  DM;  Obesity;  GERD/ Divertics/ Colon polyps/ Hems;  Gallstone;  BPH/ Bladder outlet obstruction;  Kidney stones;  DJD/ LBP;  Anxiety...  ~  October 05, 2010:  51mo ROV & [name] has been busy again in the interval- s/p left TKR by DrOlin 4/12 & s/p right TKR 7/12; by all accounts he did beautifully, no complic, went thru rehab & now into exercise program (off Hydrocodone, using Voltaren prn); weight down 9# and BS/A1c improved (on Metformin, Amaryl); he reports passing several small kidney stones as well & is taking Rapaflo from Urology...    HBP>  On Diovan 160mg tabs- 1/2 tab daily + low sodium/ wt reducing diet;  BP= 140/86 & similar at home, tol med well & denies CP/ palpit/ dizzy/ SOB/ incr edema etc...    Chol>  On Simva40 & FLP 4/12 showed TChol 125, TG 141, HDL 29, LDL 68;  Good control on med but needs better low fat diet & incr exercise...    DM>  On Metform500Bid + Glimep1mg  Qam;  FBS=118 & A1c=6.0 now> w/ his wt reduction & incr exerc program- we will rec decr Glimep1mg  to 1/2 tab Qam...    Obese>  His weight is down 9# to 257#, 70" tall, BMI= 36-7;  We have discussed diet & exercise w/ each office visit but he's had difficulty w/ compliance due to his arthritis & hopefully the TKRs will allow for more vigorous exercise program...    GI>  Hx GERD, Divertics, polyps> last colon 2010 by [name] was neg & f/u planned 80yrs;  He takes Prevacid 30mg /d, and Zantac 150mg  Qhs...    GU>  Long hx kidney stones followed by DrPeterson & Dahlstadt;  NOTE- he is very allergic to Cipro;  Labs show BUN=17, Creat=1.0;  [name] placed him on Rapaflo to aide stone passing.    DJD>  As above, s/p bilat TKRs, done w/ rehab & into his exercise program, using Voltaren as needed...  ~   November 23, 2011:  Yearly ROV & [name] had a recent URI- treated w/ ZPak, Mucinex, Hydromet & improving;  otherw reports a good yr, feeling well, no new complaints or concerns- just unable to lose the weight & we reviewed DIET, EXERCISE, wt reduction strategies...    HBP>  On Diovan 160mg tabs-1/2 tab daily + low sodium/ wt reducing diet;  BP= 126/78 & similar at home & denies CP/ palpit/ dizzy/ SOB/ incr edema etc...    Chol>  On Simva40 & FLP 11/13 showed TChol 129, TG 162, HDL 32, LDL 64;  Good control on med but needs better low fat diet & incr exercise...    DM>  On Metform500Bid + Glimep1mg -1/2;  FBS=164 & A1c=7.7 now> needs better diet, wt reduction & incr Glimep2mg  Qam...    Obese>  His weight is 259#, 70" tall, BMI= 36-7;  We have discussed diet & exercise w/ each office visit but he's had difficulty w/ compliance due to his arthritis & hopefully the TKRs will allow for more vigorous exercise program...    GI>  Hx GERD, Divertics, polyps> last colon 2010 by [name] was neg & f/u planned 72yrs;  He takes PROTONIX 40mg /d, and Zantac 150mg  Qhs...    GU>  Long hx kidney stones followed by DrPeterson & Dahlstadt;  NOTE- he is very allergic to Cipro;  Labs show BUN=17, Creat=1.0;  [name] placed him on Rapaflo to aide stone passing & he notes this really helps...    DJD, s/p bilat TKRs, LBP>  As above, s/p bilat TKRs, done w/ rehab & into his exercise program, using Voltaren as needed... We reviewed prob list, meds, xrays and labs> see below for updates >> he had Flu vaccine in Sept; wants refills for 90d supplies...  LABS 11/13:  FLP- ok on Simva40 but TG=162 needs better low fat diet; Chems- ok x BS=164 A1c=7.7 & sl incr LFTs;  CBC- wnl;  TSH=0.41;  PSA=6.04 & refer to Urol.   ~  September 06, 2012:  63mo ROV & [name] is basically stable after a difficult time after GB surg 3/14- mult issues as noted below; c/o URI cough, clear sput & wants ZPak- ok... We reviewed the following medical problems  during today's office visit >>     HBP>  On Diovan 160mg tabs-1/2 tab daily + low sodium/ wt reducing diet;  BP= 122/82 & similar at home & denies CP/ palpit/ dizzy/ SOB/ incr edema etc...    Chol>  On Simva40 & FLP 9/14 shows TChol 94, TG 93, HDL 29, LDL 46;  Good control on med but needs better low fat diet & incr exercise...    DM>  On Metform500Bid + Glimep2mg -1/2;  FBS=105 & A1c=6.7 now> needs better diet, wt reduction...    Obese>  His weight is down 10# to 249#, 70" tall, BMI= 36;  We have discussed diet & exercise w/ each office visit but he's had difficulty w/ compliance due to his arthritis & hopefully the TKRs will allow for more vigorous exercise program...    GI>  Hx GERD, Divertics, polyps> last colon 2010 by [name] was neg & f/u planned 75yrs;  He takes PROTONIX 40mg /d, and Zantac 150mg  Qhs...    S/P Lap Chole for chr cholecystitis & gallstones 3/14 by DrWakefield, [facility]- he had a diffiicult time after the surg w/ UTI, Urin retention, etc...     S/P GB surg 3/14 w/ 1 night in hosp & wife reports in detail- "it was a nightmare" w/ urinary retention, kidney infection, hypotonic bladder req self caths, Urecholine, now Rapaflo per DrTannenbaum...     GU>  Long hx kidney stones followed by DrPeterson & Dahlstadt;  NOTE- he is very allergic to Cipro;  Labs show BUN=19, Creat=1.1;  [name] placed him on Rapaflo to aide stone passing & he notes this really helps; Note: PSA=7.93 & we will forward to Urology.    DJD, s/p bilat TKRs, LBP>  As above, s/p bilat TKRs, done w/ rehab & into his exercise program, using Voltaren as needed... We reviewed prob list, meds, xrays and labs> see below for updates >> OK FLU vaccine today; refill meds for 30d supplies per request... LABS 9/14:  FLP- ok on Simva40 x HDL=29;  Chems- ok w/ BS=105 Cr=1.1;  CBC- wnl x MCV=77;  TSH=0.88;  PSA=7.93 & copy sent to DrTannenbaum...           Problem List:  ? of PULMONARY NODULE, LEFT LOWER LOBE (ICD-518.89) - CXR 3/10  showed ?nodular density on lat film & subseq CT Chest showed that this was an osteophyte, & also displayed some basilar fibrosis, 4mm RLL nodule, & 3cm right adrenal adenoma... ~  CXR 4/12 showed boderline heart size, clear lungs, large osteophytes in lower spine  appear stable... ~  CXR 2/14 showed stable heart size, clear lungs, degen changes in lower TSpine, NAD...  HYPERTENSION, BENIGN (ICD-401.1) - on ASA 81mg /d,  DIOVAN 160mg - 1/2 tab/d... ~  NuclearStressTest for surg clearance 1/09 was neg- ? soft tissue atten ant, no ischemia, EF= 57%... ~  EKG in ER 5/10 showed NSR, no ectopy, IVCD, LAD, no acute changes. ~  EKG 4/12 showed NSR, LAD, otherw wnl... ~  11/13:  BP= 126/78 & he remains asymptomatic...  ~  EKG 1/14 showed NSR, rate91, IRBBB, LAD, poor R progression... ~  1/14: he had Cardiac eval by DrHochrein for CP> ?reflux symtoms, dull burning in mid chest, worse after eating not after activ; he rec GXT=>   VARICOSE VEINS, LOWER EXTREMITIES (ICD-454.9) VENOUS INSUFFICIENCY (ICD-459.81) Hx of DEEP VEIN THROMBOSIS (ICD-453.8) - he has VV & VI as baseline> hx left leg DVT after neck surgery 2007 treated w/ coumadin; no recurrent prob after left leg fracture w/ surg 11/09 by Tyna Jaksch... ~  4/12:  Pre-op assessment for TKR> neg Ven Dopplers & neg hypercoagulable panel; OK for surg... ~  10/12:  He had left TKR 4/12 & right TKR 7/12; no complic, went thru rehab & now exercising on his own...  HYPERLIPIDEMIA (ICD-272.4) - on SIMVASTATIN 40mg /d... ~  FLP 2/09 on Simva40 showed TChol 109, TG 100, HDL 32, LDL 57... continue med, better diet, get wt down! ~  FLP 2/10 on Simva40 showed TChol 103, TG 69, HDL 34, LDL 56 ~  FLP 3/11 on Simva40 showed TChol 114, TG 105, HDL 38, LDL 55 ~  FLP 9/11 on Simva40 showed TChol 128, TG 134, HDL 30, LDL 72... needs incr exerc & get wt down. ~  FLP 4/12 on Simva40 showed TChol 128, TG 141, HDL 29, LDL 68 ~  FLP 11/13 on Simva40 showed TChol 129, TG 162, HDL 32,  LDL 64  DIABETES MELLITUS (ICD-250.00) - on METFORMIN 500mg Bid & GLIMEPIRIDE 1mg /d (added 9/10)... he has been unable to lose weight! ~  labs 7/08 showed BS=117, HgA1c=6.5.[name]. rec> same meds, better diet... ~  labs 2/09 showed BS=120... rec> same med, better diet and get weight down!! ~  labs 2/10 showed BS= 145, A1c= 7.1 ~  labs 9/10 showed BS= 199, A1c= 7.8.[name]. rec> added GLIMEPIRIDE 1mg /d. ~  labs 3/11 showed BS= 149, A1c= 6.8, weight = 268#, 70" tall, BMI= 38.5 ~  labs 9/11 showed BS= 135, A1c= 7.1 ~  Labs 4/12 showed BS= 102, A1c=7.2 ~  Labs 10/12 showed BS= 118, A1c= 6.0.Marland KitchenMarland Kitchen Post op TKRs, exercising, wt down 9#, REC to decr Glimep1mg - 1/2 tab Qam. ~  Labs 11/13 showed BS= 164, A1c= 7.7.Marland KitchenMarland Kitchen Rec to incr the Glimep to 2mg  Qam...  G E R D (ICD-530.81) >>  ~  he takes PROTONIX 40mg /d, and RANITADINE 150mg  Qhs...  ~  he had EGD by DrPatterson 1994 showing gastritis ?due to NSAIDs, Rx'd Zantac... ~  EGD 1/14 by [name] done for CP eval was WNL, no cause ident... ~  Abd Sonar 1/14 showed gallstones & lim exam due to bowel gas, incr hep echogenicity, ?bilat renal calculi  DIVERTICULOSIS OF COLON (ICD-562.10) HEMORRHOIDS (ICD-455.6) COLONIC POLYPS (ICD-211.3) ~  colonoscopy 5/01 by [name] showed sm polyps (hyperplastic), divertics, hems, & ileal inflamm- neg bx...  ~  colonoscopy 7/10 by [name]- divertics, hems, diminutive polyp in transverse colon w/ bx= polypoid mucosa, no adenoma, f/u planned 10y.   CHOLELITHIASIS (ICD-574.20) - gallstone noted on sonar 8/04... no symptoms- denies n/v, abd  pain, etc... NAFLD >>  ~  Labs 11/13 showed SGOT=39 and SGPT=55 compatible w/ Fatty Liver disease & he is asked to GET THE WEIGHT DOWN!!! 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 Hx PROSTATITIS,  ED,  PEYRONIES >> ELEVATED PSA Readings >>  ~  9/10: pt states sl incr PSA's followed by DrPeterson Q51mo- we don't have their lab data. ~  11/13:  PSA here = 6.04 & he is referred back to DrDahlstedt to consider prostate bx... ~  3/14: he had difficult time w/ Urinary retention after GB surg>  ~  6/14: note from DrTannenbaum> on Rapaflo for urinary retention & hypotonic bladder; treated for UTI, PVR down to zero... ~  Labs 9/14 showed PSA up to 7.93 & we will forward to Urology...  NEPHROLITHIASIS (ICD-592.0) - followed by DrPeterson> "I've had them ever since I was 16"... ~  CT Abd 7/09 by Urology showed bilat non-obstructing stones, incidental 2-3cm right adrenal adenoma, DJD Lspine. ~  2/10: states he passed a sm stone on his own... ~  7/11:  s/p cysto, ureteroscopy, laser frag & stent for yet another stone. ~  10/12:  He reports passing several sm stones over the last few mo, on Rapaflo per [name]... ~  11/13:  He tells me that he continues to pass stones on & off... ~  2014: he tells me he passed an additional 2 kidney stones during his post op GB period!  DEGENERATIVE JOINT DISEASE (ICD-715.90) - c/o bilat knee pain> he saw DrSupple for Ortho w/ shot in right knee 2010 (helped); shot didn't help on left- therefore left knee arthroscopy 8/11... ~  4/12:  S/p left TKR by DrOlin... ~  7/12:  S/p right TKR by DrOlin; he has been thru the PT & doing well, exercising 2d/wk at gym, on diet etc...  LOW BACK PAIN SYNDROME (ICD-724.2) - prev hx of T1-T2 surgery for HNP.[name]. recent eval by Susann Givens, DrTooke, DrWang... decided against surg and DrWang tried a shot to "burn" the nerve- "I'm 60% better"... still doing exercises and taking Celebrex- we decided to change to Mobic/ Tylenol Rx...  ANXIETY (ICD-300.00)   Past Medical History  Diagnosis Date  . Anxiety    . Low back pain syndrome   . DJD (degenerative joint disease)   . Nephrolithiasis   . BPH (benign prostatic hypertrophy) with urinary obstruction   . Cholelithiasis   . Hemorrhoids   . History of colonic polyps   . Diverticulosis of colon   . GERD (gastroesophageal reflux disease)   . Diabetes mellitus   . Hyperlipidemia   . History of DVT (deep vein thrombosis)   . Venous insufficiency   . Varicose veins of lower extremities   . Hypertension, benign   . pulmonary nodule, left lower lobe   . Clotting disorder   . History of kidney stones     Past Surgical History  Procedure Laterality Date  . Sebaceous cyst surgery    . Bilateral inguinal hernia repairs    . T1-t2 disc surgery  2007    by Dr. Otelia Sergeant  . Left ankle surgery with plate and screws  11/2007  by Dr. Lajoyce Corners  . Total knee arthroplasty      right and left  . Cervical disc arthroplasty    . Tonsillectomy    . Appendectomy    . Hernia repair      ventral hernia with mesh 1990s  . Joint replacement    . Cholecystectomy  03/07/2012  . Cholecystectomy N/A 03/07/2012    Procedure: LAPAROSCOPIC CHOLECYSTECTOMY;  Surgeon: Emelia Loron, MD;  Location: [facility] OR;  Service: General;  Laterality: N/A;    Outpatient Encounter Prescriptions as of 09/06/2012  Medication Sig Dispense Refill  . aspirin EC 81 MG tablet Take 81 mg by mouth daily.      . diclofenac (VOLTAREN) 75 MG EC tablet Take 75 mg by mouth 2 (two) times daily.      [name] glimepiride (AMARYL) 2 MG tablet Take 1 mg by mouth daily before breakfast.       . metFORMIN (GLUCOPHAGE) 500 MG tablet Take 1 tablet (500 mg total) by mouth 2 (two) times daily with a meal.  180 tablet  3  . multivitamin (THERAGRAN) per tablet Take 1 tablet by mouth daily.       . pantoprazole (PROTONIX) 40 MG tablet Take 1 tablet (40 mg total) by mouth 2 (two) times daily.  60 tablet  5  . ranitidine (ZANTAC) 150 MG capsule Take 150 mg by mouth at bedtime.       . silodosin (RAPAFLO) 4 MG CAPS  capsule Take 4 mg by mouth 2 (two) times daily.       . simvastatin (ZOCOR) 40 MG tablet Take 40 mg by mouth at bedtime.      . valsartan (DIOVAN) 160 MG tablet Take 80 mg by mouth daily.      . [DISCONTINUED] cetirizine (ZYRTEC) 10 MG tablet Take 10 mg by mouth daily.        No facility-administered encounter medications on file as of 09/06/2012.    Allergies  Allergen Reactions  . Ciprofloxacin     REACTION: rash  . Niacin     REACTION: hypertension, shaky    Current Medications, Allergies, Past Medical History, Past Surgical History, Family History, and Social History were reviewed in [employer] record.    Review of Systems        See HPI - all other systems neg except as noted... The patient complains of difficulty walking.  The patient denies anorexia, fever, weight loss, weight gain, vision loss, decreased hearing, hoarseness, chest pain, syncope, ch in dyspnea on exertion, peripheral edema, prolonged cough, headaches, hemoptysis, abdominal pain, melena, hematochezia, severe indigestion/heartburn, hematuria, incontinence, muscle weakness, suspicious skin lesions, transient blindness, depression, unusual weight change, abnormal bleeding, enlarged lymph nodes, and angioedema.     Objective:   Physical Exam     WD, Obese, 67 y/o WM in NAD... GENERAL:  Alert & oriented; pleasant & cooperative... HEENT:  /AT, EOM-wnl, PERRLA, EACs-clear, TMs-wnl, NOSE-clear, THROAT-clear & wnl. NECK:  Supple w/ fair ROM; no JVD; normal carotid impulses w/o bruits; no thyromegaly or nodules palpated; no lymphadenopathy. CHEST:  Clear to P & A; without wheezes/ rales/ or rhonchi. HEART:  Regular Rhythm; without murmurs/ rubs/ or gallops. ABDOMEN:  Obese, soft & nontender; normal bowel sounds; no organomegaly or masses detected. EXT: s/p bilat TKRs, mod arthritic changes & sl decr ROM knees; +varicose veins/ +venous insuffic/ tr edema. NEURO:  CN's intact; motor testing  normal; sensory testing normal; gait normal & balance OK. DERM:  no rash etc..[name]  RADIOLOGY DATA:  Reviewed in the EPIC EMR & discussed w/ the patient...  LABORATORY DATA:  Reviewed in the EPIC EMR & discussed w/ the patient...   Assessment & Plan:    HBP>  On Diovan 160mg tabs- 1/2 tab daily + low sodium/ wt reducing diet;  BP conjtrolled, continue same med + diet, exercise, wt reduction...     Chol>  On Simva40 & FLP looks good control on med but needs better low fat diet & incr exercise...     DM>  On Metform500Bid + Glimep w/ A1c at 6.7, needs better diet, wt reduction & continue meds...     Obese>  His weight is 249#, 70" tall, BMI= 36;  We have discussed diet & exercise w/ each office visit but he's had difficulty w/ compliance due to his arthritis & hopefully the TKRs will allow for more vigorous exercise program...     GI> Hx GERD, Divertics, polyps> last colon 2010 by [name] was neg & f/u planned 2yrs;  He takes Protonix 40mg /d, and Zantac 150mg  Qhs; s/p Lap Chole 3/14...     GU>  Long hx kidney stones followed by DrPeterson & Dahlstadt;  NOTE- he is very allergic to Cipro;  Labs show BUN=17, Creat=1.0;  He had a rough time w/ Urinary retetion & hypotonic bladder after surg; [name] placed him on Rapaflo to aide stone passing; PSA is up to 7.93 & needs f/u by Urology...     DJD>  As above, s/p bilat TKRs, done w/ rehab & into his exercise program, using Voltaren as needed...   Patient's Medications  New Prescriptions   AZITHROMYCIN (ZITHROMAX) 250 MG TABLET    Take as directed  Previous Medications   ASPIRIN EC 81 MG TABLET    Take 81 mg by mouth daily.   DICLOFENAC (VOLTAREN) 75 MG EC TABLET    Take 75 mg by mouth 2 (two) times daily.   MULTIVITAMIN (THERAGRAN) PER TABLET    Take 1 tablet by mouth daily.    RANITIDINE (ZANTAC) 150 MG CAPSULE    Take 150 mg by mouth at bedtime.    SILODOSIN (RAPAFLO) 4 MG CAPS CAPSULE    Take 4 mg by mouth 2 (two) times daily.     VALSARTAN (DIOVAN) 160 MG TABLET    Take 80 mg by mouth daily.  Modified Medications   Modified Medication Previous Medication   GLIMEPIRIDE (AMARYL) 2 MG TABLET glimepiride (AMARYL) 2 MG tablet      Take 1/2 tablet by mouth every morning    Take 1/2 tablet by mouth every morning   METFORMIN (GLUCOPHAGE) 500 MG TABLET metFORMIN (GLUCOPHAGE) 500 MG tablet      Take 1 tablet (500 mg total) by mouth 2 (two) times daily with a meal.    Take 1 tablet (500 mg total) by mouth 2 (two) times daily with a meal.   PANTOPRAZOLE (PROTONIX) 40 MG TABLET pantoprazole (PROTONIX) 40 MG tablet      Take 1 tablet (40 mg total) by mouth 2 (two) times daily.    Take 1 tablet (40 mg total) by mouth 2 (two) times daily.   SIMVASTATIN (ZOCOR) 40 MG TABLET simvastatin (ZOCOR) 40 MG tablet      Take 1 tablet (40 mg total) by mouth at bedtime.    Take 40 mg by mouth at bedtime.  Discontinued Medications   CETIRIZINE (ZYRTEC) 10 MG TABLET    Take 10 mg by mouth daily.

## 2012-09-07 ENCOUNTER — Other Ambulatory Visit (INDEPENDENT_AMBULATORY_CARE_PROVIDER_SITE_OTHER): Payer: Medicare Other

## 2012-09-07 DIAGNOSIS — F411 Generalized anxiety disorder: Secondary | ICD-10-CM | POA: Diagnosis not present

## 2012-09-07 DIAGNOSIS — E785 Hyperlipidemia, unspecified: Secondary | ICD-10-CM | POA: Diagnosis not present

## 2012-09-07 DIAGNOSIS — N401 Enlarged prostate with lower urinary tract symptoms: Secondary | ICD-10-CM

## 2012-09-07 DIAGNOSIS — K5730 Diverticulosis of large intestine without perforation or abscess without bleeding: Secondary | ICD-10-CM | POA: Diagnosis not present

## 2012-09-07 DIAGNOSIS — E119 Type 2 diabetes mellitus without complications: Secondary | ICD-10-CM

## 2012-09-07 DIAGNOSIS — I10 Essential (primary) hypertension: Secondary | ICD-10-CM

## 2012-09-07 LAB — BASIC METABOLIC PANEL
CO2: 27 mEq/L (ref 19–32)
Calcium: 9.1 mg/dL (ref 8.4–10.5)
GFR: 68.65 mL/min (ref >60.00)
Potassium: 4.8 mEq/L (ref 3.5–5.1)
Sodium: 141 mEq/L (ref 135–145)

## 2012-09-07 LAB — HEPATIC FUNCTION PANEL
Alkaline Phosphatase: 48 U/L (ref 39–117)
Bilirubin, Direct: 0.1 mg/dL (ref 0.0–0.3)

## 2012-09-07 LAB — CBC WITH DIFFERENTIAL/PLATELET
Basophils Absolute: 0 10*3/uL (ref 0.0–0.1)
Eosinophils Absolute: 0.4 10*3/uL (ref 0.0–0.7)
HCT: 43.5 % (ref 39.0–52.0)
Hemoglobin: 14.1 g/dL (ref 13.0–17.0)
Lymphs Abs: 3 10*3/uL (ref 0.7–4.0)
MCHC: 32.5 g/dL (ref 30.0–36.0)
Monocytes Absolute: 1 10*3/uL (ref 0.1–1.0)
Neutro Abs: 3.7 10*3/uL (ref 1.4–7.7)
RDW: 17.9 % — ABNORMAL HIGH (ref 11.5–14.6)

## 2012-09-07 LAB — LIPID PANEL
Cholesterol: 94 mg/dL (ref 0–200)
LDL Cholesterol: 46 mg/dL (ref 0–99)
Triglycerides: 93 mg/dL (ref 0.0–149.0)

## 2012-09-08 ENCOUNTER — Telehealth: Payer: Self-pay | Admitting: Pulmonary Disease

## 2012-09-08 NOTE — Telephone Encounter (Signed)
Spoke with pt and verified that Glimeperide dose is 2mg  1/2 tablet daily per Dr Kriste Basque.

## 2012-09-11 ENCOUNTER — Telehealth: Payer: Self-pay | Admitting: Pulmonary Disease

## 2012-09-11 NOTE — Telephone Encounter (Signed)
I spoke with pt. He stated [facility] advised him that they never received RX for ZPAK. I called [facility] and was advised they did not receive RX until 09/09/12. They will get this ready for pt. I called and made pt aware. Nothing further needed

## 2012-10-05 ENCOUNTER — Ambulatory Visit (INDEPENDENT_AMBULATORY_CARE_PROVIDER_SITE_OTHER): Payer: Medicare Other

## 2012-10-05 DIAGNOSIS — Z23 Encounter for immunization: Secondary | ICD-10-CM

## 2012-11-16 DIAGNOSIS — N401 Enlarged prostate with lower urinary tract symptoms: Secondary | ICD-10-CM | POA: Diagnosis not present

## 2012-11-16 DIAGNOSIS — N139 Obstructive and reflux uropathy, unspecified: Secondary | ICD-10-CM | POA: Diagnosis not present

## 2012-11-16 DIAGNOSIS — R972 Elevated prostate specific antigen [PSA]: Secondary | ICD-10-CM | POA: Diagnosis not present

## 2012-11-20 ENCOUNTER — Other Ambulatory Visit: Payer: Self-pay | Admitting: Pulmonary Disease

## 2012-11-20 DIAGNOSIS — R972 Elevated prostate specific antigen [PSA]: Secondary | ICD-10-CM | POA: Diagnosis not present

## 2012-11-23 ENCOUNTER — Ambulatory Visit: Payer: Medicare Other | Admitting: Pulmonary Disease

## 2012-12-08 ENCOUNTER — Other Ambulatory Visit: Payer: Self-pay | Admitting: Pulmonary Disease

## 2012-12-20 ENCOUNTER — Other Ambulatory Visit: Payer: Self-pay | Admitting: Pulmonary Disease

## 2012-12-26 ENCOUNTER — Encounter: Payer: Self-pay | Admitting: Pulmonary Disease

## 2012-12-26 ENCOUNTER — Other Ambulatory Visit (INDEPENDENT_AMBULATORY_CARE_PROVIDER_SITE_OTHER): Payer: Medicare Other

## 2012-12-26 ENCOUNTER — Ambulatory Visit (INDEPENDENT_AMBULATORY_CARE_PROVIDER_SITE_OTHER): Payer: Medicare Other | Admitting: Pulmonary Disease

## 2012-12-26 VITALS — BP 136/80 | HR 80 | Temp 97.8°F | Ht 70.0 in | Wt 258.6 lb

## 2012-12-26 DIAGNOSIS — I10 Essential (primary) hypertension: Secondary | ICD-10-CM

## 2012-12-26 DIAGNOSIS — E538 Deficiency of other specified B group vitamins: Secondary | ICD-10-CM

## 2012-12-26 DIAGNOSIS — K219 Gastro-esophageal reflux disease without esophagitis: Secondary | ICD-10-CM

## 2012-12-26 DIAGNOSIS — E119 Type 2 diabetes mellitus without complications: Secondary | ICD-10-CM

## 2012-12-26 DIAGNOSIS — E785 Hyperlipidemia, unspecified: Secondary | ICD-10-CM

## 2012-12-26 DIAGNOSIS — R718 Other abnormality of red blood cells: Secondary | ICD-10-CM

## 2012-12-26 DIAGNOSIS — K5730 Diverticulosis of large intestine without perforation or abscess without bleeding: Secondary | ICD-10-CM

## 2012-12-26 DIAGNOSIS — Z23 Encounter for immunization: Secondary | ICD-10-CM | POA: Diagnosis not present

## 2012-12-26 DIAGNOSIS — D126 Benign neoplasm of colon, unspecified: Secondary | ICD-10-CM

## 2012-12-26 LAB — CBC WITH DIFFERENTIAL/PLATELET
Basophils Relative: 0.3 % (ref 0.0–3.0)
Eosinophils Absolute: 0.3 10*3/uL (ref 0.0–0.7)
Eosinophils Relative: 3.2 % (ref 0.0–5.0)
HCT: 40.9 % (ref 39.0–52.0)
Hemoglobin: 13.2 g/dL (ref 13.0–17.0)
Lymphs Abs: 2.3 10*3/uL (ref 0.7–4.0)
MCHC: 32.3 g/dL (ref 30.0–36.0)
MCV: 76.9 fl — ABNORMAL LOW (ref 78.0–100.0)
Monocytes Absolute: 1 10*3/uL (ref 0.1–1.0)
Neutro Abs: 5.9 10*3/uL (ref 1.4–7.7)
RBC: 5.31 Mil/uL (ref 4.22–5.81)
RDW: 17.2 % — ABNORMAL HIGH (ref 11.5–14.6)
WBC: 9.6 10*3/uL (ref 4.5–10.5)

## 2012-12-26 LAB — BASIC METABOLIC PANEL
Chloride: 107 mEq/L (ref 96–112)
Potassium: 4.6 mEq/L (ref 3.5–5.1)
Sodium: 140 mEq/L (ref 135–145)

## 2012-12-26 LAB — VITAMIN B12: Vitamin B-12: 465 pg/mL (ref 211–911)

## 2012-12-26 LAB — IBC PANEL: Saturation Ratios: 7.7 % — ABNORMAL LOW (ref 20.0–50.0)

## 2012-12-26 NOTE — Patient Instructions (Signed)
Today we updated your med list in our EPIC system...    Continue your current medications the same...  tday we rechecked your blood work as discussed...    We will contact you w/ the results when available...   We gave you the PNEUMOVAX today...  Let's get on track w/ our diet & exercise program...    The goal is to lose 15-20 lbs...  Call for any questions...  Let's plan a follow up visit in 37mo, sooner if needed for problems.Marland KitchenMarland Kitchen

## 2012-12-26 NOTE — Progress Notes (Signed)
Subjective:    Patient ID: [name], male    DOB: [birth year], 67 y.o.   MRN: [id number]  HPI 67 y/o WM here for a follow up visit... he has multiple medical problems including:  HBP;  VV/ VI/ hx DVT;  Hyperlipidemia;  DM;  Obesity;  GERD/ Divertics/ Colon polyps/ Hems;  Gallstone;  BPH/ Bladder outlet obstruction;  Kidney stones;  DJD/ LBP;  Anxiety...  ~  November 23, 2011:  Yearly ROV & [name] had a recent URI- treated w/ ZPak, Mucinex, Hydromet & improving;  otherw reports a good yr, feeling well, no new complaints or concerns- just unable to lose the weight & we reviewed DIET, EXERCISE, wt reduction strategies...    HBP>  On Diovan 160mg tabs-1/2 tab daily + low sodium/ wt reducing diet;  BP= 126/78 & similar at home & denies CP/ palpit/ dizzy/ SOB/ incr edema etc...    Chol>  On Simva40 & FLP 11/13 showed TChol 129, TG 162, HDL 32, LDL 64;  Good control on med but needs better low fat diet & incr exercise...    DM>  On Metform500Bid + Glimep1mg -1/2;  FBS=164 & A1c=7.7 now> needs better diet, wt reduction & incr Glimep2mg  Qam...    Obese>  His weight is 259#, 70" tall, BMI= 36-7;  We have discussed diet & exercise w/ each office visit but he's had difficulty w/ compliance due to his arthritis & hopefully the TKRs will allow for more vigorous exercise program...    GI>  Hx GERD, Divertics, polyps> last colon 2010 by [name] was neg & f/u planned 51yrs;  He takes PROTONIX 40mg /d, and Zantac 150mg  Qhs...    GU>  Long hx kidney stones followed by DrPeterson & Dahlstadt;  NOTE- he is very allergic to Cipro;  Labs show BUN=17, Creat=1.0;  [name] placed him on Rapaflo to aide stone passing & he notes this really helps...    DJD, s/p bilat TKRs, LBP>  As above, s/p bilat TKRs, done w/ rehab & into his exercise program, using Voltaren as needed... We reviewed prob list, meds, xrays and labs> see below for updates >> he had Flu vaccine in Sept; wants refills for 90d supplies...  LABS 11/13:  FLP-  ok on Simva40 but TG=162 needs better low fat diet; Chems- ok x BS=164 A1c=7.7 & sl incr LFTs;  CBC- wnl;  TSH=0.41;  PSA=6.04 & refer to Urol.   ~  September 06, 2012:  65mo ROV & [name] is basically stable after a difficult time after GB surg 3/14- mult issues as noted below; c/o URI cough, clear sput & wants ZPak- ok... We reviewed the following medical problems during today's office visit >>     HBP>  On Diovan 160mg tabs-1/2 tab daily + low sodium/ wt reducing diet;  BP= 122/82 & similar at home & denies CP/ palpit/ dizzy/ SOB/ incr edema etc...    Chol>  On Simva40 & FLP 9/14 shows TChol 94, TG 93, HDL 29, LDL 46;  Good control on med but needs better low fat diet & incr exercise...    DM>  On Metform500Bid + Glimep2mg -1/2;  FBS=105 & A1c=6.7 now> needs better diet, wt reduction...    Obese>  His weight is down 10# to 249#, 70" tall, BMI= 36;  We have discussed diet & exercise w/ each office visit but he's had difficulty w/ compliance due to his arthritis & hopefully the TKRs will allow for more vigorous exercise program...    GI>  Hx GERD,  Divertics, polyps> last colon 2010 by [name] was neg & f/u planned 63yrs;  He takes PROTONIX 40mg /d, and Zantac 150mg  Qhs...    S/P GB surg 3/14 w/ 1 night in hosp & wife reports in detail- "it was a nightmare" w/ urinary retention, kidney infection, hypotonic bladder req self caths, Urecholine, now Rapaflo per DrTannenbaum...     GU>  Long hx kidney stones followed by DrPeterson & Dahlstadt;  NOTE- he is very allergic to Cipro;  Labs show BUN=19, Creat=1.1;  [name] placed him on Rapaflo to aide stone passing & he notes this really helps; Note: PSA=7.93 & we will forward to Urology.    DJD, s/p bilat TKRs, LBP>  As above, s/p bilat TKRs, done w/ rehab & into his exercise program, using Voltaren as needed... We reviewed prob list, meds, xrays and labs> see below for updates >> OK FLU vaccine today; refill meds for 30d supplies per request... LABS 9/14:  FLP-  ok on Simva40 x HDL=29;  Chems- ok w/ BS=105 Cr=1.1;  CBC- wnl x MCV=77;  TSH=0.88;  PSA=7.93 & copy sent to DrTannenbaum...  ~  December 26, 2012:  21mo ROV & [name] is improved- no new complaints or concerns; here for fasting labs (see below)...    BP is controlled on Diovan160-1/2 tab daily; BP= 136/80 & he denies CP, palpit, SOB, edema, etc...    Chol is well controlled on diet + Simva40; encouraged to lose weight...     DM is stable on Metform500Bid plus Glimep2mg -1/2 tab Qam; Labs today showed BS= 132, A1c= 6.9.Marland KitchenMarland Kitchen    GI & GU are improved over time & approaching his baseline... We reviewed prob list, meds, xrays and labs> see below for updates >>  LABS 12/14:  Chems- ok w/ BS=132, A1c=6.9;  CBC- wnl w/ Hg=13.2 but MCV=77 ans Fe=30 (8%sat);  B12 level = 465... REC to check stool cards and start FeSO4 325mg  daily w/ VitC...            Problem List:  ? of PULMONARY NODULE, LEFT LOWER LOBE (ICD-518.89) - CXR 3/10 showed ?nodular density on lat film & subseq CT Chest showed that this was an osteophyte, & also displayed some basilar fibrosis, 4mm RLL nodule, & 3cm right adrenal adenoma... ~  CXR 4/12 showed boderline heart size, clear lungs, large osteophytes in lower spine appear stable... ~  CXR 2/14 showed stable heart size, clear lungs, degen changes in lower TSpine, NAD...  HYPERTENSION, BENIGN (ICD-401.1) - on ASA 81mg /d,  DIOVAN 160mg - 1/2 tab/d... ~  NuclearStressTest for surg clearance 1/09 was neg- ? soft tissue atten ant, no ischemia, EF= 57%... ~  EKG in ER 5/10 showed NSR, no ectopy, IVCD, LAD, no acute changes. ~  EKG 4/12 showed NSR, LAD, otherw wnl... ~  11/13:  BP= 126/78 & he remains asymptomatic...  ~  EKG 1/14 showed NSR, rate91, IRBBB, LAD, poor R progression... ~  1/14: he had Cardiac eval by DrHochrein for CP> ?reflux symtoms, dull burning in mid chest, worse after eating not after activ; he rec GXT=> done 2/14> poor to moderate exercise tolerance, no chest pain,  appropriate level of dyspnea, PVCs noted at peak exercise, no ischemic ST T wave changes and a normal heart rate recovery. ~  9/14: On Diovan 160mg tabs-1/2 tab daily + low sodium/ wt reducing diet;  BP= 122/82 & similar at home & denies CP/ palpit/ dizzy/ SOB/ incr edema etc...  VARICOSE VEINS, LOWER EXTREMITIES (ICD-454.9) VENOUS INSUFFICIENCY (ICD-459.81) Hx of DEEP VEIN  THROMBOSIS (ICD-453.8) - he has VV & VI as baseline> hx left leg DVT after neck surgery 2007 treated w/ coumadin; no recurrent prob after left leg fracture w/ surg 11/09 by DrDuda... ~  4/12:  Pre-op assessment for TKR> neg Ven Dopplers & neg hypercoagulable panel; OK for surg... ~  10/12:  He had left TKR 4/12 & right TKR 7/12; no complic, went thru rehab & now exercising on his own...  HYPERLIPIDEMIA (ICD-272.4) - on SIMVASTATIN 40mg /d... ~  FLP 2/09 on Simva40 showed TChol 109, TG 100, HDL 32, LDL 57... continue med, better diet, get wt down! ~  FLP 2/10 on Simva40 showed TChol 103, TG 69, HDL 34, LDL 56 ~  FLP 3/11 on Simva40 showed TChol 114, TG 105, HDL 38, LDL 55 ~  FLP 9/11 on Simva40 showed TChol 128, TG 134, HDL 30, LDL 72... needs incr exerc & get wt down. ~  FLP 4/12 on Simva40 showed TChol 128, TG 141, HDL 29, LDL 68 ~  FLP 11/13 on Simva40 showed TChol 129, TG 162, HDL 32, LDL 64 ~  FLP 9/14 on Simva40 shows TChol 94, TG 93, HDL 29, LDL 46  DIABETES MELLITUS (ICD-250.00) - on METFORMIN 500mg Bid & GLIMEPIRIDE 1mg /d (added 9/10)... he has been unable to lose weight! ~  labs 7/08 showed BS=117, HgA1c=6.5.[name]. rec> same meds, better diet... ~  labs 2/09 showed BS=120... rec> same med, better diet and get weight down!! ~  labs 2/10 showed BS= 145, A1c= 7.1 ~  labs 9/10 showed BS= 199, A1c= 7.8.[name]. rec> added GLIMEPIRIDE 1mg /d. ~  labs 3/11 showed BS= 149, A1c= 6.8, weight = 268#, 70" tall, BMI= 38.5 ~  labs 9/11 showed BS= 135, A1c= 7.1 ~  Labs 4/12 showed BS= 102, A1c=7.2 ~  Labs 10/12 showed BS= 118, A1c= 6.0.Marland KitchenMarland Kitchen  Post op TKRs, exercising, wt down 9#, REC to decr Glimep1mg - 1/2 tab Qam. ~  Labs 11/13 showed BS= 164, A1c= 7.7.Marland KitchenMarland Kitchen Rec to incr the Glimep to 2mg  Qam (later cut back again)... ~  Labs 9/14 on Metform500Bid & Glim2-1/2 showed FBS=105 & A1c=6.7  ~  Labs 12/14 on Metform500Bid & Glim2-1/2 showed BS=132, A1c=6.9  OBESITY >> he has been unable to lose weight on diet, exercise, etc... ~  Weight 12/14 = 259#   G E R D (ICD-530.81) >>  ~  he takes PROTONIX 40mg /d, and RANITADINE 150mg  Qhs...  ~  he had EGD by DrPatterson 1994 showing gastritis ?due to NSAIDs, Rx'd Zantac... ~  EGD 1/14 by [name] done for CP eval was WNL, no cause ident... ~  Abd Sonar 1/14 showed gallstones & lim exam due to bowel gas, incr hep echogenicity, ?bilat renal calculi  DIVERTICULOSIS OF COLON (ICD-562.10) HEMORRHOIDS (ICD-455.6) COLONIC POLYPS (ICD-211.3) ~  colonoscopy 5/01 by [name] showed sm polyps (hyperplastic), divertics, hems, & ileal inflamm- neg bx...  ~  colonoscopy 7/10 by [name]- divertics, hems, diminutive polyp in transverse colon w/ bx= polypoid mucosa, no adenoma, f/u planned 10y.   CHOLELITHIASIS (ICD-574.20) - gallstone noted on sonar 8/04... no symptoms- denies n/v, abd pain, etc... NAFLD >>  ~  Labs 11/13 showed SGOT=39 and SGPT=55 compatible w/ Fatty Liver disease & he is asked to GET THE WEIGHT DOWN!!! ~  1/14: eval by [name] w/ EGD & Abd Sonar=> gallstones & lim exam due to bowel gas, incr hep echogenicity, ?bilat renal calculi ~  3/14: S/P Lap Chole for chr cholecystitis & gallstones 3/14 by DrWakefield, [facility]- he had a diffiicult time after  the surg w/ UTI, Urin retention, etc  BENIGN PROSTATIC HYPERTROPHY, WITH OBSTRUCTION (ICD-600.01) - neg biopsies 10/06 by DrDahlstedt... Hx PROSTATITIS,  ED,  PEYRONIES >> ELEVATED PSA Readings >>  ~  9/10: pt states sl incr PSA's followed by DrPeterson Q45mo- we don't have their lab data. ~  11/13:  PSA here = 6.04 & he is referred back to DrDahlstedt to  consider prostate bx... ~  3/14: he had difficult time w/ Urinary retention after GB surg>  ~  6/14: note from DrTannenbaum> on Rapaflo for urinary retention & hypotonic bladder; treated for UTI, PVR down to zero... ~  Labs 9/14 showed PSA up to 7.93 & we will forward to Urology...  NEPHROLITHIASIS (ICD-592.0) - followed by DrPeterson> "I've had them ever since I was 16"... ~  CT Abd 7/09 by Urology showed bilat non-obstructing stones, incidental 2-3cm right adrenal adenoma, DJD Lspine. ~  2/10: states he passed a sm stone on his own... ~  7/11:  s/p cysto, ureteroscopy, laser frag & stent for yet another stone. ~  10/12:  He reports passing several sm stones over the last few mo, on Rapaflo per [name]... ~  11/13:  He tells me that he continues to pass stones on & off... ~  2014: he tells me he passed an additional 2 kidney stones during his post op GB period!  DEGENERATIVE JOINT DISEASE (ICD-715.90) - c/o bilat knee pain> he saw DrSupple for Ortho w/ shot in right knee 2010 (helped); shot didn't help on left- therefore left knee arthroscopy 8/11... ~  4/12:  S/p left TKR by DrOlin... ~  7/12:  S/p right TKR by DrOlin; he has been thru the PT & doing well, exercising 2d/wk at gym, on diet etc...  LOW BACK PAIN SYNDROME (ICD-724.2) - prev hx of T1-T2 surgery for HNP.[name]. recent eval by Susann Givens, DrTooke, DrWang... decided against surg and DrWang tried a shot to "burn" the nerve- "I'm 60% better"... still doing exercises and taking Celebrex- we decided to change to Mobic/ Tylenol Rx...  ANXIETY (ICD-300.00)   Past Medical History  Diagnosis Date  . Anxiety   . Low back pain syndrome   . DJD (degenerative joint disease)   . Nephrolithiasis   . BPH (benign prostatic hypertrophy) with urinary obstruction   . Cholelithiasis   . Hemorrhoids   . History of colonic polyps   . Diverticulosis of colon   . GERD (gastroesophageal reflux disease)   . Diabetes mellitus   . Hyperlipidemia   .  History of DVT (deep vein thrombosis)   . Venous insufficiency   . Varicose veins of lower extremities   . Hypertension, benign   . pulmonary nodule, left lower lobe   . Clotting disorder   . History of kidney stones     Past Surgical History  Procedure Laterality Date  . Sebaceous cyst surgery    . Bilateral inguinal hernia repairs    . T1-t2 disc surgery  2007    by Dr. Otelia Sergeant  . Left ankle surgery with plate and screws  11/2007    by Dr. Lajoyce Corners  . Total knee arthroplasty      right and left  . Cervical disc arthroplasty    . Tonsillectomy    . Appendectomy    . Hernia repair      ventral hernia with mesh 1990s  . Joint replacement    . Cholecystectomy  03/07/2012  . Cholecystectomy N/A 03/07/2012    Procedure: LAPAROSCOPIC CHOLECYSTECTOMY;  Surgeon:  Emelia Loron, MD;  Location: [facility] OR;  Service: General;  Laterality: N/A;    Outpatient Encounter Prescriptions as of 12/26/2012  Medication Sig  . aspirin EC 81 MG tablet Take 81 mg by mouth daily.  . diclofenac (VOLTAREN) 75 MG EC tablet TAKE 1 TABLET BY MOUTH TWICE A DAY WITH A MEAL  . glimepiride (AMARYL) 2 MG tablet Take 1/2 tablet by mouth every morning  . metFORMIN (GLUCOPHAGE) 500 MG tablet Take 1 tablet (500 mg total) by mouth 2 (two) times daily with a meal.  . multivitamin (THERAGRAN) per tablet Take 1 tablet by mouth daily.   . pantoprazole (PROTONIX) 40 MG tablet Take 1 tablet (40 mg total) by mouth 2 (two) times daily.  . ranitidine (ZANTAC) 150 MG capsule Take 150 mg by mouth at bedtime.   . silodosin (RAPAFLO) 4 MG CAPS capsule Take 4 mg by mouth 2 (two) times daily.   . simvastatin (ZOCOR) 40 MG tablet Take 1 tablet (40 mg total) by mouth at bedtime.  . valsartan (DIOVAN) 160 MG tablet TAKE 1/2 TABLET BY MOUTH EVERY DAY  . [DISCONTINUED] azithromycin (ZITHROMAX) 250 MG tablet Take as directed  . [DISCONTINUED] glimepiride (AMARYL) 2 MG tablet TAKE 1 TABLET (2 MG TOTAL) BY MOUTH DAILY BEFORE BREAKFAST.     Allergies  Allergen Reactions  . Ciprofloxacin     REACTION: rash  . Niacin     REACTION: hypertension, shaky    Current Medications, Allergies, Past Medical History, Past Surgical History, Family History, and Social History were reviewed in [employer] record.    Review of Systems        See HPI - all other systems neg except as noted... The patient complains of difficulty walking.  The patient denies anorexia, fever, weight loss, weight gain, vision loss, decreased hearing, hoarseness, chest pain, syncope, ch in dyspnea on exertion, peripheral edema, prolonged cough, headaches, hemoptysis, abdominal pain, melena, hematochezia, severe indigestion/heartburn, hematuria, incontinence, muscle weakness, suspicious skin lesions, transient blindness, depression, unusual weight change, abnormal bleeding, enlarged lymph nodes, and angioedema.     Objective:   Physical Exam     WD, Obese, 67 y/o WM in NAD... GENERAL:  Alert & oriented; pleasant & cooperative... HEENT:  [place]/AT, EOM-wnl, PERRLA, EACs-clear, TMs-wnl, NOSE-clear, THROAT-clear & wnl. NECK:  Supple w/ fair ROM; no JVD; normal carotid impulses w/o bruits; no thyromegaly or nodules palpated; no lymphadenopathy. CHEST:  Clear to P & A; without wheezes/ rales/ or rhonchi. HEART:  Regular Rhythm; without murmurs/ rubs/ or gallops. ABDOMEN:  Obese, soft & nontender; normal bowel sounds; no organomegaly or masses detected. EXT: s/p bilat TKRs, mod arthritic changes & sl decr ROM knees; +varicose veins/ +venous insuffic/ tr edema. NEURO:  CN's intact; motor testing normal; sensory testing normal; gait normal & balance OK. DERM:  no rash etc...  RADIOLOGY DATA:  Reviewed in the EPIC EMR & discussed w/ the patient...  LABORATORY DATA:  Reviewed in the EPIC EMR & discussed w/ the patient...   Assessment & Plan:    HBP>  On Diovan 160mg tabs- 1/2 tab daily + low sodium/ wt reducing diet;  BP conjtrolled,  continue same med + diet, exercise, wt reduction...     Chol>  On Simva40 & FLP looks good control on med but needs better low fat diet & incr exercise...     DM>  On Metform500Bid + Glimep w/ A1c at 6.7-6.9, needs better diet, wt reduction & continue meds...     Obese>  His weight is 259#, 70" tall, BMI= 36;  We have discussed diet & exercise w/ each office visit but he's had difficulty w/ compliance due to his arthritis & hopefully the TKRs will allow for more vigorous exercise program...     GI> Hx GERD, Divertics, polyps> last colon 2010 by [name] was neg & f/u planned 67yrs;  He takes Protonix 40mg /d, and Zantac 150mg  Qhs; s/p Lap Chole 3/14...     GU>  Long hx kidney stones followed by DrPeterson & Dahlstadt;  NOTE- he is very allergic to Cipro;  Labs show BUN=17, Creat=1.0;  He had a rough time w/ Urinary retetion & hypotonic bladder after surg; [name] placed him on Rapaflo to aide stone passing; PSA is up to 7.93 & needs f/u by Urology...     DJD>  As above, s/p bilat TKRs, done w/ rehab & into his exercise program, using Voltaren as needed...   Patient's Medications  New Prescriptions   No medications on file  Previous Medications   ASPIRIN EC 81 MG TABLET    Take 81 mg by mouth daily.   DICLOFENAC (VOLTAREN) 75 MG EC TABLET    TAKE 1 TABLET BY MOUTH TWICE A DAY WITH A MEAL   FERROUS SULFATE 325 (65 FE) MG TABLET    Take 325 mg by mouth daily with breakfast.   GLIMEPIRIDE (AMARYL) 2 MG TABLET    Take 1/2 tablet by mouth every morning   METFORMIN (GLUCOPHAGE) 500 MG TABLET    Take 1 tablet (500 mg total) by mouth 2 (two) times daily with a meal.   MULTIVITAMIN (THERAGRAN) PER TABLET    Take 1 tablet by mouth daily.    PANTOPRAZOLE (PROTONIX) 40 MG TABLET    Take 1 tablet (40 mg total) by mouth 2 (two) times daily.   RANITIDINE (ZANTAC) 150 MG CAPSULE    Take 150 mg by mouth at bedtime.    SILODOSIN (RAPAFLO) 4 MG CAPS CAPSULE    Take 4 mg by mouth 2 (two) times daily.     SIMVASTATIN (ZOCOR) 40 MG TABLET    Take 1 tablet (40 mg total) by mouth at bedtime.   VALSARTAN (DIOVAN) 160 MG TABLET    TAKE 1/2 TABLET BY MOUTH EVERY DAY   VITAMIN C (ASCORBIC ACID) 500 MG TABLET    Take 500 mg by mouth daily.  Modified Medications   No medications on file  Discontinued Medications   AZITHROMYCIN (ZITHROMAX) 250 MG TABLET    Take as directed   GLIMEPIRIDE (AMARYL) 2 MG TABLET    TAKE 1 TABLET (2 MG TOTAL) BY MOUTH DAILY BEFORE BREAKFAST.

## 2012-12-29 LAB — HEMOGLOBIN A1C: Hgb A1c MFr Bld: 6.9 % — ABNORMAL HIGH (ref 4.6–6.5)

## 2013-01-02 ENCOUNTER — Other Ambulatory Visit: Payer: Self-pay | Admitting: Pulmonary Disease

## 2013-01-02 DIAGNOSIS — R718 Other abnormality of red blood cells: Secondary | ICD-10-CM

## 2013-01-18 ENCOUNTER — Telehealth: Payer: Self-pay | Admitting: Pulmonary Disease

## 2013-01-18 NOTE — Telephone Encounter (Signed)
Called and spoke with pt and he stated that he did get the iron tablet that is equivalent to the 325 mg iron tablet.  He could not remember the vit c 500 but he will go and get this today.  Pt is aware to take these meds together.

## 2013-02-19 ENCOUNTER — Telehealth: Payer: Self-pay | Admitting: Pulmonary Disease

## 2013-02-19 NOTE — Telephone Encounter (Signed)
Spoke w/ pt. He reports he called to see what is covered and was giving 3 options: Benicar, losartan, irbersartan Please advise SN thanks  Allergies  Allergen Reactions  . Ciprofloxacin     REACTION: rash  . Niacin     REACTION: hypertension, shaky

## 2013-02-21 MED ORDER — LOSARTAN POTASSIUM 100 MG PO TABS: 100.0000 mg | ORAL_TABLET | Freq: Every day | ORAL | Status: DC

## 2013-02-21 NOTE — Telephone Encounter (Signed)
Spoke with the pt and notified of recs per SN  He verbalized understanding  Rx was sent to pharm  

## 2013-02-21 NOTE — Telephone Encounter (Signed)
Per SN---  Ok to change to losartan 100 mg  1 daily.

## 2013-03-09 ENCOUNTER — Ambulatory Visit: Payer: Medicare Other | Admitting: Pulmonary Disease

## 2013-03-23 ENCOUNTER — Other Ambulatory Visit (INDEPENDENT_AMBULATORY_CARE_PROVIDER_SITE_OTHER): Payer: Medicare Other

## 2013-03-23 ENCOUNTER — Other Ambulatory Visit: Payer: Self-pay | Admitting: Pulmonary Disease

## 2013-03-23 DIAGNOSIS — R718 Other abnormality of red blood cells: Secondary | ICD-10-CM | POA: Diagnosis not present

## 2013-03-23 LAB — IBC PANEL
Iron: 77 ug/dL (ref 42–165)
Saturation Ratios: 20.4 % (ref 20.0–50.0)
Transferrin: 269.4 mg/dL (ref 212.0–360.0)

## 2013-03-23 LAB — CBC WITH DIFFERENTIAL/PLATELET
BASOS PCT: 0.2 % (ref 0.0–3.0)
Basophils Absolute: 0 10*3/uL (ref 0.0–0.1)
EOS PCT: 3.1 % (ref 0.0–5.0)
Eosinophils Absolute: 0.3 10*3/uL (ref 0.0–0.7)
HEMATOCRIT: 46.8 % (ref 39.0–52.0)
Hemoglobin: 15.3 g/dL (ref 13.0–17.0)
LYMPHS ABS: 2.4 10*3/uL (ref 0.7–4.0)
Lymphocytes Relative: 25.7 % (ref 12.0–46.0)
MCHC: 32.6 g/dL (ref 30.0–36.0)
MCV: 81.8 fl (ref 78.0–100.0)
MONO ABS: 1 10*3/uL (ref 0.1–1.0)
Monocytes Relative: 10.7 % (ref 3.0–12.0)
Neutro Abs: 5.6 10*3/uL (ref 1.4–7.7)
Neutrophils Relative %: 60.3 % (ref 43.0–77.0)
Platelets: 291 10*3/uL (ref 150.0–400.0)
RBC: 5.72 Mil/uL (ref 4.22–5.81)
RDW: 19.2 % — ABNORMAL HIGH (ref 11.5–14.6)
WBC: 9.2 10*3/uL (ref 4.5–10.5)

## 2013-03-23 LAB — FOLATE: Folate: >24.8 ng/mL (ref >5.9)

## 2013-04-25 ENCOUNTER — Other Ambulatory Visit: Payer: Self-pay | Admitting: Pulmonary Disease

## 2013-05-07 LAB — HM DIABETES EYE EXAM

## 2013-05-31 DIAGNOSIS — H2510 Age-related nuclear cataract, unspecified eye: Secondary | ICD-10-CM | POA: Diagnosis not present

## 2013-05-31 DIAGNOSIS — E119 Type 2 diabetes mellitus without complications: Secondary | ICD-10-CM | POA: Diagnosis not present

## 2013-07-04 ENCOUNTER — Ambulatory Visit: Payer: Medicare Other | Admitting: Pulmonary Disease

## 2013-07-11 DIAGNOSIS — R972 Elevated prostate specific antigen [PSA]: Secondary | ICD-10-CM | POA: Diagnosis not present

## 2013-07-18 DIAGNOSIS — N312 Flaccid neuropathic bladder, not elsewhere classified: Secondary | ICD-10-CM | POA: Diagnosis not present

## 2013-07-18 DIAGNOSIS — N3000 Acute cystitis without hematuria: Secondary | ICD-10-CM | POA: Diagnosis not present

## 2013-07-25 ENCOUNTER — Other Ambulatory Visit: Payer: Self-pay | Admitting: Pulmonary Disease

## 2013-07-27 ENCOUNTER — Other Ambulatory Visit: Payer: Self-pay | Admitting: Pulmonary Disease

## 2013-08-07 ENCOUNTER — Telehealth: Payer: Self-pay

## 2013-08-07 NOTE — Telephone Encounter (Signed)
New Patient Previous PCP- Dr. Lenna Gilford  Left message for call back Non-identifiable   Flu-DUE Td-11/22/08 PNA- 12/26/12 Zoster- DUE CCS- 07/23/08- benign polyps & moderate diverticulosis; repeat in 10 years (07/2018); Dr. [name] PSA- 07/11/13- 5.33 Foot exam-DUE Eye exam-DUE Urinemicroalbumin-DUE Hemoglobin A1C-Due

## 2013-08-07 NOTE — Telephone Encounter (Signed)
Medication List and allergies:  Reviewed and updated  90 day supply/mail order: n/a Local prescriptions:  [id number] - [place], [place]  Immunization due:  Zoster   A/P: Personal, family and PSH: No change Flu-DUE  Td-11/22/08  PNA- 12/26/12  Zoster- DUE  CCS- 07/23/08- benign polyps & moderate diverticulosis; repeat in 10 years (07/2018); Dr. [name]  PSA- 07/11/13- 5.33  Foot exam-DUE  Eye exam- normal--received 2-3 months ago per patient; Dr. Delman Cheadle Urine microalbumin-DUE  Hemoglobin A1C-DUE   To discuss with provider: Nothing at this time.

## 2013-08-08 ENCOUNTER — Encounter: Payer: Self-pay | Admitting: Family Medicine

## 2013-08-08 ENCOUNTER — Other Ambulatory Visit: Payer: Self-pay | Admitting: General Practice

## 2013-08-08 ENCOUNTER — Ambulatory Visit (INDEPENDENT_AMBULATORY_CARE_PROVIDER_SITE_OTHER): Payer: Medicare Other | Admitting: Family Medicine

## 2013-08-08 VITALS — BP 120/76 | HR 93 | Temp 97.8°F | Resp 16 | Ht 69.75 in | Wt 253.1 lb

## 2013-08-08 DIAGNOSIS — E119 Type 2 diabetes mellitus without complications: Secondary | ICD-10-CM | POA: Diagnosis not present

## 2013-08-08 DIAGNOSIS — I82890 Acute embolism and thrombosis of other specified veins: Secondary | ICD-10-CM

## 2013-08-08 DIAGNOSIS — I10 Essential (primary) hypertension: Secondary | ICD-10-CM | POA: Diagnosis not present

## 2013-08-08 DIAGNOSIS — E785 Hyperlipidemia, unspecified: Secondary | ICD-10-CM

## 2013-08-08 DIAGNOSIS — R972 Elevated prostate specific antigen [PSA]: Secondary | ICD-10-CM | POA: Diagnosis not present

## 2013-08-08 MED ORDER — LOSARTAN POTASSIUM 100 MG PO TABS: 100.0000 mg | ORAL_TABLET | Freq: Every day | ORAL | Status: DC

## 2013-08-08 MED ORDER — GLIMEPIRIDE 2 MG PO TABS: ORAL_TABLET | ORAL | Status: DC

## 2013-08-08 MED ORDER — PANTOPRAZOLE SODIUM 40 MG PO TBEC: DELAYED_RELEASE_TABLET | ORAL | Status: DC

## 2013-08-08 MED ORDER — DICLOFENAC SODIUM 75 MG PO TBEC: DELAYED_RELEASE_TABLET | ORAL | Status: DC

## 2013-08-08 MED ORDER — SIMVASTATIN 40 MG PO TABS: ORAL_TABLET | ORAL | Status: DC

## 2013-08-08 MED ORDER — METFORMIN HCL 500 MG PO TABS: ORAL_TABLET | ORAL | Status: DC

## 2013-08-08 NOTE — Assessment & Plan Note (Signed)
New to provider, ongoing for pt.  UTD on eye exam.  On ARB.  Check labs.  Adjust meds prn

## 2013-08-08 NOTE — Assessment & Plan Note (Signed)
New to provider.  Hx of DVT s/p neck surgery.  Pt was treated w/ coumadin x6 months and now takes daily ASA.

## 2013-08-08 NOTE — Progress Notes (Signed)
Pre visit review using our clinic review tool, if applicable. No additional management support is needed unless otherwise documented below in the visit note. 

## 2013-08-08 NOTE — Assessment & Plan Note (Signed)
New to provider, ongoing for pt.  Well controlled.  Asymptomatic.  Check labs.  No anticipated med changes. 

## 2013-08-08 NOTE — Patient Instructions (Signed)
Schedule a fasting lab appt at your convenience Schedule your complete physical in 3-4 months (we'll also recheck the diabetes) We'll notify you of your lab results and make any changes if needed Try and make healthy food choices and get regular activity if possible Call with any questions or concerns Welcome!  We're glad to have you!!

## 2013-08-08 NOTE — Progress Notes (Signed)
   Subjective:    Patient ID: [name], male    DOB: 10/02/1945, 68 y.o.   MRN: [id number]  HPI New to establish.  Previous MD- Lenna Gilford  HTN- chronic problem, on Losartan.  No CP, SOB, HAs, visual changes, edema.  DM- chronic problem, on Metformin, Glimepiride.  UTD on eye exam- last done 3 months ago.  Denies symptomatic lows.  No numbness/tingling hands/feet.    Hyperlipidemia- chronic problem, on Zocor.  No N/V, myalgias, abd pain.  Hx DVT- pt had 1 clot s/p neck surgery and took 6 months of coumadin therapy and has not required regular blood thinners since.  Does take ASA daily.    Review of Systems For ROS see HPI     Objective:   Physical Exam  Vitals reviewed. Constitutional: He is oriented to person, place, and time. He appears well-developed and well-nourished. No distress.  HENT:  Head: Normocephalic and atraumatic.  Eyes: Conjunctivae and EOM are normal. Pupils are equal, round, and reactive to light.  Neck: Normal range of motion. Neck supple. No thyromegaly present.  Cardiovascular: Normal rate, regular rhythm, normal heart sounds and intact distal pulses.   No murmur heard. Pulmonary/Chest: Effort normal and breath sounds normal. No respiratory distress.  Abdominal: Soft. Bowel sounds are normal. He exhibits no distension.  Musculoskeletal: He exhibits no edema.  Lymphadenopathy:    He has no cervical adenopathy.  Neurological: He is alert and oriented to person, place, and time. No cranial nerve deficit.  Skin: Skin is warm and dry.  Psychiatric: He has a normal mood and affect. His behavior is normal.          Assessment & Plan:

## 2013-08-08 NOTE — Assessment & Plan Note (Signed)
New to provider, chronic problem for pt.  Tolerating statin w/o difficulty.  Check labs.  Adjust meds prn  

## 2013-08-09 ENCOUNTER — Telehealth: Payer: Self-pay | Admitting: Family Medicine

## 2013-08-09 NOTE — Telephone Encounter (Signed)
Relevant patient education mailed to patient.  

## 2013-08-14 ENCOUNTER — Other Ambulatory Visit (INDEPENDENT_AMBULATORY_CARE_PROVIDER_SITE_OTHER): Payer: Medicare Other

## 2013-08-14 DIAGNOSIS — E119 Type 2 diabetes mellitus without complications: Secondary | ICD-10-CM | POA: Diagnosis not present

## 2013-08-14 DIAGNOSIS — E785 Hyperlipidemia, unspecified: Secondary | ICD-10-CM

## 2013-08-14 DIAGNOSIS — I10 Essential (primary) hypertension: Secondary | ICD-10-CM

## 2013-08-14 LAB — BASIC METABOLIC PANEL
BUN: 19 mg/dL (ref 6–23)
CALCIUM: 9.4 mg/dL (ref 8.4–10.5)
CO2: 22 meq/L (ref 19–32)
CREATININE: 1.1 mg/dL (ref 0.4–1.5)
Chloride: 108 mEq/L (ref 96–112)
GFR: 72.13 mL/min (ref >60.00)
GLUCOSE: 118 mg/dL — AB (ref 70–99)
Potassium: 4.3 mEq/L (ref 3.5–5.1)
Sodium: 141 mEq/L (ref 135–145)

## 2013-08-14 LAB — TSH: TSH: 1.09 u[IU]/mL (ref 0.35–4.50)

## 2013-08-14 LAB — LIPID PANEL
CHOL/HDL RATIO: 3
Cholesterol: 109 mg/dL (ref 0–200)
HDL: 31.7 mg/dL — ABNORMAL LOW (ref >39.00)
LDL Cholesterol: 57 mg/dL (ref 0–99)
NONHDL: 77.3
Triglycerides: 100 mg/dL (ref 0.0–149.0)
VLDL: 20 mg/dL (ref 0.0–40.0)

## 2013-08-14 LAB — HEPATIC FUNCTION PANEL
ALT: 53 U/L (ref 0–53)
AST: 27 U/L (ref 0–37)
Albumin: 4.1 g/dL (ref 3.5–5.2)
Alkaline Phosphatase: 39 U/L (ref 39–117)
BILIRUBIN DIRECT: 0.1 mg/dL (ref 0.0–0.3)
BILIRUBIN TOTAL: 0.9 mg/dL (ref 0.2–1.2)
Total Protein: 7.1 g/dL (ref 6.0–8.3)

## 2013-08-14 LAB — HEMOGLOBIN A1C: Hgb A1c MFr Bld: 6.4 % (ref 4.6–6.5)

## 2013-08-15 ENCOUNTER — Encounter: Payer: Self-pay | Admitting: General Practice

## 2013-08-29 ENCOUNTER — Telehealth: Payer: Self-pay | Admitting: Pulmonary Disease

## 2013-08-29 DIAGNOSIS — R972 Elevated prostate specific antigen [PSA]: Secondary | ICD-10-CM | POA: Diagnosis not present

## 2013-08-29 NOTE — Telephone Encounter (Signed)
I spoke with Leigh to see if ok to schedule the pt. Per SN ok to [facility]le for pulm consult. If pt has humana he will need a referall. Per pt spouse he has regular medicare. Appt set. [place] Bing, CMA

## 2013-08-30 ENCOUNTER — Ambulatory Visit (INDEPENDENT_AMBULATORY_CARE_PROVIDER_SITE_OTHER): Payer: Medicare Other | Admitting: Pulmonary Disease

## 2013-08-30 ENCOUNTER — Encounter: Payer: Self-pay | Admitting: Pulmonary Disease

## 2013-08-30 ENCOUNTER — Ambulatory Visit (INDEPENDENT_AMBULATORY_CARE_PROVIDER_SITE_OTHER)
Admission: RE | Admit: 2013-08-30 | Discharge: 2013-08-30 | Disposition: A | Discharge status: Home / Self Care | Payer: Medicare Other | Source: Ambulatory Visit | Attending: Pulmonary Disease | Admitting: Pulmonary Disease

## 2013-08-30 VITALS — BP 128/84 | HR 85 | Temp 98.8°F | Ht 70.0 in | Wt 253.8 lb

## 2013-08-30 DIAGNOSIS — J45901 Unspecified asthma with (acute) exacerbation: Secondary | ICD-10-CM | POA: Diagnosis not present

## 2013-08-30 DIAGNOSIS — R059 Cough, unspecified: Secondary | ICD-10-CM | POA: Diagnosis not present

## 2013-08-30 DIAGNOSIS — J3089 Other allergic rhinitis: Secondary | ICD-10-CM

## 2013-08-30 DIAGNOSIS — R9389 Abnormal findings on diagnostic imaging of other specified body structures: Secondary | ICD-10-CM | POA: Insufficient documentation

## 2013-08-30 DIAGNOSIS — J4521 Mild intermittent asthma with (acute) exacerbation: Secondary | ICD-10-CM

## 2013-08-30 DIAGNOSIS — J309 Allergic rhinitis, unspecified: Secondary | ICD-10-CM | POA: Insufficient documentation

## 2013-08-30 DIAGNOSIS — J45909 Unspecified asthma, uncomplicated: Secondary | ICD-10-CM | POA: Insufficient documentation

## 2013-08-30 DIAGNOSIS — R0989 Other specified symptoms and signs involving the circulatory and respiratory systems: Secondary | ICD-10-CM | POA: Diagnosis not present

## 2013-08-30 DIAGNOSIS — J302 Other seasonal allergic rhinitis: Secondary | ICD-10-CM

## 2013-08-30 DIAGNOSIS — R05 Cough: Secondary | ICD-10-CM

## 2013-08-30 MED ORDER — HYDROCOD POLST-CHLORPHEN POLST 10-8 MG/5ML PO LQCR: 5.0000 mL | Freq: Two times a day (BID) | ORAL | Status: DC | PRN

## 2013-08-30 MED ORDER — PREDNISONE (PAK) 5 MG PO TABS: ORAL_TABLET | ORAL | Status: DC

## 2013-08-30 MED ORDER — METHYLPREDNISOLONE ACETATE 80 MG/ML IJ SUSP
80.0000 mg | Freq: Once | INTRAMUSCULAR | Status: AC
  Administered 2013-08-30: 80 mg via INTRAMUSCULAR

## 2013-08-30 NOTE — Progress Notes (Signed)
Subjective:    Patient ID: [name], male    DOB: 01-14-1945, 68 y.o.   MRN: [id number]  HPI 68 y/o WM here for a follow up visit... he has multiple medical problems including:  HBP;  VV/ VI/ hx DVT;  Hyperlipidemia;  DM;  Obesity;  GERD/ Divertics/ Colon polyps/ Hems;  Gallstone;  BPH/ Bladder outlet obstruction;  Kidney stones;  DJD/ LBP;  Anxiety... ~  SEE PREV EPIC NOTES FOR OLDER DATA >>   ~  November 23, 2011:  Yearly ROV & [name] had a recent URI- treated w/ ZPak, Mucinex, Hydromet & improving;  otherw reports a good yr, feeling well, no new complaints or concerns- just unable to lose the weight & we reviewed DIET, EXERCISE, wt reduction strategies...    HBP>  On Diovan 160mg tabs-1/2 tab daily + low sodium/ wt reducing diet;  BP= 126/78 & similar at home & denies CP/ palpit/ dizzy/ SOB/ incr edema etc...    Chol>  On Simva40 & FLP 11/13 showed TChol 129, TG 162, HDL 32, LDL 64;  Good control on med but needs better low fat diet & incr exercise...    DM>  On Metform500Bid + Glimep1mg -1/2;  FBS=164 & A1c=7.7 now> needs better diet, wt reduction & incr Glimep2mg  Qam...    Obese>  His weight is 259#, 70" tall, BMI= 36-7;  We have discussed diet & exercise w/ each office visit but he's had difficulty w/ compliance due to his arthritis & hopefully the TKRs will allow for more vigorous exercise program...    GI>  Hx GERD, Divertics, polyps> last colon 2010 by [name] was neg & f/u planned 65yrs;  He takes PROTONIX 40mg /d, and Zantac 150mg  Qhs...    GU>  Long hx kidney stones followed by [place];  NOTE- he is very allergic to Cipro;  Labs show BUN=17, Creat=1.0;  [name] placed him on Rapaflo to aide stone passing & he notes this really helps...    DJD, s/p bilat TKRs, LBP>  As above, s/p bilat TKRs, done w/ rehab & into his exercise program, using Voltaren as needed... We reviewed prob list, meds, xrays and labs> see below for updates >> he had Flu vaccine in Sept; wants  refills for 90d supplies...   LABS 11/13:  FLP- ok on Simva40 but TG=162 needs better low fat diet; Chems- ok x BS=164 A1c=7.7 & sl incr LFTs;  CBC- wnl;  TSH=0.41;  PSA=6.04 & refer to Urol.   ~  September 06, 2012:  36mo ROV & [name] is basically stable after a difficult time after GB surg 3/14- mult issues as noted below; c/o URI cough, clear sput & wants ZPak- ok... We reviewed the following medical problems during today's office visit >>     HBP>  On Diovan 160mg tabs-1/2 tab daily + low sodium/ wt reducing diet;  BP= 122/82 & similar at home & denies CP/ palpit/ dizzy/ SOB/ incr edema etc...    Chol>  On Simva40 & FLP 9/14 shows TChol 94, TG 93, HDL 29, LDL 46;  Good control on med but needs better low fat diet & incr exercise...    DM>  On Metform500Bid + Glimep2mg -1/2;  FBS=105 & A1c=6.7 now> needs better diet, wt reduction...    Obese>  His weight is down 10# to 249#, 70" tall, BMI= 36;  We have discussed diet & exercise w/ each office visit but he's had difficulty w/ compliance due to his arthritis & hopefully the TKRs will allow  for more vigorous exercise program...    GI>  Hx GERD, Divertics, polyps> last colon 2010 by [name] was neg & f/u planned 38yrs;  He takes PROTONIX 40mg /d, and Zantac 150mg  Qhs...    S/P GB surg 3/14 w/ 1 night in hosp & wife reports in detail- "it was a nightmare" w/ urinary retention, kidney infection, hypotonic bladder req self caths, Urecholine, now Rapaflo per DrTannenbaum...     GU>  Long hx kidney stones followed by [name];  NOTE- he is very allergic to Cipro;  Labs show BUN=19, Creat=1.1;  [name] placed him on Rapaflo to aide stone passing & he notes this really helps; Note: PSA=7.93 & we will forward to Urology.    DJD, s/p bilat TKRs, LBP>  As above, s/p bilat TKRs, done w/ rehab & into his exercise program, using Voltaren as needed... We reviewed prob list, meds, xrays and labs> see below for updates >> OK FLU vaccine today; refill meds  for 30d supplies per request...  LABS 9/14:  FLP- ok on Simva40 x HDL=29;  Chems- ok w/ BS=105 Cr=1.1;  CBC- wnl x MCV=77;  TSH=0.88;  PSA=7.93 & copy sent to DrTannenbaum...  ~  December 26, 2012:  46mo ROV & [name] is improved- no new complaints or concerns; here for fasting labs (see below)...    BP is controlled on Diovan160-1/2 tab daily; BP= 136/80 & he denies CP, palpit, SOB, edema, etc...    Chol is well controlled on diet + Simva40; encouraged to lose weight...     DM is stable on Metform500Bid plus Glimep2mg -1/2 tab Qam; Labs today showed BS= 132, A1c= 6.9.Marland KitchenMarland Kitchen    GI & GU are improved over time & approaching his baseline... We reviewed prob list, meds, xrays and labs> see below for updates >>   LABS 12/14:  Chems- ok w/ BS=132, A1c=6.9;  CBC- wnl w/ Hg=13.2 but MCV=77 and Fe=30 (8%sat);  B12 level = 465... REC to check stool cards and start FeSO4 325mg  daily w/ VitC...   ~  August 30, 2013:  18mo ROV & add-on appt requested for cough> [name] has established Primary Care coverage w/ [name] & was seen 08/08/13 (note reviewed)... He tells me he was recently on vacation & started coughing- dry paroxysmal cough, min beige sput on occas, no hemoptysis, sl hoarse, denies SOB or CP; he denies f/c/s, discolored mucus or drainage; the cough episodes can be quite violent & are freq assoc w/ a tickle in his throat, & sneezing; cough drops/ throat lozenges help some; he was exposed to pneumonia (mother in [place] recently treated for this); he takes Zyrtek 10mg  daily, other meds reviewed- on Cozaar for BP but not on an ACE & no known hx ACE reaction in past; he has known GERD on ProtonixBid & Zantac Qhs- we reviewed antireflux regimen...     Hx Pulm Nodule> CXR 2010 showed ? of nodule in LLL but CTChest showed this was an osteophyte, but also showed some mild basilar fibrosis & a 42mm RLL nodule; f/u CXRs have shown stable & no developing right sided nodule...     Medical problems include> HBP, VV/ VI/ hx  DVT, HL, DM, Obesity, GERD, Divertics, Polyps, s/p lap chole for stones 3/14, BPH/ UTI/ U retention/ hx elev PSA, kidney stones, DJD/ LBP, anxiety...  We reviewed prob list, meds, xrays and labs> see below for updates >>   CXR 8/15 showed borderline heart size, mild scarring & atx at left base, incr density on lateral  view is from lower thoracic osteophytes & similar to 2010 films, NAD...  PLAN>  We will treat Depo80, Pred dosepak, Tussionex; he will continue his Zyrtek10, Lozenges & cough drops; continue antireflux regimen; call w/ response in 1wk to see if further eval is warranted- IgE, RAST, allergy testing, etc...             Problem List:  ? of PULMONARY NODULE, LEFT LOWER LOBE (ICD-518.89) - CXR 3/10 showed ?nodular density on lat film & subseq CT Chest showed that this was an osteophyte, & also displayed some basilar fibrosis, 50mm RLL nodule, & 3cm right adrenal adenoma... ~  CXR 4/12 showed boderline heart size, clear lungs, large osteophytes in lower spine appear stable... ~  CXR 2/14 showed stable heart size, clear lungs, degen changes in lower TSpine, NAD.[name]. ~  CXR 8/15 showed borderline heart size, mild scarring & atx at left base, incr density on lateral view is from lower thoracic osteophytes & similar to 2010 films, NAD...    MEDICAL PROBLEMS FOLLOWED BY [name] >>   HYPERTENSION, BENIGN (ICD-401.1) - on ASA 81mg /d,  COZAAR 100mg /d... BP= 128/84 & he denies CP, palpit, SOB, edema...  VARICOSE VEINS, LOWER EXTREMITIES (ICD-454.9) VENOUS INSUFFICIENCY (ICD-459.81) Hx of DEEP VEIN THROMBOSIS (ICD-453.8) - he has VV & VI as baseline> hx left leg DVT after neck surgery 2007 treated w/ Coumadin x32mo; no recurrent prob after left leg fracture w/ surg 11/09 by Tammy Sours... ~  4/12:  Pre-op assessment for TKR> neg Ven Dopplers & neg hypercoagulable panel; OK for surg... ~  10/12:  He had left TKR 4/12 & right TKR 1/06; no complic, went thru rehab & now exercising on his  own...  HYPERLIPIDEMIA (ICD-272.4) - on SIMVASTATIN 40mg /d...  FLP 8/15 showed TChol 109, TG 100, HDL 32, LDL 57  DIABETES MELLITUS (ICD-250.00) - on METFORMIN 500mg Bid & GLIMEPIRIDE 2mg /d...  Labs 8/15 showed FBS=118, A1c=6.4  OBESITY >> he has been unable to lose weight despite diet, exercise, etc...  Weight = 254#, YIR~48-54...  G E R D (ICD-530.81) >> on Protonix 40mg  bid, Zantac 150mg  Qhs... He follows a strict antireflux regimen...   DIVERTICULOSIS OF COLON (ICD-562.10) HEMORRHOIDS (ICD-455.6) COLONIC POLYPS (ICD-211.3) >> last colonoscopy 2010 by [name]- neg x diminutive polyp w/ neg bx, f/u rec 42yrs...  CHOLELITHIASIS (ICD-574.20) - gallstone noted on sonar 8/04...  NAFLD >> he knows the importance of weight reduction... ~  3/14: S/P Lap Chole for chr cholecystitis & gallstones 3/14 by DrWakefield, [facility]- he had a diffiicult time after the surg w/ UTI, Urin retention, etc  BENIGN PROSTATIC HYPERTROPHY, WITH OBSTRUCTION (ICD-600.01) - neg biopsies 10/06 by DrDahlstedt... Hx PROSTATITIS,  ED,  PEYRONIES >> ELEVATED PSA Readings >>  ~  3/14: he had difficult time w/ Urinary retention after GB surg>  ~  6/14: note from DrTannenbaum> on Rapaflo for urinary retention & hypotonic bladder; treated for UTI, PVR down to zero...  NEPHROLITHIASIS (ICD-592.0) - followed by DrPeterson etal> "I've had them ever since I was 16"... ~  CT Abd 7/09 by Urology showed bilat non-obstructing stones, incidental 2-3cm right adrenal adenoma, DJD Lspine. ~  2/10: states he passed a sm stone on his own... ~  7/11:  s/p cysto, ureteroscopy, laser frag & stent for yet another stone. ~  10/12:  He reports passing several sm stones over the last few mo, on Rapaflo per [name]... ~  11/13:  He tells me that he continues to pass stones on & off... ~  2014: he  tells me he passed an additional 2 kidney stones during his post op GB period!  DEGENERATIVE JOINT DISEASE (ICD-715.90) - c/o bilat knee pain> he saw  DrSupple for Ortho w/ shot in right knee 2010 (helped); shot didn't help on left- therefore left knee arthroscopy 8/11... ~  4/12:  S/p left TKR by DrOlin... ~  7/12:  S/p right TKR by DrOlin; he has been thru the PT & doing well, exercising 2d/wk at gym, on diet etc...  LOW BACK PAIN SYNDROME (ICD-724.2) - prev hx of T1-T2 surgery for HNP.[name]. recent eval by Shara Blazing, DrTooke, DrWang... decided against surg and DrWang tried a shot to "burn" the nerve- "I'm 60% better"... still doing exercises and taking Celebrex- we decided to change to Mobic/ Tylenol Rx...  ANXIETY (ICD-300.00)   Past Medical History  Diagnosis Date  . Anxiety   . Low back pain syndrome   . DJD (degenerative joint disease)   . Nephrolithiasis   . BPH (benign prostatic hypertrophy) with urinary obstruction   . Cholelithiasis   . Hemorrhoids   . History of colonic polyps   . Diverticulosis of colon   . GERD (gastroesophageal reflux disease)   . Diabetes mellitus   . Hyperlipidemia   . History of DVT (deep vein thrombosis)   . Venous insufficiency   . Varicose veins of lower extremities   . Hypertension, benign   . pulmonary nodule, left lower lobe   . Clotting disorder   . History of kidney stones     Past Surgical History  Procedure Laterality Date  . Sebaceous cyst surgery    . Bilateral inguinal hernia repairs    . T1-t2 disc surgery  2007    by Dr. Louanne Skye  . Left ankle surgery with plate and screws  54/2706    by Dr. Sharol Given  . Total knee arthroplasty      right and left  . Cervical disc arthroplasty    . Tonsillectomy    . Appendectomy    . Hernia repair      ventral hernia with mesh 1990s  . Joint replacement    . Cholecystectomy  03/07/2012  . Cholecystectomy N/A 03/07/2012    Procedure: LAPAROSCOPIC CHOLECYSTECTOMY;  Surgeon: Rolm Bookbinder, MD;  Location: [facility];  Service: General;  Laterality: N/A;    Outpatient Encounter Prescriptions as of 08/30/2013  Medication Sig  . aspirin EC 81 MG tablet  Take 81 mg by mouth daily.  . diclofenac (VOLTAREN) 75 MG EC tablet TAKE 1 TABLET BY MOUTH TWICE A DAY WITH A MEAL  . ferrous sulfate 325 (65 FE) MG tablet Take 325 mg by mouth daily with breakfast.  . glimepiride (AMARYL) 2 MG tablet TAKE 1 TABLET (2 MG TOTAL) BY MOUTH DAILY BEFORE BREAKFAST.  [name] losartan (COZAAR) 100 MG tablet Take 1 tablet (100 mg total) by mouth daily.  . metFORMIN (GLUCOPHAGE) 500 MG tablet TAKE 1 TABLET (500 MG TOTAL) BY MOUTH 2 (TWO) TIMES DAILY WITH A MEAL.  . multivitamin (THERAGRAN) per tablet Take 1 tablet by mouth daily.   . pantoprazole (PROTONIX) 40 MG tablet TAKE 1 TABLET (40 MG TOTAL) BY MOUTH 2 (TWO) TIMES DAILY.  . ranitidine (ZANTAC) 150 MG capsule Take 150 mg by mouth at bedtime.   . silodosin (RAPAFLO) 4 MG CAPS capsule Take 4 mg by mouth daily with breakfast.   . simvastatin (ZOCOR) 40 MG tablet TAKE 1 TABLET (40 MG TOTAL) BY MOUTH AT BEDTIME.  . chlorpheniramine-HYDROcodone (TUSSIONEX PENNKINETIC ER) 10-8  MG/5ML LQCR Take 5 mLs by mouth every 12 (twelve) hours as needed for cough.  . predniSONE (STERAPRED UNI-PAK) 5 MG TABS tablet Take as directed  . [EXPIRED] methylPREDNISolone acetate (DEPO-MEDROL) injection 80 mg     Allergies  Allergen Reactions  . Ciprofloxacin     REACTION: rash  . Niacin     REACTION: hypertension, shaky    Current Medications, Allergies, Past Medical History, Past Surgical History, Family History, and Social History were reviewed in [employer] record.    Review of Systems        See HPI - all other systems neg except as noted... The patient complains of difficulty walking.  The patient denies anorexia, fever, weight loss, weight gain, vision loss, decreased hearing, hoarseness, chest pain, syncope, ch in dyspnea on exertion, peripheral edema, prolonged cough, headaches, hemoptysis, abdominal pain, melena, hematochezia, severe indigestion/heartburn, hematuria, incontinence, muscle weakness, suspicious  skin lesions, transient blindness, depression, unusual weight change, abnormal bleeding, enlarged lymph nodes, and angioedema.     Objective:   Physical Exam     WD, Obese, 68 y/o WM in NAD... GENERAL:  Alert & oriented; pleasant & cooperative... HEENT:  [place]/AT, EOM-wnl, PERRLA, EACs-clear, TMs-wnl, NOSE-clear, THROAT-clear & wnl. NECK:  Supple w/ fair ROM; no JVD; normal carotid impulses w/o bruits; no thyromegaly or nodules palpated; no lymphadenopathy. CHEST:  Clear to P & A; dry cough in paroxysms, without wheezes/ rales/ or rhonchi. HEART:  Regular Rhythm; without murmurs/ rubs/ or gallops. ABDOMEN:  Obese, soft & nontender; normal bowel sounds; no organomegaly or masses detected. EXT: s/p bilat TKRs, mod arthritic changes & sl decr ROM knees; +varicose veins/ +venous insuffic/ tr edema. NEURO:  CN's intact; motor testing normal; sensory testing normal; gait normal & balance OK. DERM:  no rash etc...  RADIOLOGY DATA:  Reviewed in the EPIC EMR & discussed w/ the patient...  LABORATORY DATA:  Reviewed in the EPIC EMR & discussed w/ the patient...   Assessment & Plan:    COUGH >> ABH vs allergic diathesis- We will treat Depo80, Pred dosepak, Tussionex; he will continue his Zyrtek10, Lozenges & cough drops; continue antireflux regimen; call w/ response in 1wk to see if further eval is warranted- IgE, RAST, allergy testing, etc.    HBP>  On Cozaar100 + low sodium/ wt reducing diet;  BP conjtrolled, continue same med + diet, exercise, wt reduction...     Chol>  On Simva40 & FLP looks good control on med but needs better low fat diet & incr exercise...     DM>  On Metform500Bid + Glimep2 w/ A1c at 6.4, needs better diet, wt reduction & continue meds...     Obese>  His weight is 254#, 70" tall, BMI= 36;  We have discussed diet & exercise w/ each office visit....     GI> Hx GERD, Divertics, polyps> last colon 2010 by [name] was neg & f/u planned 34yrs;  He takes Protonix 40mg /d, and  Zantac 150mg  Qhs; s/p Lap Chole 3/14...     GU>  Long hx kidney stones followed by [facility];  NOTE- he is very allergic to Cipro;  Labs show BUN=17, Creat=1.0;  He had a rough time w/ Urinary retetion & hypotonic bladder after surg; [name] placed him on Rapaflo to aide stone passing; PSA is up to 7.93 & needs f/u by Urology...     DJD>  As above, s/p bilat TKRs, done w/ rehab & into his exercise program, using Voltaren as needed.Marland KitchenMarland Kitchen  Patient's Medications  New Prescriptions   CHLORPHENIRAMINE-HYDROCODONE (TUSSIONEX PENNKINETIC ER) 10-8 MG/5ML LQCR    Take 5 mLs by mouth every 12 (twelve) hours as needed for cough.   PREDNISONE (STERAPRED UNI-PAK) 5 MG TABS TABLET    Take as directed  Previous Medications   ASPIRIN EC 81 MG TABLET    Take 81 mg by mouth daily.   DICLOFENAC (VOLTAREN) 75 MG EC TABLET    TAKE 1 TABLET BY MOUTH TWICE A DAY WITH A MEAL   FERROUS SULFATE 325 (65 FE) MG TABLET    Take 325 mg by mouth daily with breakfast.   GLIMEPIRIDE (AMARYL) 2 MG TABLET    TAKE 1 TABLET (2 MG TOTAL) BY MOUTH DAILY BEFORE BREAKFAST.   LOSARTAN (COZAAR) 100 MG TABLET    Take 1 tablet (100 mg total) by mouth daily.   METFORMIN (GLUCOPHAGE) 500 MG TABLET    TAKE 1 TABLET (500 MG TOTAL) BY MOUTH 2 (TWO) TIMES DAILY WITH A MEAL.   MULTIVITAMIN (THERAGRAN) PER TABLET    Take 1 tablet by mouth daily.    PANTOPRAZOLE (PROTONIX) 40 MG TABLET    TAKE 1 TABLET (40 MG TOTAL) BY MOUTH 2 (TWO) TIMES DAILY.   RANITIDINE (ZANTAC) 150 MG CAPSULE    Take 150 mg by mouth at bedtime.    SILODOSIN (RAPAFLO) 4 MG CAPS CAPSULE    Take 4 mg by mouth daily with breakfast.    SIMVASTATIN (ZOCOR) 40 MG TABLET    TAKE 1 TABLET (40 MG TOTAL) BY MOUTH AT BEDTIME.  Modified Medications   No medications on file  Discontinued Medications   No medications on file

## 2013-08-30 NOTE — Patient Instructions (Signed)
Today we updated your med list in our EPIC system...    Continue your current medications the same...  Today we gave you a Depo shot & wrote new prescriptions for a Prednisone dosepak (start in the AM 8/28) and for TUSSIONEX cough syrup to use 1 tsp every 12h as needed...  Be sure to continue your antireflux regimen w/ Protonix twice daily & Zantac at bedtime w/ sip of water, elev the head of your bed 6", & don't eat or drink much after dinner in the eve...   Today we rechecked your CXR...    We will contact you w/ the results when available...   Please let us know how the cough is doing next week so we can determine if further eval is warranted.Marland KitchenMarland Kitchen

## 2013-09-05 ENCOUNTER — Telehealth: Payer: Self-pay | Admitting: *Deleted

## 2013-09-05 ENCOUNTER — Telehealth: Payer: Self-pay | Admitting: Pulmonary Disease

## 2013-09-05 NOTE — Telephone Encounter (Signed)
Per SN--- At this point will need throat lozenges Cough drops Ok to refill the tussionex if he needs it.    Called and spoke with pt and he is aware of SN recs and pt to call back if no better in the next couple of days.

## 2013-09-05 NOTE — Telephone Encounter (Signed)
Entered in error

## 2013-09-05 NOTE — Telephone Encounter (Signed)
Called and spoke with pt and pt was given cxr results per SN.  Pt stated that he will complete his medications today but he does still have the cough meds.  He stated that he is still having a dry cough with the tickle in his throat.  Pt wanted to see if anything further needed to be done.  SN please advise. Thanks  Allergies  Allergen Reactions  . Ciprofloxacin     REACTION: rash  . Niacin     REACTION: hypertension, shaky    Current Outpatient Prescriptions on File Prior to Visit  Medication Sig Dispense Refill  . aspirin EC 81 MG tablet Take 81 mg by mouth daily.      . chlorpheniramine-HYDROcodone (TUSSIONEX PENNKINETIC ER) 10-8 MG/5ML LQCR Take 5 mLs by mouth every 12 (twelve) hours as needed for cough.  120 mL  0  . diclofenac (VOLTAREN) 75 MG EC tablet TAKE 1 TABLET BY MOUTH TWICE A DAY WITH A MEAL  180 tablet  1  . ferrous sulfate 325 (65 FE) MG tablet Take 325 mg by mouth daily with breakfast.      . glimepiride (AMARYL) 2 MG tablet TAKE 1 TABLET (2 MG TOTAL) BY MOUTH DAILY BEFORE BREAKFAST.  30 tablet  6  . losartan (COZAAR) 100 MG tablet Take 1 tablet (100 mg total) by mouth daily.  30 tablet  5  . metFORMIN (GLUCOPHAGE) 500 MG tablet TAKE 1 TABLET (500 MG TOTAL) BY MOUTH 2 (TWO) TIMES DAILY WITH A MEAL.  60 tablet  4  . multivitamin (THERAGRAN) per tablet Take 1 tablet by mouth daily.       . pantoprazole (PROTONIX) 40 MG tablet TAKE 1 TABLET (40 MG TOTAL) BY MOUTH 2 (TWO) TIMES DAILY.  60 tablet  6  . predniSONE (STERAPRED UNI-PAK) 5 MG TABS tablet Take as directed  1 each  0  . ranitidine (ZANTAC) 150 MG capsule Take 150 mg by mouth at bedtime.       . silodosin (RAPAFLO) 4 MG CAPS capsule Take 4 mg by mouth daily with breakfast.       . simvastatin (ZOCOR) 40 MG tablet TAKE 1 TABLET (40 MG TOTAL) BY MOUTH AT BEDTIME.  30 tablet  6   No current facility-administered medications on file prior to visit.

## 2013-09-11 ENCOUNTER — Telehealth: Payer: Self-pay | Admitting: Pulmonary Disease

## 2013-09-11 NOTE — Telephone Encounter (Signed)
Called spoke with pt.  He reports he is still feeling very  congested, prod cough-clear phlem. no wheezing, no chest tx. Pt reports he was not told to take mucinex so he has not been. Please advise SN thanks  Allergies  Allergen Reactions  . Ciprofloxacin     REACTION: rash  . Niacin     REACTION: hypertension, shaky     Current Outpatient Prescriptions on File Prior to Visit  Medication Sig Dispense Refill  . aspirin EC 81 MG tablet Take 81 mg by mouth daily.      . chlorpheniramine-HYDROcodone (TUSSIONEX PENNKINETIC ER) 10-8 MG/5ML LQCR Take 5 mLs by mouth every 12 (twelve) hours as needed for cough.  120 mL  0  . diclofenac (VOLTAREN) 75 MG EC tablet TAKE 1 TABLET BY MOUTH TWICE A DAY WITH A MEAL  180 tablet  1  . ferrous sulfate 325 (65 FE) MG tablet Take 325 mg by mouth daily with breakfast.      . glimepiride (AMARYL) 2 MG tablet TAKE 1 TABLET (2 MG TOTAL) BY MOUTH DAILY BEFORE BREAKFAST.  30 tablet  6  . losartan (COZAAR) 100 MG tablet Take 1 tablet (100 mg total) by mouth daily.  30 tablet  5  . metFORMIN (GLUCOPHAGE) 500 MG tablet TAKE 1 TABLET (500 MG TOTAL) BY MOUTH 2 (TWO) TIMES DAILY WITH A MEAL.  60 tablet  4  . multivitamin (THERAGRAN) per tablet Take 1 tablet by mouth daily.       . pantoprazole (PROTONIX) 40 MG tablet TAKE 1 TABLET (40 MG TOTAL) BY MOUTH 2 (TWO) TIMES DAILY.  60 tablet  6  . predniSONE (STERAPRED UNI-PAK) 5 MG TABS tablet Take as directed  1 each  0  . ranitidine (ZANTAC) 150 MG capsule Take 150 mg by mouth at bedtime.       . silodosin (RAPAFLO) 4 MG CAPS capsule Take 4 mg by mouth daily with breakfast.       . simvastatin (ZOCOR) 40 MG tablet TAKE 1 TABLET (40 MG TOTAL) BY MOUTH AT BEDTIME.  30 tablet  6   No current facility-administered medications on file prior to visit.

## 2013-09-11 NOTE — Telephone Encounter (Signed)
Per 08/30/13 OV w/ SN; Patient Instructions      Today we updated your med list in our EPIC system...    Continue your current medications the same... Today we gave you a Depo shot & wrote new prescriptions for a Prednisone dosepak (start in the AM 8/28) and for TUSSIONEX cough syrup to use 1 tsp every 12h as needed... Be sure to continue your antireflux regimen w/ Protonix twice daily & Zantac at bedtime w/ sip of water, elev the head of your bed 6", & don't eat or drink much after dinner in the eve...  Today we rechecked your CXR...    We will contact you w/ the results when available...  Please let us know how the cough is doing next week so we can determine if further eval is warranted...    LMTCB x1

## 2013-09-11 NOTE — Telephone Encounter (Signed)
Per SN---  Please take mucinex 600 mg  1 po QID With plenty of fluids.  thanks

## 2013-09-11 NOTE — Telephone Encounter (Signed)
Called spoke with pt. Aware of recs.  Nothing further needed 

## 2013-09-11 NOTE — Telephone Encounter (Signed)
Pt returning call. [phone]

## 2013-10-26 ENCOUNTER — Ambulatory Visit (INDEPENDENT_AMBULATORY_CARE_PROVIDER_SITE_OTHER): Payer: Medicare Other

## 2013-10-26 DIAGNOSIS — Z23 Encounter for immunization: Secondary | ICD-10-CM

## 2013-12-18 ENCOUNTER — Encounter: Payer: Self-pay | Admitting: Family Medicine

## 2013-12-18 ENCOUNTER — Ambulatory Visit (INDEPENDENT_AMBULATORY_CARE_PROVIDER_SITE_OTHER): Payer: Medicare Other | Admitting: Family Medicine

## 2013-12-18 VITALS — BP 126/86 | HR 85 | Temp 97.7°F | Resp 16 | Ht 70.5 in | Wt 250.5 lb

## 2013-12-18 DIAGNOSIS — Z0000 Encounter for general adult medical examination without abnormal findings: Secondary | ICD-10-CM

## 2013-12-18 DIAGNOSIS — Z23 Encounter for immunization: Secondary | ICD-10-CM | POA: Diagnosis not present

## 2013-12-18 DIAGNOSIS — E119 Type 2 diabetes mellitus without complications: Secondary | ICD-10-CM

## 2013-12-18 DIAGNOSIS — I10 Essential (primary) hypertension: Secondary | ICD-10-CM | POA: Diagnosis not present

## 2013-12-18 DIAGNOSIS — E785 Hyperlipidemia, unspecified: Secondary | ICD-10-CM | POA: Diagnosis not present

## 2013-12-18 LAB — LIPID PANEL
Cholesterol: 111 mg/dL (ref 0–200)
HDL: 30.8 mg/dL — AB (ref >39.00)
LDL Cholesterol: 52 mg/dL (ref 0–99)
NonHDL: 80.2
TRIGLYCERIDES: 139 mg/dL (ref 0.0–149.0)
Total CHOL/HDL Ratio: 4
VLDL: 27.8 mg/dL (ref 0.0–40.0)

## 2013-12-18 LAB — BASIC METABOLIC PANEL
BUN: 16 mg/dL (ref 6–23)
CO2: 24 meq/L (ref 19–32)
CREATININE: 1 mg/dL (ref 0.4–1.5)
Calcium: 9.2 mg/dL (ref 8.4–10.5)
Chloride: 109 mEq/L (ref 96–112)
GFR: 80.6 mL/min (ref >60.00)
Glucose, Bld: 115 mg/dL — ABNORMAL HIGH (ref 70–99)
Potassium: 4.2 mEq/L (ref 3.5–5.1)
Sodium: 138 mEq/L (ref 135–145)

## 2013-12-18 LAB — CBC WITH DIFFERENTIAL/PLATELET
BASOS ABS: 0 10*3/uL (ref 0.0–0.1)
Basophils Relative: 0.3 % (ref 0.0–3.0)
EOS PCT: 2.4 % (ref 0.0–5.0)
Eosinophils Absolute: 0.2 10*3/uL (ref 0.0–0.7)
HCT: 47.9 % (ref 39.0–52.0)
Hemoglobin: 15.6 g/dL (ref 13.0–17.0)
LYMPHS PCT: 20.5 % (ref 12.0–46.0)
Lymphs Abs: 1.8 10*3/uL (ref 0.7–4.0)
MCHC: 32.7 g/dL (ref 30.0–36.0)
MCV: 95.5 fl (ref 78.0–100.0)
Monocytes Absolute: 0.9 10*3/uL (ref 0.1–1.0)
Monocytes Relative: 9.8 % (ref 3.0–12.0)
NEUTROS PCT: 67 % (ref 43.0–77.0)
Neutro Abs: 5.9 10*3/uL (ref 1.4–7.7)
Platelets: 250 10*3/uL (ref 150.0–400.0)
RBC: 5.01 Mil/uL (ref 4.22–5.81)
RDW: 13.4 % (ref 11.5–15.5)
WBC: 8.8 10*3/uL (ref 4.0–10.5)

## 2013-12-18 LAB — HEPATIC FUNCTION PANEL
ALT: 45 U/L (ref 0–53)
AST: 27 U/L (ref 0–37)
Albumin: 4 g/dL (ref 3.5–5.2)
Alkaline Phosphatase: 39 U/L (ref 39–117)
Bilirubin, Direct: 0.1 mg/dL (ref 0.0–0.3)
TOTAL PROTEIN: 6.9 g/dL (ref 6.0–8.3)
Total Bilirubin: 0.8 mg/dL (ref 0.2–1.2)

## 2013-12-18 LAB — TSH: TSH: 0.59 u[IU]/mL (ref 0.35–4.50)

## 2013-12-18 LAB — HEMOGLOBIN A1C: HEMOGLOBIN A1C: 6.3 % (ref 4.6–6.5)

## 2013-12-18 NOTE — Patient Instructions (Signed)
Follow up in 3-4 months to recheck diabetes We'll notify you of your lab results and make any changes if needed Keep up the good work on healthy diet and regular exercise You are up to date on colonoscopy until 2020 Call with any questions or concerns Happy Holidays!!!

## 2013-12-18 NOTE — Progress Notes (Signed)
Pre visit review using our clinic review tool, if applicable. No additional management support is needed unless otherwise documented below in the visit note. 

## 2013-12-18 NOTE — Assessment & Plan Note (Signed)
Chronic problem.  UTD on eye exam.  On ARB for renal protection.  Has lost 5 lbs since last visit.  Check labs.  Adjust meds prn

## 2013-12-18 NOTE — Assessment & Plan Note (Signed)
Chronic problem.  Adequate control.  Asymptomatic.  Check labs.  No anticipated med changes 

## 2013-12-18 NOTE — Progress Notes (Signed)
   Subjective:    Patient ID: [name], male    DOB: October 04, 1945, 68 y.o.   MRN: [id number]  HPI Here today for CPE.  Risk Factors: DM- chronic problem, on Amaryl, Metformin.  Has last 5 lbs since August.  UTD on eye exam.  On ARB for renal protection. HTN- chronic problem, on Losartan Hyperlipidemia- chronic problem, on Simvastatin Physical Activity: walking daily and going to the GYM Fall Risk: low Depression: denies current sxs Hearing: normal to conversational tones and whispered voice at 6 ft ADL's: independent Cognitive: normal linear thought process, memory and attention intact Home Safety: safe at home, lives w/ wife Height, Weight, BMI, Visual Acuity: see vitals, vision corrected to 20/20 w/ glasses Counseling: UTD on colonoscopy, eye exam, urology Labs Ordered: See A&P Care Plan: See A&P    Review of Systems Patient reports no vision/hearing changes, anorexia, fever ,adenopathy, persistant/recurrent hoarseness, swallowing issues, chest pain, palpitations, edema, persistant/recurrent cough, hemoptysis, dyspnea (rest,exertional, paroxysmal nocturnal), gastrointestinal  bleeding (melena, rectal bleeding), abdominal pain, excessive heart burn, GU symptoms (dysuria, hematuria, voiding/incontinence issues) syncope, focal weakness, memory loss, numbness & tingling, skin/hair/nail changes, depression, anxiety, abnormal bruising/bleeding, musculoskeletal symptoms/signs.     Objective:   Physical Exam General Appearance:    Alert, cooperative, no distress, appears stated age  Head:    Normocephalic, without obvious abnormality, atraumatic  Eyes:    PERRL, conjunctiva/corneas clear, EOM's intact, fundi    benign, both eyes       Ears:    Normal TM's and external ear canals, both ears  Nose:   Nares normal, septum midline, mucosa normal, no drainage   or sinus tenderness  Throat:   Lips, mucosa, and tongue normal; teeth and gums normal  Neck:   Supple, symmetrical, trachea  midline, no adenopathy;       thyroid:  No enlargement/tenderness/nodules  Back:     Symmetric, no curvature, ROM normal, no CVA tenderness  Lungs:     Clear to auscultation bilaterally, respirations unlabored  Chest wall:    No tenderness or deformity  Heart:    Regular rate and rhythm, S1 and S2 normal, no murmur, rub   or gallop  Abdomen:     Soft, non-tender, bowel sounds active all four quadrants,    no masses, no organomegaly  Genitalia:    Deferred to urology  Rectal:    Extremities:   Extremities normal, atraumatic, no cyanosis or edema  Pulses:   2+ and symmetric all extremities  Skin:   Skin color, texture, turgor normal, no rashes or lesions  Lymph nodes:   Cervical, supraclavicular, and axillary nodes normal  Neurologic:   CNII-XII intact. Normal strength, sensation and reflexes      throughout          Assessment & Plan:

## 2013-12-18 NOTE — Assessment & Plan Note (Signed)
Pt's PE WNL w/ exception of obesity- which he is working on.  UTD on health maintenance.  Written screening schedule updated and given to pt.  Check labs.  Anticipatory guidance provided.

## 2013-12-18 NOTE — Assessment & Plan Note (Signed)
Chronic problem.  Tolerating statin w/o difficulty.  Check labs.  Adjust meds prn  

## 2013-12-19 ENCOUNTER — Encounter: Payer: Self-pay | Admitting: General Practice

## 2014-01-04 DIAGNOSIS — R109 Unspecified abdominal pain: Secondary | ICD-10-CM

## 2014-01-04 HISTORY — DX: Unspecified abdominal pain: R10.9

## 2014-01-15 ENCOUNTER — Other Ambulatory Visit: Payer: Self-pay | Admitting: Family Medicine

## 2014-01-15 NOTE — Telephone Encounter (Signed)
Med filled.  

## 2014-03-01 ENCOUNTER — Other Ambulatory Visit: Payer: Self-pay | Admitting: *Deleted

## 2014-03-01 MED ORDER — LOSARTAN POTASSIUM 100 MG PO TABS: 100.0000 mg | ORAL_TABLET | Freq: Every day | ORAL | Status: DC

## 2014-03-15 DIAGNOSIS — N312 Flaccid neuropathic bladder, not elsewhere classified: Secondary | ICD-10-CM | POA: Diagnosis not present

## 2014-03-15 DIAGNOSIS — N401 Enlarged prostate with lower urinary tract symptoms: Secondary | ICD-10-CM | POA: Diagnosis not present

## 2014-03-15 DIAGNOSIS — N138 Other obstructive and reflux uropathy: Secondary | ICD-10-CM | POA: Diagnosis not present

## 2014-03-20 DIAGNOSIS — N312 Flaccid neuropathic bladder, not elsewhere classified: Secondary | ICD-10-CM | POA: Diagnosis not present

## 2014-03-20 DIAGNOSIS — N390 Urinary tract infection, site not specified: Secondary | ICD-10-CM | POA: Diagnosis not present

## 2014-03-22 ENCOUNTER — Telehealth: Payer: Self-pay | Admitting: Family Medicine

## 2014-03-22 MED ORDER — DICLOFENAC SODIUM 75 MG PO TBEC: DELAYED_RELEASE_TABLET | ORAL | Status: DC

## 2014-03-22 NOTE — Telephone Encounter (Signed)
Caller name: Miller, [place] Relation to pt: self  Call back number: [phone] Pharmacy: [id number] - [place], [place] [phone] (Phone) [phone] (Fax)        Reason for call:  Pt requesting a refill diclofenac (VOLTAREN) 75 MG EC tablet

## 2014-03-22 NOTE — Telephone Encounter (Signed)
Med filled.  

## 2014-04-02 ENCOUNTER — Telehealth: Payer: Self-pay | Admitting: Family Medicine

## 2014-04-02 MED ORDER — PANTOPRAZOLE SODIUM 40 MG PO TBEC: DELAYED_RELEASE_TABLET | ORAL | Status: DC

## 2014-04-02 NOTE — Telephone Encounter (Signed)
Caller name: [place] Relation to pt: self Call back number:  [phone] [facility] [address]  Reason for call:   Requesting a pantoprazole refill.

## 2014-04-02 NOTE — Telephone Encounter (Signed)
Med filled.  

## 2014-04-10 ENCOUNTER — Other Ambulatory Visit: Payer: Self-pay | Admitting: General Practice

## 2014-04-10 DIAGNOSIS — Z471 Aftercare following joint replacement surgery: Secondary | ICD-10-CM | POA: Diagnosis not present

## 2014-04-10 DIAGNOSIS — M545 Low back pain: Secondary | ICD-10-CM | POA: Diagnosis not present

## 2014-04-10 DIAGNOSIS — Z96653 Presence of artificial knee joint, bilateral: Secondary | ICD-10-CM | POA: Diagnosis not present

## 2014-04-10 MED ORDER — SIMVASTATIN 40 MG PO TABS: ORAL_TABLET | ORAL | Status: DC

## 2014-04-19 DIAGNOSIS — M545 Low back pain: Secondary | ICD-10-CM | POA: Diagnosis not present

## 2014-04-22 ENCOUNTER — Ambulatory Visit (INDEPENDENT_AMBULATORY_CARE_PROVIDER_SITE_OTHER): Payer: Medicare Other | Admitting: Family Medicine

## 2014-04-22 ENCOUNTER — Encounter: Payer: Self-pay | Admitting: Family Medicine

## 2014-04-22 VITALS — BP 124/54 | HR 90 | Temp 98.0°F | Resp 16 | Wt 248.5 lb

## 2014-04-22 DIAGNOSIS — E109 Type 1 diabetes mellitus without complications: Secondary | ICD-10-CM

## 2014-04-22 LAB — HEMOGLOBIN A1C: Hgb A1c MFr Bld: 6.3 % (ref 4.6–6.5)

## 2014-04-22 LAB — BASIC METABOLIC PANEL
BUN: 18 mg/dL (ref 6–23)
CO2: 24 mEq/L (ref 19–32)
CREATININE: 0.95 mg/dL (ref 0.40–1.50)
Calcium: 9 mg/dL (ref 8.4–10.5)
Chloride: 107 mEq/L (ref 96–112)
GFR: 83.46 mL/min (ref >60.00)
Glucose, Bld: 132 mg/dL — ABNORMAL HIGH (ref 70–99)
Potassium: 3.9 mEq/L (ref 3.5–5.1)
Sodium: 139 mEq/L (ref 135–145)

## 2014-04-22 MED ORDER — LOSARTAN POTASSIUM 100 MG PO TABS: 100.0000 mg | ORAL_TABLET | Freq: Every day | ORAL | Status: DC

## 2014-04-22 MED ORDER — PANTOPRAZOLE SODIUM 40 MG PO TBEC: DELAYED_RELEASE_TABLET | ORAL | Status: DC

## 2014-04-22 MED ORDER — METFORMIN HCL 500 MG PO TABS: ORAL_TABLET | ORAL | Status: DC

## 2014-04-22 MED ORDER — GLIMEPIRIDE 2 MG PO TABS: ORAL_TABLET | ORAL | Status: DC

## 2014-04-22 MED ORDER — SIMVASTATIN 40 MG PO TABS: ORAL_TABLET | ORAL | Status: DC

## 2014-04-22 NOTE — Progress Notes (Signed)
   Subjective:    Patient ID: [name], male    DOB: 06-09-1945, 69 y.o.   MRN: [id number]  HPI DM- chronic problem, on ARB for renal protection.  On Metformin and Amaryl.  UTD on eye exam- due next month.  Has lost 3-4 lbs since last visit.  Pt reports feeling well.  No CP, SOB, HAs, visual changes, edema, abd pain, N/V/D, weakness/numbness of hands/feet.   Review of Systems For ROS see HPI     Objective:   Physical Exam  Constitutional: He is oriented to person, place, and time. He appears well-developed and well-nourished. No distress.  HENT:  Head: Normocephalic and atraumatic.  Eyes: Conjunctivae and EOM are normal. Pupils are equal, round, and reactive to light.  Neck: Normal range of motion. Neck supple. No thyromegaly present.  Cardiovascular: Normal rate, regular rhythm, normal heart sounds and intact distal pulses.   No murmur heard. Pulmonary/Chest: Effort normal and breath sounds normal. No respiratory distress.  Abdominal: Soft. Bowel sounds are normal. He exhibits no distension.  Musculoskeletal: He exhibits no edema.  Lymphadenopathy:    He has no cervical adenopathy.  Neurological: He is alert and oriented to person, place, and time. No cranial nerve deficit.  Skin: Skin is warm and dry.  Psychiatric: He has a normal mood and affect. His behavior is normal.  Vitals reviewed.         Assessment & Plan:

## 2014-04-22 NOTE — Progress Notes (Signed)
Pre visit review using our clinic review tool, if applicable. No additional management support is needed unless otherwise documented below in the visit note. 

## 2014-04-22 NOTE — Assessment & Plan Note (Signed)
Chronic problem for pt.  UTD on eye exam- plans to schedule his upcoming appt.  Pt has started losing weight- applauded his efforts.  On ARB for renal protection.  Check labs.  Adjust meds prn

## 2014-04-22 NOTE — Patient Instructions (Signed)
Follow up in 3-4 months to recheck diabetes and cholesterol We'll notify you of your lab results and make any changes if needed Keep up the good work on healthy diet and regular exercise Call with any questions or concerns Happy Spring!  Enjoy the beach!!!

## 2014-05-06 DIAGNOSIS — M545 Low back pain: Secondary | ICD-10-CM | POA: Diagnosis not present

## 2014-05-22 DIAGNOSIS — Z96652 Presence of left artificial knee joint: Secondary | ICD-10-CM | POA: Diagnosis not present

## 2014-05-22 DIAGNOSIS — Z471 Aftercare following joint replacement surgery: Secondary | ICD-10-CM | POA: Diagnosis not present

## 2014-05-22 DIAGNOSIS — M5431 Sciatica, right side: Secondary | ICD-10-CM | POA: Diagnosis not present

## 2014-07-11 DIAGNOSIS — H43393 Other vitreous opacities, bilateral: Secondary | ICD-10-CM | POA: Diagnosis not present

## 2014-07-11 DIAGNOSIS — E119 Type 2 diabetes mellitus without complications: Secondary | ICD-10-CM | POA: Diagnosis not present

## 2014-07-11 LAB — HM DIABETES EYE EXAM

## 2014-07-15 DIAGNOSIS — R109 Unspecified abdominal pain: Secondary | ICD-10-CM | POA: Diagnosis not present

## 2014-07-15 DIAGNOSIS — N390 Urinary tract infection, site not specified: Secondary | ICD-10-CM | POA: Diagnosis not present

## 2014-07-25 ENCOUNTER — Encounter: Payer: Self-pay | Admitting: General Practice

## 2014-08-01 ENCOUNTER — Encounter: Payer: Self-pay | Admitting: Internal Medicine

## 2014-08-05 ENCOUNTER — Ambulatory Visit: Payer: PRIVATE HEALTH INSURANCE | Admitting: Family Medicine

## 2014-08-07 ENCOUNTER — Encounter: Payer: Self-pay | Admitting: Family Medicine

## 2014-08-07 ENCOUNTER — Encounter: Payer: Self-pay | Admitting: General Practice

## 2014-08-07 ENCOUNTER — Ambulatory Visit (INDEPENDENT_AMBULATORY_CARE_PROVIDER_SITE_OTHER): Payer: Medicare Other | Admitting: Family Medicine

## 2014-08-07 VITALS — BP 130/84 | HR 90 | Temp 98.1°F | Resp 16 | Ht 71.0 in | Wt 248.2 lb

## 2014-08-07 DIAGNOSIS — E785 Hyperlipidemia, unspecified: Secondary | ICD-10-CM | POA: Diagnosis not present

## 2014-08-07 DIAGNOSIS — E119 Type 2 diabetes mellitus without complications: Secondary | ICD-10-CM

## 2014-08-07 DIAGNOSIS — S46319A Strain of muscle, fascia and tendon of triceps, unspecified arm, initial encounter: Secondary | ICD-10-CM | POA: Insufficient documentation

## 2014-08-07 DIAGNOSIS — I10 Essential (primary) hypertension: Secondary | ICD-10-CM | POA: Diagnosis not present

## 2014-08-07 DIAGNOSIS — S46312A Strain of muscle, fascia and tendon of triceps, left arm, initial encounter: Secondary | ICD-10-CM | POA: Diagnosis not present

## 2014-08-07 LAB — CBC WITH DIFFERENTIAL/PLATELET
BASOS ABS: 0 10*3/uL (ref 0.0–0.1)
BASOS PCT: 0.3 % (ref 0.0–3.0)
Eosinophils Absolute: 0.3 10*3/uL (ref 0.0–0.7)
Eosinophils Relative: 3 % (ref 0.0–5.0)
HCT: 49 % (ref 39.0–52.0)
HEMOGLOBIN: 16.4 g/dL (ref 13.0–17.0)
Lymphocytes Relative: 25 % (ref 12.0–46.0)
Lymphs Abs: 2.3 10*3/uL (ref 0.7–4.0)
MCHC: 33.5 g/dL (ref 30.0–36.0)
MCV: 92.6 fl (ref 78.0–100.0)
MONOS PCT: 10.4 % (ref 3.0–12.0)
Monocytes Absolute: 1 10*3/uL (ref 0.1–1.0)
Neutro Abs: 5.6 10*3/uL (ref 1.4–7.7)
Neutrophils Relative %: 61.3 % (ref 43.0–77.0)
PLATELETS: 283 10*3/uL (ref 150.0–400.0)
RBC: 5.29 Mil/uL (ref 4.22–5.81)
RDW: 13.2 % (ref 11.5–15.5)
WBC: 9.1 10*3/uL (ref 4.0–10.5)

## 2014-08-07 LAB — HEPATIC FUNCTION PANEL
ALBUMIN: 4.3 g/dL (ref 3.5–5.2)
ALT: 53 U/L (ref 0–53)
AST: 30 U/L (ref 0–37)
Alkaline Phosphatase: 42 U/L (ref 39–117)
Bilirubin, Direct: 0.1 mg/dL (ref 0.0–0.3)
Total Bilirubin: 0.6 mg/dL (ref 0.2–1.2)
Total Protein: 7.1 g/dL (ref 6.0–8.3)

## 2014-08-07 LAB — LIPID PANEL
Cholesterol: 108 mg/dL (ref 0–200)
HDL: 35.9 mg/dL — AB (ref >39.00)
LDL CALC: 45 mg/dL (ref 0–99)
NONHDL: 71.74
TRIGLYCERIDES: 132 mg/dL (ref 0.0–149.0)
Total CHOL/HDL Ratio: 3
VLDL: 26.4 mg/dL (ref 0.0–40.0)

## 2014-08-07 LAB — BASIC METABOLIC PANEL
BUN: 16 mg/dL (ref 6–23)
CO2: 27 meq/L (ref 19–32)
CREATININE: 1.05 mg/dL (ref 0.40–1.50)
Calcium: 9.5 mg/dL (ref 8.4–10.5)
Chloride: 106 mEq/L (ref 96–112)
GFR: 74.3 mL/min (ref >60.00)
Glucose, Bld: 130 mg/dL — ABNORMAL HIGH (ref 70–99)
Potassium: 4.3 mEq/L (ref 3.5–5.1)
Sodium: 140 mEq/L (ref 135–145)

## 2014-08-07 LAB — TSH: TSH: 0.86 u[IU]/mL (ref 0.35–4.50)

## 2014-08-07 LAB — HEMOGLOBIN A1C: Hgb A1c MFr Bld: 6.2 % (ref 4.6–6.5)

## 2014-08-07 NOTE — Assessment & Plan Note (Signed)
Chronic problem.  Well controlled.  Asymptomatic.  Check labs.  No anticipated med changes.  Will follow. 

## 2014-08-07 NOTE — Assessment & Plan Note (Signed)
New.  Pt w/ TTP over belly of L triceps.  No weakness or numbness of L arm on hx or exam.  Pt is overly concerned about cardiac etiology given family hx- reassurance provided that isolated tricep pain is not consistent w/ cardiac etiology.  Reviewed supportive care and red flags that should prompt return.  Pt expressed understanding and is in agreement w/ plan.

## 2014-08-07 NOTE — Progress Notes (Signed)
   Subjective:    Patient ID: [name], male    DOB: [birth year], 69 y.o.   MRN: [id number]  HPI HTN- chronic problem.  Adequate control on Losartan.  Denies CP.  Some SOB w/ exertion in the heat.  No HAs, visual changes, edema.  Hyperlipidemia- chronic problem.  On Simvastatin.  Denies abd pain, N/V.  DM- chronic problem, on Metformin, Amaryl.  UTD on eye exam and foot exam.  On ARB for renal protection.  Not checking sugars.  Denies symptomatic lows.  Denies numbness/tingling of hands/feet.  Exercising regularly.   L triceps pain- described as an intermittent ache that started within the last week.  Has hx of similar.  No known injury.   Review of Systems For ROS see HPI     Objective:   Physical Exam  Constitutional: He is oriented to person, place, and time. He appears well-developed and well-nourished. No distress.  HENT:  Head: Normocephalic and atraumatic.  Eyes: Conjunctivae and EOM are normal. Pupils are equal, round, and reactive to light.  Neck: Normal range of motion. Neck supple. No thyromegaly present.  Cardiovascular: Normal rate, regular rhythm, normal heart sounds and intact distal pulses.   No murmur heard. Pulmonary/Chest: Effort normal and breath sounds normal. No respiratory distress.  Abdominal: Soft. Bowel sounds are normal. He exhibits no distension.  Musculoskeletal: He exhibits tenderness (TTP over belly of L tricep). He exhibits no edema.  Lymphadenopathy:    He has no cervical adenopathy.  Neurological: He is alert and oriented to person, place, and time. No cranial nerve deficit.  Skin: Skin is warm and dry.  Psychiatric: He has a normal mood and affect. His behavior is normal.  Vitals reviewed.         Assessment & Plan:

## 2014-08-07 NOTE — Assessment & Plan Note (Signed)
Chronic problem.  UTD on eye exam, foot exam.  On ARB for renal protection.  Denies symptomatic lows.  Exercising regularly.  Check labs.  Adjust meds prn

## 2014-08-07 NOTE — Progress Notes (Signed)
Pre visit review using our clinic review tool, if applicable. No additional management support is needed unless otherwise documented below in the visit note. 

## 2014-08-07 NOTE — Patient Instructions (Signed)
Follow up in 3-4 months to recheck diabetes We'll notify you of your lab results and make any changes if needed Your L arm pain is consistent w/ a muscle strain- tylenol as needed, alternate heat/ice or whichever feels better Keep up the good work!  You look great! Call with any questions or concerns Enjoy the rest of your summer!!!

## 2014-08-26 ENCOUNTER — Encounter: Payer: Self-pay | Admitting: Physician Assistant

## 2014-08-26 ENCOUNTER — Ambulatory Visit (INDEPENDENT_AMBULATORY_CARE_PROVIDER_SITE_OTHER): Payer: Medicare Other | Admitting: Physician Assistant

## 2014-08-26 VITALS — BP 126/86 | HR 89 | Temp 98.4°F | Resp 16 | Ht 70.0 in | Wt 247.1 lb

## 2014-08-26 DIAGNOSIS — IMO0002 Reserved for concepts with insufficient information to code with codable children: Secondary | ICD-10-CM | POA: Insufficient documentation

## 2014-08-26 DIAGNOSIS — L03012 Cellulitis of left finger: Secondary | ICD-10-CM | POA: Diagnosis not present

## 2014-08-26 MED ORDER — CEPHALEXIN 500 MG PO CAPS: 500.0000 mg | ORAL_CAPSULE | Freq: Three times a day (TID) | ORAL | Status: DC

## 2014-08-26 NOTE — Assessment & Plan Note (Signed)
Hygiene measures/soaks discussed. Topical Bacitracin. Rx Keflex to take as directed. Follow-up 1 week giving diabetes. Return immediately if anything worsens.

## 2014-08-26 NOTE — Progress Notes (Signed)
Pre visit review using our clinic review tool, if applicable. No additional management support is needed unless otherwise documented below in the visit note/SLS  

## 2014-08-26 NOTE — Progress Notes (Signed)
Patient presents to clinic today c/o pain, redness and swelling of left thumb over the past several days after sustaining a scratch. Denies fever, chills. Notes drainage from site. Has been soaking in betadine.  Past Medical History  Diagnosis Date  . Anxiety   . Low back pain syndrome   . DJD (degenerative joint disease)   . Nephrolithiasis   . BPH (benign prostatic hypertrophy) with urinary obstruction   . Cholelithiasis   . Hemorrhoids   . History of colonic polyps   . Diverticulosis of colon   . GERD (gastroesophageal reflux disease)   . Diabetes mellitus   . Hyperlipidemia   . History of DVT (deep vein thrombosis)   . Venous insufficiency   . Varicose veins of lower extremities   . Hypertension, benign   . pulmonary nodule, left lower lobe   . Clotting disorder   . History of kidney stones     Current Outpatient Prescriptions on File Prior to Visit  Medication Sig Dispense Refill  . aspirin EC 81 MG tablet Take 81 mg by mouth daily.    . cetirizine (ZYRTEC) 10 MG tablet Take 10 mg by mouth daily.    . diclofenac (VOLTAREN) 75 MG EC tablet TAKE 1 TABLET BY MOUTH TWICE A DAY WITH A MEAL 180 tablet 1  . glimepiride (AMARYL) 2 MG tablet TAKE 1 TABLET (2 MG TOTAL) BY MOUTH DAILY BEFORE BREAKFAST. 90 tablet 1  . losartan (COZAAR) 100 MG tablet Take 1 tablet (100 mg total) by mouth daily. 90 tablet 1  . metFORMIN (GLUCOPHAGE) 500 MG tablet TAKE 1 TABLET (500 MG TOTAL) BY MOUTH 2 (TWO) TIMES DAILY WITH A MEAL. 180 tablet 1  . multivitamin (THERAGRAN) per tablet Take 1 tablet by mouth daily.     . pantoprazole (PROTONIX) 40 MG tablet TAKE 1 TABLET (40 MG TOTAL) BY MOUTH 2 (TWO) TIMES DAILY. 180 tablet 1  . ranitidine (ZANTAC) 150 MG capsule Take 150 mg by mouth at bedtime.     [name] RAPAFLO 8 MG CAPS capsule Take 8 mg by mouth daily.  5  . simvastatin (ZOCOR) 40 MG tablet TAKE 1 TABLET (40 MG TOTAL) BY MOUTH AT BEDTIME. 90 tablet 1   No current facility-administered medications  on file prior to visit.    Allergies  Allergen Reactions  . Ciprofloxacin     REACTION: rash  . Niacin     REACTION: hypertension, shaky    Family History  Problem Relation Age of Onset  . Gout Father   . Coronary artery disease Father 82    died of massive MI age 55  . Heart attack Father   . CAD Brother 45    Social History   Social History  . Marital Status: Married    Spouse Name: N/A  . Number of Children: 2  . Years of Education: N/A   Social History Main Topics  . Smoking status: Never Smoker   . Smokeless tobacco: Never Used  . Alcohol Use: No     Comment: social  use  . Drug Use: No  . Sexual Activity: Not Asked   Other Topics Concern  . None   Social History Narrative   Retired.    Review of Systems - See HPI.  All other ROS are negative.  BP 126/86 mmHg  Pulse 89  Temp(Src) 98.4 F (36.9 C) (Oral)  Resp 16  Ht 5\' 10"  (1.778 m)  Wt 247 lb 2 oz (112.095 kg)  BMI 35.46 kg/m2  SpO2 98%  Physical Exam  Constitutional: He is well-developed, well-nourished, and in no distress.  HENT:  Head: Normocephalic and atraumatic.  Eyes: Conjunctivae are normal.  Cardiovascular: Normal rate, regular rhythm, normal heart sounds and intact distal pulses.   Pulmonary/Chest: Effort normal.  Skin: Skin is warm and dry.     Vitals reviewed.   Recent Results (from the past 2160 hour(s))  HM DIABETES EYE EXAM     Status: None   Collection Time: 07/11/14 12:00 AM  Result Value Ref Range   HM Diabetic Eye Exam No Retinopathy No Retinopathy  HM DIABETES EYE EXAM     Status: None   Collection Time: 07/11/14 12:00 AM  Result Value Ref Range   HM Diabetic Eye Exam No Retinopathy No Retinopathy  Hemoglobin A1c     Status: None   Collection Time: 08/07/14 10:19 AM  Result Value Ref Range   Hgb A1c MFr Bld 6.2 4.6 - 6.5 %    Comment: Glycemic Control Guidelines for People with Diabetes:Non Diabetic:  <6%Goal of Therapy: <7%Additional Action Suggested:  >8%     Lipid panel     Status: Abnormal   Collection Time: 08/07/14 10:19 AM  Result Value Ref Range   Cholesterol 108 0 - 200 mg/dL    Comment: ATP III Classification       Desirable:  < 200 mg/dL               Borderline High:  200 - 239 mg/dL          High:  > = 240 mg/dL   Triglycerides 132.0 0.0 - 149.0 mg/dL    Comment: Normal:  <150 mg/dLBorderline High:  150 - 199 mg/dL   HDL 35.90 (L) >39.00 mg/dL   VLDL 26.4 0.0 - 40.0 mg/dL   LDL Cholesterol 45 0 - 99 mg/dL   Total CHOL/HDL Ratio 3     Comment:                Men          Women1/2 Average Risk     3.4          3.3Average Risk          5.0          4.42X Average Risk          9.6          7.13X Average Risk          15.0          11.0                       NonHDL 71.74     Comment: NOTE:  Non-HDL goal should be 30 mg/dL higher than patient's LDL goal (i.e. LDL goal of < 70 mg/dL, would have non-HDL goal of < 100 mg/dL)  Basic metabolic panel     Status: Abnormal   Collection Time: 08/07/14 10:19 AM  Result Value Ref Range   Sodium 140 135 - 145 mEq/L   Potassium 4.3 3.5 - 5.1 mEq/L   Chloride 106 96 - 112 mEq/L   CO2 27 19 - 32 mEq/L   Glucose, Bld 130 (H) 70 - 99 mg/dL   BUN 16 6 - 23 mg/dL   Creatinine, Ser 1.05 0.40 - 1.50 mg/dL   Calcium 9.5 8.4 - 10.5 mg/dL   GFR 74.30 >60.00 mL/min  Hepatic function panel  Status: None   Collection Time: 08/07/14 10:19 AM  Result Value Ref Range   Total Bilirubin 0.6 0.2 - 1.2 mg/dL   Bilirubin, Direct 0.1 0.0 - 0.3 mg/dL   Alkaline Phosphatase 42 39 - 117 U/L   AST 30 0 - 37 U/L   ALT 53 0 - 53 U/L   Total Protein 7.1 6.0 - 8.3 g/dL   Albumin 4.3 3.5 - 5.2 g/dL  TSH     Status: None   Collection Time: 08/07/14 10:19 AM  Result Value Ref Range   TSH 0.86 0.35 - 4.50 uIU/mL  CBC with Differential/Platelet     Status: None   Collection Time: 08/07/14 10:19 AM  Result Value Ref Range   WBC 9.1 4.0 - 10.5 K/uL   RBC 5.29 4.22 - 5.81 Mil/uL   Hemoglobin 16.4 13.0 - 17.0 g/dL    HCT 49.0 39.0 - 52.0 %   MCV 92.6 78.0 - 100.0 fl   MCHC 33.5 30.0 - 36.0 g/dL   RDW 13.2 11.5 - 15.5 %   Platelets 283.0 150.0 - 400.0 K/uL   Neutrophils Relative % 61.3 43.0 - 77.0 %   Lymphocytes Relative 25.0 12.0 - 46.0 %   Monocytes Relative 10.4 3.0 - 12.0 %   Eosinophils Relative 3.0 0.0 - 5.0 %   Basophils Relative 0.3 0.0 - 3.0 %   Neutro Abs 5.6 1.4 - 7.7 K/uL   Lymphs Abs 2.3 0.7 - 4.0 K/uL   Monocytes Absolute 1.0 0.1 - 1.0 K/uL   Eosinophils Absolute 0.3 0.0 - 0.7 K/uL   Basophils Absolute 0.0 0.0 - 0.1 K/uL    Assessment/Plan: Paronychia Hygiene measures/soaks discussed. Topical Bacitracin. Rx Keflex to take as directed. Follow-up 1 week giving diabetes. Return immediately if anything worsens.

## 2014-08-26 NOTE — Patient Instructions (Signed)
Please take antibiotic as directed with food. Continue soaks -- peroxide soaks are good option as well. Use Bacitracin ointment topically twice daily. Keep area clean and dry.  Follow-up 1 week. If anything worsens or you develop a fever, return immediately or go to an ER for assessment.

## 2014-09-02 ENCOUNTER — Ambulatory Visit: Payer: PRIVATE HEALTH INSURANCE | Admitting: Physician Assistant

## 2014-09-04 ENCOUNTER — Encounter: Payer: Self-pay | Admitting: Physician Assistant

## 2014-09-04 ENCOUNTER — Ambulatory Visit (INDEPENDENT_AMBULATORY_CARE_PROVIDER_SITE_OTHER): Payer: Medicare Other | Admitting: Physician Assistant

## 2014-09-04 VITALS — BP 122/78 | HR 87 | Temp 97.6°F | Resp 16 | Ht 70.0 in | Wt 246.4 lb

## 2014-09-04 DIAGNOSIS — L03012 Cellulitis of left finger: Secondary | ICD-10-CM

## 2014-09-04 MED ORDER — MUPIROCIN CALCIUM 2 % EX CREA: 1.0000 "application " | TOPICAL_CREAM | Freq: Every day | CUTANEOUS | Status: DC

## 2014-09-04 NOTE — Assessment & Plan Note (Signed)
Resolving. Stop soaks as the frequency is causing maceration and delaying healing of skin. Supportive measures reviewed. Rx Mupirocin once daily over next 5 days.

## 2014-09-04 NOTE — Progress Notes (Signed)
Pre visit review using our clinic review tool, if applicable. No additional management support is needed unless otherwise documented below in the visit note/SLS  

## 2014-09-04 NOTE — Patient Instructions (Signed)
The area of concern looks much better after the antibiotic.  You can stop the soaks as they are macerating the skin a bit. Keep area clean and dry. Use the Mupirocin ointment once daily. The little "lump" should dry up and heal. If this persists, I would recommend a Dermatologist take a peek at it.

## 2014-09-04 NOTE — Progress Notes (Signed)
Patient presents to clinic today for follow-up of paronychia of left thumb after being treated with Keflex. Endorses taking medication as directed with marked improvement of symptoms. No residual warmth or drainage. Still with a "lump" around cuticle but has reduced in size. Denies fever, chills.  Past Medical History  Diagnosis Date  . Anxiety   . Low back pain syndrome   . DJD (degenerative joint disease)   . Nephrolithiasis   . BPH (benign prostatic hypertrophy) with urinary obstruction   . Cholelithiasis   . Hemorrhoids   . History of colonic polyps   . Diverticulosis of colon   . GERD (gastroesophageal reflux disease)   . Diabetes mellitus   . Hyperlipidemia   . History of DVT (deep vein thrombosis)   . Venous insufficiency   . Varicose veins of lower extremities   . Hypertension, benign   . pulmonary nodule, left lower lobe   . Clotting disorder   . History of kidney stones     Current Outpatient Prescriptions on File Prior to Visit  Medication Sig Dispense Refill  . aspirin EC 81 MG tablet Take 81 mg by mouth daily.    . cetirizine (ZYRTEC) 10 MG tablet Take 10 mg by mouth daily.    . diclofenac (VOLTAREN) 75 MG EC tablet TAKE 1 TABLET BY MOUTH TWICE A DAY WITH A MEAL 180 tablet 1  . glimepiride (AMARYL) 2 MG tablet TAKE 1 TABLET (2 MG TOTAL) BY MOUTH DAILY BEFORE BREAKFAST. 90 tablet 1  . losartan (COZAAR) 100 MG tablet Take 1 tablet (100 mg total) by mouth daily. 90 tablet 1  . metFORMIN (GLUCOPHAGE) 500 MG tablet TAKE 1 TABLET (500 MG TOTAL) BY MOUTH 2 (TWO) TIMES DAILY WITH A MEAL. 180 tablet 1  . multivitamin (THERAGRAN) per tablet Take 1 tablet by mouth daily.     . pantoprazole (PROTONIX) 40 MG tablet TAKE 1 TABLET (40 MG TOTAL) BY MOUTH 2 (TWO) TIMES DAILY. 180 tablet 1  . ranitidine (ZANTAC) 150 MG capsule Take 150 mg by mouth at bedtime.     [name] RAPAFLO 8 MG CAPS capsule Take 8 mg by mouth daily.  5  . simvastatin (ZOCOR) 40 MG tablet TAKE 1 TABLET (40 MG  TOTAL) BY MOUTH AT BEDTIME. 90 tablet 1   No current facility-administered medications on file prior to visit.    Allergies  Allergen Reactions  . Ciprofloxacin     REACTION: rash  . Niacin     REACTION: hypertension, shaky    Family History  Problem Relation Age of Onset  . Gout Father   . Coronary artery disease Father 70    died of massive MI age 66  . Heart attack Father   . CAD Brother 69    Social History   Social History  . Marital Status: Married    Spouse Name: N/A  . Number of Children: 2  . Years of Education: N/A   Social History Main Topics  . Smoking status: Never Smoker   . Smokeless tobacco: Never Used  . Alcohol Use: No     Comment: social  use  . Drug Use: No  . Sexual Activity: Not Asked   Other Topics Concern  . None   Social History Narrative   Retired.    Review of Systems - See HPI.  All other ROS are negative.  BP 122/78 mmHg  Pulse 87  Temp(Src) 97.6 F (36.4 C) (Oral)  Resp 16  Ht 5'  10" (1.778 m)  Wt 246 lb 6 oz (111.755 kg)  BMI 35.35 kg/m2  SpO2 98%  Physical Exam  Constitutional: He is oriented to person, place, and time and well-developed, well-nourished, and in no distress.  Cardiovascular: Normal rate and regular rhythm.   Pulmonary/Chest: Effort normal.  Neurological: He is alert and oriented to person, place, and time.  Skin: Skin is warm and dry.     Vitals reviewed.   Recent Results (from the past 2160 hour(s))  HM DIABETES EYE EXAM     Status: None   Collection Time: 07/11/14 12:00 AM  Result Value Ref Range   HM Diabetic Eye Exam No Retinopathy No Retinopathy  HM DIABETES EYE EXAM     Status: None   Collection Time: 07/11/14 12:00 AM  Result Value Ref Range   HM Diabetic Eye Exam No Retinopathy No Retinopathy  Hemoglobin A1c     Status: None   Collection Time: 08/07/14 10:19 AM  Result Value Ref Range   Hgb A1c MFr Bld 6.2 4.6 - 6.5 %    Comment: Glycemic Control Guidelines for People with  Diabetes:Non Diabetic:  <6%Goal of Therapy: <7%Additional Action Suggested:  >8%   Lipid panel     Status: Abnormal   Collection Time: 08/07/14 10:19 AM  Result Value Ref Range   Cholesterol 108 0 - 200 mg/dL    Comment: ATP III Classification       Desirable:  < 200 mg/dL               Borderline High:  200 - 239 mg/dL          High:  > = 240 mg/dL   Triglycerides 132.0 0.0 - 149.0 mg/dL    Comment: Normal:  <150 mg/dLBorderline High:  150 - 199 mg/dL   HDL 35.90 (L) >39.00 mg/dL   VLDL 26.4 0.0 - 40.0 mg/dL   LDL Cholesterol 45 0 - 99 mg/dL   Total CHOL/HDL Ratio 3     Comment:                Men          Women1/2 Average Risk     3.4          3.3Average Risk          5.0          4.42X Average Risk          9.6          7.13X Average Risk          15.0          11.0                       NonHDL 71.74     Comment: NOTE:  Non-HDL goal should be 30 mg/dL higher than patient's LDL goal (i.e. LDL goal of < 70 mg/dL, would have non-HDL goal of < 100 mg/dL)  Basic metabolic panel     Status: Abnormal   Collection Time: 08/07/14 10:19 AM  Result Value Ref Range   Sodium 140 135 - 145 mEq/L   Potassium 4.3 3.5 - 5.1 mEq/L   Chloride 106 96 - 112 mEq/L   CO2 27 19 - 32 mEq/L   Glucose, Bld 130 (H) 70 - 99 mg/dL   BUN 16 6 - 23 mg/dL   Creatinine, Ser 1.05 0.40 - 1.50 mg/dL   Calcium 9.5 8.4 - 10.5 mg/dL  GFR 74.30 >60.00 mL/min  Hepatic function panel     Status: None   Collection Time: 08/07/14 10:19 AM  Result Value Ref Range   Total Bilirubin 0.6 0.2 - 1.2 mg/dL   Bilirubin, Direct 0.1 0.0 - 0.3 mg/dL   Alkaline Phosphatase 42 39 - 117 U/L   AST 30 0 - 37 U/L   ALT 53 0 - 53 U/L   Total Protein 7.1 6.0 - 8.3 g/dL   Albumin 4.3 3.5 - 5.2 g/dL  TSH     Status: None   Collection Time: 08/07/14 10:19 AM  Result Value Ref Range   TSH 0.86 0.35 - 4.50 uIU/mL  CBC with Differential/Platelet     Status: None   Collection Time: 08/07/14 10:19 AM  Result Value Ref Range   WBC 9.1 4.0 -  10.5 K/uL   RBC 5.29 4.22 - 5.81 Mil/uL   Hemoglobin 16.4 13.0 - 17.0 g/dL   HCT 49.0 39.0 - 52.0 %   MCV 92.6 78.0 - 100.0 fl   MCHC 33.5 30.0 - 36.0 g/dL   RDW 13.2 11.5 - 15.5 %   Platelets 283.0 150.0 - 400.0 K/uL   Neutrophils Relative % 61.3 43.0 - 77.0 %   Lymphocytes Relative 25.0 12.0 - 46.0 %   Monocytes Relative 10.4 3.0 - 12.0 %   Eosinophils Relative 3.0 0.0 - 5.0 %   Basophils Relative 0.3 0.0 - 3.0 %   Neutro Abs 5.6 1.4 - 7.7 K/uL   Lymphs Abs 2.3 0.7 - 4.0 K/uL   Monocytes Absolute 1.0 0.1 - 1.0 K/uL   Eosinophils Absolute 0.3 0.0 - 0.7 K/uL   Basophils Absolute 0.0 0.0 - 0.1 K/uL    Assessment/Plan: Paronychia Resolving. Stop soaks as the frequency is causing maceration and delaying healing of skin. Supportive measures reviewed. Rx Mupirocin once daily over next 5 days.

## 2014-09-18 DIAGNOSIS — R972 Elevated prostate specific antigen [PSA]: Secondary | ICD-10-CM | POA: Diagnosis not present

## 2014-09-21 ENCOUNTER — Other Ambulatory Visit: Payer: Self-pay | Admitting: Family Medicine

## 2014-09-23 NOTE — Telephone Encounter (Signed)
Medication filled to pharmacy as requested.   

## 2014-09-25 ENCOUNTER — Other Ambulatory Visit (HOSPITAL_COMMUNITY): Payer: Self-pay | Admitting: Urology

## 2014-09-25 DIAGNOSIS — R972 Elevated prostate specific antigen [PSA]: Secondary | ICD-10-CM

## 2014-09-25 DIAGNOSIS — N200 Calculus of kidney: Secondary | ICD-10-CM | POA: Diagnosis not present

## 2014-09-25 DIAGNOSIS — N390 Urinary tract infection, site not specified: Secondary | ICD-10-CM | POA: Diagnosis not present

## 2014-09-27 ENCOUNTER — Ambulatory Visit (INDEPENDENT_AMBULATORY_CARE_PROVIDER_SITE_OTHER): Payer: Medicare Other | Admitting: *Deleted

## 2014-09-27 DIAGNOSIS — Z23 Encounter for immunization: Secondary | ICD-10-CM

## 2014-09-27 NOTE — Progress Notes (Signed)
Pre visit review using our clinic review tool, if applicable. No additional management support is needed unless otherwise documented below in the visit note. 

## 2014-09-30 DIAGNOSIS — N200 Calculus of kidney: Secondary | ICD-10-CM | POA: Diagnosis not present

## 2014-10-15 DIAGNOSIS — N200 Calculus of kidney: Secondary | ICD-10-CM | POA: Diagnosis not present

## 2014-10-17 ENCOUNTER — Ambulatory Visit (HOSPITAL_COMMUNITY)
Admission: RE | Admit: 2014-10-17 | Discharge: 2014-10-17 | Disposition: A | Discharge status: Home / Self Care | Payer: Medicare Other | Source: Ambulatory Visit | Attending: Urology | Admitting: Urology

## 2014-10-17 DIAGNOSIS — R972 Elevated prostate specific antigen [PSA]: Secondary | ICD-10-CM | POA: Diagnosis not present

## 2014-10-17 LAB — POCT I-STAT CREATININE: CREATININE: 1.2 mg/dL (ref 0.61–1.24)

## 2014-10-17 MED ORDER — GADOBENATE DIMEGLUMINE 529 MG/ML IV SOLN
20.0000 mL | Freq: Once | INTRAVENOUS | Status: AC | PRN
  Administered 2014-10-17: 20 mL via INTRAVENOUS

## 2014-10-26 ENCOUNTER — Other Ambulatory Visit: Payer: Self-pay | Admitting: Family Medicine

## 2014-10-28 ENCOUNTER — Other Ambulatory Visit: Payer: Self-pay | Admitting: Family Medicine

## 2014-10-28 DIAGNOSIS — N200 Calculus of kidney: Secondary | ICD-10-CM | POA: Diagnosis not present

## 2014-10-28 NOTE — Telephone Encounter (Signed)
Medication filled to pharmacy as requested.   

## 2014-10-30 ENCOUNTER — Other Ambulatory Visit: Payer: Self-pay | Admitting: Family Medicine

## 2014-10-30 NOTE — Telephone Encounter (Signed)
Medication filled to pharmacy as requested.   

## 2014-11-18 ENCOUNTER — Other Ambulatory Visit: Payer: Self-pay | Admitting: Family Medicine

## 2014-12-24 ENCOUNTER — Telehealth: Payer: Self-pay | Admitting: Behavioral Health

## 2014-12-24 NOTE — Telephone Encounter (Signed)
Unable to reach patient at time of Pre-Visit Call.  Left message for patient to return call when available.    

## 2014-12-25 ENCOUNTER — Encounter: Payer: Self-pay | Admitting: Family Medicine

## 2014-12-25 ENCOUNTER — Ambulatory Visit (INDEPENDENT_AMBULATORY_CARE_PROVIDER_SITE_OTHER): Payer: Medicare Other | Admitting: Family Medicine

## 2014-12-25 ENCOUNTER — Encounter: Payer: Self-pay | Admitting: General Practice

## 2014-12-25 VITALS — BP 134/80 | HR 83 | Temp 97.9°F | Resp 16 | Ht 70.0 in | Wt 249.4 lb

## 2014-12-25 DIAGNOSIS — E785 Hyperlipidemia, unspecified: Secondary | ICD-10-CM

## 2014-12-25 DIAGNOSIS — Z0000 Encounter for general adult medical examination without abnormal findings: Secondary | ICD-10-CM | POA: Diagnosis not present

## 2014-12-25 DIAGNOSIS — E119 Type 2 diabetes mellitus without complications: Secondary | ICD-10-CM | POA: Diagnosis not present

## 2014-12-25 DIAGNOSIS — I10 Essential (primary) hypertension: Secondary | ICD-10-CM | POA: Diagnosis not present

## 2014-12-25 LAB — BASIC METABOLIC PANEL
BUN: 23 mg/dL (ref 6–23)
CHLORIDE: 107 meq/L (ref 96–112)
CO2: 27 meq/L (ref 19–32)
Calcium: 9.2 mg/dL (ref 8.4–10.5)
Creatinine, Ser: 1.07 mg/dL (ref 0.40–1.50)
GFR: 72.62 mL/min (ref >60.00)
Glucose, Bld: 132 mg/dL — ABNORMAL HIGH (ref 70–99)
POTASSIUM: 4.5 meq/L (ref 3.5–5.1)
Sodium: 142 mEq/L (ref 135–145)

## 2014-12-25 LAB — CBC WITH DIFFERENTIAL/PLATELET
BASOS PCT: 0.2 % (ref 0.0–3.0)
Basophils Absolute: 0 10*3/uL (ref 0.0–0.1)
EOS ABS: 0.3 10*3/uL (ref 0.0–0.7)
EOS PCT: 3.7 % (ref 0.0–5.0)
HCT: 46.5 % (ref 39.0–52.0)
HEMOGLOBIN: 15.5 g/dL (ref 13.0–17.0)
LYMPHS ABS: 2 10*3/uL (ref 0.7–4.0)
Lymphocytes Relative: 22.8 % (ref 12.0–46.0)
MCHC: 33.4 g/dL (ref 30.0–36.0)
MCV: 90.9 fl (ref 78.0–100.0)
MONO ABS: 0.9 10*3/uL (ref 0.1–1.0)
Monocytes Relative: 9.9 % (ref 3.0–12.0)
NEUTROS ABS: 5.5 10*3/uL (ref 1.4–7.7)
NEUTROS PCT: 63.4 % (ref 43.0–77.0)
Platelets: 295 10*3/uL (ref 150.0–400.0)
RBC: 5.11 Mil/uL (ref 4.22–5.81)
RDW: 13.7 % (ref 11.5–15.5)
WBC: 8.6 10*3/uL (ref 4.0–10.5)

## 2014-12-25 LAB — HEMOGLOBIN A1C: HEMOGLOBIN A1C: 6.4 % (ref 4.6–6.5)

## 2014-12-25 LAB — LIPID PANEL
CHOL/HDL RATIO: 3
Cholesterol: 112 mg/dL (ref 0–200)
HDL: 33.9 mg/dL — AB (ref >39.00)
LDL Cholesterol: 54 mg/dL (ref 0–99)
NONHDL: 78.23
Triglycerides: 119 mg/dL (ref 0.0–149.0)
VLDL: 23.8 mg/dL (ref 0.0–40.0)

## 2014-12-25 LAB — HEPATIC FUNCTION PANEL
ALK PHOS: 43 U/L (ref 39–117)
ALT: 40 U/L (ref 0–53)
AST: 24 U/L (ref 0–37)
Albumin: 4 g/dL (ref 3.5–5.2)
BILIRUBIN DIRECT: 0.1 mg/dL (ref 0.0–0.3)
BILIRUBIN TOTAL: 0.5 mg/dL (ref 0.2–1.2)
Total Protein: 6.9 g/dL (ref 6.0–8.3)

## 2014-12-25 LAB — TSH: TSH: 0.94 u[IU]/mL (ref 0.35–4.50)

## 2014-12-25 NOTE — Patient Instructions (Signed)
Follow up in 3-4 months to recheck diabetes We'll notify you of your lab results and make any changes if needed Keep up the good work on healthy diet and regular exercise- you can do it!!! You are up to date on foot exam, eye exam, colonoscopy (due 2020), and immunizations Call with any questions or concerns If you want to join [place] at the new [place] office, any scheduled appointments will automatically transfer and we will see you at [address] [place] [address] [place], [place] [address] (OPENING 01/07/15) Merry Christmas!!!

## 2014-12-25 NOTE — Assessment & Plan Note (Signed)
Chronic problem.  Adequate control.  Asymptomatic.  Check labs.  No anticipated med changes.  Will follow. 

## 2014-12-25 NOTE — Assessment & Plan Note (Signed)
Pt's PE unchanged from previous.  UTD on colonoscopy, urology, immunizations.  Written screening schedule updated and given to pt.  Check labs.  Anticipatory guidance provided.  

## 2014-12-25 NOTE — Progress Notes (Signed)
   Subjective:    Patient ID: [name], male    DOB: Dec 03, 1945, 69 y.o.   MRN: [id number]  HPI Here today for CPE.  Risk Factors: HTN- chronic problem, on Losartan w/ good control.  Denies CP, SOB, HAs, visual changes. Hyperlipidemia- chronic problem, on Simvastatin.  Denies abd pain, N/V. Obesity- chronic problem, exercising regularly DM- chronic problem. On Amaryl, Metformin w/ hx of good control.  On ARB for renal protection.  UTD on foot exam, eye exam. Physical Activity: exercising regularly Fall Risk: low Depression: denies current sxs Hearing: normal to conversational tones and whispered voice at 6 ft ADL's: independent Cognitive: normal linear thought process, memory and attention intact Home Safety: safe at home, lives w/ wife Height, Weight, BMI, Visual Acuity: see vitals, vision corrected to 20/20 w/ glasses Counseling: UTD on colonoscopy, urology, foot exam, eye exam, immunizations Labs Ordered: See A&P Care Plan: See A&P    Review of Systems Patient reports no vision/hearing changes, anorexia, fever ,adenopathy, persistant/recurrent hoarseness, swallowing issues, chest pain, palpitations, edema, persistant/recurrent cough, hemoptysis, dyspnea (rest,exertional, paroxysmal nocturnal), gastrointestinal  bleeding (melena, rectal bleeding), abdominal pain, excessive heart burn, GU symptoms (dysuria, hematuria, voiding/incontinence issues) syncope, focal weakness, memory loss, numbness & tingling, skin/hair/nail changes, depression, anxiety, abnormal bruising/bleeding, musculoskeletal symptoms/signs.     Objective:   Physical Exam General Appearance:    Alert, cooperative, no distress, appears stated age  Head:    Normocephalic, without obvious abnormality, atraumatic  Eyes:    PERRL, conjunctiva/corneas clear, EOM's intact, fundi    benign, both eyes       Ears:    Normal TM's and external ear canals, both ears  Nose:   Nares normal, septum midline, mucosa normal, no  drainage   or sinus tenderness  Throat:   Lips, mucosa, and tongue normal; teeth and gums normal  Neck:   Supple, symmetrical, trachea midline, no adenopathy;       thyroid:  No enlargement/tenderness/nodules  Back:     Symmetric, no curvature, ROM normal, no CVA tenderness  Lungs:     Clear to auscultation bilaterally, respirations unlabored  Chest wall:    No tenderness or deformity  Heart:    Regular rate and rhythm, S1 and S2 normal, no murmur, rub   or gallop  Abdomen:     Soft, non-tender, bowel sounds active all four quadrants,    no masses, no organomegaly  Genitalia:    Deferred to urology  Rectal:    Extremities:   Extremities normal, atraumatic, no cyanosis or edema  Pulses:   2+ and symmetric all extremities  Skin:   Skin color, texture, turgor normal, no rashes or lesions  Lymph nodes:   Cervical, supraclavicular, and axillary nodes normal  Neurologic:   CNII-XII intact. Normal strength, sensation and reflexes      throughout          Assessment & Plan:

## 2014-12-25 NOTE — Assessment & Plan Note (Signed)
Chronic problem.  Tolerating metformin w/o difficulty.  On ARB for renal protection.  UTD on eye exam, foot exam.  Check labs.  Adjust meds prn

## 2014-12-25 NOTE — Progress Notes (Signed)
Pre visit review using our clinic review tool, if applicable. No additional management support is needed unless otherwise documented below in the visit note. 

## 2014-12-25 NOTE — Assessment & Plan Note (Signed)
Chronic problem.  Tolerating statin w/o difficulty.  Check labs.  Adjust meds prn  

## 2015-01-05 DIAGNOSIS — N390 Urinary tract infection, site not specified: Secondary | ICD-10-CM

## 2015-01-05 HISTORY — DX: Urinary tract infection, site not specified: N39.0

## 2015-01-14 ENCOUNTER — Telehealth: Payer: Self-pay | Admitting: Family Medicine

## 2015-01-14 NOTE — Telephone Encounter (Signed)
After extensive chart review, the mention of pulmonary nodule dates back to chest CT done 03/13/08.  The official read states  'Tiny right lower lobe nodules are likely subpleural lymph nodes'.  It also says that if pt is low risk for bronchogenic carcinoma, no follow up is required.  Since pt was seeing Dr Lenna Gilford (a pulmonologist), it was likely determined that he was at low or no risk and no f/u was scheduled.  We can certainly repeat the CT scan if desired but this has associated radiation exposure (as do all CT scans)

## 2015-01-14 NOTE — Telephone Encounter (Signed)
Pt and wife called stating that he applied for additional life insurance and was just told today that he would be turned down due to him having a lung nodule with no additional testing to show that it is still present, increased/decreased in size, etc. They are very concerned as they have never been informed that pt had a lung nodule or has testing done that they are aware of. Pt and wife would like a call back urgently regarding this matter so they are able to have additional testing/diagnosis/treatment or to find out if there is some type of error to see if they can have the life insurance approved.

## 2015-01-14 NOTE — Telephone Encounter (Signed)
Called patient and discussed provider's note below.  He stated understanding, but was upset that report sent to insurance company did not state low risk or no follow up required regarding lung nodule.  Pt passed phone to wife to further explain the situation.  Wife was encouraged to call insurance company to see what additional information is needed.    Wife called insurance company and called office back. Insurance company states all they would need is a statement from Dr. Birdie Riddle stating what Dr. Birdie Riddle found in record (i.e. Lobe nodule likely lymph node, Low risk, no follow up required).    Please advise.

## 2015-01-15 NOTE — Telephone Encounter (Signed)
Called patient left message for return call regarding Note for insurance.

## 2015-01-15 NOTE — Telephone Encounter (Signed)
Please explain to pt that I did not complete the form indicating the presence of a nodule (since I was unaware of this).  They submitted a request to medical records asking for the last 5 yrs of his chart to be sent to the insurance company.  Somewhere in all of these records must have been mention of this nodule.  I will be happy to write a letter restating the findings from 7 yrs ago as they are written word for word on the imaging report.

## 2015-01-16 ENCOUNTER — Encounter: Payer: Self-pay | Admitting: Family Medicine

## 2015-01-16 NOTE — Telephone Encounter (Signed)
Patient states that they do want letter written by Dr. Birdie Riddle to be sent to [employer] by them. Dr. Birdie Riddle notified.

## 2015-01-16 NOTE — Telephone Encounter (Signed)
Letter complete.

## 2015-01-16 NOTE — Telephone Encounter (Signed)
-----   Message from Quintin Alto sent at 01/16/2015  4:05 PM EST ----- Regarding: Returned Call Contact: [phone] Wife returned call from you. States they are out of town and she just received your message. Plse call her

## 2015-01-27 ENCOUNTER — Other Ambulatory Visit: Payer: Self-pay | Admitting: Family Medicine

## 2015-01-27 NOTE — Telephone Encounter (Signed)
Medication filled to pharmacy as requested.   

## 2015-01-30 ENCOUNTER — Other Ambulatory Visit: Payer: Self-pay | Admitting: Family Medicine

## 2015-01-30 NOTE — Telephone Encounter (Signed)
Medication filled to pharmacy as requested.   

## 2015-02-03 DIAGNOSIS — M5136 Other intervertebral disc degeneration, lumbar region: Secondary | ICD-10-CM | POA: Diagnosis not present

## 2015-02-03 DIAGNOSIS — M791 Myalgia: Secondary | ICD-10-CM | POA: Diagnosis not present

## 2015-02-05 ENCOUNTER — Telehealth: Payer: Self-pay | Admitting: Family Medicine

## 2015-02-05 NOTE — Telephone Encounter (Signed)
Called patient left message for call back.  

## 2015-02-05 NOTE — Telephone Encounter (Signed)
Pt says that CMA is helping him with something and he need to speak with you.  He is requesting a call back.   [phone]

## 2015-02-07 NOTE — Telephone Encounter (Signed)
Called patient. Informed wife that letter is at front desk for pick up

## 2015-02-11 DIAGNOSIS — M5136 Other intervertebral disc degeneration, lumbar region: Secondary | ICD-10-CM | POA: Diagnosis not present

## 2015-02-14 DIAGNOSIS — L821 Other seborrheic keratosis: Secondary | ICD-10-CM | POA: Diagnosis not present

## 2015-02-14 DIAGNOSIS — D2371 Other benign neoplasm of skin of right lower limb, including hip: Secondary | ICD-10-CM | POA: Diagnosis not present

## 2015-02-14 DIAGNOSIS — D2372 Other benign neoplasm of skin of left lower limb, including hip: Secondary | ICD-10-CM | POA: Diagnosis not present

## 2015-02-14 DIAGNOSIS — D1801 Hemangioma of skin and subcutaneous tissue: Secondary | ICD-10-CM | POA: Diagnosis not present

## 2015-02-14 DIAGNOSIS — L918 Other hypertrophic disorders of the skin: Secondary | ICD-10-CM | POA: Diagnosis not present

## 2015-02-14 DIAGNOSIS — D225 Melanocytic nevi of trunk: Secondary | ICD-10-CM | POA: Diagnosis not present

## 2015-02-19 DIAGNOSIS — M5416 Radiculopathy, lumbar region: Secondary | ICD-10-CM | POA: Diagnosis not present

## 2015-02-19 DIAGNOSIS — M4807 Spinal stenosis, lumbosacral region: Secondary | ICD-10-CM | POA: Diagnosis not present

## 2015-02-19 DIAGNOSIS — M5136 Other intervertebral disc degeneration, lumbar region: Secondary | ICD-10-CM | POA: Diagnosis not present

## 2015-02-26 ENCOUNTER — Ambulatory Visit (INDEPENDENT_AMBULATORY_CARE_PROVIDER_SITE_OTHER): Payer: Medicare Other | Admitting: Physician Assistant

## 2015-02-26 ENCOUNTER — Encounter: Payer: Self-pay | Admitting: Physician Assistant

## 2015-02-26 VITALS — BP 100/50 | HR 59 | Temp 97.7°F | Ht 70.0 in | Wt 243.2 lb

## 2015-02-26 DIAGNOSIS — J101 Influenza due to other identified influenza virus with other respiratory manifestations: Secondary | ICD-10-CM | POA: Diagnosis not present

## 2015-02-26 LAB — POC INFLUENZA A&B (BINAX/QUICKVUE)
Influenza A, POC: POSITIVE — AB
Influenza B, POC: NEGATIVE

## 2015-02-26 MED ORDER — OSELTAMIVIR PHOSPHATE 75 MG PO CAPS: 75.0000 mg | ORAL_CAPSULE | Freq: Two times a day (BID) | ORAL | Status: DC

## 2015-02-26 NOTE — Assessment & Plan Note (Signed)
Flu swab + Flu A. Rx Tamiflu. OTC Delsym recommended. Supportive measures reviewed.

## 2015-02-26 NOTE — Patient Instructions (Signed)
Please take Tamiflu as directed. Increase fluid and rest. Tylenol for fever and aches. Use over-the-counter Delsym for cough.  Influenza, Adult Influenza ("the flu") is a viral infection of the respiratory tract. It occurs more often in winter months because people spend more time in close contact with one another. Influenza can make you feel very sick. Influenza easily spreads from person to person (contagious). CAUSES  Influenza is caused by a virus that infects the respiratory tract. You can catch the virus by breathing in droplets from an infected person's cough or sneeze. You can also catch the virus by touching something that was recently contaminated with the virus and then touching your mouth, nose, or eyes. RISKS AND COMPLICATIONS You may be at risk for a more severe case of influenza if you smoke cigarettes, have diabetes, have chronic heart disease (such as heart failure) or lung disease (such as asthma), or if you have a weakened immune system. Elderly people and pregnant women are also at risk for more serious infections. The most common problem of influenza is a lung infection (pneumonia). Sometimes, this problem can require emergency medical care and may be life threatening. SIGNS AND SYMPTOMS  Symptoms typically last 4 to 10 days and may include:  Fever.  Chills.  Headache, body aches, and muscle aches.  Sore throat.  Chest discomfort and cough.  Poor appetite.  Weakness or feeling tired.  Dizziness.  Nausea or vomiting. DIAGNOSIS  Diagnosis of influenza is often made based on your history and a physical exam. A nose or throat swab test can be done to confirm the diagnosis. TREATMENT  In mild cases, influenza goes away on its own. Treatment is directed at relieving symptoms. For more severe cases, your health care provider may prescribe antiviral medicines to shorten the sickness. Antibiotic medicines are not effective because the infection is caused by a virus, not  by bacteria. HOME CARE INSTRUCTIONS  Take medicines only as directed by your health care provider.  Use a cool mist humidifier to make breathing easier.  Get plenty of rest until your temperature returns to normal. This usually takes 3 to 4 days.  Drink enough fluid to keep your urine clear or pale yellow.  Cover yourmouth and nosewhen coughing or sneezing,and wash your handswellto prevent thevirusfrom spreading.  Stay homefromwork orschool untilthe fever is gonefor at least 40full day. PREVENTION  An annual influenza vaccination (flu shot) is the best way to avoid getting influenza. An annual flu shot is now routinely recommended for all adults in the [place] IF:  You experiencechest pain, yourcough worsens,or you producemore mucus.  Youhave nausea,vomiting, ordiarrhea.  Your fever returns or gets worse. SEEK IMMEDIATE MEDICAL CARE IF:  You havetrouble breathing, you become short of breath,or your skin ornails becomebluish.  You have severe painor stiffnessin the neck.  You develop a sudden headache, or pain in the face or ear.  You have nausea or vomiting that you cannot control. MAKE SURE YOU:   Understand these instructions.  Will watch your condition.  Will get help right away if you are not doing well or get worse.   This information is not intended to replace advice given to you by your health care provider. Make sure you discuss any questions you have with your health care provider.   Document Released: 12/19/1999 Document Revised: 01/11/2014 Document Reviewed: 03/22/2011 Elsevier Interactive Patient Education [employer].

## 2015-02-26 NOTE — Progress Notes (Signed)
Pre visit review using our clinic review tool, if applicable. No additional management support is needed unless otherwise documented below in the visit note. 

## 2015-02-26 NOTE — Progress Notes (Signed)
Patient presents to clinic today c/o 1 day of cough, sore throat and sweats along with mild anorexia. Endorses low-grade fever at home (max 99.9). Has taken tylenol about an hour or so before visit. Endorses wife diagnosed with the flu on Friday and he has been taking care of her.  Past Medical History  Diagnosis Date  . Anxiety   . Low back pain syndrome   . DJD (degenerative joint disease)   . Nephrolithiasis   . BPH (benign prostatic hypertrophy) with urinary obstruction   . Cholelithiasis   . Hemorrhoids   . History of colonic polyps   . Diverticulosis of colon   . GERD (gastroesophageal reflux disease)   . Diabetes mellitus   . Hyperlipidemia   . History of DVT (deep vein thrombosis)   . Venous insufficiency   . Varicose veins of lower extremities   . Hypertension, benign   . pulmonary nodule, left lower lobe   . Clotting disorder ([place])   . History of kidney stones     Current Outpatient Prescriptions on File Prior to Visit  Medication Sig Dispense Refill  . aspirin EC 81 MG tablet Take 81 mg by mouth daily.    . cetirizine (ZYRTEC) 10 MG tablet Take 10 mg by mouth daily.    . diclofenac (VOLTAREN) 75 MG EC tablet TAKE 1 TABLET BY MOUTH TWICE A DAY WITH A MEAL 180 tablet 1  . glimepiride (AMARYL) 2 MG tablet TAKE 1 TABLET (2 MG TOTAL) BY MOUTH DAILY BEFORE BREAKFAST. 90 tablet 1  . losartan (COZAAR) 100 MG tablet TAKE 1 TABLET BY MOUTH EVERY DAY 90 tablet 1  . metFORMIN (GLUCOPHAGE) 500 MG tablet TAKE 1 TABLET BY MOUTH TWICE A DAY WITH A MEAL 180 tablet 1  . multivitamin (THERAGRAN) per tablet Take 1 tablet by mouth daily.     . pantoprazole (PROTONIX) 40 MG tablet TAKE 1 TABLET (40 MG TOTAL) BY MOUTH 2 (TWO) TIMES DAILY. 180 tablet 1  . ranitidine (ZANTAC) 150 MG capsule Take 150 mg by mouth at bedtime.     [name] RAPAFLO 8 MG CAPS capsule Take 8 mg by mouth daily.  5  . simvastatin (ZOCOR) 40 MG tablet TAKE 1 TABLET (40 MG TOTAL) BY MOUTH AT BEDTIME. 90 tablet 1   No  current facility-administered medications on file prior to visit.    Allergies  Allergen Reactions  . Ciprofloxacin     REACTION: rash  . Niacin     REACTION: hypertension, shaky    Family History  Problem Relation Age of Onset  . Gout Father   . Coronary artery disease Father 77    died of massive MI age 40  . Heart attack Father   . CAD Brother 79    Social History   Social History  . Marital Status: Married    Spouse Name: N/A  . Number of Children: 2  . Years of Education: N/A   Social History Main Topics  . Smoking status: Never Smoker   . Smokeless tobacco: Never Used  . Alcohol Use: No     Comment: social  use  . Drug Use: No  . Sexual Activity: Not Asked   Other Topics Concern  . None   Social History Narrative   Retired.   Review of Systems - See HPI.  All other ROS are negative.  BP 100/50 mmHg  Pulse 59  Temp(Src) 97.7 F (36.5 C) (Oral)  Ht 5\' 10"  (1.778 m)  Wt  243 lb 3.2 oz (110.315 kg)  BMI 34.90 kg/m2  SpO2 95%  Physical Exam  Constitutional: He is oriented to person, place, and time and well-developed, well-nourished, and in no distress.  HENT:  Head: Normocephalic and atraumatic.  Right Ear: External ear normal.  Left Ear: External ear normal.  Nose: Nose normal.  Mouth/Throat: Oropharynx is clear and moist. No oropharyngeal exudate.  TM within normal limits bilaterally  Eyes: Conjunctivae are normal.  Neck: Neck supple.  Cardiovascular: Normal rate, regular rhythm, normal heart sounds and intact distal pulses.   Pulmonary/Chest: Effort normal and breath sounds normal. No respiratory distress. He has no wheezes. He has no rales. He exhibits no tenderness.  Neurological: He is alert and oriented to person, place, and time.  Skin: Skin is warm and dry. No rash noted.  Vitals reviewed.   Recent Results (from the past 2160 hour(s))  Hemoglobin A1c     Status: None   Collection Time: 12/25/14  9:34 AM  Result Value Ref Range   Hgb  A1c MFr Bld 6.4 4.6 - 6.5 %    Comment: Glycemic Control Guidelines for People with Diabetes:Non Diabetic:  <6%Goal of Therapy: <7%Additional Action Suggested:  >8%   Lipid panel     Status: Abnormal   Collection Time: 12/25/14  9:34 AM  Result Value Ref Range   Cholesterol 112 0 - 200 mg/dL    Comment: ATP III Classification       Desirable:  < 200 mg/dL               Borderline High:  200 - 239 mg/dL          High:  > = 240 mg/dL   Triglycerides 119.0 0.0 - 149.0 mg/dL    Comment: Normal:  <150 mg/dLBorderline High:  150 - 199 mg/dL   HDL 33.90 (L) >39.00 mg/dL   VLDL 23.8 0.0 - 40.0 mg/dL   LDL Cholesterol 54 0 - 99 mg/dL   Total CHOL/HDL Ratio 3     Comment:                Men          Women1/2 Average Risk     3.4          3.3Average Risk          5.0          4.42X Average Risk          9.6          7.13X Average Risk          15.0          11.0                       NonHDL 78.23     Comment: NOTE:  Non-HDL goal should be 30 mg/dL higher than patient's LDL goal (i.e. LDL goal of < 70 mg/dL, would have non-HDL goal of < 100 mg/dL)  Basic metabolic panel     Status: Abnormal   Collection Time: 12/25/14  9:34 AM  Result Value Ref Range   Sodium 142 135 - 145 mEq/L   Potassium 4.5 3.5 - 5.1 mEq/L   Chloride 107 96 - 112 mEq/L   CO2 27 19 - 32 mEq/L   Glucose, Bld 132 (H) 70 - 99 mg/dL   BUN 23 6 - 23 mg/dL   Creatinine, Ser 1.07 0.40 - 1.50 mg/dL   Calcium 9.2  8.4 - 10.5 mg/dL   GFR 72.62 >60.00 mL/min  TSH     Status: None   Collection Time: 12/25/14  9:34 AM  Result Value Ref Range   TSH 0.94 0.35 - 4.50 uIU/mL  Hepatic function panel     Status: None   Collection Time: 12/25/14  9:34 AM  Result Value Ref Range   Total Bilirubin 0.5 0.2 - 1.2 mg/dL   Bilirubin, Direct 0.1 0.0 - 0.3 mg/dL   Alkaline Phosphatase 43 39 - 117 U/L   AST 24 0 - 37 U/L   ALT 40 0 - 53 U/L   Total Protein 6.9 6.0 - 8.3 g/dL   Albumin 4.0 3.5 - 5.2 g/dL  CBC with Differential/Platelet     Status:  None   Collection Time: 12/25/14  9:34 AM  Result Value Ref Range   WBC 8.6 4.0 - 10.5 K/uL   RBC 5.11 4.22 - 5.81 Mil/uL   Hemoglobin 15.5 13.0 - 17.0 g/dL   HCT 46.5 39.0 - 52.0 %   MCV 90.9 78.0 - 100.0 fl   MCHC 33.4 30.0 - 36.0 g/dL   RDW 13.7 11.5 - 15.5 %   Platelets 295.0 150.0 - 400.0 K/uL   Neutrophils Relative % 63.4 43.0 - 77.0 %   Lymphocytes Relative 22.8 12.0 - 46.0 %   Monocytes Relative 9.9 3.0 - 12.0 %   Eosinophils Relative 3.7 0.0 - 5.0 %   Basophils Relative 0.2 0.0 - 3.0 %   Neutro Abs 5.5 1.4 - 7.7 K/uL   Lymphs Abs 2.0 0.7 - 4.0 K/uL   Monocytes Absolute 0.9 0.1 - 1.0 K/uL   Eosinophils Absolute 0.3 0.0 - 0.7 K/uL   Basophils Absolute 0.0 0.0 - 0.1 K/uL  POC Influenza A&B (Binax test)     Status: Abnormal   Collection Time: 02/26/15  3:36 PM  Result Value Ref Range   Influenza A, POC Positive (A) Negative   Influenza B, POC Negative Negative    Assessment/Plan: Influenza A Flu swab + Flu A. Rx Tamiflu. OTC Delsym recommended. Supportive measures reviewed.

## 2015-03-05 DIAGNOSIS — M5136 Other intervertebral disc degeneration, lumbar region: Secondary | ICD-10-CM | POA: Diagnosis not present

## 2015-03-15 ENCOUNTER — Other Ambulatory Visit: Payer: Self-pay | Admitting: Family Medicine

## 2015-03-17 NOTE — Telephone Encounter (Signed)
Medication filled to pharmacy as requested.   

## 2015-03-18 DIAGNOSIS — M25731 Osteophyte, right wrist: Secondary | ICD-10-CM | POA: Diagnosis not present

## 2015-03-20 DIAGNOSIS — M5136 Other intervertebral disc degeneration, lumbar region: Secondary | ICD-10-CM | POA: Diagnosis not present

## 2015-03-20 DIAGNOSIS — M5416 Radiculopathy, lumbar region: Secondary | ICD-10-CM | POA: Diagnosis not present

## 2015-03-20 DIAGNOSIS — M4807 Spinal stenosis, lumbosacral region: Secondary | ICD-10-CM | POA: Diagnosis not present

## 2015-03-30 ENCOUNTER — Other Ambulatory Visit: Payer: Self-pay | Admitting: Family Medicine

## 2015-03-31 NOTE — Telephone Encounter (Signed)
Medication filled to pharmacy as requested.   

## 2015-04-01 ENCOUNTER — Encounter: Payer: Self-pay | Admitting: Family Medicine

## 2015-04-01 ENCOUNTER — Ambulatory Visit (INDEPENDENT_AMBULATORY_CARE_PROVIDER_SITE_OTHER): Payer: Medicare Other | Admitting: Family Medicine

## 2015-04-01 VITALS — BP 112/68 | HR 76 | Temp 98.0°F | Resp 16 | Ht 70.0 in | Wt 245.4 lb

## 2015-04-01 DIAGNOSIS — E119 Type 2 diabetes mellitus without complications: Secondary | ICD-10-CM

## 2015-04-01 LAB — BASIC METABOLIC PANEL
BUN: 22 mg/dL (ref 6–23)
CHLORIDE: 107 meq/L (ref 96–112)
CO2: 26 mEq/L (ref 19–32)
Calcium: 9.4 mg/dL (ref 8.4–10.5)
Creatinine, Ser: 1.25 mg/dL (ref 0.40–1.50)
GFR: 60.64 mL/min (ref >60.00)
Glucose, Bld: 144 mg/dL — ABNORMAL HIGH (ref 70–99)
POTASSIUM: 4.4 meq/L (ref 3.5–5.1)
SODIUM: 141 meq/L (ref 135–145)

## 2015-04-01 LAB — HEMOGLOBIN A1C: HEMOGLOBIN A1C: 7.1 % — AB (ref 4.6–6.5)

## 2015-04-01 MED ORDER — DICLOFENAC SODIUM 75 MG PO TBEC: DELAYED_RELEASE_TABLET | ORAL | Status: DC

## 2015-04-01 NOTE — Progress Notes (Signed)
Pre visit review using our clinic review tool, if applicable. No additional management support is needed unless otherwise documented below in the visit note. 

## 2015-04-01 NOTE — Assessment & Plan Note (Signed)
Chronic problem.  Tolerating meds w/o difficulty.  UTD on eye exam.  Foot exam done today.  On ARB for renal protection.  Had steroid injxn which may have increased sugars.  Check labs.  Adjust meds prn

## 2015-04-01 NOTE — Patient Instructions (Signed)
Follow up in 3-4 months to recheck diabetes and cholesterol We'll notify you of your lab results and make any changes if needed Keep up the good work!  You look great! Call with any questions or concerns Happy Spring!!! Thanks for sticking with [place]!!!

## 2015-04-01 NOTE — Progress Notes (Signed)
   Subjective:    Patient ID: [name], male    DOB: 06/12/1945, 70 y.o.   MRN: [id number]  HPI DM- chronic problem, on Metformin and Glimepiride.  On ARB for renal protection.  UTD on eye exam, due for foot exam.  Pt had steroid shot in back which could have elevated sugars.  Exercising periodically.  Denies symptomatic lows.  No CP, SOB, HAs, visual changes, edema, abd pain, N/V, numbness/tingling hands/feet.   Review of Systems For ROS see HPI     Objective:   Physical Exam  Constitutional: He is oriented to person, place, and time. He appears well-developed and well-nourished. No distress.  HENT:  Head: Normocephalic and atraumatic.  Eyes: Conjunctivae and EOM are normal. Pupils are equal, round, and reactive to light.  Neck: Normal range of motion. Neck supple. No thyromegaly present.  Cardiovascular: Normal rate, regular rhythm, normal heart sounds and intact distal pulses.   No murmur heard. Pulmonary/Chest: Effort normal and breath sounds normal. No respiratory distress.  Abdominal: Soft. Bowel sounds are normal. He exhibits no distension.  Musculoskeletal: He exhibits no edema.  Lymphadenopathy:    He has no cervical adenopathy.  Neurological: He is alert and oriented to person, place, and time. No cranial nerve deficit.  Skin: Skin is warm and dry.  Psychiatric: He has a normal mood and affect. His behavior is normal.  Vitals reviewed.         Assessment & Plan:

## 2015-04-02 ENCOUNTER — Encounter: Payer: Self-pay | Admitting: General Practice

## 2015-05-07 DIAGNOSIS — N200 Calculus of kidney: Secondary | ICD-10-CM | POA: Diagnosis not present

## 2015-05-07 DIAGNOSIS — Z0000 Encounter for general adult medical examination without abnormal findings: Secondary | ICD-10-CM | POA: Diagnosis not present

## 2015-05-07 DIAGNOSIS — N312 Flaccid neuropathic bladder, not elsewhere classified: Secondary | ICD-10-CM | POA: Diagnosis not present

## 2015-05-07 DIAGNOSIS — N390 Urinary tract infection, site not specified: Secondary | ICD-10-CM | POA: Diagnosis not present

## 2015-05-07 DIAGNOSIS — N3000 Acute cystitis without hematuria: Secondary | ICD-10-CM | POA: Diagnosis not present

## 2015-06-24 DIAGNOSIS — M5136 Other intervertebral disc degeneration, lumbar region: Secondary | ICD-10-CM | POA: Diagnosis not present

## 2015-07-02 ENCOUNTER — Other Ambulatory Visit: Payer: Self-pay | Admitting: General Practice

## 2015-07-02 MED ORDER — METFORMIN HCL 500 MG PO TABS: ORAL_TABLET | ORAL | Status: DC

## 2015-07-29 ENCOUNTER — Ambulatory Visit: Payer: PRIVATE HEALTH INSURANCE | Admitting: Family Medicine

## 2015-07-29 DIAGNOSIS — H2513 Age-related nuclear cataract, bilateral: Secondary | ICD-10-CM | POA: Diagnosis not present

## 2015-07-29 DIAGNOSIS — E119 Type 2 diabetes mellitus without complications: Secondary | ICD-10-CM | POA: Diagnosis not present

## 2015-07-29 LAB — HM DIABETES EYE EXAM

## 2015-07-30 ENCOUNTER — Ambulatory Visit (INDEPENDENT_AMBULATORY_CARE_PROVIDER_SITE_OTHER): Payer: Medicare Other | Admitting: Family Medicine

## 2015-07-30 ENCOUNTER — Encounter: Payer: Self-pay | Admitting: Family Medicine

## 2015-07-30 VITALS — BP 122/82 | HR 83 | Temp 98.0°F | Resp 16 | Ht 70.0 in | Wt 248.4 lb

## 2015-07-30 DIAGNOSIS — I10 Essential (primary) hypertension: Secondary | ICD-10-CM

## 2015-07-30 DIAGNOSIS — E119 Type 2 diabetes mellitus without complications: Secondary | ICD-10-CM | POA: Diagnosis not present

## 2015-07-30 DIAGNOSIS — E785 Hyperlipidemia, unspecified: Secondary | ICD-10-CM | POA: Diagnosis not present

## 2015-07-30 LAB — HEPATIC FUNCTION PANEL
ALBUMIN: 4.2 g/dL (ref 3.5–5.2)
ALT: 41 U/L (ref 0–53)
AST: 23 U/L (ref 0–37)
Alkaline Phosphatase: 42 U/L (ref 39–117)
Bilirubin, Direct: 0.1 mg/dL (ref 0.0–0.3)
Total Bilirubin: 0.6 mg/dL (ref 0.2–1.2)
Total Protein: 6.7 g/dL (ref 6.0–8.3)

## 2015-07-30 LAB — BASIC METABOLIC PANEL
BUN: 19 mg/dL (ref 6–23)
CALCIUM: 9.4 mg/dL (ref 8.4–10.5)
CO2: 27 mEq/L (ref 19–32)
Chloride: 107 mEq/L (ref 96–112)
Creatinine, Ser: 1.14 mg/dL (ref 0.40–1.50)
GFR: 67.38 mL/min (ref >60.00)
Glucose, Bld: 128 mg/dL — ABNORMAL HIGH (ref 70–99)
Potassium: 5 mEq/L (ref 3.5–5.1)
SODIUM: 141 meq/L (ref 135–145)

## 2015-07-30 LAB — CBC WITH DIFFERENTIAL/PLATELET
BASOS ABS: 0 10*3/uL (ref 0.0–0.1)
Basophils Relative: 0.3 % (ref 0.0–3.0)
Eosinophils Absolute: 0.3 10*3/uL (ref 0.0–0.7)
Eosinophils Relative: 3.2 % (ref 0.0–5.0)
HCT: 48.1 % (ref 39.0–52.0)
Hemoglobin: 16 g/dL (ref 13.0–17.0)
LYMPHS ABS: 2.1 10*3/uL (ref 0.7–4.0)
Lymphocytes Relative: 23.8 % (ref 12.0–46.0)
MCHC: 33.2 g/dL (ref 30.0–36.0)
MCV: 91.8 fl (ref 78.0–100.0)
MONO ABS: 0.9 10*3/uL (ref 0.1–1.0)
MONOS PCT: 10 % (ref 3.0–12.0)
NEUTROS ABS: 5.5 10*3/uL (ref 1.4–7.7)
NEUTROS PCT: 62.7 % (ref 43.0–77.0)
PLATELETS: 266 10*3/uL (ref 150.0–400.0)
RBC: 5.24 Mil/uL (ref 4.22–5.81)
RDW: 14.5 % (ref 11.5–15.5)
WBC: 8.8 10*3/uL (ref 4.0–10.5)

## 2015-07-30 LAB — LIPID PANEL
CHOL/HDL RATIO: 3
CHOLESTEROL: 101 mg/dL (ref 0–200)
HDL: 37 mg/dL — ABNORMAL LOW (ref >39.00)
LDL CALC: 43 mg/dL (ref 0–99)
NonHDL: 63.9
TRIGLYCERIDES: 106 mg/dL (ref 0.0–149.0)
VLDL: 21.2 mg/dL (ref 0.0–40.0)

## 2015-07-30 LAB — TSH: TSH: 0.83 u[IU]/mL (ref 0.35–4.50)

## 2015-07-30 LAB — HEMOGLOBIN A1C: Hgb A1c MFr Bld: 6.7 % — ABNORMAL HIGH (ref 4.6–6.5)

## 2015-07-30 MED ORDER — LOSARTAN POTASSIUM 100 MG PO TABS: 100.0000 mg | ORAL_TABLET | Freq: Every day | ORAL | 1 refills | Status: DC

## 2015-07-30 NOTE — Progress Notes (Signed)
   Subjective:    Patient ID: [name], male    DOB: 22-Jul-1945, 70 y.o.   MRN: [id number]  HPI DM- chronic problem, on Amaryl and metformin.  Exercising regularly but struggling w/ back issues.  Eye exam yesterday- no retinopathy.  UTD on foot exam.  On ARB for renal protection.  Denies symptomatic lows.  No numbness/tingling of hands, numbness of L leg due to back issues.    Hyperlipidemia- chronic problem, on Simvastatin.  Denies abd pain, N/V, myalgias  HTN- chronic problem, on Losartan w/ good control.  Denies CP, SOB, HAs, visual changes, edema.   Review of Systems For ROS see HPI     Objective:   Physical Exam  Constitutional: He is oriented to person, place, and time. He appears well-developed and well-nourished. No distress.  HENT:  Head: Normocephalic and atraumatic.  Eyes: Conjunctivae and EOM are normal. Pupils are equal, round, and reactive to light.  Neck: Normal range of motion. Neck supple. No thyromegaly present.  Cardiovascular: Normal rate, regular rhythm, normal heart sounds and intact distal pulses.   No murmur heard. Pulmonary/Chest: Effort normal and breath sounds normal. No respiratory distress.  Abdominal: Soft. Bowel sounds are normal. He exhibits no distension.  Musculoskeletal: He exhibits no edema.  Lymphadenopathy:    He has no cervical adenopathy.  Neurological: He is alert and oriented to person, place, and time. No cranial nerve deficit.  Skin: Skin is warm and dry.  Psychiatric: He has a normal mood and affect. His behavior is normal.  Vitals reviewed.         Assessment & Plan:

## 2015-07-30 NOTE — Patient Instructions (Addendum)
Follow up in 3-4 months to recheck diabetes We'll notify you of your lab results and make any changes if needed Continue to work on healthy diet and regular exercise Call with any questions or concerns Have a great summer!!!

## 2015-07-30 NOTE — Assessment & Plan Note (Signed)
Chronic problem.  Pt had eye exam done yesterday- no retinopathy seen.  UTD on foot exam.  On ARB for renal protection.  Encouraged healthy diet and regular exercise.  Check labs.  Adjust meds prn

## 2015-07-30 NOTE — Progress Notes (Signed)
Pre visit review using our clinic review tool, if applicable. No additional management support is needed unless otherwise documented below in the visit note. 

## 2015-07-30 NOTE — Assessment & Plan Note (Signed)
Chronic problem.  Tolerating statin w/o difficulty.  Check labs.  Adjust meds prn  

## 2015-07-30 NOTE — Assessment & Plan Note (Signed)
Chronic problem.  Currently asymptomatic.  Well controlled today.  Check labs.  No anticipated med changes.

## 2015-08-03 ENCOUNTER — Other Ambulatory Visit: Payer: Self-pay | Admitting: Family Medicine

## 2015-08-11 ENCOUNTER — Encounter: Payer: Self-pay | Admitting: General Practice

## 2015-08-26 DIAGNOSIS — M4807 Spinal stenosis, lumbosacral region: Secondary | ICD-10-CM | POA: Diagnosis not present

## 2015-09-23 ENCOUNTER — Ambulatory Visit (INDEPENDENT_AMBULATORY_CARE_PROVIDER_SITE_OTHER): Payer: Medicare Other

## 2015-09-23 DIAGNOSIS — Z23 Encounter for immunization: Secondary | ICD-10-CM | POA: Diagnosis not present

## 2015-10-01 ENCOUNTER — Other Ambulatory Visit: Payer: Self-pay | Admitting: Family Medicine

## 2015-10-28 ENCOUNTER — Ambulatory Visit (INDEPENDENT_AMBULATORY_CARE_PROVIDER_SITE_OTHER): Payer: Medicare Other | Admitting: Family Medicine

## 2015-10-28 ENCOUNTER — Encounter: Payer: Self-pay | Admitting: Family Medicine

## 2015-10-28 VITALS — BP 118/78 | HR 82 | Temp 97.9°F | Resp 16 | Ht 70.0 in | Wt 251.4 lb

## 2015-10-28 DIAGNOSIS — E119 Type 2 diabetes mellitus without complications: Secondary | ICD-10-CM

## 2015-10-28 LAB — BASIC METABOLIC PANEL
BUN: 21 mg/dL (ref 6–23)
CALCIUM: 9.4 mg/dL (ref 8.4–10.5)
CO2: 27 mEq/L (ref 19–32)
CREATININE: 1.11 mg/dL (ref 0.40–1.50)
Chloride: 108 mEq/L (ref 96–112)
GFR: 69.44 mL/min (ref >60.00)
GLUCOSE: 118 mg/dL — AB (ref 70–99)
Potassium: 4.4 mEq/L (ref 3.5–5.1)
Sodium: 143 mEq/L (ref 135–145)

## 2015-10-28 LAB — HEMOGLOBIN A1C: Hgb A1c MFr Bld: 6.5 % (ref 4.6–6.5)

## 2015-10-28 NOTE — Progress Notes (Signed)
   Subjective:    Patient ID: BASIM STEMM, male    DOB: [birth year], 70 y.o.   MRN: [id number]  HPI DM- chronic problem, on Glimepiride and Metformin.  On ARB for renal protection.  UTD on foot exam, eye exam.  Pt has gained 3-4 lbs since last visit.  Denies symptomatic lows.  No CP, SOB, HAs, visual changes, abd pain, N/V.  R foot numbness due to spinal stenosis.     Review of Systems For ROS see HPI     Objective:   Physical Exam  Constitutional: He is oriented to person, place, and time. He appears well-developed and well-nourished. No distress.  HENT:  Head: Normocephalic and atraumatic.  Eyes: Conjunctivae and EOM are normal. Pupils are equal, round, and reactive to light.  Neck: Normal range of motion. Neck supple. No thyromegaly present.  Cardiovascular: Normal rate, regular rhythm, normal heart sounds and intact distal pulses.   No murmur heard. Pulmonary/Chest: Effort normal and breath sounds normal. No respiratory distress.  Abdominal: Soft. Bowel sounds are normal. He exhibits no distension.  Musculoskeletal: He exhibits no edema.  Lymphadenopathy:    He has no cervical adenopathy.  Neurological: He is alert and oriented to person, place, and time. No cranial nerve deficit.  Skin: Skin is warm and dry.  Psychiatric: He has a normal mood and affect. His behavior is normal.  Vitals reviewed.         Assessment & Plan:

## 2015-10-28 NOTE — Progress Notes (Signed)
Pre visit review using our clinic review tool, if applicable. No additional management support is needed unless otherwise documented below in the visit note. 

## 2015-10-28 NOTE — Patient Instructions (Signed)
Schedule your complete physical in 3-4 months We'll notify you of your lab results and make any changes if needed Continue to work on healthy diet and regular exercise- you can do it! Call with any questions or concerns Happy Fall! Emmit Alexanders with your surgery decision!

## 2015-10-28 NOTE — Assessment & Plan Note (Signed)
Chronic problem.  On Glimepiride and Metformin w/o difficulty.  On ARB for renal protection, UTD on foot and eye exam.  Stressed need for healthy diet and regular exercise.  Check labs.  Adjust meds prn

## 2015-10-29 ENCOUNTER — Encounter: Payer: Self-pay | Admitting: General Practice

## 2015-11-07 ENCOUNTER — Other Ambulatory Visit: Payer: Self-pay | Admitting: Family Medicine

## 2015-11-18 DIAGNOSIS — R351 Nocturia: Secondary | ICD-10-CM | POA: Diagnosis not present

## 2015-11-18 DIAGNOSIS — N401 Enlarged prostate with lower urinary tract symptoms: Secondary | ICD-10-CM | POA: Diagnosis not present

## 2015-11-18 DIAGNOSIS — N200 Calculus of kidney: Secondary | ICD-10-CM | POA: Diagnosis not present

## 2015-11-18 DIAGNOSIS — R972 Elevated prostate specific antigen [PSA]: Secondary | ICD-10-CM | POA: Diagnosis not present

## 2015-11-18 DIAGNOSIS — N3000 Acute cystitis without hematuria: Secondary | ICD-10-CM | POA: Diagnosis not present

## 2016-01-05 DIAGNOSIS — N486 Induration penis plastica: Secondary | ICD-10-CM

## 2016-01-05 HISTORY — DX: Induration penis plastica: N48.6

## 2016-01-07 ENCOUNTER — Encounter: Payer: Self-pay | Admitting: Family Medicine

## 2016-01-07 ENCOUNTER — Ambulatory Visit (INDEPENDENT_AMBULATORY_CARE_PROVIDER_SITE_OTHER): Payer: Medicare Other | Admitting: Family Medicine

## 2016-01-07 VITALS — BP 128/82 | HR 80 | Temp 98.1°F | Resp 17 | Ht 70.0 in | Wt 255.0 lb

## 2016-01-07 DIAGNOSIS — E119 Type 2 diabetes mellitus without complications: Secondary | ICD-10-CM

## 2016-01-07 DIAGNOSIS — I10 Essential (primary) hypertension: Secondary | ICD-10-CM | POA: Diagnosis not present

## 2016-01-07 DIAGNOSIS — E785 Hyperlipidemia, unspecified: Secondary | ICD-10-CM

## 2016-01-07 DIAGNOSIS — Z0000 Encounter for general adult medical examination without abnormal findings: Secondary | ICD-10-CM

## 2016-01-07 LAB — BASIC METABOLIC PANEL
BUN: 18 mg/dL (ref 6–23)
CALCIUM: 9.1 mg/dL (ref 8.4–10.5)
CO2: 27 mEq/L (ref 19–32)
Chloride: 108 mEq/L (ref 96–112)
Creatinine, Ser: 1.02 mg/dL (ref 0.40–1.50)
GFR: 76.51 mL/min (ref >60.00)
Glucose, Bld: 129 mg/dL — ABNORMAL HIGH (ref 70–99)
POTASSIUM: 4.8 meq/L (ref 3.5–5.1)
SODIUM: 143 meq/L (ref 135–145)

## 2016-01-07 LAB — CBC WITH DIFFERENTIAL/PLATELET
BASOS ABS: 0 10*3/uL (ref 0.0–0.1)
BASOS PCT: 0.3 % (ref 0.0–3.0)
Eosinophils Absolute: 0.3 10*3/uL (ref 0.0–0.7)
Eosinophils Relative: 3.5 % (ref 0.0–5.0)
HEMATOCRIT: 45.9 % (ref 39.0–52.0)
Hemoglobin: 15.4 g/dL (ref 13.0–17.0)
LYMPHS PCT: 26.9 % (ref 12.0–46.0)
Lymphs Abs: 2.1 10*3/uL (ref 0.7–4.0)
MCHC: 33.5 g/dL (ref 30.0–36.0)
MCV: 89.7 fl (ref 78.0–100.0)
MONOS PCT: 12 % (ref 3.0–12.0)
Monocytes Absolute: 0.9 10*3/uL (ref 0.1–1.0)
NEUTROS ABS: 4.5 10*3/uL (ref 1.4–7.7)
Neutrophils Relative %: 57.3 % (ref 43.0–77.0)
PLATELETS: 289 10*3/uL (ref 150.0–400.0)
RBC: 5.11 Mil/uL (ref 4.22–5.81)
RDW: 13.6 % (ref 11.5–15.5)
WBC: 7.9 10*3/uL (ref 4.0–10.5)

## 2016-01-07 LAB — LIPID PANEL
Cholesterol: 112 mg/dL (ref 0–200)
HDL: 36.1 mg/dL — ABNORMAL LOW (ref >39.00)
LDL CALC: 52 mg/dL (ref 0–99)
NONHDL: 75.89
Total CHOL/HDL Ratio: 3
Triglycerides: 117 mg/dL (ref 0.0–149.0)
VLDL: 23.4 mg/dL (ref 0.0–40.0)

## 2016-01-07 LAB — HEPATIC FUNCTION PANEL
ALK PHOS: 44 U/L (ref 39–117)
ALT: 43 U/L (ref 0–53)
AST: 24 U/L (ref 0–37)
Albumin: 4.3 g/dL (ref 3.5–5.2)
BILIRUBIN DIRECT: 0.1 mg/dL (ref 0.0–0.3)
BILIRUBIN TOTAL: 0.5 mg/dL (ref 0.2–1.2)
Total Protein: 6.6 g/dL (ref 6.0–8.3)

## 2016-01-07 LAB — TSH: TSH: 0.86 u[IU]/mL (ref 0.35–4.50)

## 2016-01-07 MED ORDER — LOSARTAN POTASSIUM 100 MG PO TABS: 100.0000 mg | ORAL_TABLET | Freq: Every day | ORAL | 1 refills | Status: DC

## 2016-01-07 MED ORDER — METFORMIN HCL 500 MG PO TABS: ORAL_TABLET | ORAL | 1 refills | Status: DC

## 2016-01-07 MED ORDER — PANTOPRAZOLE SODIUM 40 MG PO TBEC: DELAYED_RELEASE_TABLET | ORAL | 1 refills | Status: DC

## 2016-01-07 MED ORDER — GLIMEPIRIDE 2 MG PO TABS: ORAL_TABLET | ORAL | 1 refills | Status: DC

## 2016-01-07 NOTE — Assessment & Plan Note (Signed)
Chronic problem.  Adequate control today.  Asymptomatic.  Stressed need for healthy diet, regular exercise, and weight loss.  Check labs.  No anticipated med changes.

## 2016-01-07 NOTE — Assessment & Plan Note (Signed)
Chronic problem.  Tolerating simvastatin w/o difficulty.  Check labs.  Adjust meds prn  

## 2016-01-07 NOTE — Assessment & Plan Note (Signed)
Pt's PE WNL w/ exception of obesity.  Stressed need for healthy diet and regular exercise.  UTD on urology Gaynelle Arabian).  Due for colonoscopy w/ Dr [name]- pt plans to call and schedule.  UTD on immunizations.  Check labs.  Anticipatory guidance provided.

## 2016-01-07 NOTE — Patient Instructions (Addendum)
Follow up early March to recheck diabetes We'll notify you of your lab results and make any changes if needed Please continue to work on healthy diet and regular exercise- you can do it! Please call Dr [name] and schedule your repeat colonoscopy (according to our records, it's time!) You are up to date on immunizations- yay! Call with any questions or concerns Happy New Year!!

## 2016-01-07 NOTE — Progress Notes (Addendum)
   Subjective:    Patient ID: [name], male    DOB: 08/15/1945, 71 y.o.   MRN: [id number]  HPI Here today for CPE.  Risk Factors: HTN- chronic problem, on Losartan w/ good control.  Denies CP, SOB, HAs, visual changes, edema. Hyperlipidemia- chronic problem, on Simvastatin.  Denies abd pain, N/V, myalgias. DM- chronic problem, on Metformin, Amaryl, on Losartan for renal protection.  UTD on foot exam, eye exam.  Denies symptomatic lows.  No numbness/tingling of hands/feet. Physical Activity: active but no formal exercise Fall Risk: low Depression: denies current sxs Hearing: normal to conversational tones, decreased to whispered voice ADL's: independent Cognitive: normal linear thought process, memory and attention intact Home Safety: safe at home, lives w/ wife Height, Weight, BMI, Visual Acuity: see vitals, vision corrected to 20/20 w/ glasses Counseling: UTD on immunizations, due for colonoscopy.  UTD w/ Gaynelle Arabian on PSA Care team reviewed and updated Labs Ordered: See A&P Care Plan: See A&P    Review of Systems Patient reports no vision/hearing changes, anorexia, fever ,adenopathy, persistant/recurrent hoarseness, swallowing issues, chest pain, palpitations, edema, persistant/recurrent cough, hemoptysis, dyspnea (rest,exertional, paroxysmal nocturnal), gastrointestinal  bleeding (melena, rectal bleeding), abdominal pain, excessive heart burn, GU symptoms (dysuria, hematuria, voiding/incontinence issues) syncope, focal weakness, memory loss, numbness & tingling, skin/hair/nail changes, depression, anxiety, abnormal bruising/bleeding, musculoskeletal symptoms/signs.     Objective:   Physical Exam General Appearance:    Alert, cooperative, no distress, appears stated age, obese  Head:    Normocephalic, without obvious abnormality, atraumatic  Eyes:    PERRL, conjunctiva/corneas clear, EOM's intact, fundi    benign, both eyes       Ears:    Normal TM's and external ear canals,  both ears  Nose:   Nares normal, septum midline, mucosa normal, no drainage   or sinus tenderness  Throat:   Lips, mucosa, and tongue normal; teeth and gums normal  Neck:   Supple, symmetrical, trachea midline, no adenopathy;       thyroid:  No enlargement/tenderness/nodules  Back:     Symmetric, no curvature, ROM normal, no CVA tenderness  Lungs:     Clear to auscultation bilaterally, respirations unlabored  Chest wall:    No tenderness or deformity  Heart:    Regular rate and rhythm, S1 and S2 normal, no murmur, rub   or gallop  Abdomen:     Soft, non-tender, bowel sounds active all four quadrants,    no masses, no organomegaly  Genitalia:    Deferred to urology  Rectal:    Extremities:   Extremities normal, atraumatic, no cyanosis or edema  Pulses:   2+ and symmetric all extremities  Skin:   Skin color, texture, turgor normal, no rashes or lesions  Lymph nodes:   Cervical, supraclavicular, and axillary nodes normal  Neurologic:   CNII-XII intact. Normal strength, sensation and reflexes      throughout         Assessment & Plan:

## 2016-01-07 NOTE — Assessment & Plan Note (Signed)
Chronic problem.  Tolerating Metformin and Amaryl w/o difficulty.  UTD on foot exam, eye exam.  On ARB for renal protection.  Recent A1C WNL- no med changes at this time.  Will follow.

## 2016-01-07 NOTE — Progress Notes (Signed)
Pre visit review using our clinic review tool, if applicable. No additional management support is needed unless otherwise documented below in the visit note. 

## 2016-01-08 ENCOUNTER — Encounter: Payer: Self-pay | Admitting: General Practice

## 2016-01-14 ENCOUNTER — Telehealth: Payer: Self-pay | Admitting: Family Medicine

## 2016-01-14 NOTE — Telephone Encounter (Signed)
Patient's wife calling to report patient is not due for colonoscopy until 2020.  His last one was 2010 and his GI doctor informed him he is due in 10 years.  Please update in health maintenance so they no longer receive reminder calls to schedule.

## 2016-01-14 NOTE — Telephone Encounter (Signed)
Health maintenance already shows this unsure what she is referring to.

## 2016-01-23 ENCOUNTER — Other Ambulatory Visit: Payer: Self-pay | Admitting: Family Medicine

## 2016-03-11 ENCOUNTER — Ambulatory Visit (INDEPENDENT_AMBULATORY_CARE_PROVIDER_SITE_OTHER): Payer: Medicare Other | Admitting: Family Medicine

## 2016-03-11 ENCOUNTER — Encounter: Payer: Self-pay | Admitting: Family Medicine

## 2016-03-11 VITALS — BP 121/78 | HR 103 | Temp 97.9°F | Resp 17 | Ht 70.0 in | Wt 251.4 lb

## 2016-03-11 DIAGNOSIS — E119 Type 2 diabetes mellitus without complications: Secondary | ICD-10-CM | POA: Diagnosis not present

## 2016-03-11 LAB — BASIC METABOLIC PANEL
BUN: 17 mg/dL (ref 6–23)
CALCIUM: 9.7 mg/dL (ref 8.4–10.5)
CHLORIDE: 106 meq/L (ref 96–112)
CO2: 27 mEq/L (ref 19–32)
CREATININE: 1.07 mg/dL (ref 0.40–1.50)
GFR: 72.36 mL/min (ref >60.00)
Glucose, Bld: 147 mg/dL — ABNORMAL HIGH (ref 70–99)
Potassium: 4.6 mEq/L (ref 3.5–5.1)
Sodium: 140 mEq/L (ref 135–145)

## 2016-03-11 LAB — HEMOGLOBIN A1C: HEMOGLOBIN A1C: 6.9 % — AB (ref 4.6–6.5)

## 2016-03-11 NOTE — Patient Instructions (Signed)
Follow up in 3-4 months to recheck diabetes and cholesterol We'll notify you of your lab results and make any changes if needed Continue to work on healthy diet and regular exercise- you can do it!!! Call with any questions or concerns Happy Spring!!!

## 2016-03-11 NOTE — Progress Notes (Signed)
   Subjective:    Patient ID: [name], male    DOB: 1945/03/19, 71 y.o.   MRN: [id number]  HPI DM- chronic problem.  On Glimepiride, Metformin.  On ARB for renal protection.  Pt has lost 4 lbs since last visit.  UTD on eye exam.  Due for foot exam.  No CP, SOB, HAs, visual changes, edema.  + numbness radiating down L leg due to chronic back issues.  Denies symptomatic lows.  Not checking home CBGs.   Review of Systems For ROS see HPI     Objective:   Physical Exam  Constitutional: He is oriented to person, place, and time. He appears well-developed and well-nourished. No distress.  obese  HENT:  Head: Normocephalic and atraumatic.  Eyes: Conjunctivae and EOM are normal. Pupils are equal, round, and reactive to light.  Neck: Normal range of motion. Neck supple. No thyromegaly present.  Cardiovascular: Normal rate, regular rhythm, normal heart sounds and intact distal pulses.   No murmur heard. Pulmonary/Chest: Effort normal and breath sounds normal. No respiratory distress.  Abdominal: Soft. Bowel sounds are normal. He exhibits no distension.  Musculoskeletal: He exhibits no edema.  Lymphadenopathy:    He has no cervical adenopathy.  Neurological: He is alert and oriented to person, place, and time. No cranial nerve deficit.  Skin: Skin is warm and dry.  Psychiatric: He has a normal mood and affect. His behavior is normal.  Vitals reviewed.         Assessment & Plan:

## 2016-03-11 NOTE — Progress Notes (Signed)
Pre visit review using our clinic review tool, if applicable. No additional management support is needed unless otherwise documented below in the visit note. 

## 2016-03-11 NOTE — Assessment & Plan Note (Signed)
Chronic problem.  Tolerating Metformin and Glimepiride w/o difficulty.  UTD on eye exam.  Foot exam done today.  On ARB for renal protection.  Stressed needed for healthy diet and regular exercise.  Check labs.  Adjust meds prn

## 2016-03-12 ENCOUNTER — Encounter: Payer: Self-pay | Admitting: General Practice

## 2016-03-31 ENCOUNTER — Other Ambulatory Visit: Payer: Self-pay | Admitting: Family Medicine

## 2016-04-13 ENCOUNTER — Ambulatory Visit (INDEPENDENT_AMBULATORY_CARE_PROVIDER_SITE_OTHER): Payer: Medicare Other | Admitting: Family Medicine

## 2016-04-13 ENCOUNTER — Encounter: Payer: Self-pay | Admitting: Family Medicine

## 2016-04-13 VITALS — BP 136/86 | HR 79 | Temp 98.1°F | Resp 16 | Ht 70.0 in | Wt 249.1 lb

## 2016-04-13 DIAGNOSIS — K219 Gastro-esophageal reflux disease without esophagitis: Secondary | ICD-10-CM

## 2016-04-13 MED ORDER — RANITIDINE HCL 300 MG PO TABS: 300.0000 mg | ORAL_TABLET | Freq: Every day | ORAL | 3 refills | Status: DC

## 2016-04-13 MED ORDER — GI COCKTAIL ~~LOC~~
30.0000 mL | Freq: Once | ORAL | Status: AC
  Administered 2016-04-13: 30 mL via ORAL

## 2016-04-13 NOTE — Progress Notes (Signed)
Pre visit review using our clinic review tool, if applicable. No additional management support is needed unless otherwise documented below in the visit note. 

## 2016-04-13 NOTE — Assessment & Plan Note (Signed)
Deteriorated.  Pt is having sxs despite Protonix twice daily and Ranitidine 150mg  daily.  Increase ranitidine to 300mg  daily.  Reviewed lifestyle and dietary modifications.  Pt's sxs improved in the office w/ GI cocktail.  Reviewed supportive care and red flags that should prompt return.  Pt expressed understanding and is in agreement w/ plan.

## 2016-04-13 NOTE — Progress Notes (Signed)
   Subjective:    Patient ID: [name], male    DOB: [birth year], 71 y.o.   MRN: [id number]  HPI GERD- chronic problem.  Taking Protonix daily and Ranitidine 150mg  nightly.  sxs started Saturday after eating fried shrimp and spicy peppers.  Has been unable to sleep or lie flat due to sxs.  Has not taken any meds today.  Having partial relief w/ Ranitidine.     Review of Systems For ROS see HPI     Objective:   Physical Exam  Constitutional: He is oriented to person, place, and time. He appears well-developed and well-nourished. No distress.  obese  HENT:  Head: Normocephalic and atraumatic.  Cardiovascular: Normal rate, regular rhythm and normal heart sounds.   Pulmonary/Chest: Effort normal and breath sounds normal. No respiratory distress. He has no wheezes. He has no rales.  Abdominal: Soft. Bowel sounds are normal. He exhibits no distension. There is no tenderness. There is no rebound.  obese  Neurological: He is alert and oriented to person, place, and time.  Skin: Skin is warm and dry.  Psychiatric: He has a normal mood and affect. His behavior is normal. Thought content normal.  Vitals reviewed.         Assessment & Plan:

## 2016-04-13 NOTE — Patient Instructions (Signed)
Follow up as needed/scheduled Continue the Protonix twice daily ADD the Ranitidine 300mg  nightly (this is double your current dose) Try and prop yourself up on pillows at night to avoid lying flat Avoid eating prior to bed Call with any questions or concerns- particularly if symptoms change or worsen Hang in there!!!

## 2016-04-14 DIAGNOSIS — M4807 Spinal stenosis, lumbosacral region: Secondary | ICD-10-CM | POA: Diagnosis not present

## 2016-04-14 DIAGNOSIS — M5416 Radiculopathy, lumbar region: Secondary | ICD-10-CM | POA: Diagnosis not present

## 2016-04-14 DIAGNOSIS — M545 Low back pain: Secondary | ICD-10-CM | POA: Diagnosis not present

## 2016-04-19 ENCOUNTER — Ambulatory Visit (HOSPITAL_COMMUNITY)
Admission: RE | Admit: 2016-04-19 | Discharge: 2016-04-19 | Disposition: A | Discharge status: Home / Self Care | Payer: Medicare Other | Source: Ambulatory Visit | Attending: Cardiology | Admitting: Cardiology

## 2016-04-19 ENCOUNTER — Encounter (HOSPITAL_COMMUNITY): Payer: PRIVATE HEALTH INSURANCE

## 2016-04-19 ENCOUNTER — Other Ambulatory Visit (HOSPITAL_COMMUNITY): Payer: Self-pay | Admitting: Specialist

## 2016-04-19 DIAGNOSIS — I82501 Chronic embolism and thrombosis of unspecified deep veins of right lower extremity: Secondary | ICD-10-CM

## 2016-04-19 DIAGNOSIS — M545 Low back pain: Secondary | ICD-10-CM | POA: Diagnosis not present

## 2016-04-19 DIAGNOSIS — IMO0001 Reserved for inherently not codable concepts without codable children: Secondary | ICD-10-CM

## 2016-04-19 DIAGNOSIS — E669 Obesity, unspecified: Secondary | ICD-10-CM | POA: Insufficient documentation

## 2016-04-19 DIAGNOSIS — Z86718 Personal history of other venous thrombosis and embolism: Secondary | ICD-10-CM | POA: Insufficient documentation

## 2016-04-19 DIAGNOSIS — Z09 Encounter for follow-up examination after completed treatment for conditions other than malignant neoplasm: Secondary | ICD-10-CM | POA: Diagnosis not present

## 2016-04-19 DIAGNOSIS — M455 Ankylosing spondylitis of thoracolumbar region: Secondary | ICD-10-CM | POA: Diagnosis not present

## 2016-04-20 ENCOUNTER — Telehealth: Payer: Self-pay | Admitting: Family Medicine

## 2016-04-20 NOTE — Telephone Encounter (Signed)
Called pt to schedule nurse visit for EKG, pt asked why does this need to be done and asked if this is something that is done at hosp before surgery. If that is the case why is this done twice? Pt would like a call back regarding this before he schedules.

## 2016-04-20 NOTE — Telephone Encounter (Signed)
Pt called back to schedule appt for EKG due to wanting clearance faxed back to [place].

## 2016-04-21 ENCOUNTER — Ambulatory Visit: Payer: Self-pay | Admitting: Orthopedic Surgery

## 2016-04-21 ENCOUNTER — Other Ambulatory Visit (INDEPENDENT_AMBULATORY_CARE_PROVIDER_SITE_OTHER): Payer: Medicare Other | Admitting: Family Medicine

## 2016-04-21 ENCOUNTER — Ambulatory Visit: Payer: Medicare Other

## 2016-04-21 DIAGNOSIS — Z01818 Encounter for other preprocedural examination: Secondary | ICD-10-CM | POA: Diagnosis not present

## 2016-04-21 NOTE — Telephone Encounter (Signed)
Noted  

## 2016-04-22 ENCOUNTER — Ambulatory Visit: Payer: Self-pay | Admitting: Orthopedic Surgery

## 2016-04-22 NOTE — H&P (Signed)
[name] is an 71 y.o. male.   Chief Complaint: back and leg pain HPI: The patient is a 71 year old male who presents today for follow up of their back. The patient is being followed for their low back pain. They are now year(s) out from when symptoms began. Symptoms reported today include: aching, while the patient does not report symptoms of: stiffness, numbness or burning. The patient states that they are doing poorly. Current treatment includes: NSAIDs. The following medication has been used for pain control: Diclofenac. The patient reports their current pain level to be mild to moderate (he has been doing some painting at home and that has flared his back up some). The patient has reported improvement of their symptoms with: Cortisone injections (Esis do not help). The patient indicates that they have questions or concerns today regarding surgery.  [name] is here to discuss surgery with his wife. He has developed more pain and numbness into his left leg since last seen. We had a discussion at that point in time and discussed a lumbar decompression. He is here to discuss surgery as well.  Past Medical History:  Diagnosis Date  . Anxiety   . BPH (benign prostatic hypertrophy) with urinary obstruction   . Cholelithiasis   . Clotting disorder ([place])   . Diabetes mellitus   . Diverticulosis of colon   . DJD (degenerative joint disease)   . GERD (gastroesophageal reflux disease)   . Hemorrhoids   . History of colonic polyps   . History of DVT (deep vein thrombosis)   . History of kidney stones   . Hyperlipidemia   . Hypertension, benign   . Low back pain syndrome   . Nephrolithiasis   . pulmonary nodule, left lower lobe   . Varicose veins of lower extremities   . Venous insufficiency     Past Surgical History:  Procedure Laterality Date  . APPENDECTOMY    . bilateral inguinal hernia repairs    . CERVICAL DISC ARTHROPLASTY    . CHOLECYSTECTOMY  03/07/2012  . CHOLECYSTECTOMY  N/A 03/07/2012   Procedure: LAPAROSCOPIC CHOLECYSTECTOMY;  Surgeon: Rolm Bookbinder, MD;  Location: [facility];  Service: General;  Laterality: N/A;  . HERNIA REPAIR     ventral hernia with mesh 1990s  . JOINT REPLACEMENT    . left ankle surgery with plate and screws  19/1478   by Dr. Sharol Given  . sebaceous cyst surgery    . t1-t2 disc surgery  2007   by Dr. Louanne Skye  . TONSILLECTOMY    . TOTAL KNEE ARTHROPLASTY     right and left    Family History  Problem Relation Age of Onset  . Gout Father   . Coronary artery disease Father 48    died of massive MI age 65  . Heart attack Father   . CAD Brother 14   Social History:  reports that he has never smoked. He has never used smokeless tobacco. He reports that he does not drink alcohol or use drugs.  Allergies:  Allergies  Allergen Reactions  . Ciprofloxacin     REACTION: rash  . Niacin     REACTION: hypertension, shaky     (Not in a hospital admission)  No results found for this or any previous visit (from the past 48 hour(s)). No results found.  Review of Systems  Constitutional: Negative.   HENT: Negative.   Eyes: Negative.   Respiratory: Negative.   Cardiovascular: Negative.   Gastrointestinal:  Negative.   Genitourinary: Negative.   Musculoskeletal: Positive for back pain.  Skin: Negative.   Neurological: Positive for sensory change and focal weakness.  Psychiatric/Behavioral: Negative.     There were no vitals taken for this visit. Physical Exam  Constitutional: He is oriented to person, place, and time. He appears well-developed. He appears distressed.  HENT:  Head: Normocephalic.  Eyes: Pupils are equal, round, and reactive to light.  Neck: Normal range of motion.  Cardiovascular: Normal rate.   Respiratory: Effort normal.  GI: Soft.  Musculoskeletal:  On exam, healthy, in mild distress. Mood and affect is appropriate. Walks with slight antalgic gait. Straight leg raise, left buttock, thigh, and calf pain, mainly  on the right. He has 4/5 EHL and dorsiflexion on the left compared to the right altering sensation in the L5 dermatome. He may have some slight quad weakness on the left as well. Lumbar spine exam reveals no evidence of soft tissue swelling, deformity or skin ecchymosis. On palpation there is no tenderness of the lumbar spine. No flank pain with percussion. The abdomen is soft and nontender. Nontender over the trochanters. No cellulitis or lymphadenopathy.  Good range of motion of the lumbar spine without associated pain. Motor is 5/5 including tibialis anterior, plantar flexion, quadriceps and hamstrings. Patient is normoreflexic. There is no Babinski or clonus. Sensory exam is intact to light touch. Patient has good distal pulses. No DVT. No pain and normal range of motion without instability of the hips, knees and ankles.  Inspection of the cervical spine reveals a normal lordosis without evidence of paraspinous spasms or soft tissue swelling. Nontender to palpation. Full flexion, full extension, full left and right lateral rotation. Extension combined with lateral flexion does not reproduce pain. Negative impingement sign, negative secondary impingement sign of the shoulders. Negative Tinel's median and ulnar nerves at the elbow. Negative carpal compression test at the wrist. Motor of the upper extremities is 5/5 including biceps, triceps, brachioradialis, wrist flexion, wrist extension, finger flexion, finger extension. Reflexes are normoreflexic. Sensory exam is intact to light touch. There is no Hoffmann sign. Nontender over the thoracic spine.  He reports he has significant decrease in his ambulatory capacity, here with his wife.  He still has lumbar radiculopathy, left L4 and L5 nerve root distribution, some into the right.  Neurological: He is alert and oriented to person, place, and time.    Three view radiographs, AP, lateral, flexion and extension demonstrates multilevel disc degeneration at  L4-5 and at L3-4. Mild scoliosis. 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  Review of his MRI demonstrates multifactorial severe stenosis at L4-5 as well as L3-4. At L5-S1 there is a disc protrusion to the right and lateral facet arthrosis bilaterally. There is some facet arthrosis at L2-3.  Assessment/Plan 1. 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 2. Left lower extremity radicular pain secondary to compression at L4-L5 nerve root, progressive and refractory. 3. Non-insulin dependent diabetes. 4. 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 5. History of urinary retention following surgery.  We had extensive discussion concerning current pathology, relevant anatomy and treatment options, total with the discussion and the examination is 40 minutes. We discussed lumbar decompression given he has lower back pain predominantly claudication type symptoms without instability. We discussed decompression at L3-4 and L4-5, he developed symptoms on the right at L5-S1 as well. I had an extensive discussion of the risks and benefits of the lumbar decompression with the patient including bleeding, infection, damage to neurovascular structures, epidural  fibrosis, CSF leak requiring repair. We also discussed increase in pain, adjacent segment disease, recurrent disc herniation, need for future surgery including repeat decompression and/or fusion. We also discussed risks of postoperative hematoma, paralysis, anesthetic complications including DVT, PE, death, cardiopulmonary dysfunction. In addition, the perioperative and postoperative courses were discussed in detail including the rehabilitative time and return to functional activity and work. I provided the patient with an illustrated handout and utilized the appropriate surgical models.  In addition, we specifically talked about strategies to avoid retention. We discussed intraoperative Foley, bladder scanning following that and use of agent that apparently was  not Flomax and that he Dr. Gaynelle Arabian gave him at the hospital, does not have that they will bring with him to the surgical procedure.  In addition, in terms of his DVT typically he is only is only taking aspirin was in his left distal thigh, did not have a pulmonary embolism. We discussed a baby aspirin. However, we cannot utilize Lovenox for two days prior or for five days following. There are literature that will support for utilizing the baby aspirin perioperatively. We discussed preoperative Doppler as well for a baseline and then a consideration a postoperative Doppler and close monitoring of his symptoms, some TED stocking sequential compression early ambulation and hydration, etc. We spent time discussing all of these issues we will obtain preoperative clearance and proceed accordingly. We discussed timing of that I feel that with progressive deficit, I would recommend sooner decompression at this point in time within a month. Any changes in bowel or bladder function or increasing his deficit in the interim, certainly they are to call and discuss that accordingly.  Plan microlumbar decompression L3-4, L4-5  [place] Korver, Conley Rolls., PA-C for Dr. Tonita Cong 04/22/2016, 5:25 PM

## 2016-04-27 ENCOUNTER — Other Ambulatory Visit: Payer: Self-pay | Admitting: Specialist

## 2016-04-30 ENCOUNTER — Other Ambulatory Visit: Payer: Self-pay | Admitting: Specialist

## 2016-05-07 ENCOUNTER — Other Ambulatory Visit: Payer: Self-pay | Admitting: Family Medicine

## 2016-05-11 NOTE — Patient Instructions (Addendum)
[name]  05/11/2016   Your procedure is scheduled on: 05-19-16  Report to [facility] Main  Entrance Take [place]  elevators to 3rd floor to  [place] at 930AM.   Call this number if you have problems the morning of surgery [phone]    Remember: ONLY 1 PERSON MAY GO WITH YOU TO SHORT STAY TO GET  READY MORNING OF YOUR SURGERY.  Do not eat food or drink liquids :After Midnight.     Take these medicines the morning of surgery with A SIP OF WATER: cetirizine(zrytec), pantoprazole(protonix), rapaflo, tylenol as needed  DO NOT TAKE ANY DIABETIC MEDICATIONS DAY OF YOUR SURGERY                               You may not have any metal on your body including hair pins and              piercings  Do not wear jewelry, make-up, lotions, powders or perfumes, deodorant                       Men may shave face and neck.   Do not bring valuables to the hospital. [place].  Contacts, dentures or bridgework may not be worn into surgery.  Leave suitcase in the car. After surgery it may be brought to your room.                Please read over the following fact sheets you were given: _____________________________________________________________________             How to Manage Your Diabetes Before and After Surgery  Why is it important to control my blood sugar before and after surgery? . Improving blood sugar levels before and after surgery helps healing and can limit problems. . A way of improving blood sugar control is eating a healthy diet by: o  Eating less sugar and carbohydrates o  Increasing activity/exercise o  Talking with your doctor about reaching your blood sugar goals . High blood sugars (greater than 180 mg/dL) can raise your risk of infections and slow your recovery, so you will need to focus on controlling your diabetes during the weeks before surgery. . Make sure that the doctor who  takes care of your diabetes knows about your planned surgery including the date and location.    WHAT DO I DO ABOUT MY DIABETES MEDICATION?    . THE DAY BEFORE SURGERY 05-18-16,    take breakfast/morning dose of GLIMIPERIDE as usual     Take METFORMIN as usual     . THE MORNING OF SURGERY 05-19-16, Do not take oral diabetes medicines (pills) !!    Patient Signature:  Date:   Nurse Signature:  Date:   Reviewed and Endorsed by [place] Patient Education Committee, August 2015  [facility] - Preparing for Surgery Before surgery, you can play an important role.  Because skin is not sterile, your skin needs to be as free of germs as possible.  You can reduce the number of germs on your skin by washing with CHG (chlorahexidine gluconate) soap before surgery.  CHG is an antiseptic cleaner which kills germs and bonds with the skin to continue  killing germs even after washing. Please DO NOT use if you have an allergy to CHG or antibacterial soaps.  If your skin becomes reddened/irritated stop using the CHG and inform your nurse when you arrive at Short Stay. Do not shave (including legs and underarms) for at least 48 hours prior to the first CHG shower.  You may shave your face/neck. Please follow these instructions carefully:  1.  Shower with CHG Soap the night before surgery and the  morning of Surgery.  2.  If you choose to wash your hair, wash your hair first as usual with your  normal  shampoo.  3.  After you shampoo, rinse your hair and body thoroughly to remove the  shampoo.                           4.  Use CHG as you would any other liquid soap.  You can apply chg directly  to the skin and wash                       Gently with a scrungie or clean washcloth.  5.  Apply the CHG Soap to your body ONLY FROM THE NECK DOWN.   Do not use on face/ open                           Wound or open sores. Avoid contact with eyes, ears mouth and genitals (private parts).                       Wash  face,  Genitals (private parts) with your normal soap.             6.  Wash thoroughly, paying special attention to the area where your surgery  will be performed.  7.  Thoroughly rinse your body with warm water from the neck down.  8.  DO NOT shower/wash with your normal soap after using and rinsing off  the CHG Soap.                9.  Pat yourself dry with a clean towel.            10.  Wear clean pajamas.            11.  Place clean sheets on your bed the night of your first shower and do not  sleep with pets. Day of Surgery : Do not apply any lotions/deodorants the morning of surgery.  Please wear clean clothes to the hospital/surgery center.  FAILURE TO FOLLOW THESE INSTRUCTIONS MAY RESULT IN THE CANCELLATION OF YOUR SURGERY   ________________________________________________________________________   Incentive Spirometer  An incentive spirometer is a tool that can help keep your lungs clear and active. This tool measures how well you are filling your lungs with each breath. Taking long deep breaths may help reverse or decrease the chance of developing breathing (pulmonary) problems (especially infection) following:  A long period of time when you are unable to move or be active. BEFORE THE PROCEDURE   If the spirometer includes an indicator to show your best effort, your nurse or respiratory therapist will set it to a desired goal.  If possible, sit up straight or lean slightly forward. Try not to slouch.  Hold the incentive spirometer in an upright position. INSTRUCTIONS FOR USE  1. Sit on the edge of your  bed if possible, or sit up as far as you can in bed or on a chair. 2. Hold the incentive spirometer in an upright position. 3. Breathe out normally. 4. Place the mouthpiece in your mouth and seal your lips tightly around it. 5. Breathe in slowly and as deeply as possible, raising the piston or the ball toward the top of the column. 6. Hold your breath for 3-5 seconds or for  as long as possible. Allow the piston or ball to fall to the bottom of the column. 7. Remove the mouthpiece from your mouth and breathe out normally. 8. Rest for a few seconds and repeat Steps 1 through 7 at least 10 times every 1-2 hours when you are awake. Take your time and take a few normal breaths between deep breaths. 9. The spirometer may include an indicator to show your best effort. Use the indicator as a goal to work toward during each repetition. 10. After each set of 10 deep breaths, practice coughing to be sure your lungs are clear. If you have an incision (the cut made at the time of surgery), support your incision when coughing by placing a pillow or rolled up towels firmly against it. Once you are able to get out of bed, walk around indoors and cough well. You may stop using the incentive spirometer when instructed by your caregiver.  RISKS AND COMPLICATIONS  Take your time so you do not get dizzy or light-headed.  If you are in pain, you may need to take or ask for pain medication before doing incentive spirometry. It is harder to take a deep breath if you are having pain. AFTER USE  Rest and breathe slowly and easily.  It can be helpful to keep track of a log of your progress. Your caregiver can provide you with a simple table to help with this. If you are using the spirometer at home, follow these instructions: [place] IF:   You are having difficultly using the spirometer.  You have trouble using the spirometer as often as instructed.  Your pain medication is not giving enough relief while using the spirometer.  You develop fever of 100.5 F (38.1 C) or higher. SEEK IMMEDIATE MEDICAL CARE IF:   You cough up bloody sputum that had not been present before.  You develop fever of 102 F (38.9 C) or greater.  You develop worsening pain at or near the incision site. MAKE SURE YOU:   Understand these instructions.  Will watch your condition.  Will get  help right away if you are not doing well or get worse. Document Released: 05/03/2006 Document Revised: 03/15/2011 Document Reviewed: 07/04/2006 [facility] Patient Information 2014 [place], [place].   ________________________________________________________________________

## 2016-05-11 NOTE — Progress Notes (Signed)
Spoke with Judeen Hammans at [facility] ortho ; she confirmed they do have clearance for this surgery  and that she would ge them faxed over to PST .

## 2016-05-11 NOTE — Progress Notes (Signed)
EKG 04-21-16 epic LOV [place] 04-13-16 epic HgA1C 03-11-16 epic Stress test 02-11-12 epic

## 2016-05-12 ENCOUNTER — Encounter (HOSPITAL_COMMUNITY): Payer: Self-pay

## 2016-05-12 ENCOUNTER — Encounter (HOSPITAL_COMMUNITY)
Admission: RE | Admit: 2016-05-12 | Discharge: 2016-05-12 | Disposition: A | Discharge status: Home / Self Care | Payer: Medicare Other | Source: Ambulatory Visit | Attending: Specialist | Admitting: Specialist

## 2016-05-12 ENCOUNTER — Encounter (INDEPENDENT_AMBULATORY_CARE_PROVIDER_SITE_OTHER): Payer: Self-pay

## 2016-05-12 ENCOUNTER — Ambulatory Visit (HOSPITAL_COMMUNITY)
Admission: RE | Admit: 2016-05-12 | Discharge: 2016-05-12 | Disposition: A | Discharge status: Home / Self Care | Payer: Medicare Other | Source: Ambulatory Visit | Attending: Orthopedic Surgery | Admitting: Orthopedic Surgery

## 2016-05-12 DIAGNOSIS — M4316 Spondylolisthesis, lumbar region: Secondary | ICD-10-CM | POA: Insufficient documentation

## 2016-05-12 DIAGNOSIS — M5136 Other intervertebral disc degeneration, lumbar region: Secondary | ICD-10-CM | POA: Insufficient documentation

## 2016-05-12 DIAGNOSIS — M5126 Other intervertebral disc displacement, lumbar region: Secondary | ICD-10-CM | POA: Insufficient documentation

## 2016-05-12 DIAGNOSIS — M2578 Osteophyte, vertebrae: Secondary | ICD-10-CM | POA: Insufficient documentation

## 2016-05-12 HISTORY — DX: Adverse effect of unspecified anesthetic, initial encounter: T41.45XA

## 2016-05-12 HISTORY — DX: Other complications of anesthesia, initial encounter: T88.59XA

## 2016-05-12 LAB — BASIC METABOLIC PANEL
ANION GAP: 7 (ref 5–15)
BUN: 14 mg/dL (ref 6–20)
CHLORIDE: 106 mmol/L (ref 101–111)
CO2: 26 mmol/L (ref 22–32)
CREATININE: 1.05 mg/dL (ref 0.61–1.24)
Calcium: 9.2 mg/dL (ref 8.9–10.3)
GFR calc non Af Amer: >60 mL/min (ref >60)
Glucose, Bld: 139 mg/dL — ABNORMAL HIGH (ref 65–99)
Potassium: 4.9 mmol/L (ref 3.5–5.1)
SODIUM: 139 mmol/L (ref 135–145)

## 2016-05-12 LAB — CBC
HCT: 46.7 % (ref 39.0–52.0)
HEMOGLOBIN: 15.3 g/dL (ref 13.0–17.0)
MCH: 30.2 pg (ref 26.0–34.0)
MCHC: 32.8 g/dL (ref 30.0–36.0)
MCV: 92.3 fL (ref 78.0–100.0)
Platelets: 292 10*3/uL (ref 150–400)
RBC: 5.06 MIL/uL (ref 4.22–5.81)
RDW: 13.6 % (ref 11.5–15.5)
WBC: 8.1 10*3/uL (ref 4.0–10.5)

## 2016-05-12 LAB — GLUCOSE, CAPILLARY: Glucose-Capillary: 147 mg/dL — ABNORMAL HIGH (ref 65–99)

## 2016-05-12 LAB — SURGICAL PCR SCREEN
MRSA, PCR: NEGATIVE
Staphylococcus aureus: POSITIVE — AB

## 2016-05-12 NOTE — Progress Notes (Signed)
Clearance Dr Birdie Riddle on chart

## 2016-05-19 ENCOUNTER — Ambulatory Visit (HOSPITAL_COMMUNITY): Payer: Medicare Other | Admitting: Registered Nurse

## 2016-05-19 ENCOUNTER — Ambulatory Visit (HOSPITAL_COMMUNITY): Payer: Medicare Other

## 2016-05-19 ENCOUNTER — Encounter (HOSPITAL_COMMUNITY)
Admission: RE | Disposition: A | Discharge status: Home / Self Care | Payer: Self-pay | Source: Ambulatory Visit | Attending: Specialist

## 2016-05-19 ENCOUNTER — Encounter (HOSPITAL_COMMUNITY): Payer: Self-pay | Admitting: *Deleted

## 2016-05-19 ENCOUNTER — Observation Stay (HOSPITAL_COMMUNITY)
Admission: RE | Admit: 2016-05-19 | Discharge: 2016-05-20 | Disposition: A | Discharge status: Home / Self Care | Payer: Medicare Other | Source: Ambulatory Visit | Attending: Specialist | Admitting: Specialist

## 2016-05-19 DIAGNOSIS — I10 Essential (primary) hypertension: Secondary | ICD-10-CM | POA: Insufficient documentation

## 2016-05-19 DIAGNOSIS — Z7984 Long term (current) use of oral hypoglycemic drugs: Secondary | ICD-10-CM | POA: Insufficient documentation

## 2016-05-19 DIAGNOSIS — E785 Hyperlipidemia, unspecified: Secondary | ICD-10-CM | POA: Diagnosis not present

## 2016-05-19 DIAGNOSIS — M5416 Radiculopathy, lumbar region: Secondary | ICD-10-CM | POA: Insufficient documentation

## 2016-05-19 DIAGNOSIS — M48062 Spinal stenosis, lumbar region with neurogenic claudication: Secondary | ICD-10-CM | POA: Diagnosis not present

## 2016-05-19 DIAGNOSIS — K219 Gastro-esophageal reflux disease without esophagitis: Secondary | ICD-10-CM | POA: Insufficient documentation

## 2016-05-19 DIAGNOSIS — Z86718 Personal history of other venous thrombosis and embolism: Secondary | ICD-10-CM | POA: Diagnosis not present

## 2016-05-19 DIAGNOSIS — M1990 Unspecified osteoarthritis, unspecified site: Secondary | ICD-10-CM | POA: Diagnosis not present

## 2016-05-19 DIAGNOSIS — I872 Venous insufficiency (chronic) (peripheral): Secondary | ICD-10-CM | POA: Insufficient documentation

## 2016-05-19 DIAGNOSIS — F419 Anxiety disorder, unspecified: Secondary | ICD-10-CM | POA: Insufficient documentation

## 2016-05-19 DIAGNOSIS — Z8249 Family history of ischemic heart disease and other diseases of the circulatory system: Secondary | ICD-10-CM | POA: Diagnosis not present

## 2016-05-19 DIAGNOSIS — Z79899 Other long term (current) drug therapy: Secondary | ICD-10-CM | POA: Diagnosis not present

## 2016-05-19 DIAGNOSIS — N401 Enlarged prostate with lower urinary tract symptoms: Secondary | ICD-10-CM | POA: Insufficient documentation

## 2016-05-19 DIAGNOSIS — E119 Type 2 diabetes mellitus without complications: Secondary | ICD-10-CM | POA: Diagnosis not present

## 2016-05-19 DIAGNOSIS — M48061 Spinal stenosis, lumbar region without neurogenic claudication: Principal | ICD-10-CM | POA: Diagnosis present

## 2016-05-19 DIAGNOSIS — Z419 Encounter for procedure for purposes other than remedying health state, unspecified: Secondary | ICD-10-CM

## 2016-05-19 HISTORY — PX: LUMBAR LAMINECTOMY/DECOMPRESSION MICRODISCECTOMY: SHX5026

## 2016-05-19 LAB — GLUCOSE, CAPILLARY
GLUCOSE-CAPILLARY: 187 mg/dL — AB (ref 65–99)
GLUCOSE-CAPILLARY: 233 mg/dL — AB (ref 65–99)
Glucose-Capillary: 155 mg/dL — ABNORMAL HIGH (ref 65–99)

## 2016-05-19 SURGERY — LUMBAR LAMINECTOMY/DECOMPRESSION MICRODISCECTOMY 2 LEVELS
Anesthesia: General

## 2016-05-19 MED ORDER — SUGAMMADEX SODIUM 200 MG/2ML IV SOLN
INTRAVENOUS | Status: DC | PRN
  Administered 2016-05-19: 300 mg via INTRAVENOUS

## 2016-05-19 MED ORDER — DOCUSATE SODIUM 100 MG PO CAPS: 100.0000 mg | ORAL_CAPSULE | Freq: Two times a day (BID) | ORAL | 1 refills | Status: DC | PRN

## 2016-05-19 MED ORDER — EPHEDRINE 5 MG/ML INJ
INTRAVENOUS | Status: AC
  Filled 2016-05-19: qty 10

## 2016-05-19 MED ORDER — POLYETHYLENE GLYCOL 3350 17 G PO PACK: 17.0000 g | PACK | Freq: Every day | ORAL | Status: DC | PRN

## 2016-05-19 MED ORDER — ACETAMINOPHEN 650 MG RE SUPP: 650.0000 mg | RECTAL | Status: DC | PRN

## 2016-05-19 MED ORDER — EPHEDRINE SULFATE-NACL 50-0.9 MG/10ML-% IV SOSY
PREFILLED_SYRINGE | INTRAVENOUS | Status: DC | PRN
  Administered 2016-05-19 (×3): 10 mg via INTRAVENOUS

## 2016-05-19 MED ORDER — DEXAMETHASONE SODIUM PHOSPHATE 10 MG/ML IJ SOLN
INTRAMUSCULAR | Status: AC
  Filled 2016-05-19: qty 1

## 2016-05-19 MED ORDER — LIDOCAINE 2% (20 MG/ML) 5 ML SYRINGE
INTRAMUSCULAR | Status: DC | PRN
  Administered 2016-05-19: 100 mg via INTRAVENOUS

## 2016-05-19 MED ORDER — LORATADINE 10 MG PO TABS
10.0000 mg | ORAL_TABLET | Freq: Every day | ORAL | Status: DC
  Administered 2016-05-20: 10 mg via ORAL
  Filled 2016-05-19: qty 1

## 2016-05-19 MED ORDER — INSULIN ASPART 100 UNIT/ML ~~LOC~~ SOLN
0.0000 [IU] | Freq: Three times a day (TID) | SUBCUTANEOUS | Status: DC
  Administered 2016-05-20 (×2): 3 [IU] via SUBCUTANEOUS

## 2016-05-19 MED ORDER — POLYMYXIN B SULFATE 500000 UNITS IJ SOLR
INTRAMUSCULAR | Status: DC | PRN
  Administered 2016-05-19: 500 mL

## 2016-05-19 MED ORDER — ROCURONIUM BROMIDE 50 MG/5ML IV SOSY
PREFILLED_SYRINGE | INTRAVENOUS | Status: AC
  Filled 2016-05-19: qty 5

## 2016-05-19 MED ORDER — METHOCARBAMOL 500 MG PO TABS: 500.0000 mg | ORAL_TABLET | Freq: Four times a day (QID) | ORAL | Status: DC | PRN

## 2016-05-19 MED ORDER — CEFAZOLIN SODIUM-DEXTROSE 2-4 GM/100ML-% IV SOLN
2.0000 g | INTRAVENOUS | Status: AC
  Administered 2016-05-19: 2 g via INTRAVENOUS
  Filled 2016-05-19: qty 100

## 2016-05-19 MED ORDER — LACTATED RINGERS IV SOLN
INTRAVENOUS | Status: DC
  Administered 2016-05-19 (×3): via INTRAVENOUS

## 2016-05-19 MED ORDER — CEFAZOLIN SODIUM-DEXTROSE 2-4 GM/100ML-% IV SOLN
2.0000 g | Freq: Three times a day (TID) | INTRAVENOUS | Status: AC
  Administered 2016-05-19 – 2016-05-20 (×3): 2 g via INTRAVENOUS
  Filled 2016-05-19 (×3): qty 100

## 2016-05-19 MED ORDER — NON FORMULARY: 8.0000 mg | Freq: Every morning | Status: DC

## 2016-05-19 MED ORDER — ROCURONIUM BROMIDE 10 MG/ML (PF) SYRINGE
PREFILLED_SYRINGE | INTRAVENOUS | Status: DC | PRN
  Administered 2016-05-19: 10 mg via INTRAVENOUS
  Administered 2016-05-19: 50 mg via INTRAVENOUS
  Administered 2016-05-19 (×2): 10 mg via INTRAVENOUS

## 2016-05-19 MED ORDER — FENTANYL CITRATE (PF) 100 MCG/2ML IJ SOLN
INTRAMUSCULAR | Status: DC | PRN
  Administered 2016-05-19 (×4): 50 ug via INTRAVENOUS

## 2016-05-19 MED ORDER — LIDOCAINE 2% (20 MG/ML) 5 ML SYRINGE
INTRAMUSCULAR | Status: AC
  Filled 2016-05-19: qty 5

## 2016-05-19 MED ORDER — PANTOPRAZOLE SODIUM 40 MG PO TBEC
40.0000 mg | DELAYED_RELEASE_TABLET | Freq: Two times a day (BID) | ORAL | Status: DC
  Administered 2016-05-19 – 2016-05-20 (×2): 40 mg via ORAL
  Filled 2016-05-19 (×2): qty 1

## 2016-05-19 MED ORDER — DOCUSATE SODIUM 100 MG PO CAPS
100.0000 mg | ORAL_CAPSULE | Freq: Two times a day (BID) | ORAL | Status: DC
Start: 2016-05-19 — End: 2016-05-20
  Administered 2016-05-19 – 2016-05-20 (×2): 100 mg via ORAL
  Filled 2016-05-19: qty 1

## 2016-05-19 MED ORDER — SILODOSIN 4 MG PO CAPS
8.0000 mg | ORAL_CAPSULE | Freq: Every day | ORAL | Status: DC
  Filled 2016-05-19: qty 2

## 2016-05-19 MED ORDER — ACETAMINOPHEN 325 MG PO TABS: 650.0000 mg | ORAL_TABLET | ORAL | Status: DC | PRN

## 2016-05-19 MED ORDER — ACETAMINOPHEN 500 MG PO TABS
1000.0000 mg | ORAL_TABLET | Freq: Four times a day (QID) | ORAL | Status: DC | PRN
  Administered 2016-05-19: 1000 mg via ORAL
  Filled 2016-05-19: qty 2

## 2016-05-19 MED ORDER — POTASSIUM CHLORIDE IN NACL 20-0.45 MEQ/L-% IV SOLN
INTRAVENOUS | Status: DC
  Administered 2016-05-19: 18:00:00 via INTRAVENOUS
  Filled 2016-05-19 (×2): qty 1000

## 2016-05-19 MED ORDER — PROPOFOL 10 MG/ML IV BOLUS
INTRAVENOUS | Status: DC | PRN
Start: 2016-05-19 — End: 2016-05-19
  Administered 2016-05-19: 250 mg via INTRAVENOUS

## 2016-05-19 MED ORDER — FENTANYL CITRATE (PF) 100 MCG/2ML IJ SOLN
INTRAMUSCULAR | Status: AC
  Filled 2016-05-19: qty 2

## 2016-05-19 MED ORDER — MAGNESIUM CITRATE PO SOLN: 1.0000 | Freq: Once | ORAL | Status: DC | PRN

## 2016-05-19 MED ORDER — ACETAMINOPHEN 10 MG/ML IV SOLN
1000.0000 mg | INTRAVENOUS | Status: AC
  Administered 2016-05-19: 1000 mg via INTRAVENOUS
  Filled 2016-05-19: qty 100

## 2016-05-19 MED ORDER — PHENOL 1.4 % MT LIQD: 1.0000 | OROMUCOSAL | Status: DC | PRN

## 2016-05-19 MED ORDER — SODIUM CHLORIDE 0.9 % IR SOLN
Status: AC
  Filled 2016-05-19: qty 500000

## 2016-05-19 MED ORDER — THROMBIN 5000 UNITS EX SOLR
CUTANEOUS | Status: AC
  Filled 2016-05-19: qty 10000

## 2016-05-19 MED ORDER — ONDANSETRON HCL 4 MG PO TABS: 4.0000 mg | ORAL_TABLET | Freq: Four times a day (QID) | ORAL | Status: DC | PRN

## 2016-05-19 MED ORDER — ONDANSETRON HCL 4 MG/2ML IJ SOLN
INTRAMUSCULAR | Status: AC
  Filled 2016-05-19: qty 2

## 2016-05-19 MED ORDER — PROMETHAZINE HCL 25 MG/ML IJ SOLN: 6.2500 mg | INTRAMUSCULAR | Status: DC | PRN

## 2016-05-19 MED ORDER — METHOCARBAMOL 1000 MG/10ML IJ SOLN
500.0000 mg | Freq: Four times a day (QID) | INTRAVENOUS | Status: DC | PRN
  Filled 2016-05-19: qty 5

## 2016-05-19 MED ORDER — THROMBIN 5000 UNITS EX SOLR
CUTANEOUS | Status: DC | PRN
  Administered 2016-05-19: 5000 [IU] via TOPICAL

## 2016-05-19 MED ORDER — MEPERIDINE HCL 50 MG/ML IJ SOLN: 6.2500 mg | INTRAMUSCULAR | Status: DC | PRN

## 2016-05-19 MED ORDER — HYDROMORPHONE HCL 1 MG/ML IJ SOLN
0.2500 mg | INTRAMUSCULAR | Status: DC | PRN
  Administered 2016-05-19: 0.25 mg via INTRAVENOUS

## 2016-05-19 MED ORDER — POLYETHYLENE GLYCOL 3350 17 G PO PACK: 17.0000 g | PACK | Freq: Every day | ORAL | 0 refills | Status: DC

## 2016-05-19 MED ORDER — BISACODYL 5 MG PO TBEC: 5.0000 mg | DELAYED_RELEASE_TABLET | Freq: Every day | ORAL | Status: DC | PRN

## 2016-05-19 MED ORDER — MENTHOL 3 MG MT LOZG: 1.0000 | LOZENGE | OROMUCOSAL | Status: DC | PRN

## 2016-05-19 MED ORDER — LIDOCAINE-EPINEPHRINE 1 %-1:100000 IJ SOLN
INTRAMUSCULAR | Status: DC | PRN
  Administered 2016-05-19: 14 mL

## 2016-05-19 MED ORDER — RISAQUAD PO CAPS
1.0000 | ORAL_CAPSULE | Freq: Every day | ORAL | Status: DC
  Administered 2016-05-19 – 2016-05-20 (×2): 1 via ORAL
  Filled 2016-05-19 (×2): qty 1

## 2016-05-19 MED ORDER — DEXAMETHASONE SODIUM PHOSPHATE 10 MG/ML IJ SOLN
INTRAMUSCULAR | Status: DC | PRN
  Administered 2016-05-19: 10 mg via INTRAVENOUS

## 2016-05-19 MED ORDER — PROPOFOL 10 MG/ML IV BOLUS
INTRAVENOUS | Status: AC
  Filled 2016-05-19: qty 20

## 2016-05-19 MED ORDER — OXYCODONE HCL 5 MG PO TABS
5.0000 mg | ORAL_TABLET | ORAL | Status: DC | PRN
  Administered 2016-05-19 – 2016-05-20 (×3): 5 mg via ORAL
  Filled 2016-05-19: qty 1
  Filled 2016-05-19: qty 2
  Filled 2016-05-19: qty 1

## 2016-05-19 MED ORDER — KETOROLAC TROMETHAMINE 30 MG/ML IJ SOLN: 30.0000 mg | Freq: Once | INTRAMUSCULAR | Status: DC | PRN

## 2016-05-19 MED ORDER — MIDAZOLAM HCL 2 MG/2ML IJ SOLN
INTRAMUSCULAR | Status: AC
  Filled 2016-05-19: qty 2

## 2016-05-19 MED ORDER — LOSARTAN POTASSIUM 50 MG PO TABS
100.0000 mg | ORAL_TABLET | Freq: Every day | ORAL | Status: DC
  Administered 2016-05-19 – 2016-05-20 (×2): 100 mg via ORAL
  Filled 2016-05-19 (×2): qty 2

## 2016-05-19 MED ORDER — LIDOCAINE-EPINEPHRINE 1 %-1:100000 IJ SOLN
INTRAMUSCULAR | Status: AC
  Filled 2016-05-19: qty 1

## 2016-05-19 MED ORDER — MIDAZOLAM HCL 5 MG/5ML IJ SOLN
INTRAMUSCULAR | Status: DC | PRN
  Administered 2016-05-19 (×2): 1 mg via INTRAVENOUS

## 2016-05-19 MED ORDER — OXYCODONE-ACETAMINOPHEN 5-325 MG PO TABS: 1.0000 | ORAL_TABLET | ORAL | 0 refills | Status: DC | PRN

## 2016-05-19 MED ORDER — LACTATED RINGERS IV SOLN: INTRAVENOUS | Status: DC

## 2016-05-19 MED ORDER — ONDANSETRON HCL 4 MG/2ML IJ SOLN
INTRAMUSCULAR | Status: DC | PRN
  Administered 2016-05-19: 4 mg via INTRAVENOUS

## 2016-05-19 MED ORDER — SUCCINYLCHOLINE CHLORIDE 200 MG/10ML IV SOSY
PREFILLED_SYRINGE | INTRAVENOUS | Status: DC | PRN
  Administered 2016-05-19: 120 mg via INTRAVENOUS

## 2016-05-19 MED ORDER — SUGAMMADEX SODIUM 500 MG/5ML IV SOLN
INTRAVENOUS | Status: AC
  Filled 2016-05-19: qty 5

## 2016-05-19 MED ORDER — SUCCINYLCHOLINE CHLORIDE 200 MG/10ML IV SOSY
PREFILLED_SYRINGE | INTRAVENOUS | Status: AC
  Filled 2016-05-19: qty 10

## 2016-05-19 MED ORDER — ACETAMINOPHEN 10 MG/ML IV SOLN
1000.0000 mg | Freq: Four times a day (QID) | INTRAVENOUS | Status: AC
  Administered 2016-05-20: 01:00:00 1000 mg via INTRAVENOUS
  Filled 2016-05-19 (×3): qty 100

## 2016-05-19 MED ORDER — ONDANSETRON HCL 4 MG/2ML IJ SOLN: 4.0000 mg | Freq: Four times a day (QID) | INTRAMUSCULAR | Status: DC | PRN

## 2016-05-19 MED ORDER — HYDROMORPHONE HCL 1 MG/ML IJ SOLN
INTRAMUSCULAR | Status: AC
  Administered 2016-05-19: 0.25 mg via INTRAVENOUS
  Filled 2016-05-19: qty 1

## 2016-05-19 MED ORDER — ALUM & MAG HYDROXIDE-SIMETH 200-200-20 MG/5ML PO SUSP: 30.0000 mL | Freq: Four times a day (QID) | ORAL | Status: DC | PRN

## 2016-05-19 MED ORDER — FAMOTIDINE 20 MG PO TABS
20.0000 mg | ORAL_TABLET | Freq: Every day | ORAL | Status: DC
  Administered 2016-05-20: 10:00:00 20 mg via ORAL
  Filled 2016-05-19: qty 1

## 2016-05-19 MED ORDER — HYDROMORPHONE HCL 1 MG/ML IJ SOLN: 1.0000 mg | INTRAMUSCULAR | Status: DC | PRN

## 2016-05-19 SURGICAL SUPPLY — 53 items
AGENT HMST KT MTR STRL THRMB (HEMOSTASIS) ×1
AGENT HMST SPONGE THK3/8 (HEMOSTASIS)
BAG SPEC THK2 15X12 ZIP CLS (MISCELLANEOUS)
BAG ZIPLOCK 12X15 (MISCELLANEOUS) IMPLANT
CLEANER TIP ELECTROSURG 2X2 (MISCELLANEOUS) ×3 IMPLANT
CLOSURE WOUND 1/2 X4 (GAUZE/BANDAGES/DRESSINGS)
CLOTH 2% CHLOROHEXIDINE 3PK (PERSONAL CARE ITEMS) ×3 IMPLANT
COVER SURGICAL LIGHT HANDLE (MISCELLANEOUS) ×3 IMPLANT
DRAPE MICROSCOPE LEICA (MISCELLANEOUS) ×3 IMPLANT
DRAPE SHEET LG 3/4 BI-LAMINATE (DRAPES) IMPLANT
DRAPE SURG 17X11 SM STRL (DRAPES) ×3 IMPLANT
DRAPE UTILITY XL STRL (DRAPES) ×3 IMPLANT
DRSG AQUACEL AG ADV 3.5X 4 (GAUZE/BANDAGES/DRESSINGS) IMPLANT
DRSG AQUACEL AG ADV 3.5X 6 (GAUZE/BANDAGES/DRESSINGS) ×2 IMPLANT
DURAPREP 26ML APPLICATOR (WOUND CARE) ×3 IMPLANT
DURASEAL SPINE SEALANT 3ML (MISCELLANEOUS) IMPLANT
ELECT BLADE TIP CTD 4 INCH (ELECTRODE) IMPLANT
ELECT REM PT RETURN 15FT ADLT (MISCELLANEOUS) ×3 IMPLANT
GLOVE BIOGEL PI IND STRL 7.0 (GLOVE) ×1 IMPLANT
GLOVE BIOGEL PI INDICATOR 7.0 (GLOVE) ×2
GLOVE SURG SS PI 7.0 STRL IVOR (GLOVE) ×3 IMPLANT
GLOVE SURG SS PI 8.0 STRL IVOR (GLOVE) ×6 IMPLANT
GOWN STRL REUS W/TWL XL LVL3 (GOWN DISPOSABLE) ×6 IMPLANT
HEMOSTAT SPONGE AVITENE ULTRA (HEMOSTASIS) IMPLANT
IV CATH 14GX2 1/4 (CATHETERS) IMPLANT
KIT BASIN OR (CUSTOM PROCEDURE TRAY) ×3 IMPLANT
KIT POSITIONING SURG ANDREWS (MISCELLANEOUS) ×3 IMPLANT
MANIFOLD NEPTUNE II (INSTRUMENTS) ×3 IMPLANT
MARKER SKIN DUAL TIP RULER LAB (MISCELLANEOUS) ×3 IMPLANT
NDL SPNL 18GX3.5 QUINCKE PK (NEEDLE) ×2 IMPLANT
NEEDLE SPNL 18GX3.5 QUINCKE PK (NEEDLE) ×6 IMPLANT
PACK LAMINECTOMY ORTHO (CUSTOM PROCEDURE TRAY) ×3 IMPLANT
PATTIES SURGICAL .5 X.5 (GAUZE/BANDAGES/DRESSINGS) ×3 IMPLANT
PATTIES SURGICAL .75X.75 (GAUZE/BANDAGES/DRESSINGS) ×3 IMPLANT
PATTIES SURGICAL 1X1 (DISPOSABLE) IMPLANT
RUBBERBAND STERILE (MISCELLANEOUS) ×3 IMPLANT
SPONGE LAP 4X18 X RAY DECT (DISPOSABLE) ×2 IMPLANT
SPONGE SURGIFOAM ABS GEL 100 (HEMOSTASIS) ×3 IMPLANT
STAPLER VISISTAT (STAPLE) IMPLANT
STRIP CLOSURE SKIN 1/2X4 (GAUZE/BANDAGES/DRESSINGS) IMPLANT
SURGIFLO W/THROMBIN 8M KIT (HEMOSTASIS) ×2 IMPLANT
SUT NURALON 4 0 TR CR/8 (SUTURE) IMPLANT
SUT PROLENE 3 0 PS 2 (SUTURE) IMPLANT
SUT VIC AB 1 CT1 27 (SUTURE)
SUT VIC AB 1 CT1 27XBRD ANTBC (SUTURE) IMPLANT
SUT VIC AB 1-0 CT2 27 (SUTURE) IMPLANT
SUT VIC AB 2-0 CT1 27 (SUTURE)
SUT VIC AB 2-0 CT1 TAPERPNT 27 (SUTURE) IMPLANT
SUT VIC AB 2-0 CT2 27 (SUTURE) IMPLANT
SYR 3ML LL SCALE MARK (SYRINGE) IMPLANT
TOWEL OR 17X26 10 PK STRL BLUE (TOWEL DISPOSABLE) ×3 IMPLANT
TOWEL OR NON WOVEN STRL DISP B (DISPOSABLE) ×3 IMPLANT
YANKAUER SUCT BULB TIP NO VENT (SUCTIONS) ×3 IMPLANT

## 2016-05-19 NOTE — Progress Notes (Signed)
[name] 04/14/2016 11:10 AM Location: [facility] [address] Patient #: [id number] DOB: December 25, 1945 Married / Language: Cleophus Molt / Race: White Male   History of Present Illness [name]; 04/14/2016 2:00 PM) The patient is a 71 year old male who presents today for follow up of their back. The patient is being followed for their low back pain. They are now year(s) out from when symptoms began. Symptoms reported today include: aching, while the patient does not report symptoms of: stiffness, numbness or burning. The patient states that they are doing poorly. Current treatment includes: NSAIDs. The following medication has been used for pain control: Diclofenac. The patient reports their current pain level to be mild to moderate (he has been doing some painting at home and that has flared his back up some). The patient has reported improvement of their symptoms with: Cortisone injections (Esis do not help). The patient indicates that they have questions or concerns today regarding surgery.    Subjective Transcription(Jeffrey Windy Kalata, MD; 04/16/2016 9:57 AM) [name] is here to discuss surgery with his wife. He has developed more pain and numbness into his left leg since last seen. We had a discussion at that point in time and discussed a lumbar decompression. He is here to discuss surgery as well.     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)  Lumbar spinal stenosis (M48.07)  Lumbar radiculopathy (M54.16)  Sciatica of right side (M54.31) [11/24/1995]: Pain in joint, shoulder region (M25.519) [12/09/2004]: Cervicalgia (M54.2) [12/18/2004]: Problems Reconciled   Allergies ([name]; 04/14/2016 1:56  PM) Niaspan *ANTIHYPERLIPIDEMICS*  Cipro *FLUOROQUINOLONES*  Allergies Reconciled   Family History ([name]; 04/14/2016 1:56 PM) Congestive Heart Failure  father Diabetes Mellitus  mother Heart Disease  father, brother and grandfather fathers side Heart disease in male family member before age 68  Hypertension  mother and father Osteoarthritis  mother First Degree Relatives   Social History [name]; 04/14/2016 1:56 PM) Tobacco use  never smoker Children  2 Tobacco/Smoke Exposure  None. Current work status  retired Engineer, agricultural (Currently)  no Drug/Alcohol Rehab (Previously)  no Exercise  Exercises weekly; does running / walking and gym / weights Illicit drug use  no Living situation  live with spouse Marital status  married Number of flights of stairs before winded  2-3 Pain Contract  no Tobacco / smoke exposure  no  Medication History ([name]; 04/14/2016 2:00 PM) Aspirin 81mg  Active. Diclofenac Active. Losartan Active. Diovan (160MG  Tablet, Oral) Active. Glimepiride (1MG  Tablet, Oral) Active. MetFORMIN HCl (500MG  Tablet, Oral) Active. Ranitidine HCl (300MG  Tablet, Oral) Active. Rapaflo (8MG  Capsule, Oral) Active. Simvastatin (40MG  Tablet, Oral) Active. ZyrTEC Allergy (10MG  Tablet Disint, Oral) Active. Medications Reconciled  Past Surgical History ([name]; 04/14/2016 1:56 PM) Ankle Surgery  left Appendectomy  Arthroscopy of Knee  left Colon Polyp Removal - Colonoscopy  Gallbladder Surgery  laporoscopic Inguinal Hernia Repair  laparoscopic: bilateral Neck Disc Surgery  Tonsillectomy  Total Knee Replacement  bilateral Vasectomy   Other Problems ([name]; 04/14/2016 1:56 PM) Blood Clot  Chronic Renal Failure Syndrome  Diabetes Mellitus, Type II  Gastroesophageal Reflux Disease  Gout  High blood pressure  Hypercholesterolemia  Kidney Stone   Osteoarthritis  Bilateral low back pain without sciatica, unspecified  chronicity (M54.5)  Unspecified Diagnosis   Vitals ([name]; 04/14/2016 2:00 PM) 04/14/2016 1:56 PM Weight: 249 lb Height: 70in Body Surface Area: 2.29 m Body Mass Index: 35.73 kg/m  Pulse: 105 (Regular)  BP: 142/91 (Sitting, Left Arm, Standard)      Objective Transcription(Jeffrey Windy Kalata, MD; 04/16/2016 9:57 AM) On exam, healthy, in mild distress. Mood and affect is appropriate. Walks with slight antalgic gait. Straight leg raise, left buttock, thigh, and calf pain, mainly on the right. He has 4/5 EHL and dorsiflexion on the left compared to the right altering sensation in the L5 dermatome. He may have some slight quad weakness on the left as well. Lumbar spine exam reveals no evidence of soft tissue swelling, deformity or skin ecchymosis. On palpation there is no tenderness of the lumbar spine. No flank pain with percussion. The abdomen is soft and nontender. Nontender over the trochanters. No cellulitis or lymphadenopathy.  Good range of motion of the lumbar spine without associated pain. Motor is 5/5 including tibialis anterior, plantar flexion, quadriceps and hamstrings. Patient is normoreflexic. There is no Babinski or clonus. Sensory exam is intact to light touch. Patient has good distal pulses. No DVT. No pain and normal range of motion without instability of the hips, knees and ankles.  Inspection of the cervical spine reveals a normal lordosis without evidence of paraspinous spasms or soft tissue swelling. Nontender to palpation. Full flexion, full extension, full left and right lateral rotation. Extension combined with lateral flexion does not reproduce pain. Negative impingement sign, negative secondary impingement sign of the shoulders. Negative Tinel's median and ulnar nerves at the elbow. Negative carpal compression test at the wrist. Motor of the upper extremities is 5/5 including  biceps, triceps, brachioradialis, wrist flexion, wrist extension, finger flexion, finger extension. Reflexes are normoreflexic. Sensory exam is intact to light touch. There is no Hoffmann sign. Nontender over the thoracic spine.  He reports he has significant decrease in his ambulatory capacity, here with his wife.  He still has lumbar radiculopathy, left L4 and L5 nerve root distribution, some into the right.  IMAGING Three view radiographs, AP, lateral, flexion and extension demonstrates multilevel disc degeneration at L4-5 and at L3-4. Mild scoliosis. 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  Review of his MRI demonstrates multifactorial severe stenosis at L4-5 as well as L3-4. At L5-S1 there is a disc protrusion to the right and lateral facet arthrosis bilaterally. There is some facet arthrosis at L2-3.     Assessment & Plan Johnn Hai MD; 04/14/2016 2:40 PM) Lumbar spinal stenosis (M48.07) Lumbar radiculopathy (M54.16) Bilateral low back pain without sciatica, unspecified chronicity (M54.5) Current Plans DOPPLER ULTRASOUND OF VEIN OF LOWER EXTREMITY [phone]) (Left lower extremity doppler, hx of DVT, r/o DVT pre op)  Follow up in 4 weeks or as needed  Assessments Transcription(Jeffrey Windy Kalata, MD; 04/16/2016 9:57 AM) 1. 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 2. Left lower extremity radicular pain secondary to compression at L4-L5 nerve root, progressive and refractory. 3. Non-insulin dependent diabetes. 4. 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 5. History of urinary retention following surgery.     Plans Transcription(Jeffrey Windy Kalata, MD; 04/16/2016 9:57 AM) We had extensive discussion concerning current pathology, relevant anatomy and treatment options, total with the discussion and the examination is 40 minutes. We discussed lumbar decompression given he has lower back pain predominantly claudication type symptoms without instability. We discussed  decompression at L3-4 and L4-5, he developed symptoms on the right at L5-S1  as well. I had an extensive discussion of the risks and benefits of the lumbar decompression with the patient including bleeding, infection, damage to neurovascular structures, epidural fibrosis, CSF leak requiring repair. We also discussed increase in pain, adjacent segment disease, recurrent disc herniation, need for future surgery including repeat decompression and/or fusion. We also discussed risks of postoperative hematoma, paralysis, anesthetic complications including DVT, PE, death, cardiopulmonary dysfunction. In addition, the perioperative and postoperative courses were discussed in detail including the rehabilitative time and return to functional activity and work. I provided the patient with an illustrated handout and utilized the appropriate surgical models.  In addition, we specifically talked about strategies to avoid retention. We discussed intraoperative Foley, bladder scanning following that and use of agent that apparently was not Flomax and that he Dr. Gaynelle Arabian gave him at the hospital, does not have that they will bring with him to the surgical procedure.  In addition, in terms of his DVT typically he is only is only taking aspirin was in his left distal thigh, did not have a pulmonary embolism. We discussed a baby aspirin. However, we cannot utilize Lovenox for two days prior or for five days following. There are literature that will support for utilizing the baby aspirin perioperatively. We discussed preoperative Doppler as well for a baseline and then a consideration a postoperative Doppler and close monitoring of his symptoms, some TED stocking sequential compression early ambulation and hydration, etc. We spent time discussing all of these issues we will obtain preoperative clearance and proceed accordingly. We discussed timing of that I feel that with progressive deficit, I would recommend sooner decompression  at this point in time within a month. Any changes in bowel or bladder function or increasing his deficit in the interim, certainly they are to call and discuss that accordingly.     Signed electronically by Johnn Hai, MD (04/15/2016 7:30 AM)

## 2016-05-19 NOTE — Interval H&P Note (Signed)
History and Physical Interval Note:  05/19/2016 12:17 PM  [name]  has presented today for surgery, with the diagnosis of Stenosis L3-4, L4-5  The various methods of treatment have been discussed with the patient and family. After consideration of risks, benefits and other options for treatment, the patient has consented to  Procedure(s): Microlumbar decompression L3-4, L4-5 (N/A) as a surgical intervention .  The patient's history has been reviewed, patient examined, no change in status, stable for surgery.  I have reviewed the patient's chart and labs.  Questions were answered to the patient's satisfaction.     , C

## 2016-05-19 NOTE — Discharge Instructions (Signed)
Walk As Tolerated utilizing back precautions.  No bending, twisting, or lifting.  No driving for 2 weeks.   °Aquacel dressing may remain in place until follow up. May shower with aquacel dressing in place. If the dressing peels off or becomes saturated, you may remove aquacel dressing and place gauze and tape dressing which should be kept clean and dry and changed daily. Do not remove steri-strips if they are present. °See Dr.  in office in 10 to 14 days. Begin taking aspirin 81mg per day starting 4 days after your surgery if not allergic to aspirin or on another blood thinner. °Walk daily even outside. Use a cane or walker only if necessary. °Avoid sitting on soft sofas. ° °

## 2016-05-19 NOTE — Anesthesia Preprocedure Evaluation (Addendum)
Anesthesia Evaluation  Patient identified by MRN, date of birth, ID band Patient awake    Reviewed: Allergy & Precautions, NPO status , Patient's Chart, lab work & pertinent test results  Airway Mallampati: II       Dental no notable dental hx.    Pulmonary asthma ,  Pt will get an Albuterol inhaler prior to OR.   Pulmonary exam normal breath sounds clear to auscultation       Cardiovascular hypertension, Pt. on medications Normal cardiovascular exam Rhythm:Regular Rate:Normal     Neuro/Psych PSYCHIATRIC DISORDERS Anxiety    GI/Hepatic GERD  Medicated and Controlled,  Endo/Other  diabetes, Type 2, Oral Hypoglycemic Agents  Renal/GU      Musculoskeletal  (+) Arthritis , Osteoarthritis,    Abdominal (+) + obese,   Peds  Hematology negative hematology ROS (+)   Anesthesia Other Findings   Reproductive/Obstetrics                             Anesthesia Physical Anesthesia Plan  ASA: II  Anesthesia Plan: General   Post-op Pain Management:    Induction: Intravenous  Airway Management Planned: Oral ETT  Additional Equipment:   Intra-op Plan:   Post-operative Plan: Extubation in OR  Informed Consent: I have reviewed the patients History and Physical, chart, labs and discussed the procedure including the risks, benefits and alternatives for the proposed anesthesia with the patient or authorized representative who has indicated his/her understanding and acceptance.   Dental advisory given  Plan Discussed with: CRNA and Surgeon  Anesthesia Plan Comments:         Anesthesia Quick Evaluation

## 2016-05-19 NOTE — Transfer of Care (Signed)
Immediate Anesthesia Transfer of Care Note  Patient: [name]  Procedure(s) Performed: Procedure(s): Microlumbar decompression L3-4, L4-5 (N/A)  Patient Location: PACU  Anesthesia Type:General  Level of Consciousness:  sedated, patient cooperative and responds to stimulation  Airway & Oxygen Therapy:Patient Spontanous Breathing and Patient connected to face mask oxgen  Post-op Assessment:  Report given to PACU RN and Post -op Vital signs reviewed and stable  Post vital signs:  Reviewed and stable  Last Vitals:  Vitals:   05/19/16 0941  BP: 135/88  Pulse: (!) 18  Resp: 18  Temp: 35.5 C    Complications: No apparent anesthesia complications

## 2016-05-19 NOTE — Anesthesia Postprocedure Evaluation (Addendum)
Anesthesia Post Note  Patient: [name]  Procedure(s) Performed: Procedure(s) (LRB): Microlumbar decompression L3-4, L4-5 (N/A)  Patient location during evaluation: PACU Anesthesia Type: General Level of consciousness: awake Pain management: pain level controlled Vital Signs Assessment: post-procedure vital signs reviewed and stable Respiratory status: spontaneous breathing Cardiovascular status: stable Postop Assessment: no signs of nausea or vomiting Anesthetic complications: no        Last Vitals:  Vitals:   05/19/16 1645 05/19/16 1705  BP: 127/83 (!) 160/76  Pulse: (!) 102 100  Resp: 14 15  Temp:  36.4 C    Last Pain:  Vitals:   05/19/16 1645  TempSrc:   PainSc: 1    Pain Goal: Patients Stated Pain Goal: 6 (05/19/16 0949)                JR,JOHN Mateo Flow

## 2016-05-19 NOTE — Anesthesia Procedure Notes (Signed)
Procedure Name: Intubation Date/Time: 05/19/2016 1:15 PM Performed by: Carleene Cooper A Pre-anesthesia Checklist: Patient identified, Emergency Drugs available, Suction available, Patient being monitored and Timeout performed Patient Re-evaluated:Patient Re-evaluated prior to inductionOxygen Delivery Method: Circle system utilized Preoxygenation: Pre-oxygenation with 100% oxygen Intubation Type: IV induction Ventilation: Mask ventilation without difficulty Laryngoscope Size: Mac and 4 Grade View: Grade I Tube type: Oral Tube size: 7.5 mm Airway Equipment and Method: Stylet Placement Confirmation: ETT inserted through vocal cords under direct vision,  breath sounds checked- equal and bilateral and positive ETCO2 Secured at: 22 cm Tube secured with: Tape Dental Injury: Teeth and Oropharynx as per pre-operative assessment

## 2016-05-19 NOTE — H&P (View-Only) (Signed)
[name] is an 71 y.o. male.   Chief Complaint: back and leg pain HPI: The patient is a 71 year old male who presents today for follow up of their back. The patient is being followed for their low back pain. They are now year(s) out from when symptoms began. Symptoms reported today include: aching, while the patient does not report symptoms of: stiffness, numbness or burning. The patient states that they are doing poorly. Current treatment includes: NSAIDs. The following medication has been used for pain control: Diclofenac. The patient reports their current pain level to be mild to moderate (he has been doing some painting at home and that has flared his back up some). The patient has reported improvement of their symptoms with: Cortisone injections (Esis do not help). The patient indicates that they have questions or concerns today regarding surgery.  [name] is here to discuss surgery with his wife. He has developed more pain and numbness into his left leg since last seen. We had a discussion at that point in time and discussed a lumbar decompression. He is here to discuss surgery as well.  Past Medical History:  Diagnosis Date  . Anxiety   . BPH (benign prostatic hypertrophy) with urinary obstruction   . Cholelithiasis   . Clotting disorder ([place])   . Diabetes mellitus   . Diverticulosis of colon   . DJD (degenerative joint disease)   . GERD (gastroesophageal reflux disease)   . Hemorrhoids   . History of colonic polyps   . History of DVT (deep vein thrombosis)   . History of kidney stones   . Hyperlipidemia   . Hypertension, benign   . Low back pain syndrome   . Nephrolithiasis   . pulmonary nodule, left lower lobe   . Varicose veins of lower extremities   . Venous insufficiency     Past Surgical History:  Procedure Laterality Date  . APPENDECTOMY    . bilateral inguinal hernia repairs    . CERVICAL DISC ARTHROPLASTY    . CHOLECYSTECTOMY  03/07/2012  . CHOLECYSTECTOMY  N/A 03/07/2012   Procedure: LAPAROSCOPIC CHOLECYSTECTOMY;  Surgeon: Rolm Bookbinder, MD;  Location: [facility];  Service: General;  Laterality: N/A;  . HERNIA REPAIR     ventral hernia with mesh 1990s  . JOINT REPLACEMENT    . left ankle surgery with plate and screws  13/0865   by Dr. Sharol Given  . sebaceous cyst surgery    . t1-t2 disc surgery  2007   by Dr. Louanne Skye  . TONSILLECTOMY    . TOTAL KNEE ARTHROPLASTY     right and left    Family History  Problem Relation Age of Onset  . Gout Father   . Coronary artery disease Father 44    died of massive MI age 25  . Heart attack Father   . CAD Brother 19   Social History:  reports that he has never smoked. He has never used smokeless tobacco. He reports that he does not drink alcohol or use drugs.  Allergies:  Allergies  Allergen Reactions  . Ciprofloxacin     REACTION: rash  . Niacin     REACTION: hypertension, shaky     (Not in a hospital admission)  No results found for this or any previous visit (from the past 48 hour(s)). No results found.  Review of Systems  Constitutional: Negative.   HENT: Negative.   Eyes: Negative.   Respiratory: Negative.   Cardiovascular: Negative.   Gastrointestinal:  Negative.   Genitourinary: Negative.   Musculoskeletal: Positive for back pain.  Skin: Negative.   Neurological: Positive for sensory change and focal weakness.  Psychiatric/Behavioral: Negative.     There were no vitals taken for this visit. Physical Exam  Constitutional: He is oriented to person, place, and time. He appears well-developed. He appears distressed.  HENT:  Head: Normocephalic.  Eyes: Pupils are equal, round, and reactive to light.  Neck: Normal range of motion.  Cardiovascular: Normal rate.   Respiratory: Effort normal.  GI: Soft.  Musculoskeletal:  On exam, healthy, in mild distress. Mood and affect is appropriate. Walks with slight antalgic gait. Straight leg raise, left buttock, thigh, and calf pain, mainly  on the right. He has 4/5 EHL and dorsiflexion on the left compared to the right altering sensation in the L5 dermatome. He may have some slight quad weakness on the left as well. Lumbar spine exam reveals no evidence of soft tissue swelling, deformity or skin ecchymosis. On palpation there is no tenderness of the lumbar spine. No flank pain with percussion. The abdomen is soft and nontender. Nontender over the trochanters. No cellulitis or lymphadenopathy.  Good range of motion of the lumbar spine without associated pain. Motor is 5/5 including tibialis anterior, plantar flexion, quadriceps and hamstrings. Patient is normoreflexic. There is no Babinski or clonus. Sensory exam is intact to light touch. Patient has good distal pulses. No DVT. No pain and normal range of motion without instability of the hips, knees and ankles.  Inspection of the cervical spine reveals a normal lordosis without evidence of paraspinous spasms or soft tissue swelling. Nontender to palpation. Full flexion, full extension, full left and right lateral rotation. Extension combined with lateral flexion does not reproduce pain. Negative impingement sign, negative secondary impingement sign of the shoulders. Negative Tinel's median and ulnar nerves at the elbow. Negative carpal compression test at the wrist. Motor of the upper extremities is 5/5 including biceps, triceps, brachioradialis, wrist flexion, wrist extension, finger flexion, finger extension. Reflexes are normoreflexic. Sensory exam is intact to light touch. There is no Hoffmann sign. Nontender over the thoracic spine.  He reports he has significant decrease in his ambulatory capacity, here with his wife.  He still has lumbar radiculopathy, left L4 and L5 nerve root distribution, some into the right.  Neurological: He is alert and oriented to person, place, and time.    Three view radiographs, AP, lateral, flexion and extension demonstrates multilevel disc degeneration at  L4-5 and at L3-4. Mild scoliosis. 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  Review of his MRI demonstrates multifactorial severe stenosis at L4-5 as well as L3-4. At L5-S1 there is a disc protrusion to the right and lateral facet arthrosis bilaterally. There is some facet arthrosis at L2-3.  Assessment/Plan 1. 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 2. Left lower extremity radicular pain secondary to compression at L4-L5 nerve root, progressive and refractory. 3. Non-insulin dependent diabetes. 4. 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 5. History of urinary retention following surgery.  We had extensive discussion concerning current pathology, relevant anatomy and treatment options, total with the discussion and the examination is 40 minutes. We discussed lumbar decompression given he has lower back pain predominantly claudication type symptoms without instability. We discussed decompression at L3-4 and L4-5, he developed symptoms on the right at L5-S1 as well. I had an extensive discussion of the risks and benefits of the lumbar decompression with the patient including bleeding, infection, damage to neurovascular structures, epidural  fibrosis, CSF leak requiring repair. We also discussed increase in pain, adjacent segment disease, recurrent disc herniation, need for future surgery including repeat decompression and/or fusion. We also discussed risks of postoperative hematoma, paralysis, anesthetic complications including DVT, PE, death, cardiopulmonary dysfunction. In addition, the perioperative and postoperative courses were discussed in detail including the rehabilitative time and return to functional activity and work. I provided the patient with an illustrated handout and utilized the appropriate surgical models.  In addition, we specifically talked about strategies to avoid retention. We discussed intraoperative Foley, bladder scanning following that and use of agent that apparently was  not Flomax and that he Dr. Gaynelle Arabian gave him at the hospital, does not have that they will bring with him to the surgical procedure.  In addition, in terms of his DVT typically he is only is only taking aspirin was in his left distal thigh, did not have a pulmonary embolism. We discussed a baby aspirin. However, we cannot utilize Lovenox for two days prior or for five days following. There are literature that will support for utilizing the baby aspirin perioperatively. We discussed preoperative Doppler as well for a baseline and then a consideration a postoperative Doppler and close monitoring of his symptoms, some TED stocking sequential compression early ambulation and hydration, etc. We spent time discussing all of these issues we will obtain preoperative clearance and proceed accordingly. We discussed timing of that I feel that with progressive deficit, I would recommend sooner decompression at this point in time within a month. Any changes in bowel or bladder function or increasing his deficit in the interim, certainly they are to call and discuss that accordingly.  Plan microlumbar decompression L3-4, L4-5  , Conley Rolls., PA-C for Dr. Tonita Cong 04/22/2016, 5:25 PM

## 2016-05-20 ENCOUNTER — Encounter (HOSPITAL_COMMUNITY): Payer: Self-pay | Admitting: Specialist

## 2016-05-20 DIAGNOSIS — E119 Type 2 diabetes mellitus without complications: Secondary | ICD-10-CM | POA: Diagnosis not present

## 2016-05-20 DIAGNOSIS — M5416 Radiculopathy, lumbar region: Secondary | ICD-10-CM | POA: Diagnosis not present

## 2016-05-20 DIAGNOSIS — F419 Anxiety disorder, unspecified: Secondary | ICD-10-CM | POA: Diagnosis not present

## 2016-05-20 DIAGNOSIS — M48061 Spinal stenosis, lumbar region without neurogenic claudication: Secondary | ICD-10-CM | POA: Diagnosis not present

## 2016-05-20 DIAGNOSIS — K219 Gastro-esophageal reflux disease without esophagitis: Secondary | ICD-10-CM | POA: Diagnosis not present

## 2016-05-20 DIAGNOSIS — E785 Hyperlipidemia, unspecified: Secondary | ICD-10-CM | POA: Diagnosis not present

## 2016-05-20 LAB — BASIC METABOLIC PANEL
ANION GAP: 9 (ref 5–15)
BUN: 14 mg/dL (ref 6–20)
CO2: 23 mmol/L (ref 22–32)
Calcium: 8.8 mg/dL — ABNORMAL LOW (ref 8.9–10.3)
Chloride: 105 mmol/L (ref 101–111)
Creatinine, Ser: 0.93 mg/dL (ref 0.61–1.24)
Glucose, Bld: 200 mg/dL — ABNORMAL HIGH (ref 65–99)
Potassium: 4 mmol/L (ref 3.5–5.1)
Sodium: 137 mmol/L (ref 135–145)

## 2016-05-20 LAB — CBC
HEMATOCRIT: 42.1 % (ref 39.0–52.0)
Hemoglobin: 14.1 g/dL (ref 13.0–17.0)
MCH: 30.5 pg (ref 26.0–34.0)
MCHC: 33.5 g/dL (ref 30.0–36.0)
MCV: 90.9 fL (ref 78.0–100.0)
PLATELETS: 272 10*3/uL (ref 150–400)
RBC: 4.63 MIL/uL (ref 4.22–5.81)
RDW: 13.3 % (ref 11.5–15.5)
WBC: 16.8 10*3/uL — AB (ref 4.0–10.5)

## 2016-05-20 LAB — GLUCOSE, CAPILLARY
Glucose-Capillary: 196 mg/dL — ABNORMAL HIGH (ref 65–99)
Glucose-Capillary: 168 mg/dL — ABNORMAL HIGH (ref 65–99)

## 2016-05-20 NOTE — Brief Op Note (Signed)
05/19/2016  7:18 AM  PATIENT:  Rosetta Posner  71 y.o. male  PRE-OPERATIVE DIAGNOSIS:  Stenosis L3-4, L4-5  POST-OPERATIVE DIAGNOSIS:  Stenosis L3-4, L4-5  PROCEDURE:  Procedure(s): Microlumbar decompression L3-4, L4-5 (N/A)  SURGEON:  Surgeon(s) and Role:    Susa Day, MD - Primary  PHYSICIAN ASSISTANT:   ASSISTANTS: Nehemiah Massed   ANESTHESIA:   general  EBL:  No intake/output data recorded.  BLOOD ADMINISTERED:none  DRAINS: none   LOCAL MEDICATIONS USED:  MARCAINE     SPECIMEN:  No Specimen  DISPOSITION OF SPECIMEN:  N/A  COUNTS:  YES  TOURNIQUET:  * No tourniquets in log *  DICTATION: .Other Dictation: Dictation Number  [id number]  PLAN OF CARE: Admit for overnight observation  PATIENT DISPOSITION:  PACU - hemodynamically stable.   Delay start of Pharmacological VTE agent (>24hrs) due to surgical blood loss or risk of bleeding: yes

## 2016-05-20 NOTE — Op Note (Signed)
[name], [name] NO.:  [id number]  MEDICAL RECORD NO.:  [id number]  LOCATION:                                 FACILITY:  PHYSICIAN:  Susa Day, M.D.         DATE OF BIRTH:  DATE OF PROCEDURE:  05/19/2016 DATE OF DISCHARGE:                              OPERATIVE REPORT   PREOPERATIVE DIAGNOSIS:  Severe spinal stenosis at L3-4 and L4-5.  POSTOPERATIVE DIAGNOSIS:  Severe spinal stenosis at L3-4 and L4-5.  PROCEDURES PERFORMED: 1. Microlumbar decompression, L3-4 and L4-5, with a laminectomy of L4     and bilateral hemilaminotomies at L3-4 and L4-5. 2. Foraminotomies, L4 and L5.  ANESTHESIA:  General.  ASSISTANT:  Nehemiah Massed, PA.  HISTORY:  This is a 71 year old with bilateral lower extremity radicular pain, neurogenic claudication secondary to severe spinal stenosis at L3- L4 and L4, L5, refractory to conservative treatment, is indicated for microlumbar decompression at those levels.  Risk and benefits discussed including bleeding, infection, damage to neurovascular structures, no change in symptoms, worsening symptoms, DVT, PE, and anesthetic complications, etc.  TECHNIQUE:  With the patient in supine position.  After induction of adequate anesthesia and 2 g Kefzol, he was placed prone on the [place] frame.  All bony prominences were well padded.  The lumbar region was prepped and draped in the usual sterile fashion.  Two 18-gauge spinal needles were utilized to localize the L3-4 and L4-5 interspace, confirmed with x-ray.  Incision was made from above L3 to below L4. Subcutaneous tissue was dissected.  Electrocautery was utilized to achieve hemostasis.  Marcaine with epinephrine was injected in the perimuscular tissue.  Dorsolumbar fascia divided in line with the skin incision from L3 to below L5.  Paraspinous muscle was elevated. McCullough retractor was placed.  Operating microscope was draped and brought into the surgical field.  He had  fairly absent interlaminar windows.  This required removal of the spinous processes of L5 and L4 and partial of L3.  There was some scoliosis noted to this as well, and finding the interlaminar window was challenging.  However, we demarcated the inferior aspect of the lamina of L4 with a micro curette.  We used a 2 mm Kerrison then to perform a central laminectomy of L4.  There was severe stenosis noted here bilaterally and facet hypertrophy.  We placed neuro patties beneath the ligamentum flavum.  We decompressed both lateral recesses after performing a central laminectomy at L4.  In addition, from the inferior aspect of L3, we detached the ligamentum flavum.  There was hypertrophic ligamentum and severe ligamentum flavum hypertrophy at L3-4 as well.  There was a rotational deformity noted as well, which was freely compressing the sac, particularly in the lateral recess.  From both sides of the operating room table, we decompressed the lateral recesses to the medial border of the pedicle.  We performed foraminotomies of L4.  There was degenerative hard disk noted at L3-4 and at L4-5 bilaterally, but no soft disk requiring resection.  At the lamina of L5, there was still some stenosis noted into the foramen and detached ligamentum flavum from the cephalad edge of  L5 with a straight curette.  Neuro patties beneath the ligamentum flavum had generalized bleeding throughout the case.  This was handled with intermittent use of FloSeal, bone wax, and thrombin-soaked Gelfoam.  We then performed hemilaminotomies of L5 bilaterally and foraminotomies of L5 bilaterally. Again, severe stenosis was noted into the L5 foramen compressing the L5 roots.  This was relieved.  No disk herniation was noted.  Bipolar electrocautery was utilized to achieve hemostasis.  We had good restoration of thecal sac at L3-4 and at L4-5 and neural probes were placed at L3-4 and then at L5 for localization.  We copiously  irrigated with antibiotic irrigation.  No evidence of CSF leakage or active bleeding.  Thrombin-soaked Gelfoam was placed in the laminotomy defect.  No CSF leakage again or active bleeding.  We removed the McCullough retractor and irrigated the paraspinous musculature.  No active bleeding.  We then closed the dorsolumbar fascia with #1 Vicryl interrupted figure-of-8 sutures oversewed with a Stratafix with a small aperture to allow for drainage.  Irrigation subcu with 2-0, and skin with staples.  Sterile dressing applied, placed supine on the hospital bed, extubated without difficulty, and transported to the recovery room in satisfactory condition.  The patient tolerated the procedure well.  No complications.  Again, Nehemiah Massed, PA, was the assistant.  Technical difficulty was increased due to the patient's scoliosis, the severe spinal stenosis, and the absent interlaminar windows.  Blood loss was approximately 225.     Susa Day, M.D.   ______________________________ Susa Day, M.D.    [name]  D:  05/20/2016  T:  05/20/2016  Job:  [id number]

## 2016-05-20 NOTE — Evaluation (Signed)
Occupational Therapy Evaluation Patient Details Name: [name] MRN: [id number] DOB: [birth year] Today's Date: 05/20/2016    History of Present Illness Pt s/p L3-4, L 4-5 microlumbar decompression and with hx o f BIl TKR   Clinical Impression   This 71 y/o M presents with the above. At baseline Pt is independent with ADLs and functional mobility. Pt currently requires supervision to [place] for functional mobility and [facility] for LB ADLs. Educated Pt and spouse on AE and compensatory techniques for completing ADLs upon return home with Pt verbalizing and/or demonstrating understanding. Questions answered throughout. Spouse will be home to assist Pt with ADLs PRN. Pt reports feeling comfortable completing ADLs after discharge home and with spouse assist. No further OT needs identified at this time. Will sign off.     Follow Up Recommendations  No OT follow up;Supervision - Intermittent    Equipment Recommendations  Tub/shower seat (bariatric shower seat )           Precautions / Restrictions Precautions Precautions: Back Precaution Booklet Issued: Yes (comment) Precaution Comments: handout issued by PT; Pt independently verbalized precautions and reviewed throughout session  Restrictions Weight Bearing Restrictions: No      Mobility Bed Mobility Overal bed mobility: Needs Assistance Bed Mobility:      Supine to sit:     General bed mobility comments: OOB with PT; Pt able to verbalize correct technique for performing log roll   Transfers Overall transfer level: Needs assistance Equipment used: None Transfers: Sit to/from Stand Sit to Stand: Supervision         General transfer comment: cues for positioning, adherence to back precautions                                                ADL either performed or assessed with clinical judgement   ADL Overall ADL's : Needs assistance/impaired Eating/Feeding: Independent;Sitting    Grooming: Oral care;Supervision/safety;Standing Grooming Details (indicate cue type and reason): educated on compensatory techniques for comleting task while adhering to back precautions Upper Body Bathing: Set up;Sitting   Lower Body Bathing: Min guard;Sit to/from stand   Upper Body Dressing : Set up;Sitting   Lower Body Dressing: Minimal assistance;Sit to/from stand Lower Body Dressing Details (indicate cue type and reason): Pt able to demonstrate figure 4 position to complete LB dressing tasks  Toilet Transfer: Min guard;Ambulation;Comfort height toilet   Toileting- Clothing Manipulation and Hygiene: Min guard;Sit to/from Nurse, children's Details (indicate cue type and reason): reviewed safety technique for completing walk-in shower transfer  Functional mobility during ADLs: Min guard General ADL Comments: educated on AE and compensatory techniques for completing ADLs while adhering to back precautions throughout session                         Pertinent Vitals/Pain Pain Assessment: Faces Pain Score: 3  Faces Pain Scale: Hurts a little bit Pain Location: back Pain Descriptors / Indicators: Sore Pain Intervention(s): Limited activity within patient's tolerance;Monitored during session;Premedicated before session     Hand Dominance     Extremity/Trunk Assessment Upper Extremity Assessment Upper Extremity Assessment: Overall WFL for tasks assessed   Lower Extremity Assessment Lower Extremity Assessment: Overall WFL for tasks assessed       Communication Communication Communication: No difficulties   Cognition Arousal/Alertness: Awake/alert Behavior  During Therapy: WFL for tasks assessed/performed Overall Cognitive Status: Within Functional Limits for tasks assessed                                     General Comments                  Home Living Family/patient expects to be discharged to:: Private residence Living  Arrangements: Spouse/significant other Available Help at Discharge: Family Type of Home: House Home Access: Stairs to enter Technical brewer of Steps: 3 Entrance Stairs-Rails: None Home Layout: One level     Bathroom Shower/Tub: Occupational psychologist: Handicapped height     [place]: None          Prior Functioning/Environment Level of Independence: Independent                 OT Problem List: Decreased activity tolerance;Decreased strength;Decreased knowledge of precautions      OT Treatment/Interventions:      OT Goals(Current goals can be found in the care plan section) Acute Rehab OT Goals Patient Stated Goal: Regain IND  OT Goal Formulation: With patient                                 AM-PAC PT "6 Clicks" Daily Activity     Outcome Measure Help from another person eating meals?: None Help from another person taking care of personal grooming?: A Little Help from another person toileting, which includes using toliet, bedpan, or urinal?: A Little Help from another person bathing (including washing, rinsing, drying)?: A Little Help from another person to put on and taking off regular upper body clothing?: A Little Help from another person to put on and taking off regular lower body clothing?: A Little 6 Click Score: 19   End of Session Equipment Utilized During Treatment: Gait belt Nurse Communication: Other (comment) (DME needs)  Activity Tolerance: Patient tolerated treatment well Patient left: in chair;with call bell/phone within reach;with family/visitor present  OT Visit Diagnosis: Muscle weakness (generalized) (M62.81)                Time: 7096-2836 OT Time Calculation (min): 28 min Charges:  OT General Charges $OT Visit: 1 Procedure OT Evaluation $OT Eval Low Complexity: 1 Procedure OT Treatments $Self Care/Home Management : 8-22 mins G-Codes: OT G-codes **NOT FOR INPATIENT CLASS** Functional Assessment Tool  Used: AM-PAC 6 Clicks Daily Activity;Clinical judgement Functional Limitation: Self care Self Care Current Status (O2947): At least 20 percent but less than 40 percent impaired, limited or restricted Self Care Goal Status (M5465): At least 20 percent but less than 40 percent impaired, limited or restricted Self Care Discharge Status [id number]): At least 20 percent but less than 40 percent impaired, limited or restricted   Lou Cal, OT Pager [phone] 05/20/2016   Raymondo Band 05/20/2016, 1:44 PM

## 2016-05-20 NOTE — Discharge Summary (Signed)
Patient ID: [name] MRN: [id number] DOB/AGE: [birth year] 72 y.o.  Admit date: 05/19/2016 Discharge date: 05/20/2016  Admission Diagnoses:  Active Problems:   Spinal stenosis at L4-L5 level   Discharge Diagnoses:  Same  Past Medical History:  Diagnosis Date  . Anxiety   . BPH (benign prostatic hypertrophy) with urinary obstruction   . Cholelithiasis   . Clotting disorder ([place])   . Complication of anesthesia    urinary retention ; "bladder didnt wake up"; says last gallbladder surgery he shouldve been cathed with his med hx  . Diabetes mellitus   . Diverticulosis of colon   . DJD (degenerative joint disease)   . GERD (gastroesophageal reflux disease)   . Hemorrhoids   . History of colonic polyps   . History of DVT (deep vein thrombosis)   . History of kidney stones   . Hyperlipidemia   . Hypertension, benign   . Low back pain syndrome   . Nephrolithiasis   . pulmonary nodule, left lower lobe   . Varicose veins of lower extremities   . Venous insufficiency     Surgeries: Procedure(s): Microlumbar decompression L3-4, L4-5 on 05/19/2016   Consultants:   Discharged Condition: Improved  Hospital Course: [name] is an 71 y.o. male who was admitted 05/19/2016 for operative treatment of<principal problem not specified>. Patient has severe unremitting pain that affects sleep, daily activities, and work/hobbies. After pre-op clearance the patient was taken to the operating room on 05/19/2016 and underwent  Procedure(s): Microlumbar decompression L3-4, L4-5.    Patient was given perioperative antibiotics: Anti-infectives    Start     Dose/Rate Route Frequency Ordered Stop   05/19/16 1800  ceFAZolin (ANCEF) IVPB 2g/100 mL premix     2 g 200 mL/hr over 30 Minutes Intravenous Every 8 hours 05/19/16 1729 05/20/16 1017   05/19/16 1403  polymyxin B 500,000 Units, bacitracin 50,000 Units in sodium chloride irrigation 0.9 % 500 mL irrigation  Status:  Discontinued       As  needed 05/19/16 1403 05/19/16 1611   05/19/16 0930  ceFAZolin (ANCEF) IVPB 2g/100 mL premix     2 g 200 mL/hr over 30 Minutes Intravenous On call to O.R. 05/19/16 6283 05/19/16 1348       Patient was given sequential compression devices, early ambulation, and chemoprophylaxis to prevent DVT. Patient was able to void without difficulty after foley d/c.  Patient benefited maximally from hospital stay and there were no complications.    Recent vital signs: Patient Vitals for the past 24 hrs:  BP Temp Temp src Pulse Resp SpO2  05/20/16 1321 134/68 98.8 F (37.1 C) Oral (!) 102 15 95 %  05/20/16 0925 (!) 145/72 98.4 F (36.9 C) Oral (!) 108 16 97 %  05/20/16 0554 128/77 98.2 F (36.8 C) Oral 92 14 90 %  05/20/16 0205 (!) 146/77 98.7 F (37.1 C) Oral 91 14 92 %  05/19/16 2250 (!) 147/82 98.4 F (36.9 C) Oral (!) 105 16 92 %  05/19/16 1957 (!) 155/76 98.6 F (37 C) Oral (!) 111 15 92 %  05/19/16 1905 (!) 142/90 97.6 F (36.4 C) Oral (!) 111 15 93 %  05/19/16 1813 (!) 142/79 98.5 F (36.9 C) Axillary (!) 105 15 93 %  05/19/16 1705 (!) 160/76 97.6 F (36.4 C) - 100 15 94 %  05/19/16 1645 127/83 - - (!) 102 14 92 %  05/19/16 1630 129/75 - - 98 17 93 %  05/19/16 1615 (!)  124/96 - - (!) 104 18 96 %  05/19/16 1614 131/77 97.5 F (36.4 C) - (!) 103 20 93 %     Recent laboratory studies:  Recent Labs  05/20/16 0400  WBC 16.8*  HGB 14.1  HCT 42.1  PLT 272  NA 137  K 4.0  CL 105  CO2 23  BUN 14  CREATININE 0.93  GLUCOSE 200*  CALCIUM 8.8*     Discharge Medications:   Allergies as of 05/20/2016      Reactions   Ciprofloxacin    REACTION: rash   Niacin    REACTION: hypotension, shaky      Medication List    STOP taking these medications   diclofenac 75 MG EC tablet Commonly known as:  VOLTAREN     TAKE these medications   acetaminophen 500 MG tablet Commonly known as:  TYLENOL Take 1,000 mg by mouth every 6 (six) hours as needed for mild pain.   aspirin EC  81 MG tablet Take 81 mg by mouth daily.   cetirizine 10 MG tablet Commonly known as:  ZYRTEC Take 10 mg by mouth daily.   docusate sodium 100 MG capsule Commonly known as:  COLACE Take 1 capsule (100 mg total) by mouth 2 (two) times daily as needed for mild constipation.   glimepiride 2 MG tablet Commonly known as:  AMARYL TAKE 1 TABLET BY MOUTH DAILY BEFORE BREAKFAST What changed:  how much to take  how to take this  when to take this  additional instructions   losartan 100 MG tablet Commonly known as:  COZAAR TAKE 1 TABLET (100 MG TOTAL) BY MOUTH DAILY.   metFORMIN 500 MG tablet Commonly known as:  GLUCOPHAGE TAKE 1 TABLET BY MOUTH TWICE A DAY WITH A MEAL What changed:  how much to take  how to take this  when to take this  additional instructions   multivitamin per tablet Take 1 tablet by mouth daily.   oxyCODONE-acetaminophen 5-325 MG tablet Commonly known as:  PERCOCET Take 1-2 tablets by mouth every 4 (four) hours as needed for severe pain.   pantoprazole 40 MG tablet Commonly known as:  PROTONIX TAKE 1 TABLET (40 MG TOTAL) BY MOUTH 2 (TWO) TIMES DAILY. What changed:  how much to take  how to take this  when to take this  additional instructions   polyethylene glycol packet Commonly known as:  MIRALAX / GLYCOLAX Take 17 g by mouth daily.   ranitidine 300 MG tablet Commonly known as:  ZANTAC Take 1 tablet (300 mg total) by mouth at bedtime.   RAPAFLO 8 MG Caps capsule Generic drug:  silodosin Take 8 mg by mouth daily.   simvastatin 40 MG tablet Commonly known as:  ZOCOR TAKE 1 TABLET (40 MG TOTAL) BY MOUTH AT BEDTIME.            Durable Medical Equipment        Start     Ordered   05/20/16 1218  For home use only DME Shower stool  Once     05/20/16 1217      Diagnostic Studies: Dg Lumbar Spine 2-3 Views  Result Date: 05/12/2016 CLINICAL DATA:  Preoperative exam prior to lumbar spine surgery. EXAM: LUMBAR SPINE - 2-3  VIEW COMPARISON:  Reconstructed images through the lumbar spine from an abdominal and pelvic CT scan of September 30, 2014. FINDINGS: There is minimal levocurvature centered at L2-3 which is stable. The lumbar vertebral bodies are preserved in height. There  is moderate disc space narrowing at L3-4, L4-5, and L5-S1. There are anterior bridging osteophytes at T12-L1 and L1-L2 with near bridging osteophytes at L2-3 and L3-4. There is minimal anterolisthesis of L4 with respect L3. There is facet joint hypertrophy at L4-5 and L5-S1. The pedicles and transverse processes are intact where visualized. The observed portions of the sacrum are normal. IMPRESSION: Moderate degenerative disc disease at multiple levels. Minimal grade 1 anterolisthesis of L4 with respect L3 likely on the basis of degenerative disc disease. The pedicles are intact. Electronically Signed   By: David  Martinique M.D.   On: 05/12/2016 11:12   Dg Spine Portable 1 View  Result Date: 05/19/2016 CLINICAL DATA:  Intraoperative images for micro lumbar decompression. EXAM: PORTABLE SPINE - 1 VIEW COMPARISON:  Earlier today FINDINGS: Lateral portable radiograph of the lumbar spine obtained in the operating room was annotated using the same numbering scheme as the previous radiograph. Tissue spreaders are identified posterior to the L4-5 disc space. There is a surgical probe overlying the pedicle of L4 and a second probe overlying the pedicle of L5. Multi level disc space narrowing and ventral spurring is identified. IMPRESSION: 1. Intraoperative radiographs have been annotated using the same numbering scheme as previously. 2. Surgical probes localize the posterior elements of L4 and L5. Electronically Signed   By: Kerby Moors M.D.   On: 05/19/2016 15:40   Dg Spine Portable 1 View  Result Date: 05/19/2016 CLINICAL DATA:  Elective surgery at L3-4 and L4-5 EXAM: PORTABLE SPINE - 1 VIEW COMPARISON:  Preoperative radiography 05/12/2016 FINDINGS: Spinous  processes were labeled on the image as requested. Spinal numbering as on preoperative study and comparison earlier today. Retractors centered over the L4 spinous process. There is a sponge present. Clamps overlap the L3-4 interspinous space and L5-S1 interspinous space. IMPRESSION: Intraoperative numbering as described and labeled. Electronically Signed   By: Monte Fantasia M.D.   On: 05/19/2016 14:07   Dg Spine Portable 1 View  Result Date: 05/19/2016 CLINICAL DATA:  Spine surgery. EXAM: PORTABLE SPINE - 1 VIEW COMPARISON:  05/12/2016. FINDINGS: Lumbar spine numbered as per prior exam. Metallic markers are noted posteriorly at the L4 and L5 levels. IMPRESSION: Metallic markers are noted posteriorly at the L4 and L5 levels. Electronically Signed   By: Marcello Moores  Register   On: 05/19/2016 13:53    Disposition: 01-Home or Self Care  Discharge Instructions    CPM    Complete by:  As directed    Continuous passive motion machine (CPM):      Use the CPM from 0 to 60 for 6-8 hours per day.      You may increase by 5-10 per day.  You may break it up into 2 or 3 sessions per day.      Use CPM for 3-4 weeks or until you are told to stop.   Call MD / Call 911    Complete by:  As directed    If you experience chest pain or shortness of breath, CALL 911 and be transported to the hospital emergency room.  If you develope a fever above 101 F, pus (white drainage) or increased drainage or redness at the wound, or calf pain, call your surgeon's office.   Change dressing    Complete by:  As directed    Change the dressing daily with sterile 4 x 4 inch gauze dressing and apply TED hose.  You may clean the incision with alcohol prior to redressing.   Constipation  Prevention    Complete by:  As directed    Drink plenty of fluids.  Prune juice and/or coffee may be helpful.  You may use a stool softener, such as Colace (over the counter) 100 mg twice a day.  Use MiraLax (over the counter) for constipation as needed  but this may take several days to work.  Mag Citrate --OR-- Milk of Magnesia may also be used but follow directions on the label.   Diet - low sodium heart healthy    Complete by:  As directed    Discharge instructions    Complete by:  As directed    Walk As Tolerated utilizing back precautions.  No bending, twisting, or lifting.  No driving for 2 weeks.   Aquacel dressing may remain in place until follow up. May shower with aquacel dressing in place. If the dressing peels off or becomes saturated, you may remove aquacel dressing and place gauze and tape dressing which should be kept clean and dry and changed daily. Do not remove steri-strips if they are present. See Dr. Tonita Cong in office in 10 to 14 days. Begin taking aspirin 81mg  per day starting 4 days after your surgery if not allergic to aspirin or on another blood thinner. Walk daily even outside. Use a cane or walker only if necessary. Avoid sitting on soft sofas.   Do not put a pillow under the knee. Place it under the heel.    Complete by:  As directed    Place yellow block under heel at all times except when up walking or in CPM.  You must sleep in it at night   Driving restrictions    Complete by:  As directed    No driving until follow up, then as instucted   Increase activity slowly as tolerated    Complete by:  As directed    Lifting restrictions    Complete by:  As directed    No lifting, bending or twisting until follow up, then as instructed   Patient may shower    Complete by:  As directed    You may shower over the brown dressing.  Once the dressing is removed you may shower without a dressing once there is no drainage.  Do not wash over the wound.  If drainage remains, cover wound with plastic wrap and then shower   TED hose    Complete by:  As directed    Use stockings (TED hose) for 2 weeks on both leg(s).  You may remove them at night for sleeping.      Follow-up Information    Susa Day, MD Follow up in 2  week(s).   Specialty:  Orthopedic Surgery Contact information: [address] [place] [address] [phone]            Signed: Nehemiah Massed 05/20/2016, 1:29 PM

## 2016-05-20 NOTE — Evaluation (Signed)
Physical Therapy Evaluation Patient Details Name: [name] MRN: [id number] DOB: 11-24-1945 Today's Date: 05/20/2016   History of Present Illness  Pt s/p L3-4, L 4-5 microlumbar decompression and with hx o f BIl TKR  Clinical Impression  Pt s/p back surgery and presents with functional mobility limitations 2* post op pain and back precautions.  Pt should progress to dc home with family assist.    Follow Up Recommendations No PT follow up    Equipment Recommendations  None recommended by PT    Recommendations for Other Services OT consult     Precautions / Restrictions Precautions Precautions: Back Precaution Booklet Issued: Yes (comment) Precaution Comments: Precautions reviewed x 3 Restrictions Weight Bearing Restrictions: No      Mobility  Bed Mobility Overal bed mobility: Needs Assistance Bed Mobility: Supine to Sit     Supine to sit: Min guard;Supervision     General bed mobility comments: cues for correct log roll technique and transition to sitting with adherence to back precautions  Transfers Overall transfer level: Needs assistance Equipment used: None Transfers: Sit to/from Stand Sit to Stand: Min guard;Supervision         General transfer comment: cues for transition position, adherence to back precautions and use of UEs to self assist  Ambulation/Gait Ambulation/Gait assistance: Min guard;Supervision Ambulation Distance (Feet): 800 Feet Assistive device: None Gait Pattern/deviations: Step-through pattern;WFL(Within Functional Limits) Gait velocity: decr Gait velocity interpretation: Below normal speed for age/gender General Gait Details: slightly guarded with increased BOS but no balance loss or instability noted  Stairs Stairs: Yes Stairs assistance: Min guard Stair Management: No rails;Forwards;Step to pattern Number of Stairs: 4 General stair comments: min cues for sequence  Wheelchair Mobility    Modified Rankin (Stroke Patients  Only)       Balance                                             Pertinent Vitals/Pain Pain Assessment: 0-10 Pain Score: 3  Pain Location: back Pain Descriptors / Indicators: Sore Pain Intervention(s): Limited activity within patient's tolerance;Monitored during session;Premedicated before session    Home Living Family/patient expects to be discharged to:: Private residence Living Arrangements: Spouse/significant other Available Help at Discharge: Family Type of Home: House Home Access: Stairs to enter Entrance Stairs-Rails: None Technical brewer of Steps: 3 Home Layout: One level Home Equipment: None      Prior Function Level of Independence: Independent               Hand Dominance        Extremity/Trunk Assessment   Upper Extremity Assessment Upper Extremity Assessment: Overall WFL for tasks assessed    Lower Extremity Assessment Lower Extremity Assessment: Overall WFL for tasks assessed       Communication   Communication: No difficulties  Cognition Arousal/Alertness: Awake/alert Behavior During Therapy: WFL for tasks assessed/performed Overall Cognitive Status: Within Functional Limits for tasks assessed                                        General Comments      Exercises     Assessment/Plan    PT Assessment Patient needs continued PT services  PT Problem List Decreased activity tolerance;Decreased mobility;Decreased knowledge of use of DME;Obesity;Pain;Decreased knowledge of precautions  PT Treatment Interventions DME instruction;Gait training;Stair training;Functional mobility training;Therapeutic activities;Therapeutic exercise;Patient/family education    PT Goals (Current goals can be found in the Care Plan section)  Acute Rehab PT Goals Patient Stated Goal: Regain IND  PT Goal Formulation: With patient Time For Goal Achievement: 05/22/16 Potential to Achieve Goals: Good     Frequency Min 6X/week   Barriers to discharge        Co-evaluation               AM-PAC PT "6 Clicks" Daily Activity  Outcome Measure Difficulty turning over in bed (including adjusting bedclothes, sheets and blankets)?: A Little Difficulty moving from lying on back to sitting on the side of the bed? : A Little Difficulty sitting down on and standing up from a chair with arms (e.g., wheelchair, bedside commode, etc,.)?: A Little Help needed moving to and from a bed to chair (including a wheelchair)?: A Little Help needed walking in hospital room?: A Little Help needed climbing 3-5 steps with a railing? : A Little 6 Click Score: 18    End of Session   Activity Tolerance: Patient tolerated treatment well Patient left: in chair;with call bell/phone within reach;with family/visitor present Nurse Communication: Mobility status PT Visit Diagnosis: Unsteadiness on feet (R26.81);Difficulty in walking, not elsewhere classified (R26.2)    Time: 5072-2575 PT Time Calculation (min) (ACUTE ONLY): 22 min   Charges:   PT Evaluation $PT Eval Low Complexity: 1 Procedure     PT G Codes:   PT G-Codes **NOT FOR INPATIENT CLASS** Functional Assessment Tool Used: Clinical judgement Functional Limitation: Mobility: Walking and moving around Mobility: Walking and Moving Around Current Status (Y5183): At least 1 percent but less than 20 percent impaired, limited or restricted Mobility: Walking and Moving Around Goal Status [phone]): At least 1 percent but less than 20 percent impaired, limited or restricted    Pg [phone]   , 05/20/2016, 12:28 PM

## 2016-05-20 NOTE — Progress Notes (Signed)
Subjective: 1 Day Post-Op Procedure(s) (LRB): Microlumbar decompression L3-4, L4-5 (N/A) Patient reports pain as 3 on 0-10 scale.   No leg pain.  Objective: Vital signs in last 24 hours: Temp:  [97.5 F (36.4 C)-98.7 F (37.1 C)] 98.2 F (36.8 C) (05/17 0554) Pulse Rate:  [18-111] 92 (05/17 0554) Resp:  [14-20] 14 (05/17 0554) BP: (124-160)/(75-96) 128/77 (05/17 0554) SpO2:  [90 %-100 %] 90 % (05/17 0554) Weight:  [113.9 kg (251 lb)] 113.9 kg (251 lb) (05/16 0948)  Intake/Output from previous day: 05/16 0701 - 05/17 0700 In: 4270 [P.O.:1020; I.V.:3050; IV Piggyback:200] Out: 3000 [Urine:2750; Blood:250] Intake/Output this shift: No intake/output data recorded.   Recent Labs  05/20/16 0400  HGB 14.1    Recent Labs  05/20/16 0400  WBC 16.8*  RBC 4.63  HCT 42.1  PLT 272    Recent Labs  05/20/16 0400  NA 137  K 4.0  CL 105  CO2 23  BUN 14  CREATININE 0.93  GLUCOSE 200*  CALCIUM 8.8*   No results for input(s): LABPT, INR in the last 72 hours.  Neurologically intact Neurovascular intact Sensation intact distally Intact pulses distally Incision: dressing C/D/I  Assessment/Plan: No pain 1 Day Post-Op Procedure(s) (LRB): Microlumbar decompression L3-4, L4-5 (N/A) Advance diet Up with therapy D/C IV fluids Discharge home with home health maybe today. Instructions given. D/C foley. Needs to void before noon. [place] per his Urologist.  Susa Day C 05/20/2016, 7:15 AM

## 2016-05-20 NOTE — Discharge Summary (Deleted)
Patient ID: [name] MRN: [id number] DOB/AGE: [birth year] 71 y.o.  Admit date: 05/19/2016 Discharge date: 05/20/2016  Admission Diagnoses:  Active Problems:   Spinal stenosis at L4-L5 level   Discharge Diagnoses:  Same  Past Medical History:  Diagnosis Date  . Anxiety   . BPH (benign prostatic hypertrophy) with urinary obstruction   . Cholelithiasis   . Clotting disorder ([place])   . Complication of anesthesia    urinary retention ; "bladder didnt wake up"; says last gallbladder surgery he shouldve been cathed with his med hx  . Diabetes mellitus   . Diverticulosis of colon   . DJD (degenerative joint disease)   . GERD (gastroesophageal reflux disease)   . Hemorrhoids   . History of colonic polyps   . History of DVT (deep vein thrombosis)   . History of kidney stones   . Hyperlipidemia   . Hypertension, benign   . Low back pain syndrome   . Nephrolithiasis   . pulmonary nodule, left lower lobe   . Varicose veins of lower extremities   . Venous insufficiency     Surgeries: Procedure(s): Microlumbar decompression L3-4, L4-5 on 05/19/2016   Consultants:   Discharged Condition: Improved  Hospital Course: [name] is an 71 y.o. male who was admitted 05/19/2016 for operative treatment of<principal problem not specified>. Patient has severe unremitting pain that affects sleep, daily activities, and work/hobbies. After pre-op clearance the patient was taken to the operating room on 05/19/2016 and underwent  Procedure(s): Microlumbar decompression L3-4, L4-5.    Patient was given perioperative antibiotics: Anti-infectives    Start     Dose/Rate Route Frequency Ordered Stop   05/19/16 1800  ceFAZolin (ANCEF) IVPB 2g/100 mL premix     2 g 200 mL/hr over 30 Minutes Intravenous Every 8 hours 05/19/16 1729 05/20/16 1017   05/19/16 1403  polymyxin B 500,000 Units, bacitracin 50,000 Units in sodium chloride irrigation 0.9 % 500 mL irrigation  Status:  Discontinued       As  needed 05/19/16 1403 05/19/16 1611   05/19/16 0930  ceFAZolin (ANCEF) IVPB 2g/100 mL premix     2 g 200 mL/hr over 30 Minutes Intravenous On call to O.R. 05/19/16 3825 05/19/16 1348       Patient was given sequential compression devices, early ambulation, and chemoprophylaxis to prevent DVT.  Patient benefited maximally from hospital stay and there were no complications.  Voided without difficulty after d/c foley.  Recent vital signs: Patient Vitals for the past 24 hrs:  BP Temp Temp src Pulse Resp SpO2  05/20/16 0925 (!) 145/72 98.4 F (36.9 C) Oral (!) 108 16 97 %  05/20/16 0554 128/77 98.2 F (36.8 C) Oral 92 14 90 %  05/20/16 0205 (!) 146/77 98.7 F (37.1 C) Oral 91 14 92 %  05/19/16 2250 (!) 147/82 98.4 F (36.9 C) Oral (!) 105 16 92 %  05/19/16 1957 (!) 155/76 98.6 F (37 C) Oral (!) 111 15 92 %  05/19/16 1905 (!) 142/90 97.6 F (36.4 C) Oral (!) 111 15 93 %  05/19/16 1813 (!) 142/79 98.5 F (36.9 C) Axillary (!) 105 15 93 %  05/19/16 1705 (!) 160/76 97.6 F (36.4 C) - 100 15 94 %  05/19/16 1645 127/83 - - (!) 102 14 92 %  05/19/16 1630 129/75 - - 98 17 93 %  05/19/16 1615 (!) 124/96 - - (!) 104 18 96 %  05/19/16 1614 131/77 97.5 F (36.4 C) - (!) 103  20 93 %     Recent laboratory studies:  Recent Labs  05/20/16 0400  WBC 16.8*  HGB 14.1  HCT 42.1  PLT 272  NA 137  K 4.0  CL 105  CO2 23  BUN 14  CREATININE 0.93  GLUCOSE 200*  CALCIUM 8.8*     Discharge Medications:     Diagnostic Studies: Dg Lumbar Spine 2-3 Views  Result Date: 05/12/2016 CLINICAL DATA:  Preoperative exam prior to lumbar spine surgery. EXAM: LUMBAR SPINE - 2-3 VIEW COMPARISON:  Reconstructed images through the lumbar spine from an abdominal and pelvic CT scan of September 30, 2014. FINDINGS: There is minimal levocurvature centered at L2-3 which is stable. The lumbar vertebral bodies are preserved in height. There is moderate disc space narrowing at L3-4, L4-5, and L5-S1. There are  anterior bridging osteophytes at T12-L1 and L1-L2 with near bridging osteophytes at L2-3 and L3-4. There is minimal anterolisthesis of L4 with respect L3. There is facet joint hypertrophy at L4-5 and L5-S1. The pedicles and transverse processes are intact where visualized. The observed portions of the sacrum are normal. IMPRESSION: Moderate degenerative disc disease at multiple levels. Minimal grade 1 anterolisthesis of L4 with respect L3 likely on the basis of degenerative disc disease. The pedicles are intact. Electronically Signed   By: David  Martinique M.D.   On: 05/12/2016 11:12   Dg Spine Portable 1 View  Result Date: 05/19/2016 CLINICAL DATA:  Intraoperative images for micro lumbar decompression. EXAM: PORTABLE SPINE - 1 VIEW COMPARISON:  Earlier today FINDINGS: Lateral portable radiograph of the lumbar spine obtained in the operating room was annotated using the same numbering scheme as the previous radiograph. Tissue spreaders are identified posterior to the L4-5 disc space. There is a surgical probe overlying the pedicle of L4 and a second probe overlying the pedicle of L5. Multi level disc space narrowing and ventral spurring is identified. IMPRESSION: 1. Intraoperative radiographs have been annotated using the same numbering scheme as previously. 2. Surgical probes localize the posterior elements of L4 and L5. Electronically Signed   By: Kerby Moors M.D.   On: 05/19/2016 15:40   Dg Spine Portable 1 View  Result Date: 05/19/2016 CLINICAL DATA:  Elective surgery at L3-4 and L4-5 EXAM: PORTABLE SPINE - 1 VIEW COMPARISON:  Preoperative radiography 05/12/2016 FINDINGS: Spinous processes were labeled on the image as requested. Spinal numbering as on preoperative study and comparison earlier today. Retractors centered over the L4 spinous process. There is a sponge present. Clamps overlap the L3-4 interspinous space and L5-S1 interspinous space. IMPRESSION: Intraoperative numbering as described and  labeled. Electronically Signed   By: Monte Fantasia M.D.   On: 05/19/2016 14:07   Dg Spine Portable 1 View  Result Date: 05/19/2016 CLINICAL DATA:  Spine surgery. EXAM: PORTABLE SPINE - 1 VIEW COMPARISON:  05/12/2016. FINDINGS: Lumbar spine numbered as per prior exam. Metallic markers are noted posteriorly at the L4 and L5 levels. IMPRESSION: Metallic markers are noted posteriorly at the L4 and L5 levels. Electronically Signed   By: Marcello Moores  Register   On: 05/19/2016 13:53    Disposition: 01-Home or Self Care    Follow-up Information    Susa Day, MD In 2 weeks.   Specialty:  Orthopedic Surgery Contact information: [address] [place] [address] [phone]            Signed: Nehemiah Massed 05/20/2016, 1:04 PM

## 2016-05-20 NOTE — Progress Notes (Signed)
    Durable Medical Equipment        Start     Ordered   05/20/16 1218  For home use only DME Shower stool  Once     05/20/16 1217    [id number]

## 2016-06-04 DIAGNOSIS — Z9889 Other specified postprocedural states: Secondary | ICD-10-CM | POA: Diagnosis not present

## 2016-06-04 DIAGNOSIS — Z4789 Encounter for other orthopedic aftercare: Secondary | ICD-10-CM | POA: Diagnosis not present

## 2016-06-10 DIAGNOSIS — M5416 Radiculopathy, lumbar region: Secondary | ICD-10-CM | POA: Diagnosis not present

## 2016-06-11 NOTE — Addendum Note (Signed)
Addendum  created 06/11/16 1241 by Lyn Hollingshead, MD   Sign clinical note

## 2016-06-14 DIAGNOSIS — M5416 Radiculopathy, lumbar region: Secondary | ICD-10-CM | POA: Diagnosis not present

## 2016-06-16 DIAGNOSIS — N312 Flaccid neuropathic bladder, not elsewhere classified: Secondary | ICD-10-CM | POA: Diagnosis not present

## 2016-06-16 DIAGNOSIS — N200 Calculus of kidney: Secondary | ICD-10-CM | POA: Diagnosis not present

## 2016-06-16 DIAGNOSIS — R351 Nocturia: Secondary | ICD-10-CM | POA: Diagnosis not present

## 2016-06-16 DIAGNOSIS — N401 Enlarged prostate with lower urinary tract symptoms: Secondary | ICD-10-CM | POA: Diagnosis not present

## 2016-06-16 DIAGNOSIS — R972 Elevated prostate specific antigen [PSA]: Secondary | ICD-10-CM | POA: Diagnosis not present

## 2016-06-17 DIAGNOSIS — M5416 Radiculopathy, lumbar region: Secondary | ICD-10-CM | POA: Diagnosis not present

## 2016-06-18 LAB — HM DIABETES EYE EXAM

## 2016-06-21 DIAGNOSIS — M5416 Radiculopathy, lumbar region: Secondary | ICD-10-CM | POA: Diagnosis not present

## 2016-06-22 DIAGNOSIS — D2371 Other benign neoplasm of skin of right lower limb, including hip: Secondary | ICD-10-CM | POA: Diagnosis not present

## 2016-06-22 DIAGNOSIS — I788 Other diseases of capillaries: Secondary | ICD-10-CM | POA: Diagnosis not present

## 2016-06-22 DIAGNOSIS — L720 Epidermal cyst: Secondary | ICD-10-CM | POA: Diagnosis not present

## 2016-06-22 DIAGNOSIS — D225 Melanocytic nevi of trunk: Secondary | ICD-10-CM | POA: Diagnosis not present

## 2016-06-22 DIAGNOSIS — D1801 Hemangioma of skin and subcutaneous tissue: Secondary | ICD-10-CM | POA: Diagnosis not present

## 2016-06-22 DIAGNOSIS — L821 Other seborrheic keratosis: Secondary | ICD-10-CM | POA: Diagnosis not present

## 2016-06-23 DIAGNOSIS — M5416 Radiculopathy, lumbar region: Secondary | ICD-10-CM | POA: Diagnosis not present

## 2016-06-28 DIAGNOSIS — Z9889 Other specified postprocedural states: Secondary | ICD-10-CM | POA: Diagnosis not present

## 2016-06-28 DIAGNOSIS — Z4789 Encounter for other orthopedic aftercare: Secondary | ICD-10-CM | POA: Diagnosis not present

## 2016-06-28 DIAGNOSIS — M5416 Radiculopathy, lumbar region: Secondary | ICD-10-CM | POA: Diagnosis not present

## 2016-07-13 ENCOUNTER — Ambulatory Visit (INDEPENDENT_AMBULATORY_CARE_PROVIDER_SITE_OTHER): Payer: Medicare Other | Admitting: Family Medicine

## 2016-07-13 ENCOUNTER — Encounter: Payer: Self-pay | Admitting: Family Medicine

## 2016-07-13 VITALS — BP 133/83 | HR 90 | Resp 16 | Ht 69.5 in | Wt 245.1 lb

## 2016-07-13 DIAGNOSIS — E119 Type 2 diabetes mellitus without complications: Secondary | ICD-10-CM | POA: Diagnosis not present

## 2016-07-13 DIAGNOSIS — I10 Essential (primary) hypertension: Secondary | ICD-10-CM | POA: Diagnosis not present

## 2016-07-13 DIAGNOSIS — E785 Hyperlipidemia, unspecified: Secondary | ICD-10-CM

## 2016-07-13 LAB — HEPATIC FUNCTION PANEL
ALK PHOS: 49 U/L (ref 39–117)
ALT: 31 U/L (ref 0–53)
AST: 19 U/L (ref 0–37)
Albumin: 4.1 g/dL (ref 3.5–5.2)
BILIRUBIN TOTAL: 0.5 mg/dL (ref 0.2–1.2)
Bilirubin, Direct: 0.1 mg/dL (ref 0.0–0.3)
Total Protein: 6.5 g/dL (ref 6.0–8.3)

## 2016-07-13 LAB — BASIC METABOLIC PANEL
BUN: 19 mg/dL (ref 6–23)
CO2: 24 meq/L (ref 19–32)
Calcium: 9.3 mg/dL (ref 8.4–10.5)
Chloride: 107 mEq/L (ref 96–112)
Creatinine, Ser: 1 mg/dL (ref 0.40–1.50)
GFR: 78.17 mL/min (ref >60.00)
GLUCOSE: 127 mg/dL — AB (ref 70–99)
POTASSIUM: 4.3 meq/L (ref 3.5–5.1)
SODIUM: 142 meq/L (ref 135–145)

## 2016-07-13 LAB — LIPID PANEL
CHOLESTEROL: 113 mg/dL (ref 0–200)
HDL: 37.3 mg/dL — ABNORMAL LOW (ref >39.00)
LDL Cholesterol: 51 mg/dL (ref 0–99)
NONHDL: 75.72
Total CHOL/HDL Ratio: 3
Triglycerides: 124 mg/dL (ref 0.0–149.0)
VLDL: 24.8 mg/dL (ref 0.0–40.0)

## 2016-07-13 LAB — HEMOGLOBIN A1C: HEMOGLOBIN A1C: 6.5 % (ref 4.6–6.5)

## 2016-07-13 LAB — CBC WITH DIFFERENTIAL/PLATELET
BASOS ABS: 0 10*3/uL (ref 0.0–0.1)
BASOS PCT: 0.2 % (ref 0.0–3.0)
Eosinophils Absolute: 0.2 10*3/uL (ref 0.0–0.7)
Eosinophils Relative: 3.4 % (ref 0.0–5.0)
HEMATOCRIT: 45.4 % (ref 39.0–52.0)
Hemoglobin: 15 g/dL (ref 13.0–17.0)
LYMPHS ABS: 1.8 10*3/uL (ref 0.7–4.0)
LYMPHS PCT: 25.7 % (ref 12.0–46.0)
MCHC: 32.9 g/dL (ref 30.0–36.0)
MCV: 90.2 fl (ref 78.0–100.0)
MONOS PCT: 10.3 % (ref 3.0–12.0)
Monocytes Absolute: 0.7 10*3/uL (ref 0.1–1.0)
NEUTROS ABS: 4.2 10*3/uL (ref 1.4–7.7)
NEUTROS PCT: 60.4 % (ref 43.0–77.0)
PLATELETS: 300 10*3/uL (ref 150.0–400.0)
RBC: 5.03 Mil/uL (ref 4.22–5.81)
RDW: 13.9 % (ref 11.5–15.5)
WBC: 7 10*3/uL (ref 4.0–10.5)

## 2016-07-13 LAB — TSH: TSH: 1.02 u[IU]/mL (ref 0.35–4.50)

## 2016-07-13 MED ORDER — SIMVASTATIN 40 MG PO TABS: ORAL_TABLET | ORAL | 1 refills | Status: DC

## 2016-07-13 MED ORDER — RANITIDINE HCL 300 MG PO TABS: 300.0000 mg | ORAL_TABLET | Freq: Every day | ORAL | 1 refills | Status: DC

## 2016-07-13 MED ORDER — METFORMIN HCL 500 MG PO TABS: ORAL_TABLET | ORAL | 1 refills | Status: DC

## 2016-07-13 MED ORDER — LOSARTAN POTASSIUM 100 MG PO TABS: 100.0000 mg | ORAL_TABLET | Freq: Every day | ORAL | 1 refills | Status: DC

## 2016-07-13 NOTE — Patient Instructions (Signed)
Follow up in 3-4 months to recheck diabetes We'll notify you of your lab results and make any changes if needed Continue to work on healthy diet and regular exercise- you can do it! Schedule your eye exam for the end of the month Call with any questions or concerns Have a great vacation!!!

## 2016-07-13 NOTE — Progress Notes (Signed)
Pre visit review using our clinic review tool, if applicable. No additional management support is needed unless otherwise documented below in the visit note. 

## 2016-07-13 NOTE — Assessment & Plan Note (Signed)
Chronic problem.  Tolerating statin.  Check labs.  Adjust meds prn  

## 2016-07-13 NOTE — Assessment & Plan Note (Addendum)
Chronic problem.  UTD on eye exam (due end of month).  On ARB for renal protection.  UTD on foot exam.  Stressed need for healthy diet and regular exercise.  Check labs.  Adjust meds prn

## 2016-07-13 NOTE — Assessment & Plan Note (Signed)
Chronic problem.  Adequate control.  Asymptomatic.  Check labs.  No anticipated med changes.  Will follow. 

## 2016-07-13 NOTE — Progress Notes (Signed)
   Subjective:    Patient ID: [name], male    DOB: 29-Jan-1945, 71 y.o.   MRN: [id number]  HPI HTN- chronic problem, on Losartan 100mg  daily w/ adequate control.  Denies CP, SOB, HAs, visual changes, edema.    Hyperlipidemia- chronic problem, on Simvastatin 40mg  daily.  Denies abd pain, N/V, myalgias.  DM- chronic problem, on Amaryl 2mg  daily and Metformin 500mg  BID.  UTD on foot exam, eye exam (due later this month).  On ARB for renal protection.  Pt is down 4 lbs since last visit.  Denies symptomatic lows.  No numbness/tingling in hands, L leg numbness due to back issue.   Review of Systems For ROS see HPI     Objective:   Physical Exam  Constitutional: He is oriented to person, place, and time. He appears well-developed and well-nourished. No distress.  obese  HENT:  Head: Normocephalic and atraumatic.  Eyes: Conjunctivae and EOM are normal. Pupils are equal, round, and reactive to light.  Neck: Normal range of motion. Neck supple. No thyromegaly present.  Cardiovascular: Normal rate, regular rhythm, normal heart sounds and intact distal pulses.   No murmur heard. Pulmonary/Chest: Effort normal and breath sounds normal. No respiratory distress.  Abdominal: Soft. Bowel sounds are normal. He exhibits no distension.  Musculoskeletal: He exhibits no edema.  Lymphadenopathy:    He has no cervical adenopathy.  Neurological: He is alert and oriented to person, place, and time. No cranial nerve deficit.  Skin: Skin is warm and dry.  Psychiatric: He has a normal mood and affect. His behavior is normal.  Vitals reviewed.         Assessment & Plan:

## 2016-07-14 ENCOUNTER — Encounter: Payer: Self-pay | Admitting: General Practice

## 2016-07-15 ENCOUNTER — Telehealth: Payer: Self-pay

## 2016-07-15 NOTE — Telephone Encounter (Signed)
-----   Message from Vaughan Browner sent at 07/14/2016  8:36 AM EDT ----- Regarding: [name] and his wife Orbie Hurst are scheduled to see Dr. Birdie Riddle on 01/26/16 and also need to see you for their AWV.  They can only come for appts on Mondays or Tuesdays.  Can you make arrangements to be at [facility] that morning?  Thanks, Derinda Late

## 2016-07-15 NOTE — Telephone Encounter (Signed)
LM informing patients of appointments for AWV on 01/25/2017.

## 2016-08-03 ENCOUNTER — Other Ambulatory Visit: Payer: Self-pay | Admitting: Family Medicine

## 2016-08-09 DIAGNOSIS — Z9889 Other specified postprocedural states: Secondary | ICD-10-CM | POA: Diagnosis not present

## 2016-08-09 DIAGNOSIS — Z4789 Encounter for other orthopedic aftercare: Secondary | ICD-10-CM | POA: Diagnosis not present

## 2016-08-09 DIAGNOSIS — M5416 Radiculopathy, lumbar region: Secondary | ICD-10-CM | POA: Diagnosis not present

## 2016-08-20 DIAGNOSIS — E119 Type 2 diabetes mellitus without complications: Secondary | ICD-10-CM | POA: Diagnosis not present

## 2016-08-20 DIAGNOSIS — H2513 Age-related nuclear cataract, bilateral: Secondary | ICD-10-CM | POA: Diagnosis not present

## 2016-09-07 DIAGNOSIS — Z4789 Encounter for other orthopedic aftercare: Secondary | ICD-10-CM | POA: Diagnosis not present

## 2016-09-07 DIAGNOSIS — M7062 Trochanteric bursitis, left hip: Secondary | ICD-10-CM | POA: Diagnosis not present

## 2016-09-17 DIAGNOSIS — M7062 Trochanteric bursitis, left hip: Secondary | ICD-10-CM | POA: Diagnosis not present

## 2016-09-29 ENCOUNTER — Ambulatory Visit (INDEPENDENT_AMBULATORY_CARE_PROVIDER_SITE_OTHER): Payer: Medicare Other

## 2016-09-29 DIAGNOSIS — Z23 Encounter for immunization: Secondary | ICD-10-CM | POA: Diagnosis not present

## 2016-09-30 ENCOUNTER — Other Ambulatory Visit: Payer: Self-pay | Admitting: Family Medicine

## 2016-10-18 ENCOUNTER — Encounter: Payer: Self-pay | Admitting: Family Medicine

## 2016-10-18 ENCOUNTER — Encounter: Payer: Self-pay | Admitting: General Practice

## 2016-10-18 ENCOUNTER — Ambulatory Visit (INDEPENDENT_AMBULATORY_CARE_PROVIDER_SITE_OTHER): Payer: Medicare Other | Admitting: Family Medicine

## 2016-10-18 VITALS — BP 122/86 | HR 96 | Resp 16 | Ht 70.0 in | Wt 244.1 lb

## 2016-10-18 DIAGNOSIS — E119 Type 2 diabetes mellitus without complications: Secondary | ICD-10-CM | POA: Diagnosis not present

## 2016-10-18 LAB — BASIC METABOLIC PANEL
BUN: 23 mg/dL (ref 6–23)
CO2: 23 mEq/L (ref 19–32)
Calcium: 9.6 mg/dL (ref 8.4–10.5)
Chloride: 106 mEq/L (ref 96–112)
Creatinine, Ser: 0.96 mg/dL (ref 0.40–1.50)
GFR: 81.87 mL/min (ref >60.00)
Glucose, Bld: 143 mg/dL — ABNORMAL HIGH (ref 70–99)
POTASSIUM: 4.3 meq/L (ref 3.5–5.1)
SODIUM: 140 meq/L (ref 135–145)

## 2016-10-18 LAB — HEMOGLOBIN A1C: Hgb A1c MFr Bld: 6.7 % — ABNORMAL HIGH (ref 4.6–6.5)

## 2016-10-18 NOTE — Progress Notes (Signed)
   Subjective:    Patient ID: [name], male    DOB: [birth year], 71 y.o.   MRN: [id number]  HPI DM- chronic problem, on Amaryl and Metformin w/ hx of good control.  On Losartan for renal protection.  UTD on foot exam, eye exam.  Still unable to exercise due to chronic back issues.  No CP, SOB, HAs, visual changes, abd pain, N/V, numbness/tingling of hands feet (other than L leg which is numb from back issues).  Denies symptomatic lows.   Review of Systems For ROS see HPI     Objective:   Physical Exam  Constitutional: He is oriented to person, place, and time. He appears well-developed and well-nourished. No distress.  HENT:  Head: Normocephalic and atraumatic.  Eyes: Pupils are equal, round, and reactive to light. Conjunctivae and EOM are normal.  Neck: Normal range of motion. Neck supple. No thyromegaly present.  Cardiovascular: Normal rate, regular rhythm, normal heart sounds and intact distal pulses.   No murmur heard. Pulmonary/Chest: Effort normal and breath sounds normal. No respiratory distress.  Abdominal: Soft. Bowel sounds are normal. He exhibits no distension.  Musculoskeletal: He exhibits no edema.  Lymphadenopathy:    He has no cervical adenopathy.  Neurological: He is alert and oriented to person, place, and time. No cranial nerve deficit.  Skin: Skin is warm and dry.  Psychiatric: He has a normal mood and affect. His behavior is normal.  Vitals reviewed.         Assessment & Plan:

## 2016-10-18 NOTE — Patient Instructions (Signed)
Schedule your Medicare Wellness Visit with [name] for January and a diabetes, BP, and cholesterol follow up with me for the same time We'll notify you of your lab results and make any changes if needed Continue to work on healthy diet and regular exercise as you are able Call with any questions or concerns Happy Fall!!!

## 2016-10-18 NOTE — Assessment & Plan Note (Signed)
Chronic problem.  Pt is tolerating Metformin and Amaryl w/o difficulty.  UTD on foot exam, eye exam, and on ARB for renal protection.  Stressed need for healthy diet and regular exercise.  Check labs.  Adjust meds prn

## 2016-10-31 ENCOUNTER — Other Ambulatory Visit: Payer: Self-pay | Admitting: Family Medicine

## 2016-11-02 ENCOUNTER — Ambulatory Visit (HOSPITAL_COMMUNITY)
Admission: RE | Admit: 2016-11-02 | Discharge: 2016-11-02 | Disposition: A | Discharge status: Home / Self Care | Payer: Medicare Other | Source: Ambulatory Visit | Attending: Specialist | Admitting: Specialist

## 2016-11-02 ENCOUNTER — Other Ambulatory Visit (HOSPITAL_COMMUNITY): Payer: Self-pay | Admitting: Specialist

## 2016-11-02 DIAGNOSIS — M79605 Pain in left leg: Secondary | ICD-10-CM | POA: Diagnosis not present

## 2016-11-02 DIAGNOSIS — M7989 Other specified soft tissue disorders: Secondary | ICD-10-CM | POA: Diagnosis not present

## 2016-11-02 DIAGNOSIS — M7062 Trochanteric bursitis, left hip: Secondary | ICD-10-CM | POA: Diagnosis not present

## 2016-11-02 NOTE — Progress Notes (Signed)
Left lower extremity venous duplex has been completed. Preliminary results can be found in chart review -> CV Proc Results were given to [facility] at Dr. Bernadette Hoit office.  11/02/16 11:23 AM [name] RVT

## 2016-11-10 DIAGNOSIS — R972 Elevated prostate specific antigen [PSA]: Secondary | ICD-10-CM | POA: Diagnosis not present

## 2016-11-10 DIAGNOSIS — N401 Enlarged prostate with lower urinary tract symptoms: Secondary | ICD-10-CM | POA: Diagnosis not present

## 2016-11-10 DIAGNOSIS — R351 Nocturia: Secondary | ICD-10-CM | POA: Diagnosis not present

## 2016-11-10 DIAGNOSIS — N486 Induration penis plastica: Secondary | ICD-10-CM | POA: Diagnosis not present

## 2016-11-10 DIAGNOSIS — N312 Flaccid neuropathic bladder, not elsewhere classified: Secondary | ICD-10-CM | POA: Diagnosis not present

## 2016-11-10 DIAGNOSIS — N5201 Erectile dysfunction due to arterial insufficiency: Secondary | ICD-10-CM | POA: Diagnosis not present

## 2016-11-11 DIAGNOSIS — M5416 Radiculopathy, lumbar region: Secondary | ICD-10-CM | POA: Diagnosis not present

## 2016-11-11 DIAGNOSIS — M5136 Other intervertebral disc degeneration, lumbar region: Secondary | ICD-10-CM | POA: Diagnosis not present

## 2016-11-22 DIAGNOSIS — Z4789 Encounter for other orthopedic aftercare: Secondary | ICD-10-CM | POA: Diagnosis not present

## 2016-11-22 DIAGNOSIS — M7062 Trochanteric bursitis, left hip: Secondary | ICD-10-CM | POA: Diagnosis not present

## 2016-11-29 DIAGNOSIS — M5136 Other intervertebral disc degeneration, lumbar region: Secondary | ICD-10-CM | POA: Diagnosis not present

## 2016-12-08 DIAGNOSIS — G629 Polyneuropathy, unspecified: Secondary | ICD-10-CM | POA: Diagnosis not present

## 2016-12-25 ENCOUNTER — Other Ambulatory Visit: Payer: Self-pay | Admitting: Family Medicine

## 2016-12-30 DIAGNOSIS — M5136 Other intervertebral disc degeneration, lumbar region: Secondary | ICD-10-CM | POA: Diagnosis not present

## 2016-12-30 DIAGNOSIS — M5416 Radiculopathy, lumbar region: Secondary | ICD-10-CM | POA: Diagnosis not present

## 2017-01-17 DIAGNOSIS — M545 Low back pain: Secondary | ICD-10-CM | POA: Diagnosis not present

## 2017-01-17 DIAGNOSIS — M5416 Radiculopathy, lumbar region: Secondary | ICD-10-CM | POA: Diagnosis not present

## 2017-01-17 DIAGNOSIS — M48061 Spinal stenosis, lumbar region without neurogenic claudication: Secondary | ICD-10-CM | POA: Diagnosis not present

## 2017-01-24 NOTE — Progress Notes (Addendum)
Subjective:   [name] is a 71 y.o. male who presents for Medicare Annual/Subsequent preventive examination.  Review of Systems:  No ROS.  Medicare Wellness Visit. Additional risk factors are reflected in the social history.  Cardiac Risk Factors include: advanced age (>98men, >61 women);obesity (BMI >30kg/m2);diabetes mellitus;dyslipidemia;male gender;hypertension;family history of premature cardiovascular disease   Sleep patterns: Sleeps 6 hours. Up to void x 3.  Home Safety/Smoke Alarms: Feels safe in home. Smoke alarms in place.  Living environment; residence and Firearm Safety: Lives with wife in single story home.  Seat Belt Safety/Bike Helmet: Wears seat belt.   Male:   CCS-Colonoscopy 07/23/2008, polyp. Recall 10 years.       PSA-Followed by [facility].   Lab Results  Component Value Date   PSA 7.93 (H) 09/07/2012   PSA 6.04 (H) 11/23/2011   PSA 4.62 (H) 03/25/2009       Objective:    Vitals: BP 138/80 (BP Location: Right Arm, Patient Position: Sitting, Cuff Size: Normal)   Pulse 89   Temp 98.4 F (36.9 C) (Temporal)   Resp 18   Ht 5\' 10"  (1.778 m)   Wt 249 lb (112.9 kg)   SpO2 98%   BMI 35.73 kg/m   Body mass index is 35.73 kg/m.  Advanced Directives 01/25/2017 05/19/2016 05/12/2016 03/07/2012 03/07/2012 03/03/2012  Does Patient Have a Medical Advance Directive? Yes Yes Yes Patient has advance directive, copy not in chart - Patient has advance directive, copy not in chart  Type of Advance Directive [place];Living will [place];Living will [place];Living will - - [place];Living will  Does patient want to make changes to medical advance directive? - No - Patient declined No - Patient declined - - -  Copy of [place] in Chart? No - copy requested No - copy requested - Copy requested from other (Comment) - Copy requested from family  Pre-existing out of  facility DNR order (yellow form or pink MOST form) - - - - No -    Tobacco Social History   Tobacco Use  Smoking Status Never Smoker  Smokeless Tobacco Never Used     Counseling given: Not Answered   Past Medical History:  Diagnosis Date  . Anxiety   . BPH (benign prostatic hypertrophy) with urinary obstruction   . Cholelithiasis   . Clotting disorder ([place])   . Complication of anesthesia    urinary retention ; "bladder didnt wake up"; says last gallbladder surgery he shouldve been cathed with his med hx  . Diabetes mellitus   . Diverticulosis of colon   . DJD (degenerative joint disease)   . GERD (gastroesophageal reflux disease)   . Hemorrhoids   . History of colonic polyps   . History of DVT (deep vein thrombosis)   . History of kidney stones   . Hyperlipidemia   . Hypertension, benign   . Low back pain syndrome   . Nephrolithiasis   . pulmonary nodule, left lower lobe   . Varicose veins of lower extremities   . Venous insufficiency    Past Surgical History:  Procedure Laterality Date  . APPENDECTOMY    . bilateral inguinal hernia repairs    . CERVICAL DISC ARTHROPLASTY    . CHOLECYSTECTOMY  03/07/2012  . CHOLECYSTECTOMY N/A 03/07/2012   Procedure: LAPAROSCOPIC CHOLECYSTECTOMY;  Surgeon: Rolm Bookbinder, MD;  Location: [facility];  Service: General;  Laterality: N/A;  . HERNIA REPAIR  ventral hernia with mesh 1990s  . JOINT REPLACEMENT    . left ankle surgery with plate and screws  [id number]   by Dr. Sharol Given  . LUMBAR LAMINECTOMY/DECOMPRESSION MICRODISCECTOMY N/A 05/19/2016   Procedure: Microlumbar decompression L3-4, L4-5;  Surgeon: Susa Day, MD;  Location: [facility];  Service: Orthopedics;  Laterality: N/A;  . sebaceous cyst surgery    . t1-t2 disc surgery  2007   by Dr. Louanne Skye  . TONSILLECTOMY    . TOTAL KNEE ARTHROPLASTY     right and left   Family History  Problem Relation Age of Onset  . Gout Father   . Coronary artery disease Father 31       died of  massive MI age 2  . Heart attack Father   . CAD Brother 19   Social History   Socioeconomic History  . Marital status: Married    Spouse name: None  . Number of children: 2  . Years of education: None  . Highest education level: None  Social Needs  . Financial resource strain: None  . Food insecurity - worry: None  . Food insecurity - inability: None  . Transportation needs - medical: None  . Transportation needs - non-medical: None  Occupational History  . None  Tobacco Use  . Smoking status: Never Smoker  . Smokeless tobacco: Never Used  Substance and Sexual Activity  . Alcohol use: No    Comment: social  use  . Drug use: No  . Sexual activity: None  Other Topics Concern  . None  Social History Narrative   Retired.    Outpatient Encounter Medications as of 01/25/2017  Medication Sig  . acetaminophen (TYLENOL) 500 MG tablet Take 1,000 mg by mouth every 6 (six) hours as needed for mild pain.  [name] aspirin EC 81 MG tablet Take 81 mg by mouth daily.  . cetirizine (ZYRTEC) 10 MG tablet Take 10 mg by mouth daily.  . diclofenac (VOLTAREN) 75 MG EC tablet TAKE 1 TABLET BY MOUTH TWICE A DAY WITH A MEAL  . docusate sodium (COLACE) 100 MG capsule Take 1 capsule (100 mg total) by mouth 2 (two) times daily as needed for mild constipation.  . gabapentin (NEURONTIN) 100 MG capsule TAKE 1 CAPSULE BY MOUTH THREE TIMES A DAY AS NEEDED  . glimepiride (AMARYL) 2 MG tablet TAKE 1 TABLET BY MOUTH DAILY BEFORE BREAKFAST (Patient taking differently: Take 2 mg by mouth daily with breakfast. TAKE 1 TABLET BY MOUTH DAILY BEFORE BREAKFAST)  . losartan (COZAAR) 100 MG tablet TAKE 1 TABLET BY MOUTH EVERY DAY  . metFORMIN (GLUCOPHAGE) 500 MG tablet TAKE 1 TABLET BY MOUTH TWICE A DAY WITH A MEAL  . multivitamin (THERAGRAN) per tablet Take 1 tablet by mouth daily.   . pantoprazole (PROTONIX) 40 MG tablet TAKE 1 TABLET (40 MG TOTAL) BY MOUTH 2 (TWO) TIMES DAILY.  . ranitidine (ZANTAC) 300 MG tablet Take  1 tablet (300 mg total) by mouth at bedtime.  [name] RAPAFLO 8 MG CAPS capsule Take 8 mg by mouth daily.  . simvastatin (ZOCOR) 40 MG tablet TAKE 1 TABLET (40 MG TOTAL) BY MOUTH AT BEDTIME.   No facility-administered encounter medications on file as of 01/25/2017.     Activities of Daily Living In your present state of health, do you have any difficulty performing the following activities: 01/25/2017 10/18/2016  Hearing? N N  Vision? N N  Difficulty concentrating or making decisions? N N  Walking or climbing stairs? Aggie Moats  Comment left leg pain/numbness -  Dressing or bathing? N N  Doing errands, shopping? N N  Preparing Food and eating ? N -  Using the Toilet? N -  In the past six months, have you accidently leaked urine? N -  Do you have problems with loss of bowel control? N -  Managing your Medications? N -  Managing your Finances? N -  Housekeeping or managing your Housekeeping? N -  Some recent data might be hidden    Patient Care Team: Midge Minium, MD as PCP - General (Family Medicine) Paralee Cancel, MD as Consulting Physician (Orthopedic Surgery) Irene Shipper, MD as Consulting Physician (Gastroenterology) Sharyne Peach, MD as Consulting Physician (Ophthalmology) [facility], Michae Kava, MD as Attending Physician Susa Day, MD as Consulting Physician (Orthopedic Surgery) Rolm Bookbinder, MD as Consulting Physician (Dermatology)   Assessment:   This is a routine wellness examination for [name].  Exercise Activities and Dietary recommendations Current Exercise Habits: Structured exercise class, Type of exercise: strength training/weights(bike), Time (Minutes): 40, Frequency (Times/Week): 3, Weekly Exercise (Minutes/Week): 120, Exercise limited by: neurologic condition(s);orthopedic condition(s)   Diet (meal preparation, eat out, water intake, caffeinated beverages, dairy products, fruits and vegetables): Drinks water.   Breakfast: cereal Lunch: sandwich,  crackers Dinner:  protein and veggies.     Goals    . Patient Stated     Be able to increase activity by getting pain under control.        Fall Risk Fall Risk  01/25/2017 03/11/2016 01/07/2016 07/30/2015 12/25/2014  Falls in the past year? No No No No No    Depression Screen PHQ 2/9 Scores 01/25/2017 10/18/2016 03/11/2016 01/07/2016  PHQ - 2 Score 0 0 0 0  PHQ- 9 Score - 0 0 0    Cognitive Function MMSE - Mini Mental State Exam 01/25/2017  Orientation to time 5  Orientation to Place 5  Registration 3  Attention/ Calculation 3  Recall 2  Language- name 2 objects 2  Language- repeat 1  Language- follow 3 step command 3  Language- read & follow direction 1  Write a sentence 1  Copy design 1  Total score 27        Immunization History  Administered Date(s) Administered  . H1N1 01/03/2008  . Influenza Split 09/21/2010, 09/14/2011  . Influenza Whole 11/05/2008, 09/15/2009  . Influenza,inj,Quad PF,6+ Mos 10/05/2012, 10/26/2013, 09/27/2014, 09/23/2015, 09/29/2016  . Pneumococcal Conjugate-13 12/18/2013  . Pneumococcal Polysaccharide-23 12/26/2012  . Tdap 11/22/2008    Screening Tests Health Maintenance  Topic Date Due  . Hepatitis C Screening  04/04/2017 (Originally 27-Jan-1945)  . FOOT EXAM  03/11/2017  . HEMOGLOBIN A1C  04/18/2017  . OPHTHALMOLOGY EXAM  06/18/2017  . COLONOSCOPY  07/24/2018  . TETANUS/TDAP  11/23/2018  . INFLUENZA VACCINE  Completed  . PNA vac Low Risk Adult  Completed       Plan:    Bring a copy of your living will and/or healthcare power of attorney to your next office visit.  Continue doing brain stimulating activities (puzzles, reading, adult coloring books, staying active) to keep memory sharp.   I have personally reviewed and noted the following in the patient's chart:   . Medical and social history . Use of alcohol, tobacco or illicit drugs  . Current medications and supplements . Functional ability and status . Nutritional  status . Physical activity . Advanced directives . List of other physicians . Hospitalizations, surgeries, and ER visits in previous 12 months . Vitals .  Screenings to include cognitive, depression, and falls . Referrals and appointments  In addition, I have reviewed and discussed with patient certain preventive protocols, quality metrics, and best practice recommendations. A written personalized care plan for preventive services as well as general preventive health recommendations were provided to patient.     Gerilyn Nestle, RN  01/25/2017   Reviewed documentation provided by RN and agree w/ above.  Annye Asa, MD

## 2017-01-25 ENCOUNTER — Ambulatory Visit (INDEPENDENT_AMBULATORY_CARE_PROVIDER_SITE_OTHER): Payer: Medicare Other

## 2017-01-25 ENCOUNTER — Other Ambulatory Visit: Payer: Self-pay

## 2017-01-25 ENCOUNTER — Ambulatory Visit (INDEPENDENT_AMBULATORY_CARE_PROVIDER_SITE_OTHER): Payer: Medicare Other | Admitting: Family Medicine

## 2017-01-25 ENCOUNTER — Encounter: Payer: Self-pay | Admitting: Family Medicine

## 2017-01-25 VITALS — BP 138/80 | HR 89 | Temp 98.4°F | Resp 18 | Ht 70.0 in | Wt 249.0 lb

## 2017-01-25 DIAGNOSIS — Z0000 Encounter for general adult medical examination without abnormal findings: Secondary | ICD-10-CM | POA: Diagnosis not present

## 2017-01-25 DIAGNOSIS — E119 Type 2 diabetes mellitus without complications: Secondary | ICD-10-CM

## 2017-01-25 DIAGNOSIS — I10 Essential (primary) hypertension: Secondary | ICD-10-CM

## 2017-01-25 DIAGNOSIS — E785 Hyperlipidemia, unspecified: Secondary | ICD-10-CM

## 2017-01-25 LAB — CBC WITH DIFFERENTIAL/PLATELET
Basophils Absolute: 0 10*3/uL (ref 0.0–0.1)
Basophils Relative: 0.2 % (ref 0.0–3.0)
EOS PCT: 3.4 % (ref 0.0–5.0)
Eosinophils Absolute: 0.3 10*3/uL (ref 0.0–0.7)
HCT: 46.6 % (ref 39.0–52.0)
HEMOGLOBIN: 15.6 g/dL (ref 13.0–17.0)
Lymphocytes Relative: 28.1 % (ref 12.0–46.0)
Lymphs Abs: 2.2 10*3/uL (ref 0.7–4.0)
MCHC: 33.5 g/dL (ref 30.0–36.0)
MCV: 91.4 fl (ref 78.0–100.0)
MONO ABS: 0.8 10*3/uL (ref 0.1–1.0)
Monocytes Relative: 10.5 % (ref 3.0–12.0)
Neutro Abs: 4.6 10*3/uL (ref 1.4–7.7)
Neutrophils Relative %: 57.8 % (ref 43.0–77.0)
Platelets: 272 10*3/uL (ref 150.0–400.0)
RBC: 5.1 Mil/uL (ref 4.22–5.81)
RDW: 14 % (ref 11.5–15.5)
WBC: 8 10*3/uL (ref 4.0–10.5)

## 2017-01-25 LAB — LIPID PANEL
CHOL/HDL RATIO: 3
Cholesterol: 105 mg/dL (ref 0–200)
HDL: 34.4 mg/dL — AB (ref >39.00)
LDL CALC: 42 mg/dL (ref 0–99)
NonHDL: 71.04
TRIGLYCERIDES: 147 mg/dL (ref 0.0–149.0)
VLDL: 29.4 mg/dL (ref 0.0–40.0)

## 2017-01-25 LAB — HEPATIC FUNCTION PANEL
ALBUMIN: 4.1 g/dL (ref 3.5–5.2)
ALT: 33 U/L (ref 0–53)
AST: 21 U/L (ref 0–37)
Alkaline Phosphatase: 45 U/L (ref 39–117)
Bilirubin, Direct: 0.1 mg/dL (ref 0.0–0.3)
Total Bilirubin: 0.6 mg/dL (ref 0.2–1.2)
Total Protein: 6.5 g/dL (ref 6.0–8.3)

## 2017-01-25 LAB — BASIC METABOLIC PANEL
BUN: 20 mg/dL (ref 6–23)
CHLORIDE: 106 meq/L (ref 96–112)
CO2: 28 mEq/L (ref 19–32)
CREATININE: 1.05 mg/dL (ref 0.40–1.50)
Calcium: 9.3 mg/dL (ref 8.4–10.5)
GFR: 73.77 mL/min (ref >60.00)
Glucose, Bld: 123 mg/dL — ABNORMAL HIGH (ref 70–99)
Potassium: 4.5 mEq/L (ref 3.5–5.1)
Sodium: 141 mEq/L (ref 135–145)

## 2017-01-25 LAB — HEMOGLOBIN A1C: Hgb A1c MFr Bld: 6.8 % — ABNORMAL HIGH (ref 4.6–6.5)

## 2017-01-25 LAB — TSH: TSH: 1.05 u[IU]/mL (ref 0.35–4.50)

## 2017-01-25 MED ORDER — RANITIDINE HCL 300 MG PO TABS: 300.0000 mg | ORAL_TABLET | Freq: Every day | ORAL | 1 refills | Status: DC

## 2017-01-25 MED ORDER — LOSARTAN POTASSIUM 100 MG PO TABS: 100.0000 mg | ORAL_TABLET | Freq: Every day | ORAL | 1 refills | Status: DC

## 2017-01-25 MED ORDER — PANTOPRAZOLE SODIUM 40 MG PO TBEC: DELAYED_RELEASE_TABLET | ORAL | 1 refills | Status: DC

## 2017-01-25 MED ORDER — SIMVASTATIN 40 MG PO TABS: ORAL_TABLET | ORAL | 1 refills | Status: DC

## 2017-01-25 NOTE — Assessment & Plan Note (Signed)
Chronic problem.  Tolerating Amaryl and Metformin w/o difficulty.  UTD on foot exam, eye exam.  On ARB for renal protection.  Stressed need for healthy diet and regular exercise.  Check labs.  Adjust meds prn

## 2017-01-25 NOTE — Assessment & Plan Note (Signed)
Chronic problem.  Adequate control today.  Asymptomatic.  Check labs.  No anticipated med changes.  Will follow. 

## 2017-01-25 NOTE — Progress Notes (Signed)
   Subjective:    Patient ID: [name], male    DOB: Mar 31, 1945, 72 y.o.   MRN: [id number]  HPI DM- chronic problem, on Amaryl 2mg , Metformin 500mg  BID w/ hx of adequate control.  On ARB for renal protection.  UTD on eye exam, foot exam.  Denies symptomatic low.  No numbness/tingling of hands/feet.  HTN- chronic problem, on Losartan 100mg  daily.  Adequate control today.  Pt reports he's in 'a lot of pain' due to chronic back and leg pain.  No CP, SOB, HAs, visual changes, edema.  Hyperlipidemia- chronic problem, on Simvastatin daily.  No regular exercise.  No abd pain, N/V.  Obesity- pt's BMI >35 and he has multiple comorbidities   Review of Systems For ROS see HPI     Objective:   Physical Exam  Constitutional: He is oriented to person, place, and time. He appears well-developed and well-nourished. No distress.  obese  HENT:  Head: Normocephalic and atraumatic.  Eyes: Conjunctivae and EOM are normal. Pupils are equal, round, and reactive to light.  Neck: Normal range of motion. Neck supple. No thyromegaly present.  Cardiovascular: Normal rate, regular rhythm, normal heart sounds and intact distal pulses.  No murmur heard. Pulmonary/Chest: Effort normal and breath sounds normal. No respiratory distress.  Abdominal: Soft. Bowel sounds are normal. He exhibits no distension.  Musculoskeletal: He exhibits no edema.  Lymphadenopathy:    He has no cervical adenopathy.  Neurological: He is alert and oriented to person, place, and time. No cranial nerve deficit.  Skin: Skin is warm and dry.  Psychiatric: He has a normal mood and affect. His behavior is normal.  Vitals reviewed.         Assessment & Plan:

## 2017-01-25 NOTE — Assessment & Plan Note (Signed)
Chronic problem.  Tolerating statin w/o difficulty.  Check labs.  Adjust meds prn  

## 2017-01-25 NOTE — Assessment & Plan Note (Signed)
Pt's BMI is >35 but with his multiple comorbidities (DM, HTN, hyperlipidemia) he qualifies as morbidly obese.  Stressed need for healthy diet and exercise as able.  Will continue to follow.

## 2017-01-25 NOTE — Patient Instructions (Addendum)
Follow up in 3-4 months to recheck diabetes We'll notify you of your lab results and make any changes if needed Continue to work on healthy diet and exercise as able- you can do it!! Call with any questions or concerns Happy New Year!!

## 2017-01-25 NOTE — Patient Instructions (Addendum)
Bring a copy of your living will and/or healthcare power of attorney to your next office visit.  Continue doing brain stimulating activities (puzzles, reading, adult coloring books, staying active) to keep memory sharp.    Fall Prevention in the Home Falls can cause injuries. They can happen to people of all ages. There are many things you can do to make your home safe and to help prevent falls. What can I do on the outside of my home?  Regularly fix the edges of walkways and driveways and fix any cracks.  Remove anything that might make you trip as you walk through a door, such as a raised step or threshold.  Trim any bushes or trees on the path to your home.  Use bright outdoor lighting.  Clear any walking paths of anything that might make someone trip, such as rocks or tools.  Regularly check to see if handrails are loose or broken. Make sure that both sides of any steps have handrails.  Any raised decks and porches should have guardrails on the edges.  Have any leaves, snow, or ice cleared regularly.  Use sand or salt on walking paths during winter.  Clean up any spills in your garage right away. This includes oil or grease spills. What can I do in the bathroom?  Use night lights.  Install grab bars by the toilet and in the tub and shower. Do not use towel bars as grab bars.  Use non-skid mats or decals in the tub or shower.  If you need to sit down in the shower, use a plastic, non-slip stool.  Keep the floor dry. Clean up any water that spills on the floor as soon as it happens.  Remove soap buildup in the tub or shower regularly.  Attach bath mats securely with double-sided non-slip rug tape.  Do not have throw rugs and other things on the floor that can make you trip. What can I do in the bedroom?  Use night lights.  Make sure that you have a light by your bed that is easy to reach.  Do not use any sheets or blankets that are too big for your bed. They  should not hang down onto the floor.  Have a firm chair that has side arms. You can use this for support while you get dressed.  Do not have throw rugs and other things on the floor that can make you trip. What can I do in the kitchen?  Clean up any spills right away.  Avoid walking on wet floors.  Keep items that you use a lot in easy-to-reach places.  If you need to reach something above you, use a strong step stool that has a grab bar.  Keep electrical cords out of the way.  Do not use floor polish or wax that makes floors slippery. If you must use wax, use non-skid floor wax.  Do not have throw rugs and other things on the floor that can make you trip. What can I do with my stairs?  Do not leave any items on the stairs.  Make sure that there are handrails on both sides of the stairs and use them. Fix handrails that are broken or loose. Make sure that handrails are as long as the stairways.  Check any carpeting to make sure that it is firmly attached to the stairs. Fix any carpet that is loose or worn.  Avoid having throw rugs at the top or bottom of the stairs. If  you do have throw rugs, attach them to the floor with carpet tape.  Make sure that you have a light switch at the top of the stairs and the bottom of the stairs. If you do not have them, ask someone to add them for you. What else can I do to help prevent falls?  Wear shoes that: ? Do not have high heels. ? Have rubber bottoms. ? Are comfortable and fit you well. ? Are closed at the toe. Do not wear sandals.  If you use a stepladder: ? Make sure that it is fully opened. Do not climb a closed stepladder. ? Make sure that both sides of the stepladder are locked into place. ? Ask someone to hold it for you, if possible.  Clearly mark and make sure that you can see: ? Any grab bars or handrails. ? First and last steps. ? Where the edge of each step is.  Use tools that help you move around (mobility aids) if  they are needed. These include: ? Canes. ? Walkers. ? Scooters. ? Crutches.  Turn on the lights when you go into a dark area. Replace any light bulbs as soon as they burn out.  Set up your furniture so you have a clear path. Avoid moving your furniture around.  If any of your floors are uneven, fix them.  If there are any pets around you, be aware of where they are.  Review your medicines with your doctor. Some medicines can make you feel dizzy. This can increase your chance of falling. Ask your doctor what other things that you can do to help prevent falls. This information is not intended to replace advice given to you by your health care provider. Make sure you discuss any questions you have with your health care provider. Document Released: 10/17/2008 Document Revised: 05/29/2015 Document Reviewed: 01/25/2014 Elsevier Interactive Patient Education  2018 [place] Maintenance, Male A healthy lifestyle and preventive care is important for your health and wellness. Ask your health care provider about what schedule of regular examinations is right for you. What should I know about weight and diet? Eat a Healthy Diet  Eat plenty of vegetables, fruits, whole grains, low-fat dairy products, and lean protein.  Do not eat a lot of foods high in solid fats, added sugars, or salt.  Maintain a Healthy Weight Regular exercise can help you achieve or maintain a healthy weight. You should:  Do at least 150 minutes of exercise each week. The exercise should increase your heart rate and make you sweat (moderate-intensity exercise).  Do strength-training exercises at least twice a week.  Watch Your Levels of Cholesterol and Blood Lipids  Have your blood tested for lipids and cholesterol every 5 years starting at 73 years of age. If you are at high risk for heart disease, you should start having your blood tested when you are 73 years old. You may need to have your cholesterol  levels checked more often if: ? Your lipid or cholesterol levels are high. ? You are older than 73 years of age. ? You are at high risk for heart disease.  What should I know about cancer screening? Many types of cancers can be detected early and may often be prevented. Lung Cancer  You should be screened every year for lung cancer if: ? You are a current smoker who has smoked for at least 30 years. ? You are a former smoker who has quit within the past  15 years.  Talk to your health care provider about your screening options, when you should start screening, and how often you should be screened.  Colorectal Cancer  Routine colorectal cancer screening usually begins at 73 years of age and should be repeated every 5-10 years until you are 73 years old. You may need to be screened more often if early forms of precancerous polyps or small growths are found. Your health care provider may recommend screening at an earlier age if you have risk factors for colon cancer.  Your health care provider may recommend using home test kits to check for hidden blood in the stool.  A small camera at the end of a tube can be used to examine your colon (sigmoidoscopy or colonoscopy). This checks for the earliest forms of colorectal cancer.  Prostate and Testicular Cancer  Depending on your age and overall health, your health care provider may do certain tests to screen for prostate and testicular cancer.  Talk to your health care provider about any symptoms or concerns you have about testicular or prostate cancer.  Skin Cancer  Check your skin from head to toe regularly.  Tell your health care provider about any new moles or changes in moles, especially if: ? There is a change in a mole's size, shape, or color. ? You have a mole that is larger than a pencil eraser.  Always use sunscreen. Apply sunscreen liberally and repeat throughout the day.  Protect yourself by wearing long sleeves, pants, a  wide-brimmed hat, and sunglasses when outside.  What should I know about heart disease, diabetes, and high blood pressure?  If you are 16-16 years of age, have your blood pressure checked every 3-5 years. If you are 77 years of age or older, have your blood pressure checked every year. You should have your blood pressure measured twice-once when you are at a hospital or clinic, and once when you are not at a hospital or clinic. Record the average of the two measurements. To check your blood pressure when you are not at a hospital or clinic, you can use: ? An automated blood pressure machine at a pharmacy. ? A home blood pressure monitor.  Talk to your health care provider about your target blood pressure.  If you are between 59-13 years old, ask your health care provider if you should take aspirin to prevent heart disease.  Have regular diabetes screenings by checking your fasting blood sugar level. ? If you are at a normal weight and have a low risk for diabetes, have this test once every three years after the age of 28. ? If you are overweight and have a high risk for diabetes, consider being tested at a younger age or more often.  A one-time screening for abdominal aortic aneurysm (AAA) by ultrasound is recommended for men aged 49-75 years who are current or former smokers. What should I know about preventing infection? Hepatitis B If you have a higher risk for hepatitis B, you should be screened for this virus. Talk with your health care provider to find out if you are at risk for hepatitis B infection. Hepatitis C Blood testing is recommended for:  Everyone born from [birth year] through 1966.  Anyone with known risk factors for hepatitis C.  Sexually Transmitted Diseases (STDs)  You should be screened each year for STDs including gonorrhea and chlamydia if: ? You are sexually active and are younger than 73 years of age. ? You are older than 73  years of age and your health care provider  tells you that you are at risk for this type of infection. ? Your sexual activity has changed since you were last screened and you are at an increased risk for chlamydia or gonorrhea. Ask your health care provider if you are at risk.  Talk with your health care provider about whether you are at high risk of being infected with HIV. Your health care provider may recommend a prescription medicine to help prevent HIV infection.  What else can I do?  Schedule regular health, dental, and eye exams.  Stay current with your vaccines (immunizations).  Do not use any tobacco products, such as cigarettes, chewing tobacco, and e-cigarettes. If you need help quitting, ask your health care provider.  Limit alcohol intake to no more than 2 drinks per day. One drink equals 12 ounces of beer, 5 ounces of wine, or 1 ounces of hard liquor.  Do not use street drugs.  Do not share needles.  Ask your health care provider for help if you need support or information about quitting drugs.  Tell your health care provider if you often feel depressed.  Tell your health care provider if you have ever been abused or do not feel safe at home. This information is not intended to replace advice given to you by your health care provider. Make sure you discuss any questions you have with your health care provider. Document Released: 06/19/2007 Document Revised: 08/20/2015 Document Reviewed: 09/24/2014 Elsevier Interactive Patient Education  [employer].

## 2017-01-26 ENCOUNTER — Encounter: Payer: Self-pay | Admitting: General Practice

## 2017-01-27 DIAGNOSIS — M961 Postlaminectomy syndrome, not elsewhere classified: Secondary | ICD-10-CM | POA: Diagnosis not present

## 2017-01-27 DIAGNOSIS — M5416 Radiculopathy, lumbar region: Secondary | ICD-10-CM | POA: Diagnosis not present

## 2017-02-07 DIAGNOSIS — M5416 Radiculopathy, lumbar region: Secondary | ICD-10-CM | POA: Diagnosis not present

## 2017-02-07 DIAGNOSIS — M961 Postlaminectomy syndrome, not elsewhere classified: Secondary | ICD-10-CM | POA: Diagnosis not present

## 2017-02-07 DIAGNOSIS — M7062 Trochanteric bursitis, left hip: Secondary | ICD-10-CM | POA: Diagnosis not present

## 2017-03-01 DIAGNOSIS — M5416 Radiculopathy, lumbar region: Secondary | ICD-10-CM | POA: Diagnosis not present

## 2017-03-04 DIAGNOSIS — R972 Elevated prostate specific antigen [PSA]: Secondary | ICD-10-CM | POA: Diagnosis not present

## 2017-03-15 DIAGNOSIS — M519 Unspecified thoracic, thoracolumbar and lumbosacral intervertebral disc disorder: Secondary | ICD-10-CM | POA: Diagnosis not present

## 2017-03-15 DIAGNOSIS — M961 Postlaminectomy syndrome, not elsewhere classified: Secondary | ICD-10-CM | POA: Diagnosis not present

## 2017-03-15 DIAGNOSIS — M5416 Radiculopathy, lumbar region: Secondary | ICD-10-CM | POA: Diagnosis not present

## 2017-04-05 ENCOUNTER — Telehealth: Payer: Self-pay | Admitting: General Practice

## 2017-04-05 NOTE — Telephone Encounter (Signed)
Called and advised patient that he would need a nurse visit for an EKG. Patient was not happy. His words " I just had a physical, why didn't you do it then? It is bad enough that I have had to wait so long for you to sign off on the paperwork. I could have already had the surgery and been recovering " pt was advised that insurance does not always cover EKG's with physicals and that we had just received the paperwork. Pt wife then got on the phone and was arguing that patient just had an EKG last year for another surgery "we should not have to repeat this". I advised that the last EKG was completed 04/21/16 and unfortunately Dr. Virgil Benedict recommendation was that another EKG had to be completed because it was almost a year. After being fussed at for another 5 minutes they finally decided to go ahead and schedule the nurse visit because "it is the only way I am ever going to be able to be operated on"  Copied from [place] #[id number]. Topic: General - Other >> Apr 05, 2017 11:25 AM Carolyn Stare wrote:   [name] is waiting on the clearance form and they can not schedule his surgery until they received the form back. Pt is asking for form to be sent today

## 2017-04-06 ENCOUNTER — Ambulatory Visit (INDEPENDENT_AMBULATORY_CARE_PROVIDER_SITE_OTHER): Payer: Medicare Other | Admitting: Family Medicine

## 2017-04-06 DIAGNOSIS — Z01818 Encounter for other preprocedural examination: Secondary | ICD-10-CM | POA: Diagnosis not present

## 2017-04-06 NOTE — Progress Notes (Signed)
Patient came in today for an EKG for surgical clearance.  EKG order was mine.  I reviewed EKG done today and it is unchanged from previous.  Form for surgical clearance completed.  Annye Asa, MD

## 2017-04-06 NOTE — Telephone Encounter (Signed)
I am sorry they are upset/frustrated, but the presence of diabetes places him at much higher risk for cardiovascular disease.  Prior to going under anesthesia, it is a good idea to evaluate his EKG and make sure he is not in danger- a lot can change in 1 year.

## 2017-04-10 ENCOUNTER — Other Ambulatory Visit: Payer: Self-pay | Admitting: Family Medicine

## 2017-04-13 ENCOUNTER — Ambulatory Visit: Payer: Self-pay | Admitting: Orthopedic Surgery

## 2017-05-03 ENCOUNTER — Ambulatory Visit: Payer: Self-pay | Admitting: Orthopedic Surgery

## 2017-05-03 NOTE — H&P (Signed)
[name] is an 72 y.o. male.   Chief Complaint: back and left leg pain HPI: Reported by patient. Reason for Visit: (normal) visit for: (recheck back); The patient is 10 months out from decompression.  Severity: pain level 4/10  Associated Symptoms: right lower leg pain.  Medications: The patient is currently taking tizanidine and diclofenac. Notes: .The patient is two weeks out from [facility] left L5-S1. The patient reports the injection helped. Epidural at much better  Past Medical History:  Diagnosis Date  . Anxiety   . BPH (benign prostatic hypertrophy) with urinary obstruction   . Cholelithiasis   . Clotting disorder ([place])   . Complication of anesthesia    urinary retention ; "bladder didnt wake up"; says last gallbladder surgery he shouldve been cathed with his med hx  . Diabetes mellitus   . Diverticulosis of colon   . DJD (degenerative joint disease)   . GERD (gastroesophageal reflux disease)   . Hemorrhoids   . History of colonic polyps   . History of DVT (deep vein thrombosis)   . History of kidney stones   . Hyperlipidemia   . Hypertension, benign   . Low back pain syndrome   . Nephrolithiasis   . pulmonary nodule, left lower lobe   . Varicose veins of lower extremities   . Venous insufficiency     Past Surgical History:  Procedure Laterality Date  . APPENDECTOMY    . bilateral inguinal hernia repairs    . CERVICAL DISC ARTHROPLASTY    . CHOLECYSTECTOMY  03/07/2012  . CHOLECYSTECTOMY N/A 03/07/2012   Procedure: LAPAROSCOPIC CHOLECYSTECTOMY;  Surgeon: Rolm Bookbinder, MD;  Location: [facility];  Service: General;  Laterality: N/A;  . HERNIA REPAIR     ventral hernia with mesh 1990s  . JOINT REPLACEMENT    . left ankle surgery with plate and screws  [id number]   by Dr. Sharol Given  . LUMBAR LAMINECTOMY/DECOMPRESSION MICRODISCECTOMY N/A 05/19/2016   Procedure: Microlumbar decompression L3-4, L4-5;  Surgeon: Susa Day, MD;  Location: [facility];  Service: Orthopedics;   Laterality: N/A;  . sebaceous cyst surgery    . t1-t2 disc surgery  2007   by Dr. Louanne Skye  . TONSILLECTOMY    . TOTAL KNEE ARTHROPLASTY     right and left    Family History  Problem Relation Age of Onset  . Gout Father   . Coronary artery disease Father 47       died of massive MI age 26  . Heart attack Father   . CAD Brother 91   Social History:  reports that he has never smoked. He has never used smokeless tobacco. He reports that he does not drink alcohol or use drugs. Smoking Status: Never smoker Non-smoker Alcohol intake: None Hand Dominance: Left Work related injury?: N Advance directive: Y Freight forwarder of Attorney: Y  Allergies:  Allergies  Allergen Reactions  . Ciprofloxacin     REACTION: rash  . Niacin     REACTION: hypotension, shaky   Medications alfuzosin ER 10 mg tablet,extended release 24 hr TAKE 1 TABLET BY MOUTH EVERY DAY  diclofenac sodium 75 mg tablet,delayed release  glimepiride 2 mg tablet  losartan 100 mg tablet  metFORMIN 500 mg tablet  pantoprazole 40 mg tablet,delayed release  raNITIdine 300 mg tablet  simvastatin 40 mg tablet  Stool Softener  tiZANidine  tiZANidine 2 mg tablet   Family Hx Father - Heart disease Mother - Diabetes mellitus  Review of Systems  Constitutional: Negative.  HENT: Negative.   Eyes: Negative.   Respiratory: Negative.   Cardiovascular: Negative.   Gastrointestinal: Negative.   Genitourinary: Negative.   Musculoskeletal: Positive for back pain and joint pain.  Skin: Negative.   Neurological: Positive for sensory change and focal weakness.  Psychiatric/Behavioral: Negative.     There were no vitals taken for this visit. Physical Exam  Constitutional: He is oriented to person, place, and time. He appears well-developed.  HENT:  Head: Normocephalic.  Eyes: Pupils are equal, round, and reactive to light.  Neck: Normal range of motion.  Cardiovascular: Normal rate.  Respiratory: Effort  normal.  GI: Soft.  Musculoskeletal:  Patient is a 72 year old male.  Gait and Station: Appearance: ambulating with no assistive devices and antalgic gait.  Constitutional: General Appearance: healthy-appearing and distress (mild).  Psychiatric: Mood and Affect: active and alert.  Cardiovascular System: Edema Right: none; Dorsalis and posterior tibial pulses 2+. Edema Left: none.  Abdomen: Inspection and Palpation: non-distended and no tenderness.  Skin: Inspection and palpation: no rash.  Lumbar Spine: Inspection: normal alignment. Bony Palpation of the Lumbar Spine: tender at lumbosacral junction.. Bony Palpation of the Right Hip: no tenderness of the greater trochanter and tenderness of the SI joint; Pelvis stable. Bony Palpation of the Left Hip: no tenderness of the greater trochanter and tenderness of the SI joint. Soft Tissue Palpation on the Right: No flank pain with percussion. Active Range of Motion: limited flexion and extention.  Motor Strength: L1 Motor Strength on the Right: hip flexion iliopsoas 5/5. L1 Motor Strength on the Left: hip flexion iliopsoas 5/5. L2-L4 Motor Strength on the Right: knee extension quadriceps 5/5. L2-L4 Motor Strength on the Left: knee extension quadriceps 5/5. L5 Motor Strength on the Right: ankle dorsiflexion tibialis anterior 5/5 and great toe extension extensor hallucis longus 5/5. L5 Motor Strength on the Left: ankle dorsiflexion tibialis anterior 5/5 and great toe extension extensor hallucis longus 5/5. S1 Motor Strength on the Right: plantar flexion gastrocnemius 5/5. S1 Motor Strength on the Left: plantar flexion gastrocnemius 5/5.  Neurological System: Knee Reflex Right: normal (2). Knee Reflex Left: normal (2). Ankle Reflex Right: normal (2). Ankle Reflex Left: normal (2). Babinski Reflex Right: plantar reflex absent. Babinski Reflex Left: plantar reflex absent. Sensation on the Right: normal distal extremities. Sensation on the Left: normal  distal extremities. Special Tests on the Right: no clonus of the ankle/knee. Special Tests on the Left: no clonus of the ankle/knee and seated straight leg raising test positive.  Upper extremities my motor is 5/5. He is normoreflexic. No Hoffmann sign. No escape sign. Sensory exam is intact. No Babinski or clonus. Abdomen soft. Nontender thoracic  Neurological: He is alert and oriented to person, place, and time.  Skin: Skin is warm and dry.     Assessment/Plan Spinal stenosis  Review Dr. Nelva Bush is no technically successful.  MRI demonstrates facet arthropathy and lateral recess stenosis at L5-S1.  MRI demonstrates previous lumbar decompression at 34,45. Patient demonstrates a new onset of S1 radiculopathy secondary lateral recess stenosis and facet arthropathy at L5-S1.  Best help from an epidural L5-S1 left.  We discussed activity modification.  I had an extensive discussion with the patient concerning their pathology relevant anatomy and treatment options. I discussed the concepts of disc pressure management as well as core motion. I provided the patient with an illustrated handout describing ADL modification. In addition we discussed core strengthening exercises and strategies to avoid reinjury. Appropriate follow-up was discussed as well including the indications  for further imaging such as an MRI. Risk and benefits of over-the-counter anti-inflammatories and/or Tylenol were discussed including their appropriate monitoring. Further options including form of supervised physical therapy as well as possible injection therapy and indications for surgical intervention were discussed as well.  He would like to have a refill on his muscle relaxer  They will call if the pain returns we discussed microlumbar decompression L5-S1 the left. I will discuss this with him over the phone.  If therapeutic for 3-4 months we discussed repeat injection at L5-S1 on the left.  He has multilevel disc  degeneration and any surgery to address the entire pathology would require a multilevel fusion. He is not interested in that at this point.  Plan microlumbar decompression L5-S1, possible L4-5 left  Myers, Spencer Rolls., PA-C for Dr. Tonita Cong 05/03/2017, 11:40 AM

## 2017-05-03 NOTE — H&P (View-Only) (Signed)
[name] is an 72 y.o. male.   Chief Complaint: back and left leg pain HPI: Reported by patient. Reason for Visit: (normal) visit for: (recheck back); The patient is 10 months out from decompression.  Severity: pain level 4/10  Associated Symptoms: right lower leg pain.  Medications: The patient is currently taking tizanidine and diclofenac. Notes: .The patient is two weeks out from [facility] left L5-S1. The patient reports the injection helped. Epidural at much better  Past Medical History:  Diagnosis Date  . Anxiety   . BPH (benign prostatic hypertrophy) with urinary obstruction   . Cholelithiasis   . Clotting disorder ([place])   . Complication of anesthesia    urinary retention ; "bladder didnt wake up"; says last gallbladder surgery he shouldve been cathed with his med hx  . Diabetes mellitus   . Diverticulosis of colon   . DJD (degenerative joint disease)   . GERD (gastroesophageal reflux disease)   . Hemorrhoids   . History of colonic polyps   . History of DVT (deep vein thrombosis)   . History of kidney stones   . Hyperlipidemia   . Hypertension, benign   . Low back pain syndrome   . Nephrolithiasis   . pulmonary nodule, left lower lobe   . Varicose veins of lower extremities   . Venous insufficiency     Past Surgical History:  Procedure Laterality Date  . APPENDECTOMY    . bilateral inguinal hernia repairs    . CERVICAL DISC ARTHROPLASTY    . CHOLECYSTECTOMY  03/07/2012  . CHOLECYSTECTOMY N/A 03/07/2012   Procedure: LAPAROSCOPIC CHOLECYSTECTOMY;  Surgeon: Rolm Bookbinder, MD;  Location: [facility];  Service: General;  Laterality: N/A;  . HERNIA REPAIR     ventral hernia with mesh 1990s  . JOINT REPLACEMENT    . left ankle surgery with plate and screws  [id number]   by Dr. Sharol Given  . LUMBAR LAMINECTOMY/DECOMPRESSION MICRODISCECTOMY N/A 05/19/2016   Procedure: Microlumbar decompression L3-4, L4-5;  Surgeon: Susa Day, MD;  Location: [facility];  Service: Orthopedics;   Laterality: N/A;  . sebaceous cyst surgery    . t1-t2 disc surgery  2007   by Dr. Louanne Skye  . TONSILLECTOMY    . TOTAL KNEE ARTHROPLASTY     right and left    Family History  Problem Relation Age of Onset  . Gout Father   . Coronary artery disease Father 80       died of massive MI age 82  . Heart attack Father   . CAD Brother 78   Social History:  reports that he has never smoked. He has never used smokeless tobacco. He reports that he does not drink alcohol or use drugs. Smoking Status: Never smoker Non-smoker Alcohol intake: None Hand Dominance: Left Work related injury?: N Advance directive: Y Freight forwarder of Attorney: Y  Allergies:  Allergies  Allergen Reactions  . Ciprofloxacin     REACTION: rash  . Niacin     REACTION: hypotension, shaky   Medications alfuzosin ER 10 mg tablet,extended release 24 hr TAKE 1 TABLET BY MOUTH EVERY DAY  diclofenac sodium 75 mg tablet,delayed release  glimepiride 2 mg tablet  losartan 100 mg tablet  metFORMIN 500 mg tablet  pantoprazole 40 mg tablet,delayed release  raNITIdine 300 mg tablet  simvastatin 40 mg tablet  Stool Softener  tiZANidine  tiZANidine 2 mg tablet   Family Hx Father - Heart disease Mother - Diabetes mellitus  Review of Systems  Constitutional: Negative.  HENT: Negative.   Eyes: Negative.   Respiratory: Negative.   Cardiovascular: Negative.   Gastrointestinal: Negative.   Genitourinary: Negative.   Musculoskeletal: Positive for back pain and joint pain.  Skin: Negative.   Neurological: Positive for sensory change and focal weakness.  Psychiatric/Behavioral: Negative.     There were no vitals taken for this visit. Physical Exam  Constitutional: He is oriented to person, place, and time. He appears well-developed.  HENT:  Head: Normocephalic.  Eyes: Pupils are equal, round, and reactive to light.  Neck: Normal range of motion.  Cardiovascular: Normal rate.  Respiratory: Effort  normal.  GI: Soft.  Musculoskeletal:  Patient is a 72 year old male.  Gait and Station: Appearance: ambulating with no assistive devices and antalgic gait.  Constitutional: General Appearance: healthy-appearing and distress (mild).  Psychiatric: Mood and Affect: active and alert.  Cardiovascular System: Edema Right: none; Dorsalis and posterior tibial pulses 2+. Edema Left: none.  Abdomen: Inspection and Palpation: non-distended and no tenderness.  Skin: Inspection and palpation: no rash.  Lumbar Spine: Inspection: normal alignment. Bony Palpation of the Lumbar Spine: tender at lumbosacral junction.. Bony Palpation of the Right Hip: no tenderness of the greater trochanter and tenderness of the SI joint; Pelvis stable. Bony Palpation of the Left Hip: no tenderness of the greater trochanter and tenderness of the SI joint. Soft Tissue Palpation on the Right: No flank pain with percussion. Active Range of Motion: limited flexion and extention.  Motor Strength: L1 Motor Strength on the Right: hip flexion iliopsoas 5/5. L1 Motor Strength on the Left: hip flexion iliopsoas 5/5. L2-L4 Motor Strength on the Right: knee extension quadriceps 5/5. L2-L4 Motor Strength on the Left: knee extension quadriceps 5/5. L5 Motor Strength on the Right: ankle dorsiflexion tibialis anterior 5/5 and great toe extension extensor hallucis longus 5/5. L5 Motor Strength on the Left: ankle dorsiflexion tibialis anterior 5/5 and great toe extension extensor hallucis longus 5/5. S1 Motor Strength on the Right: plantar flexion gastrocnemius 5/5. S1 Motor Strength on the Left: plantar flexion gastrocnemius 5/5.  Neurological System: Knee Reflex Right: normal (2). Knee Reflex Left: normal (2). Ankle Reflex Right: normal (2). Ankle Reflex Left: normal (2). Babinski Reflex Right: plantar reflex absent. Babinski Reflex Left: plantar reflex absent. Sensation on the Right: normal distal extremities. Sensation on the Left: normal  distal extremities. Special Tests on the Right: no clonus of the ankle/knee. Special Tests on the Left: no clonus of the ankle/knee and seated straight leg raising test positive.  Upper extremities my motor is 5/5. He is normoreflexic. No Hoffmann sign. No escape sign. Sensory exam is intact. No Babinski or clonus. Abdomen soft. Nontender thoracic  Neurological: He is alert and oriented to person, place, and time.  Skin: Skin is warm and dry.     Assessment/Plan Spinal stenosis  Review Dr. Nelva Bush is no technically successful.  MRI demonstrates facet arthropathy and lateral recess stenosis at L5-S1.  MRI demonstrates previous lumbar decompression at 34,45. Patient demonstrates a new onset of S1 radiculopathy secondary lateral recess stenosis and facet arthropathy at L5-S1.  Best help from an epidural L5-S1 left.  We discussed activity modification.  I had an extensive discussion with the patient concerning their pathology relevant anatomy and treatment options. I discussed the concepts of disc pressure management as well as core motion. I provided the patient with an illustrated handout describing ADL modification. In addition we discussed core strengthening exercises and strategies to avoid reinjury. Appropriate follow-up was discussed as well including the indications  for further imaging such as an MRI. Risk and benefits of over-the-counter anti-inflammatories and/or Tylenol were discussed including their appropriate monitoring. Further options including form of supervised physical therapy as well as possible injection therapy and indications for surgical intervention were discussed as well.  He would like to have a refill on his muscle relaxer  They will call if the pain returns we discussed microlumbar decompression L5-S1 the left. I will discuss this with him over the phone.  If therapeutic for 3-4 months we discussed repeat injection at L5-S1 on the left.  He has multilevel disc  degeneration and any surgery to address the entire pathology would require a multilevel fusion. He is not interested in that at this point.  Plan microlumbar decompression L5-S1, possible L4-5 left  BISSELL, Conley Rolls., PA-C for Dr. Tonita Cong 05/03/2017, 11:40 AM

## 2017-05-12 NOTE — Pre-Procedure Instructions (Addendum)
JARREAU CALLANAN  05/13/2017      [id number] [address], [place] [address] [place] [address] Phone: [phone] Fax: [place], [address] [place] [address] Phone: [phone] Fax: [phone]    Your procedure is scheduled on Thurs., May 19, 2017  Report to [facility] Admitting Entrance "A" at 5:30AM  Call this number if you have problems the morning of surgery:  [phone]   Remember:  Do not eat food or drink liquids after midnight.  Take these medicines the morning of surgery with A SIP OF WATER: Cetirizine (ZYRTEC), Pantoprazole (PROTONIX), and Alfuzosin (UROXATRAL). If needed Acetaminophen (TYLENOL) for pain and Gabapentin (NEURONTIN) for nerve pain  Follow your doctors instructions regarding your Aspirin.  If no instructions were given by your doctor, then you will need to call the prescribing office office to get instructions.    As of today, stop taking all Aspirins, Vitamins, Fish oils, and Herbal medications. Also stop all NSAIDS i.e. Advil, Ibuprofen, Motrin, Aleve, Anaprox, Naproxen, BC and Goody Powders.Including: Diclofenac (VOLTAREN)  How to Manage Your Diabetes Before and After Surgery  Why is it important to control my blood sugar before and after surgery? . Improving blood sugar levels before and after surgery helps healing and can limit problems. . A way of improving blood sugar control is eating a healthy diet by: o  Eating less sugar and carbohydrates o  Increasing activity/exercise o  Talking with your doctor about reaching your blood sugar goals . High blood sugars (greater than 180 mg/dL) can raise your risk of infections and slow your recovery, so you will need to focus on controlling your diabetes during the weeks before surgery. . Make sure that the doctor who takes care of your diabetes knows about your planned  surgery including the date and location.  How do I manage my blood sugar before surgery? . Check your blood sugar at least 4 times a day, starting 2 days before surgery, to make sure that the level is not too high or low. o Check your blood sugar the morning of your surgery when you wake up and every 2 hours until you get to the Short Stay unit. . If your blood sugar is less than 70 mg/dL, you will need to treat for low blood sugar: o Do not take insulin. o Treat a low blood sugar (less than 70 mg/dL) with  cup of clear juice (cranberry or apple), 4 glucose tablets, OR glucose gel. Recheck blood sugar in 15 minutes after treatment (to make sure it is greater than 70 mg/dL). If your blood sugar is not greater than 70 mg/dL on recheck, call [phone] o  for further instructions. . Report your blood sugar to the short stay nurse when you get to Short Stay.  . If you are admitted to the hospital after surgery: o Your blood sugar will be checked by the staff and you will probably be given insulin after surgery (instead of oral diabetes medicines) to make sure you have good blood sugar levels. o The goal for blood sugar control after surgery is 80-180 mg/dL.  WHAT DO I DO ABOUT MY DIABETES MEDICATION?  [name] Do not take Glimepiride (AMARYL) and MetFORMIN (GLUCOPHAGE)  the morning of surgery.  . If your CBG is greater than 220 mg/dL, call us at [phone]  Reviewed and Endorsed by [facility] Patient  Education Committee, August 2015   Do not wear jewelry.  Do not wear lotions, powders, colognes, or deodorant.  Do not shave 48 hours prior to surgery.  Men may shave face.  Do not bring valuables to the hospital.  [facility] is not responsible for any belongings or valuables.  Contacts, dentures or bridgework may not be worn into surgery.  Leave your suitcase in the car.  After surgery it may be brought to your room.  For patients admitted to the hospital, discharge time will be determined  by your treatment team.  Patients discharged the day of surgery will not be allowed to drive home.   Special instructions:   [facility]- Preparing For Surgery  Before surgery, you can play an important role. Because skin is not sterile, your skin needs to be as free of germs as possible. You can reduce the number of germs on your skin by washing with CHG (chlorahexidine gluconate) Soap before surgery.  CHG is an antiseptic cleaner which kills germs and bonds with the skin to continue killing germs even after washing.  Oral Hygiene is also important to reduce your risk of infection.  Remember - BRUSH YOUR TEETH THE MORNING OF SURGERY  Please do not use if you have an allergy to CHG or antibacterial soaps. If your skin becomes reddened/irritated stop using the CHG.  Do not shave (including legs and underarms) for at least 48 hours prior to first CHG shower. It is OK to shave your face.  Please follow these instructions carefully.   1. Shower the NIGHT BEFORE SURGERY and the MORNING OF SURGERY with CHG.   2. If you chose to wash your hair, wash your hair first as usual with your normal shampoo.  3. After you shampoo, rinse your hair and body thoroughly to remove the shampoo.  4. Use CHG as you would any other liquid soap. You can apply CHG directly to the skin and wash gently with a scrungie or a clean washcloth.   5. Apply the CHG Soap to your body ONLY FROM THE NECK DOWN.  Do not use on open wounds or open sores. Avoid contact with your eyes, ears, mouth and genitals (private parts). Wash Face and genitals (private parts)  with your normal soap.  6. Wash thoroughly, paying special attention to the area where your surgery will be performed.  7. Thoroughly rinse your body with warm water from the neck down.  8. DO NOT shower/wash with your normal soap after using and rinsing off the CHG Soap.  9. Pat yourself dry with a CLEAN TOWEL.  10. Wear CLEAN PAJAMAS to bed the night before  surgery, wear comfortable clothes the morning of surgery  11. Place CLEAN SHEETS on your bed the night of your first shower and DO NOT SLEEP WITH PETS.  Day of Surgery: Do not apply any deodorants/lotions. Please wear clean clothes to the hospital/surgery center.  Remember to brush your teeth.    Please read over the following fact sheets that you were given. Pain Booklet, Coughing and Deep Breathing, MRSA Information and Surgical Site Infection Prevention

## 2017-05-13 ENCOUNTER — Other Ambulatory Visit: Payer: Self-pay

## 2017-05-13 ENCOUNTER — Encounter (HOSPITAL_COMMUNITY): Payer: Self-pay

## 2017-05-13 ENCOUNTER — Ambulatory Visit (HOSPITAL_COMMUNITY)
Admission: RE | Admit: 2017-05-13 | Discharge: 2017-05-13 | Disposition: A | Discharge status: Home / Self Care | Payer: Medicare Other | Source: Ambulatory Visit | Attending: Orthopedic Surgery | Admitting: Orthopedic Surgery

## 2017-05-13 ENCOUNTER — Encounter (HOSPITAL_COMMUNITY)
Admission: RE | Admit: 2017-05-13 | Discharge: 2017-05-13 | Disposition: A | Discharge status: Home / Self Care | Payer: Medicare Other | Source: Ambulatory Visit | Attending: Specialist | Admitting: Specialist

## 2017-05-13 ENCOUNTER — Ambulatory Visit: Payer: Self-pay | Admitting: Orthopedic Surgery

## 2017-05-13 DIAGNOSIS — Z01812 Encounter for preprocedural laboratory examination: Secondary | ICD-10-CM | POA: Diagnosis not present

## 2017-05-13 DIAGNOSIS — M47816 Spondylosis without myelopathy or radiculopathy, lumbar region: Secondary | ICD-10-CM | POA: Diagnosis not present

## 2017-05-13 DIAGNOSIS — Z01818 Encounter for other preprocedural examination: Secondary | ICD-10-CM | POA: Insufficient documentation

## 2017-05-13 DIAGNOSIS — M5126 Other intervertebral disc displacement, lumbar region: Secondary | ICD-10-CM

## 2017-05-13 HISTORY — DX: Spinal stenosis, site unspecified: M48.00

## 2017-05-13 LAB — BASIC METABOLIC PANEL
ANION GAP: 9 (ref 5–15)
BUN: 13 mg/dL (ref 6–20)
CO2: 24 mmol/L (ref 22–32)
CREATININE: 1.05 mg/dL (ref 0.61–1.24)
Calcium: 9 mg/dL (ref 8.9–10.3)
Chloride: 107 mmol/L (ref 101–111)
GFR calc non Af Amer: >60 mL/min (ref >60)
Glucose, Bld: 133 mg/dL — ABNORMAL HIGH (ref 65–99)
POTASSIUM: 4.6 mmol/L (ref 3.5–5.1)
SODIUM: 140 mmol/L (ref 135–145)

## 2017-05-13 LAB — CBC
HCT: 48.3 % (ref 39.0–52.0)
HEMOGLOBIN: 15.6 g/dL (ref 13.0–17.0)
MCH: 30.5 pg (ref 26.0–34.0)
MCHC: 32.3 g/dL (ref 30.0–36.0)
MCV: 94.3 fL (ref 78.0–100.0)
PLATELETS: 275 10*3/uL (ref 150–400)
RBC: 5.12 MIL/uL (ref 4.22–5.81)
RDW: 13.7 % (ref 11.5–15.5)
WBC: 6.8 10*3/uL (ref 4.0–10.5)

## 2017-05-13 LAB — SURGICAL PCR SCREEN
MRSA, PCR: NEGATIVE
STAPHYLOCOCCUS AUREUS: POSITIVE — AB

## 2017-05-13 LAB — HEMOGLOBIN A1C
Hgb A1c MFr Bld: 6.1 % — ABNORMAL HIGH (ref 4.8–5.6)
Mean Plasma Glucose: 128.37 mg/dL

## 2017-05-13 LAB — GLUCOSE, CAPILLARY: Glucose-Capillary: 138 mg/dL — ABNORMAL HIGH (ref 65–99)

## 2017-05-13 NOTE — Progress Notes (Signed)
Pt made aware of positive Staph pcr result. [facility]

## 2017-05-13 NOTE — Progress Notes (Addendum)
PCP - Dr. Raliegh Ip. Tabori  Cardiologist - Denies  Chest x-ray - 05/13/17- Lumbar  EKG - 04/06/17 (E)  Stress Test - > 46yrs ago- Negative  ECHO - Denies  Cardiac Cath - Denies  Sleep Study - Denies CPAP - None  LABS- 05/13/17:CBC, BMP  ASA- LD-5/9  HA1C- 05/13/17 Fasting Blood Sugar - Today 138 Checks Blood Sugar __0___ times a day- Pt gets bs checks every 3 months at the pcp office  Pt and wife are very concerned about his kidneys shutting down after surgery. They are stressing the importance of the pt being catheterized prior to surgery to prevent this.  Anesthesia- No  Pt denies having chest pain, sob, or fever at this time. All instructions explained to the pt, with a verbal understanding of the material. Pt agrees to go over the instructions while at home for a better understanding. The opportunity to ask questions was provided.

## 2017-05-17 ENCOUNTER — Ambulatory Visit: Payer: Medicare Other | Admitting: Family Medicine

## 2017-05-19 ENCOUNTER — Ambulatory Visit (HOSPITAL_COMMUNITY): Payer: Medicare Other

## 2017-05-19 ENCOUNTER — Encounter (HOSPITAL_COMMUNITY): Payer: Self-pay | Admitting: *Deleted

## 2017-05-19 ENCOUNTER — Ambulatory Visit (HOSPITAL_COMMUNITY)
Admission: RE | Disposition: A | Discharge status: Home / Self Care | Payer: Self-pay | Source: Ambulatory Visit | Attending: Specialist

## 2017-05-19 ENCOUNTER — Ambulatory Visit (HOSPITAL_COMMUNITY)
Admission: RE | Admit: 2017-05-19 | Discharge: 2017-05-20 | Disposition: A | Discharge status: Home / Self Care | Payer: Medicare Other | Source: Ambulatory Visit | Attending: Specialist | Admitting: Specialist

## 2017-05-19 ENCOUNTER — Ambulatory Visit (HOSPITAL_COMMUNITY): Payer: Medicare Other | Admitting: Certified Registered Nurse Anesthetist

## 2017-05-19 DIAGNOSIS — Z881 Allergy status to other antibiotic agents status: Secondary | ICD-10-CM | POA: Diagnosis not present

## 2017-05-19 DIAGNOSIS — Z7984 Long term (current) use of oral hypoglycemic drugs: Secondary | ICD-10-CM | POA: Insufficient documentation

## 2017-05-19 DIAGNOSIS — M4807 Spinal stenosis, lumbosacral region: Secondary | ICD-10-CM | POA: Insufficient documentation

## 2017-05-19 DIAGNOSIS — M5116 Intervertebral disc disorders with radiculopathy, lumbar region: Secondary | ICD-10-CM | POA: Diagnosis not present

## 2017-05-19 DIAGNOSIS — E785 Hyperlipidemia, unspecified: Secondary | ICD-10-CM | POA: Diagnosis not present

## 2017-05-19 DIAGNOSIS — M1990 Unspecified osteoarthritis, unspecified site: Secondary | ICD-10-CM | POA: Insufficient documentation

## 2017-05-19 DIAGNOSIS — Z419 Encounter for procedure for purposes other than remedying health state, unspecified: Secondary | ICD-10-CM

## 2017-05-19 DIAGNOSIS — F419 Anxiety disorder, unspecified: Secondary | ICD-10-CM | POA: Diagnosis not present

## 2017-05-19 DIAGNOSIS — K219 Gastro-esophageal reflux disease without esophagitis: Secondary | ICD-10-CM | POA: Diagnosis not present

## 2017-05-19 DIAGNOSIS — M48061 Spinal stenosis, lumbar region without neurogenic claudication: Secondary | ICD-10-CM

## 2017-05-19 DIAGNOSIS — M5126 Other intervertebral disc displacement, lumbar region: Secondary | ICD-10-CM | POA: Diagnosis present

## 2017-05-19 DIAGNOSIS — E1151 Type 2 diabetes mellitus with diabetic peripheral angiopathy without gangrene: Secondary | ICD-10-CM | POA: Insufficient documentation

## 2017-05-19 DIAGNOSIS — Z79899 Other long term (current) drug therapy: Secondary | ICD-10-CM | POA: Insufficient documentation

## 2017-05-19 DIAGNOSIS — Z87442 Personal history of urinary calculi: Secondary | ICD-10-CM | POA: Diagnosis not present

## 2017-05-19 DIAGNOSIS — E119 Type 2 diabetes mellitus without complications: Secondary | ICD-10-CM | POA: Diagnosis not present

## 2017-05-19 DIAGNOSIS — I10 Essential (primary) hypertension: Secondary | ICD-10-CM | POA: Insufficient documentation

## 2017-05-19 DIAGNOSIS — M4326 Fusion of spine, lumbar region: Secondary | ICD-10-CM | POA: Diagnosis not present

## 2017-05-19 DIAGNOSIS — Z86718 Personal history of other venous thrombosis and embolism: Secondary | ICD-10-CM | POA: Insufficient documentation

## 2017-05-19 HISTORY — PX: LUMBAR LAMINECTOMY/DECOMPRESSION MICRODISCECTOMY: SHX5026

## 2017-05-19 LAB — GLUCOSE, CAPILLARY
GLUCOSE-CAPILLARY: 145 mg/dL — AB (ref 65–99)
GLUCOSE-CAPILLARY: 134 mg/dL — AB (ref 65–99)
Glucose-Capillary: 125 mg/dL — ABNORMAL HIGH (ref 65–99)
Glucose-Capillary: 155 mg/dL — ABNORMAL HIGH (ref 65–99)
Glucose-Capillary: 171 mg/dL — ABNORMAL HIGH (ref 65–99)

## 2017-05-19 SURGERY — LUMBAR LAMINECTOMY/DECOMPRESSION MICRODISCECTOMY 2 LEVELS
Anesthesia: General | Site: Spine Lumbar

## 2017-05-19 MED ORDER — MENTHOL 3 MG MT LOZG: 1.0000 | LOZENGE | OROMUCOSAL | Status: DC | PRN

## 2017-05-19 MED ORDER — CEFAZOLIN SODIUM-DEXTROSE 2-4 GM/100ML-% IV SOLN
2.0000 g | Freq: Three times a day (TID) | INTRAVENOUS | Status: AC
  Administered 2017-05-19 (×2): 2 g via INTRAVENOUS
  Filled 2017-05-19 (×2): qty 100

## 2017-05-19 MED ORDER — SUGAMMADEX SODIUM 200 MG/2ML IV SOLN
INTRAVENOUS | Status: DC | PRN
  Administered 2017-05-19: 228.6 mg via INTRAVENOUS

## 2017-05-19 MED ORDER — INSULIN ASPART 100 UNIT/ML ~~LOC~~ SOLN
0.0000 [IU] | Freq: Three times a day (TID) | SUBCUTANEOUS | Status: DC
  Administered 2017-05-19 – 2017-05-20 (×2): 3 [IU] via SUBCUTANEOUS

## 2017-05-19 MED ORDER — THROMBIN 20000 UNITS EX SOLR
CUTANEOUS | Status: AC
Start: 2017-05-19 — End: ?
  Filled 2017-05-19: qty 20000

## 2017-05-19 MED ORDER — LACTATED RINGERS IV SOLN: INTRAVENOUS | Status: DC

## 2017-05-19 MED ORDER — ACETAMINOPHEN 650 MG RE SUPP: 650.0000 mg | RECTAL | Status: DC | PRN

## 2017-05-19 MED ORDER — LIDOCAINE HCL (CARDIAC) PF 100 MG/5ML IV SOSY
PREFILLED_SYRINGE | INTRAVENOUS | Status: DC | PRN
  Administered 2017-05-19: 60 mg via INTRAVENOUS

## 2017-05-19 MED ORDER — PROPOFOL 10 MG/ML IV BOLUS
INTRAVENOUS | Status: AC
  Filled 2017-05-19: qty 40

## 2017-05-19 MED ORDER — OXYCODONE HCL 5 MG/5ML PO SOLN: 5.0000 mg | Freq: Once | ORAL | Status: AC | PRN

## 2017-05-19 MED ORDER — ALBUMIN HUMAN 5 % IV SOLN
INTRAVENOUS | Status: DC | PRN
  Administered 2017-05-19: 09:00:00 via INTRAVENOUS

## 2017-05-19 MED ORDER — PROPOFOL 10 MG/ML IV BOLUS
INTRAVENOUS | Status: DC | PRN
  Administered 2017-05-19: 130 mg via INTRAVENOUS

## 2017-05-19 MED ORDER — ONDANSETRON HCL 4 MG/2ML IJ SOLN
INTRAMUSCULAR | Status: DC | PRN
  Administered 2017-05-19: 4 mg via INTRAVENOUS

## 2017-05-19 MED ORDER — SURGIFOAM 100 EX MISC
CUTANEOUS | Status: DC | PRN
  Administered 2017-05-19: 20 mL via TOPICAL

## 2017-05-19 MED ORDER — PHENOL 1.4 % MT LIQD: 1.0000 | OROMUCOSAL | Status: DC | PRN

## 2017-05-19 MED ORDER — FENTANYL CITRATE (PF) 250 MCG/5ML IJ SOLN
INTRAMUSCULAR | Status: DC | PRN
  Administered 2017-05-19: 50 ug via INTRAVENOUS
  Administered 2017-05-19: 25 ug via INTRAVENOUS
  Administered 2017-05-19: 50 ug via INTRAVENOUS
  Administered 2017-05-19: 150 ug via INTRAVENOUS

## 2017-05-19 MED ORDER — OXYCODONE HCL 5 MG PO TABS
ORAL_TABLET | ORAL | Status: AC
  Filled 2017-05-19: qty 1

## 2017-05-19 MED ORDER — HYDROMORPHONE HCL 2 MG/ML IJ SOLN: 0.2500 mg | INTRAMUSCULAR | Status: DC | PRN

## 2017-05-19 MED ORDER — BUPIVACAINE-EPINEPHRINE (PF) 0.5% -1:200000 IJ SOLN
INTRAMUSCULAR | Status: AC
  Filled 2017-05-19: qty 30

## 2017-05-19 MED ORDER — DEXAMETHASONE SODIUM PHOSPHATE 10 MG/ML IJ SOLN
INTRAMUSCULAR | Status: AC
  Filled 2017-05-19: qty 1

## 2017-05-19 MED ORDER — OXYCODONE HCL 5 MG PO TABS
5.0000 mg | ORAL_TABLET | Freq: Once | ORAL | Status: AC | PRN
  Administered 2017-05-19: 5 mg via ORAL

## 2017-05-19 MED ORDER — MEPERIDINE HCL 50 MG/ML IJ SOLN: 6.2500 mg | INTRAMUSCULAR | Status: DC | PRN

## 2017-05-19 MED ORDER — POLYETHYLENE GLYCOL 3350 17 G PO PACK: 17.0000 g | PACK | Freq: Every day | ORAL | Status: DC | PRN

## 2017-05-19 MED ORDER — POTASSIUM CHLORIDE IN NACL 20-0.45 MEQ/L-% IV SOLN
INTRAVENOUS | Status: AC
  Administered 2017-05-19: 13:00:00 via INTRAVENOUS
  Filled 2017-05-19 (×2): qty 1000

## 2017-05-19 MED ORDER — ADULT MULTIVITAMIN W/MINERALS CH
1.0000 | ORAL_TABLET | Freq: Every day | ORAL | Status: DC
  Administered 2017-05-19 – 2017-05-20 (×2): 1 via ORAL
  Filled 2017-05-19 (×2): qty 1

## 2017-05-19 MED ORDER — MAGNESIUM CITRATE PO SOLN: 1.0000 | Freq: Once | ORAL | Status: DC | PRN

## 2017-05-19 MED ORDER — ACETAMINOPHEN 500 MG PO TABS: 1000.0000 mg | ORAL_TABLET | Freq: Four times a day (QID) | ORAL | Status: DC | PRN

## 2017-05-19 MED ORDER — DOCUSATE SODIUM 100 MG PO CAPS: 100.0000 mg | ORAL_CAPSULE | Freq: Two times a day (BID) | ORAL | Status: DC | PRN

## 2017-05-19 MED ORDER — ALFUZOSIN HCL ER 10 MG PO TB24
10.0000 mg | ORAL_TABLET | Freq: Every day | ORAL | Status: DC
  Administered 2017-05-20: 10 mg via ORAL
  Filled 2017-05-19 (×2): qty 1

## 2017-05-19 MED ORDER — ONDANSETRON HCL 4 MG PO TABS: 4.0000 mg | ORAL_TABLET | Freq: Four times a day (QID) | ORAL | Status: DC | PRN

## 2017-05-19 MED ORDER — PROMETHAZINE HCL 25 MG/ML IJ SOLN: 6.2500 mg | INTRAMUSCULAR | Status: DC | PRN

## 2017-05-19 MED ORDER — METHOCARBAMOL 500 MG PO TABS
500.0000 mg | ORAL_TABLET | Freq: Four times a day (QID) | ORAL | Status: DC | PRN
  Administered 2017-05-19: 500 mg via ORAL
  Filled 2017-05-19: qty 1

## 2017-05-19 MED ORDER — EPHEDRINE SULFATE 50 MG/ML IJ SOLN
INTRAMUSCULAR | Status: DC | PRN
  Administered 2017-05-19: 5 mg via INTRAVENOUS
  Administered 2017-05-19: 10 mg via INTRAVENOUS
  Administered 2017-05-19: 5 mg via INTRAVENOUS
  Administered 2017-05-19: 10 mg via INTRAVENOUS
  Administered 2017-05-19 (×2): 5 mg via INTRAVENOUS

## 2017-05-19 MED ORDER — ACETAMINOPHEN 10 MG/ML IV SOLN
1000.0000 mg | INTRAVENOUS | Status: AC
  Administered 2017-05-19: 1000 mg via INTRAVENOUS
  Filled 2017-05-19: qty 100

## 2017-05-19 MED ORDER — ACETAMINOPHEN 325 MG PO TABS: 650.0000 mg | ORAL_TABLET | ORAL | Status: DC | PRN

## 2017-05-19 MED ORDER — OXYCODONE HCL 5 MG PO TABS
5.0000 mg | ORAL_TABLET | ORAL | Status: DC | PRN
  Filled 2017-05-19: qty 1

## 2017-05-19 MED ORDER — ONDANSETRON HCL 4 MG/2ML IJ SOLN: 4.0000 mg | Freq: Four times a day (QID) | INTRAMUSCULAR | Status: DC | PRN

## 2017-05-19 MED ORDER — LACTATED RINGERS IV SOLN
INTRAVENOUS | Status: DC | PRN
  Administered 2017-05-19: 07:00:00 via INTRAVENOUS

## 2017-05-19 MED ORDER — SUGAMMADEX SODIUM 200 MG/2ML IV SOLN
INTRAVENOUS | Status: AC
  Filled 2017-05-19: qty 2

## 2017-05-19 MED ORDER — PHENYLEPHRINE HCL 10 MG/ML IJ SOLN
INTRAMUSCULAR | Status: DC | PRN
  Administered 2017-05-19: 80 ug via INTRAVENOUS
  Administered 2017-05-19: 120 ug via INTRAVENOUS
  Administered 2017-05-19: 80 ug via INTRAVENOUS

## 2017-05-19 MED ORDER — PHENYLEPHRINE 40 MCG/ML (10ML) SYRINGE FOR IV PUSH (FOR BLOOD PRESSURE SUPPORT)
PREFILLED_SYRINGE | INTRAVENOUS | Status: AC
  Filled 2017-05-19: qty 10

## 2017-05-19 MED ORDER — RISAQUAD PO CAPS
1.0000 | ORAL_CAPSULE | Freq: Every day | ORAL | Status: DC
  Administered 2017-05-19: 1 via ORAL
  Filled 2017-05-19 (×2): qty 1

## 2017-05-19 MED ORDER — CLINDAMYCIN PHOSPHATE 900 MG/50ML IV SOLN
900.0000 mg | INTRAVENOUS | Status: AC
Start: 2017-05-19 — End: 2017-05-19
  Administered 2017-05-19: 900 mg via INTRAVENOUS
  Filled 2017-05-19: qty 50

## 2017-05-19 MED ORDER — ONDANSETRON HCL 4 MG/2ML IJ SOLN
INTRAMUSCULAR | Status: AC
Start: 2017-05-19 — End: ?
  Filled 2017-05-19: qty 2

## 2017-05-19 MED ORDER — BACITRACIN 50000 UNITS IM SOLR
INTRAMUSCULAR | Status: DC | PRN
  Administered 2017-05-19: 500 mL

## 2017-05-19 MED ORDER — GABAPENTIN 100 MG PO CAPS
100.0000 mg | ORAL_CAPSULE | Freq: Every day | ORAL | Status: DC
  Administered 2017-05-19: 100 mg via ORAL
  Filled 2017-05-19: qty 1

## 2017-05-19 MED ORDER — ROCURONIUM BROMIDE 50 MG/5ML IV SOLN
INTRAVENOUS | Status: AC
Start: 2017-05-19 — End: ?
  Filled 2017-05-19: qty 1

## 2017-05-19 MED ORDER — ROCURONIUM BROMIDE 100 MG/10ML IV SOLN
INTRAVENOUS | Status: DC | PRN
  Administered 2017-05-19: 10 mg via INTRAVENOUS
  Administered 2017-05-19: 70 mg via INTRAVENOUS

## 2017-05-19 MED ORDER — LIDOCAINE 2% (20 MG/ML) 5 ML SYRINGE
INTRAMUSCULAR | Status: AC
  Filled 2017-05-19: qty 5

## 2017-05-19 MED ORDER — 0.9 % SODIUM CHLORIDE (POUR BTL) OPTIME
TOPICAL | Status: DC | PRN
  Administered 2017-05-19: 1000 mL

## 2017-05-19 MED ORDER — FAMOTIDINE 20 MG PO TABS
20.0000 mg | ORAL_TABLET | Freq: Every day | ORAL | Status: DC
  Administered 2017-05-19 – 2017-05-20 (×2): 20 mg via ORAL
  Filled 2017-05-19 (×2): qty 1

## 2017-05-19 MED ORDER — CEFAZOLIN SODIUM-DEXTROSE 2-4 GM/100ML-% IV SOLN
2.0000 g | INTRAVENOUS | Status: AC
  Administered 2017-05-19: 2 g via INTRAVENOUS
  Filled 2017-05-19: qty 100

## 2017-05-19 MED ORDER — DOCUSATE SODIUM 100 MG PO CAPS
100.0000 mg | ORAL_CAPSULE | Freq: Two times a day (BID) | ORAL | Status: DC
  Administered 2017-05-19 – 2017-05-20 (×3): 100 mg via ORAL
  Filled 2017-05-19 (×3): qty 1

## 2017-05-19 MED ORDER — PANTOPRAZOLE SODIUM 40 MG PO TBEC
40.0000 mg | DELAYED_RELEASE_TABLET | Freq: Every day | ORAL | Status: DC
  Administered 2017-05-19: 40 mg via ORAL
  Filled 2017-05-19: qty 1

## 2017-05-19 MED ORDER — ALFUZOSIN HCL ER 10 MG PO TB24
10.0000 mg | ORAL_TABLET | Freq: Every day | ORAL | Status: DC
  Filled 2017-05-19: qty 1

## 2017-05-19 MED ORDER — LORATADINE 10 MG PO TABS
10.0000 mg | ORAL_TABLET | Freq: Every day | ORAL | Status: DC
  Administered 2017-05-20: 10 mg via ORAL
  Filled 2017-05-19 (×2): qty 1

## 2017-05-19 MED ORDER — OXYCODONE HCL 5 MG PO TABS: 10.0000 mg | ORAL_TABLET | ORAL | Status: DC | PRN

## 2017-05-19 MED ORDER — BISACODYL 5 MG PO TBEC: 5.0000 mg | DELAYED_RELEASE_TABLET | Freq: Every day | ORAL | Status: DC | PRN

## 2017-05-19 MED ORDER — HYDROMORPHONE HCL 1 MG/ML IJ SOLN: 0.5000 mg | INTRAMUSCULAR | Status: DC | PRN

## 2017-05-19 MED ORDER — PHENYLEPHRINE HCL 10 MG/ML IJ SOLN
INTRAVENOUS | Status: DC | PRN
  Administered 2017-05-19: 25 ug/min via INTRAVENOUS

## 2017-05-19 MED ORDER — BUPIVACAINE-EPINEPHRINE 0.5% -1:200000 IJ SOLN
INTRAMUSCULAR | Status: DC | PRN
  Administered 2017-05-19: 3 mL

## 2017-05-19 MED ORDER — METHOCARBAMOL 500 MG PO TABS
ORAL_TABLET | ORAL | Status: AC
  Filled 2017-05-19: qty 1

## 2017-05-19 MED ORDER — LOSARTAN POTASSIUM 50 MG PO TABS
100.0000 mg | ORAL_TABLET | Freq: Every day | ORAL | Status: DC
  Administered 2017-05-19 – 2017-05-20 (×2): 100 mg via ORAL
  Filled 2017-05-19 (×2): qty 2

## 2017-05-19 MED ORDER — FENTANYL CITRATE (PF) 250 MCG/5ML IJ SOLN
INTRAMUSCULAR | Status: AC
  Filled 2017-05-19: qty 5

## 2017-05-19 MED ORDER — ALUM & MAG HYDROXIDE-SIMETH 200-200-20 MG/5ML PO SUSP: 30.0000 mL | Freq: Four times a day (QID) | ORAL | Status: DC | PRN

## 2017-05-19 MED ORDER — METHOCARBAMOL 1000 MG/10ML IJ SOLN
500.0000 mg | Freq: Four times a day (QID) | INTRAVENOUS | Status: DC | PRN
  Filled 2017-05-19: qty 5

## 2017-05-19 MED ORDER — EPHEDRINE SULFATE 50 MG/ML IJ SOLN
INTRAMUSCULAR | Status: AC
  Filled 2017-05-19: qty 1

## 2017-05-19 SURGICAL SUPPLY — 61 items
BAG DECANTER FOR FLEXI CONT (MISCELLANEOUS) ×2 IMPLANT
CLOTH 2% CHLOROHEXIDINE 3PK (PERSONAL CARE ITEMS) ×2 IMPLANT
CONT SPEC 4OZ CLIKSEAL STRL BL (MISCELLANEOUS) ×2 IMPLANT
DRAPE LAPAROTOMY 100X72X124 (DRAPES) ×2 IMPLANT
DRAPE MICROSCOPE LEICA (MISCELLANEOUS) ×2 IMPLANT
DRAPE SHEET LG 3/4 BI-LAMINATE (DRAPES) ×2 IMPLANT
DRAPE SURG 17X11 SM STRL (DRAPES) ×2 IMPLANT
DRAPE UTILITY XL STRL (DRAPES) ×2 IMPLANT
DRSG AQUACEL AG ADV 3.5X 4 (GAUZE/BANDAGES/DRESSINGS) IMPLANT
DRSG AQUACEL AG ADV 3.5X 6 (GAUZE/BANDAGES/DRESSINGS) IMPLANT
DRSG TELFA 3X8 NADH (GAUZE/BANDAGES/DRESSINGS) IMPLANT
DURAPREP 26ML APPLICATOR (WOUND CARE) ×2 IMPLANT
DURASEAL SPINE SEALANT 3ML (MISCELLANEOUS) IMPLANT
ELECT BLADE 4.0 EZ CLEAN MEGAD (MISCELLANEOUS) ×2
ELECT REM PT RETURN 9FT ADLT (ELECTROSURGICAL) ×2
ELECTRODE BLDE 4.0 EZ CLN MEGD (MISCELLANEOUS) IMPLANT
ELECTRODE REM PT RTRN 9FT ADLT (ELECTROSURGICAL) ×1 IMPLANT
EVACUATOR 1/8 PVC DRAIN (DRAIN) ×1 IMPLANT
GAUZE SPONGE 4X4 12PLY STRL LF (GAUZE/BANDAGES/DRESSINGS) ×1 IMPLANT
GLOVE BIOGEL PI IND STRL 6.5 (GLOVE) IMPLANT
GLOVE BIOGEL PI IND STRL 7.0 (GLOVE) ×1 IMPLANT
GLOVE BIOGEL PI IND STRL 7.5 (GLOVE) IMPLANT
GLOVE BIOGEL PI INDICATOR 6.5 (GLOVE) ×1
GLOVE BIOGEL PI INDICATOR 7.0 (GLOVE) ×2
GLOVE BIOGEL PI INDICATOR 7.5 (GLOVE) ×3
GLOVE SURG SS PI 7.5 STRL IVOR (GLOVE) ×2 IMPLANT
GLOVE SURG SS PI 8.0 STRL IVOR (GLOVE) ×4 IMPLANT
GOWN STRL REUS W/ TWL LRG LVL3 (GOWN DISPOSABLE) ×1 IMPLANT
GOWN STRL REUS W/ TWL XL LVL3 (GOWN DISPOSABLE) ×1 IMPLANT
GOWN STRL REUS W/TWL LRG LVL3 (GOWN DISPOSABLE) ×2
GOWN STRL REUS W/TWL XL LVL3 (GOWN DISPOSABLE) ×4
IV CATH 14GX2 1/4 (CATHETERS) ×2 IMPLANT
KIT BASIN OR (CUSTOM PROCEDURE TRAY) ×2 IMPLANT
NDL SPNL 18GX3.5 QUINCKE PK (NEEDLE) ×2 IMPLANT
NEEDLE 22X1 1/2 (OR ONLY) (NEEDLE) ×2 IMPLANT
NEEDLE SPNL 18GX3.5 QUINCKE PK (NEEDLE) ×4 IMPLANT
PACK LAMINECTOMY NEURO (CUSTOM PROCEDURE TRAY) ×2 IMPLANT
PAD ABD 8X10 STRL (GAUZE/BANDAGES/DRESSINGS) ×2 IMPLANT
PAD DRESSING TELFA 3X8 NADH (GAUZE/BANDAGES/DRESSINGS) IMPLANT
PATTIES SURGICAL .75X.75 (GAUZE/BANDAGES/DRESSINGS) ×1 IMPLANT
RUBBERBAND STERILE (MISCELLANEOUS) ×4 IMPLANT
SPONGE LAP 4X18 X RAY DECT (DISPOSABLE) IMPLANT
SPONGE SURGIFOAM ABS GEL 100 (HEMOSTASIS) ×2 IMPLANT
STAPLER VISISTAT (STAPLE) IMPLANT
STAPLER VISISTAT 35W (STAPLE) ×1 IMPLANT
STRIP CLOSURE SKIN 1/2X4 (GAUZE/BANDAGES/DRESSINGS) ×2 IMPLANT
SUT BONE WAX W31G (SUTURE) ×1 IMPLANT
SUT NURALON 4 0 TR CR/8 (SUTURE) IMPLANT
SUT PROLENE 3 0 PS 2 (SUTURE) IMPLANT
SUT VIC AB 1 CT1 27 (SUTURE) ×6
SUT VIC AB 1 CT1 27XBRD ANBCTR (SUTURE) IMPLANT
SUT VIC AB 1 CT1 27XBRD ANTBC (SUTURE) IMPLANT
SUT VIC AB 1-0 CT2 27 (SUTURE) IMPLANT
SUT VIC AB 2-0 CT1 27 (SUTURE) ×4
SUT VIC AB 2-0 CT1 TAPERPNT 27 (SUTURE) IMPLANT
SUT VIC AB 2-0 CT2 27 (SUTURE) IMPLANT
SYR 3ML LL SCALE MARK (SYRINGE) ×2 IMPLANT
TAPE CLOTH SURG 4X10 WHT LF (GAUZE/BANDAGES/DRESSINGS) ×1 IMPLANT
TOWEL GREEN STERILE (TOWEL DISPOSABLE) ×2 IMPLANT
TOWEL GREEN STERILE FF (TOWEL DISPOSABLE) ×2 IMPLANT
YANKAUER SUCT BULB TIP NO VENT (SUCTIONS) ×2 IMPLANT

## 2017-05-19 NOTE — Anesthesia Procedure Notes (Signed)
Procedure Name: Intubation Date/Time: 05/19/2017 7:40 AM Performed by: Glynda Jaeger, CRNA Pre-anesthesia Checklist: Patient identified, Patient being monitored, Timeout performed, Emergency Drugs available and Suction available Patient Re-evaluated:Patient Re-evaluated prior to induction Oxygen Delivery Method: Circle System Utilized Preoxygenation: Pre-oxygenation with 100% oxygen Induction Type: IV induction Ventilation: Mask ventilation without difficulty Laryngoscope Size: Mac and 4 Grade View: Grade I Tube type: Oral Tube size: 7.5 mm Number of attempts: 1 Airway Equipment and Method: Stylet Placement Confirmation: ETT inserted through vocal cords under direct vision,  positive ETCO2 and breath sounds checked- equal and bilateral Secured at: 22 cm Tube secured with: Tape Dental Injury: Teeth and Oropharynx as per pre-operative assessment

## 2017-05-19 NOTE — Evaluation (Signed)
Physical Therapy Evaluation and Discharge Patient Details Name: [name] MRN: [id number] DOB: 08/23/1945 Today's Date: 05/19/2017   History of Present Illness  Pt is a 72 y/o male who presents s/p L5-S1 decompression on 05/19/17. PMH significant for venous insufficiency, HTN, DVT, DM, clotting disorder, B TKA, T1-T2 disc surgery 2007, L ankle surgery 2009, cervical disc arthroplasty, L3-L5 decompression 2018.   Clinical Impression  Patient evaluated by Physical Therapy with no further acute PT needs identified. All education has been completed and the patient has no further questions. At the time of PT eval pt was able to perform transfers and ambulation with gross modified independence. Pt negotiated stairs with min guard assist for safety and SPC for support. Pt was educated on precautions, car transfer, activity progression, and general safety with mobility in the home environment. See below for any follow-up Physical Therapy or equipment needs. PT is signing off. Thank you for this referral.     Follow Up Recommendations No PT follow up;Supervision for mobility/OOB    Equipment Recommendations  None recommended by PT    Recommendations for Other Services       Precautions / Restrictions Precautions Precautions: Fall;Back Precaution Booklet Issued: Yes (comment) Precaution Comments: Reviewed handout in detail. Pt was cued for precautions throughout functional mobility.  Restrictions Weight Bearing Restrictions: No      Mobility  Bed Mobility Overal bed mobility: Modified Independent Bed Mobility: Rolling;Sidelying to Sit;Sit to Sidelying           General bed mobility comments: Pt demonstrated good log roll technique. VC's throughout. HOB slightly elevated and rails lowered to simulate home environment.   Transfers Overall transfer level: Modified independent Equipment used: Straight cane             General transfer comment: Pt demonstrated proper hand  placement on seated surface for safety. No assist required.   Ambulation/Gait Ambulation/Gait assistance: Modified independent (Device/Increase time) Ambulation Distance (Feet): 275 Feet Assistive device: Straight cane Gait Pattern/deviations: Step-through pattern;Decreased stride length;Trunk flexed Gait velocity: Decreased Gait velocity interpretation: 1.31 - 2.62 ft/sec, indicative of limited community ambulator General Gait Details: VC's for improved posture. Therapist managed IV pole and catheter bag, but pt was able to ambulate well with the [facility].   Stairs Stairs: Yes Stairs assistance: Min guard Stair Management: One rail Right;With cane;Step to pattern Number of Stairs: 3 General stair comments: VC's for sequencing and general safety.  Wheelchair Mobility    Modified Rankin (Stroke Patients Only)       Balance Overall balance assessment: Mild deficits observed, not formally tested                                           Pertinent Vitals/Pain Pain Assessment: Faces Faces Pain Scale: Hurts a little bit Pain Location: Incision Pain Descriptors / Indicators: Operative site guarding Pain Intervention(s): Monitored during session    Home Living Family/patient expects to be discharged to:: Private residence Living Arrangements: Spouse/significant other Available Help at Discharge: Family;Available 24 hours/day Type of Home: House Home Access: Stairs to enter Entrance Stairs-Rails: None Entrance Stairs-Number of Steps: 2 Home Layout: One level Home Equipment: Cane - single point;Shower seat;Adaptive equipment      Prior Function Level of Independence: Independent with assistive device(s)         Comments: SPC     Hand Dominance  Extremity/Trunk Assessment   Upper Extremity Assessment Upper Extremity Assessment: Defer to OT evaluation    Lower Extremity Assessment Lower Extremity Assessment: Generalized weakness     Cervical / Trunk Assessment Cervical / Trunk Assessment: Other exceptions Cervical / Trunk Exceptions: s/p surgery  Communication   Communication: No difficulties  Cognition Arousal/Alertness: Awake/alert Behavior During Therapy: WFL for tasks assessed/performed Overall Cognitive Status: Within Functional Limits for tasks assessed                                        General Comments      Exercises     Assessment/Plan    PT Assessment Patent does not need any further PT services  PT Problem List         PT Treatment Interventions      PT Goals (Current goals can be found in the Care Plan section)  Acute Rehab PT Goals Patient Stated Goal: Home tomorrow PT Goal Formulation: All assessment and education complete, DC therapy    Frequency     Barriers to discharge        Co-evaluation               AM-PAC PT "6 Clicks" Daily Activity  Outcome Measure Difficulty turning over in bed (including adjusting bedclothes, sheets and blankets)?: None Difficulty moving from lying on back to sitting on the side of the bed? : None Difficulty sitting down on and standing up from a chair with arms (e.g., wheelchair, bedside commode, etc,.)?: None Help needed moving to and from a bed to chair (including a wheelchair)?: None Help needed walking in hospital room?: None Help needed climbing 3-5 steps with a railing? : A Little 6 Click Score: 23    End of Session Equipment Utilized During Treatment: Gait belt Activity Tolerance: Patient tolerated treatment well Patient left: in bed;with call bell/phone within reach;with SCD's reapplied Nurse Communication: Mobility status PT Visit Diagnosis: Pain;Difficulty in walking, not elsewhere classified (R26.2) Pain - part of body: (back)    Time: 1330-1400 PT Time Calculation (min) (ACUTE ONLY): 30 min   Charges:   PT Evaluation $PT Eval Moderate Complexity: 1 Mod PT Treatments $Gait Training: 8-22 mins    PT G Codes:        Rolinda Roan, PT, DPT Acute Rehabilitation Services Pager: [place] 05/19/2017, 2:49 PM

## 2017-05-19 NOTE — Brief Op Note (Signed)
05/19/2017  9:55 AM  PATIENT:  [name]  72 y.o. male  PRE-OPERATIVE DIAGNOSIS:  Spinal Stenosis  POST-OPERATIVE DIAGNOSIS:  Spinal Stenosis  PROCEDURE:  Procedure(s): Microlumbar decompression Lumbar five-Sacral one (N/A)  SURGEON:  Surgeon(s) and Role:    Susa Day, MD - Primary  PHYSICIAN ASSISTANT:   ASSISTANTS: Bissell   ANESTHESIA:   general  EBL:  75 mL   BLOOD ADMINISTERED:none  DRAINS: none   LOCAL MEDICATIONS USED:  MARCAINE     SPECIMEN:  No Specimen  DISPOSITION OF SPECIMEN:  N/A  COUNTS:  YES  TOURNIQUET:  * No tourniquets in log *  DICTATION: .Other Dictation: Dictation Number [id number]  PLAN OF CARE: Admit for overnight observation  PATIENT DISPOSITION:  PACU - hemodynamically stable.   Delay start of Pharmacological VTE agent (>24hrs) due to surgical blood loss or risk of bleeding: yes

## 2017-05-19 NOTE — Anesthesia Postprocedure Evaluation (Signed)
Anesthesia Post Note  Patient: KADYN GUILD  Procedure(s) Performed: Microlumbar decompression Lumbar five-Sacral one (N/A Spine Lumbar)     Patient location during evaluation: PACU Anesthesia Type: General Level of consciousness: awake and alert Pain management: pain level controlled Vital Signs Assessment: post-procedure vital signs reviewed and stable Respiratory status: spontaneous breathing, nonlabored ventilation and respiratory function stable Cardiovascular status: blood pressure returned to baseline and stable Postop Assessment: no apparent nausea or vomiting Anesthetic complications: no    Last Vitals:  Vitals:   05/19/17 1100 05/19/17 1116  BP:  123/81  Pulse: 98 (!) 101  Resp: 18 18  Temp: 36.7 C 36.6 C  SpO2: 94% 95%    Last Pain:  Vitals:   05/19/17 1116  TempSrc: Oral  PainSc:                  Lynda Rainwater

## 2017-05-19 NOTE — Anesthesia Preprocedure Evaluation (Signed)
Anesthesia Evaluation  Patient identified by MRN, date of birth, ID band Patient awake    Reviewed: Allergy & Precautions, NPO status , Patient's Chart, lab work & pertinent test results  Airway Mallampati: II       Dental no notable dental hx.    Pulmonary asthma ,  Pt will get an Albuterol inhaler prior to OR.   Pulmonary exam normal breath sounds clear to auscultation       Cardiovascular hypertension, Pt. on medications Normal cardiovascular exam Rhythm:Regular Rate:Normal     Neuro/Psych PSYCHIATRIC DISORDERS Anxiety    GI/Hepatic GERD  Medicated and Controlled,  Endo/Other  diabetes, Type 2, Oral Hypoglycemic Agents  Renal/GU      Musculoskeletal  (+) Arthritis , Osteoarthritis,    Abdominal (+) + obese,   Peds  Hematology negative hematology ROS (+)   Anesthesia Other Findings   Reproductive/Obstetrics                             Anesthesia Physical  Anesthesia Plan  ASA: III  Anesthesia Plan: General   Post-op Pain Management:    Induction: Intravenous  PONV Risk Score and Plan: 2 and Ondansetron and Midazolam  Airway Management Planned: Oral ETT  Additional Equipment:   Intra-op Plan:   Post-operative Plan: Extubation in OR  Informed Consent: I have reviewed the patients History and Physical, chart, labs and discussed the procedure including the risks, benefits and alternatives for the proposed anesthesia with the patient or authorized representative who has indicated his/her understanding and acceptance.   Dental advisory given  Plan Discussed with: CRNA and Surgeon  Anesthesia Plan Comments:         Anesthesia Quick Evaluation

## 2017-05-19 NOTE — Interval H&P Note (Signed)
History and Physical Interval Note:  05/19/2017 7:16 AM  [name]  has presented today for surgery, with the diagnosis of Spinal Stenosis  The various methods of treatment have been discussed with the patient and family. After consideration of risks, benefits and other options for treatment, the patient has consented to  Procedure(s): Microlumbar decompression L5-S1, possible L4-L5 left (N/A) as a surgical intervention .  The patient's history has been reviewed, patient examined, no change in status, stable for surgery.  I have reviewed the patient's chart and labs.  Questions were answered to the patient's satisfaction.     , C

## 2017-05-19 NOTE — Transfer of Care (Signed)
Immediate Anesthesia Transfer of Care Note  Patient: [name]  Procedure(s) Performed: Microlumbar decompression Lumbar five-Sacral one (N/A Spine Lumbar)  Patient Location: PACU  Anesthesia Type:General  Level of Consciousness: awake, alert , oriented, patient cooperative and responds to stimulation  Airway & Oxygen Therapy: Patient Spontanous Breathing and Patient connected to face mask oxygen  Post-op Assessment: Report given to RN, Post -op Vital signs reviewed and stable and Patient moving all extremities X 4  Post vital signs: Reviewed and stable  Last Vitals:  Vitals Value Taken Time  BP 109/96 05/19/2017 10:06 AM  Temp    Pulse 101 05/19/2017 10:08 AM  Resp 23 05/19/2017 10:08 AM  SpO2 93 % 05/19/2017 10:08 AM  Vitals shown include unvalidated device data.  Last Pain:  Vitals:   05/19/17 0616  TempSrc: Oral  PainSc:          Complications: No apparent anesthesia complications

## 2017-05-19 NOTE — Discharge Instructions (Signed)

## 2017-05-20 ENCOUNTER — Encounter (HOSPITAL_COMMUNITY): Payer: Self-pay | Admitting: Specialist

## 2017-05-20 DIAGNOSIS — F419 Anxiety disorder, unspecified: Secondary | ICD-10-CM | POA: Diagnosis not present

## 2017-05-20 DIAGNOSIS — N312 Flaccid neuropathic bladder, not elsewhere classified: Secondary | ICD-10-CM | POA: Diagnosis not present

## 2017-05-20 DIAGNOSIS — M4807 Spinal stenosis, lumbosacral region: Secondary | ICD-10-CM | POA: Diagnosis not present

## 2017-05-20 DIAGNOSIS — I10 Essential (primary) hypertension: Secondary | ICD-10-CM | POA: Diagnosis not present

## 2017-05-20 DIAGNOSIS — R338 Other retention of urine: Secondary | ICD-10-CM | POA: Diagnosis not present

## 2017-05-20 DIAGNOSIS — M48061 Spinal stenosis, lumbar region without neurogenic claudication: Secondary | ICD-10-CM | POA: Diagnosis not present

## 2017-05-20 DIAGNOSIS — E785 Hyperlipidemia, unspecified: Secondary | ICD-10-CM | POA: Diagnosis not present

## 2017-05-20 DIAGNOSIS — M5116 Intervertebral disc disorders with radiculopathy, lumbar region: Secondary | ICD-10-CM | POA: Diagnosis not present

## 2017-05-20 LAB — BASIC METABOLIC PANEL
ANION GAP: 8 (ref 5–15)
BUN: 11 mg/dL (ref 6–20)
CALCIUM: 8.6 mg/dL — AB (ref 8.9–10.3)
CO2: 26 mmol/L (ref 22–32)
Chloride: 106 mmol/L (ref 101–111)
Creatinine, Ser: 1.12 mg/dL (ref 0.61–1.24)
GFR calc Af Amer: >60 mL/min (ref >60)
GLUCOSE: 146 mg/dL — AB (ref 65–99)
Potassium: 3.9 mmol/L (ref 3.5–5.1)
Sodium: 140 mmol/L (ref 135–145)

## 2017-05-20 LAB — GLUCOSE, CAPILLARY
Glucose-Capillary: 160 mg/dL — ABNORMAL HIGH (ref 65–99)
Glucose-Capillary: 101 mg/dL — ABNORMAL HIGH (ref 65–99)

## 2017-05-20 LAB — CBC
HCT: 42.4 % (ref 39.0–52.0)
Hemoglobin: 13.7 g/dL (ref 13.0–17.0)
MCH: 29.8 pg (ref 26.0–34.0)
MCHC: 32.3 g/dL (ref 30.0–36.0)
MCV: 92.2 fL (ref 78.0–100.0)
PLATELETS: 243 10*3/uL (ref 150–400)
RBC: 4.6 MIL/uL (ref 4.22–5.81)
RDW: 13 % (ref 11.5–15.5)
WBC: 11 10*3/uL — AB (ref 4.0–10.5)

## 2017-05-20 MED ORDER — GABAPENTIN 100 MG PO CAPS: 100.0000 mg | ORAL_CAPSULE | Freq: Every day | ORAL | 0 refills | Status: DC

## 2017-05-20 MED ORDER — TIZANIDINE HCL 2 MG PO TABS: 2.0000 mg | ORAL_TABLET | Freq: Every day | ORAL | 0 refills | Status: DC

## 2017-05-20 MED ORDER — ASPIRIN EC 81 MG PO TBEC: 81.0000 mg | DELAYED_RELEASE_TABLET | Freq: Every day | ORAL | Status: AC

## 2017-05-20 MED ORDER — POLYETHYLENE GLYCOL 3350 17 G PO PACK: 17.0000 g | PACK | Freq: Every day | ORAL | 0 refills | Status: DC | PRN

## 2017-05-20 MED ORDER — DICLOFENAC SODIUM 75 MG PO TBEC: DELAYED_RELEASE_TABLET | ORAL | 1 refills | Status: DC

## 2017-05-20 MED ORDER — OXYCODONE HCL 5 MG PO TABS: 5.0000 mg | ORAL_TABLET | ORAL | 0 refills | Status: DC | PRN

## 2017-05-20 MED ORDER — SIMVASTATIN 20 MG PO TABS: 40.0000 mg | ORAL_TABLET | Freq: Every day | ORAL | Status: DC

## 2017-05-20 MED ORDER — PANTOPRAZOLE SODIUM 40 MG PO TBEC
40.0000 mg | DELAYED_RELEASE_TABLET | Freq: Two times a day (BID) | ORAL | Status: DC
Start: 2017-05-20 — End: 2017-05-20
  Administered 2017-05-20: 40 mg via ORAL
  Filled 2017-05-20: qty 1

## 2017-05-20 MED ORDER — METFORMIN HCL 500 MG PO TABS
500.0000 mg | ORAL_TABLET | Freq: Two times a day (BID) | ORAL | Status: DC
  Administered 2017-05-20: 500 mg via ORAL
  Filled 2017-05-20: qty 1

## 2017-05-20 MED ORDER — GLIMEPIRIDE 2 MG PO TABS
2.0000 mg | ORAL_TABLET | Freq: Every day | ORAL | Status: DC
  Administered 2017-05-20: 2 mg via ORAL
  Filled 2017-05-20: qty 1

## 2017-05-20 MED ORDER — DOCUSATE SODIUM 100 MG PO CAPS: 100.0000 mg | ORAL_CAPSULE | Freq: Two times a day (BID) | ORAL | 1 refills | Status: DC | PRN

## 2017-05-20 MED FILL — Thrombin For Soln 20000 Unit: CUTANEOUS | Qty: 1 | Status: AC

## 2017-05-20 NOTE — Evaluation (Signed)
Occupational Therapy Evaluation and Discharge Summary Patient Details Name: [name] MRN: [id number] DOB: [birth year] Today's Date: 05/20/2017    History of Present Illness Pt is a 72 y/o male who presents s/p L5-S1 decompression on 05/19/17. PMH significant for venous insufficiency, HTN, DVT, DM, clotting disorder, B TKA, T1-T2 disc surgery 2007, L ankle surgery 2009, cervical disc arthroplasty, L3-L5 decompression 2018.    Clinical Impression   Pt admitted for the above surgery and overall is doing very well with adls requiring no physical assist.  Pt his having trouble emptying his bladder and will see urology for this post d/c.  Wife is home with pt at all times.  No further OT needs at this time.    Follow Up Recommendations  No OT follow up;Supervision - Intermittent    Equipment Recommendations  None recommended by OT    Recommendations for Other Services       Precautions / Restrictions Precautions Precautions: Fall;Back Precaution Booklet Issued: Yes (comment) Precaution Comments: Pt recalled 2/3 precautions Restrictions Weight Bearing Restrictions: No      Mobility Bed Mobility Overal bed mobility: Modified Independent Bed Mobility: Rolling;Sidelying to Sit;Sit to Sidelying           General bed mobility comments: Pt demonstrated good log roll technique. VC's throughout. HOB slightly elevated and rails lowered to simulate home environment.   Transfers Overall transfer level: Modified independent Equipment used: None             General transfer comment: Pt demonstrated proper hand placement on seated surface for safety. No assist required.     Balance Overall balance assessment: No apparent balance deficits (not formally assessed)                                         ADL either performed or assessed with clinical judgement   ADL Overall ADL's : Modified independent                                        General ADL Comments: Pt able to complete all basic adls without assist and is walking hallways Ily.  Pt has adaptive equipment at home and wife with him at all tmes.     Vision Baseline Vision/History: Wears glasses Wears Glasses: At all times Patient Visual Report: No change from baseline Vision Assessment?: No apparent visual deficits     Perception     Praxis      Pertinent Vitals/Pain Pain Assessment: No/denies pain     Hand Dominance Right   Extremity/Trunk Assessment Upper Extremity Assessment Upper Extremity Assessment: Overall WFL for tasks assessed   Lower Extremity Assessment Lower Extremity Assessment: Defer to PT evaluation   Cervical / Trunk Assessment Cervical / Trunk Assessment: Other exceptions Cervical / Trunk Exceptions: s/p surgery   Communication Communication Communication: No difficulties   Cognition Arousal/Alertness: Awake/alert Behavior During Therapy: WFL for tasks assessed/performed Overall Cognitive Status: Within Functional Limits for tasks assessed                                     General Comments  Pt  having trouble emptying bladdar and is going to urologist as soon as he leaves hospital  Exercises     Shoulder Instructions      Home Living Family/patient expects to be discharged to:: Private residence Living Arrangements: Spouse/significant other Available Help at Discharge: Family;Available 24 hours/day Type of Home: House Home Access: Stairs to enter [employer] of Steps: 2 Entrance Stairs-Rails: None Home Layout: One level     Bathroom Shower/Tub: Occupational psychologist: Handicapped height     Home Equipment: Cane - single point;Shower seat;Adaptive equipment Adaptive Equipment: Reacher        Prior Functioning/Environment Level of Independence: Independent with assistive device(s)        Comments: SPC        OT Problem List:        OT Treatment/Interventions:       OT Goals(Current goals can be found in the care plan section) Acute Rehab OT Goals Patient Stated Goal: return home with less pain and more strength. OT Goal Formulation: All assessment and education complete, DC therapy  OT Frequency:     Barriers to D/C:            Co-evaluation              AM-PAC PT "6 Clicks" Daily Activity     Outcome Measure Help from another person eating meals?: None Help from another person taking care of personal grooming?: None Help from another person toileting, which includes using toliet, bedpan, or urinal?: None Help from another person bathing (including washing, rinsing, drying)?: None Help from another person to put on and taking off regular upper body clothing?: None Help from another person to put on and taking off regular lower body clothing?: None 6 Click Score: 24   End of Session Nurse Communication: Mobility status  Activity Tolerance: Patient tolerated treatment well Patient left: Other (comment)(walking in hallway)  OT Visit Diagnosis: Unsteadiness on feet (R26.81)                Time: 2620-3559 OT Time Calculation (min): 11 min Charges:  OT General Charges $OT Visit: 1 Visit OT Evaluation $OT Eval Low Complexity: 1 Low G-Codes:     Jinger Neighbors, OTR/L [phone]  Glenford Peers 05/20/2017, 10:22 AM

## 2017-05-20 NOTE — Progress Notes (Signed)
Subjective: 1 Day Post-Op Procedure(s) (LRB): Microlumbar decompression Lumbar five-Sacral one (N/A) Patient reports pain as mild.   Pain well controlled. No leg pain. No numbness or tingling. Urinary retention yesterday, foley reinserted, not to take flomax per urologist Hx of urinary retention post-op previously No other c/o.  Objective: Vital signs in last 24 hours: Temp:  [97.8 F (36.6 C)-99.1 F (37.3 C)] 98.5 F (36.9 C) (05/17 0717) Pulse Rate:  [94-105] 97 (05/17 0720) Resp:  [16-24] 18 (05/17 0717) BP: (109-158)/(63-113) 154/90 (05/17 0720) SpO2:  [92 %-97 %] 94 % (05/17 0717)  Intake/Output from previous day: 05/16 0701 - 05/17 0700 In: 1280 [P.O.:80; I.V.:950; IV Piggyback:250] Out: 2865 [Urine:2590; Drains:200; Blood:75] Intake/Output this shift: No intake/output data recorded.  Recent Labs    05/20/17 0709  HGB 13.7   Recent Labs    05/20/17 0709  WBC 11.0*  RBC 4.60  HCT 42.4  PLT 243   Recent Labs    05/20/17 0709  NA 140  K 3.9  CL 106  CO2 26  BUN 11  CREATININE 1.12  GLUCOSE 146*  CALCIUM 8.6*   No results for input(s): LABPT, INR in the last 72 hours.  Neurologically intact ABD soft Neurovascular intact Sensation intact distally Intact pulses distally Dorsiflexion/Plantar flexion intact Incision: dressing C/D/I and no drainage No cellulitis present Compartment soft no calf pain or sign of DVT hemovac drain pulled, tip intact  Assessment/Plan: 1 Day Post-Op Procedure(s) (LRB): Microlumbar decompression Lumbar five-Sacral one (N/A) Advance diet Up with therapy D/C IV fluids Will D/C foley after 9AM, has outpt appt with urology at 3pm so that would provide a 6 hr window. They can scan and reinsert foley at urology office if needed Dr. Tonita Cong discussed with pt, wife, and urology today Will change dressing today prior to D/C Discussed plan with Dr. Tonita Cong and nurses on floor   [place],  M. 05/20/2017, 8:41 AM

## 2017-05-20 NOTE — Progress Notes (Signed)
Patient alert and oriented, mae's well, voiding adequate amount of urine, swallowing without difficulty, no c/o pain at time of discharge. Patient discharged home with family. Script and discharged instructions given to patient. Patient and family stated understanding of instructions given. Patient has an appointment with Dr. Tonita Cong in two weeks and with [facility] on 05/20/17 at 3pm

## 2017-05-20 NOTE — Discharge Summary (Signed)
Physician Discharge Summary   Patient ID: [name] MRN: [id number] DOB/AGE: [birth year] 72 y.o.  Admit date: 05/19/2017 Discharge date: 05/20/2017  Primary Diagnosis:   Spinal Stenosis  Admission Diagnoses:  Past Medical History:  Diagnosis Date  . Anxiety   . BPH (benign prostatic hypertrophy) with urinary obstruction   . Cholelithiasis   . Clotting disorder ([place])   . Complication of anesthesia    urinary retention ; "bladder didnt wake up"; says last gallbladder surgery he shouldve been cathed with his med hx  . Diabetes mellitus    Type II  . Diverticulosis of colon   . DJD (degenerative joint disease)   . GERD (gastroesophageal reflux disease)   . Hemorrhoids   . History of colonic polyps   . History of DVT (deep vein thrombosis)   . History of kidney stones   . Hyperlipidemia   . Hypertension, benign   . Low back pain syndrome   . Nephrolithiasis   . pulmonary nodule, left lower lobe   . Spinal stenosis   . Varicose veins of lower extremities   . Venous insufficiency    Discharge Diagnoses:   Principal Problem:   Spinal stenosis of lumbar region Active Problems:   HNP (herniated nucleus pulposus), lumbar  Procedure:  Procedure(s) (LRB): Microlumbar decompression Lumbar five-Sacral one (N/A)   Consults: urology  HPI:  see h&p    Laboratory Data: Hospital Outpatient Visit on 05/13/2017  Component Date Value Ref Range Status  . Glucose-Capillary 05/13/2017 138* 65 - 99 mg/dL Final  . Sodium 05/13/2017 140  135 - 145 mmol/L Final  . Potassium 05/13/2017 4.6  3.5 - 5.1 mmol/L Final  . Chloride 05/13/2017 107  101 - 111 mmol/L Final  . CO2 05/13/2017 24  22 - 32 mmol/L Final  . Glucose, Bld 05/13/2017 133* 65 - 99 mg/dL Final  . BUN 05/13/2017 13  6 - 20 mg/dL Final  . Creatinine, Ser 05/13/2017 1.05  0.61 - 1.24 mg/dL Final  . Calcium 05/13/2017 9.0  8.9 - 10.3 mg/dL Final  . GFR calc non Af Amer 05/13/2017 >60  >60 mL/min Final  . GFR calc Af  Amer 05/13/2017 >60  >60 mL/min Final   Comment: (NOTE) The eGFR has been calculated using the CKD EPI equation. This calculation has not been validated in all clinical situations. eGFR's persistently <60 mL/min signify possible Chronic Kidney Disease.   Georgiann Hahn gap 05/13/2017 9  5 - 15 Final   Performed at [facility], [place] [address], [place], [place] [address]  . WBC 05/13/2017 6.8  4.0 - 10.5 K/uL Final  . RBC 05/13/2017 5.12  4.22 - 5.81 MIL/uL Final  . Hemoglobin 05/13/2017 15.6  13.0 - 17.0 g/dL Final  . HCT 05/13/2017 48.3  39.0 - 52.0 % Final  . MCV 05/13/2017 94.3  78.0 - 100.0 fL Final  . MCH 05/13/2017 30.5  26.0 - 34.0 pg Final  . MCHC 05/13/2017 32.3  30.0 - 36.0 g/dL Final  . RDW 05/13/2017 13.7  11.5 - 15.5 % Final  . Platelets 05/13/2017 275  150 - 400 K/uL Final   Performed at [facility], [place] [address] [place], [place] [address]  . Hgb A1c MFr Bld 05/13/2017 6.1* 4.8 - 5.6 % Final   Comment: (NOTE) Pre diabetes:          5.7%-6.4% Diabetes:              >6.4% Glycemic control for   <  7.0% adults with diabetes   . Mean Plasma Glucose 05/13/2017 128.37  mg/dL Final   Performed at [facility] [address] [place], [place] [address]  . MRSA, PCR 05/13/2017 NEGATIVE  NEGATIVE Final  . Staphylococcus aureus 05/13/2017 POSITIVE* NEGATIVE Final   Comment: (NOTE) The Xpert SA Assay (FDA approved for NASAL specimens in patients 27 years of age and older), is one component of a comprehensive surveillance program. It is not intended to diagnose infection nor to guide or monitor treatment.    Recent Labs    05/20/17 0709  HGB 13.7   Recent Labs    05/20/17 0709  WBC 11.0*  RBC 4.60  HCT 42.4  PLT 243   Recent Labs    05/20/17 0709  NA 140  K 3.9  CL 106  CO2 26  BUN 11  CREATININE 1.12  GLUCOSE 146*  CALCIUM 8.6*   No results for input(s): LABPT, INR in the last 72 hours.  X-Rays:Dg Lumbar Spine 2-3 Views  Result Date:  05/19/2017 CLINICAL DATA:  Microlumbar decompression EXAM: LUMBAR SPINE - 2-3 VIEW COMPARISON:  None. FINDINGS: Cross-table lateral lumbar image labeled #1 submitted. Metallic probe tips are posterior to the L5-S1 interspace level in the inferior aspect of the S2 vertebral body respectively. No fracture or spondylolisthesis. Cross-table lateral lumbar image labeled #2 submitted. Metallic probe tip is posterior to the L5-S1 interspace level. No fracture or spondylolisthesis. There is disc space narrowing with vacuum phenomenon at L3-4, L4-5, and L5-S1. Cross-table lateral lumbar image labeled #3 submitted. Metallic probe tips are posterior to the inferior aspect of the L5 vertebral body in the mid S1 vertebral body respectively. No fracture or spondylolisthesis. Disc space narrowing at L3-4, L4-5, and L5-S1 again noted. IMPRESSION: On the final submitted cross-table lateral image, metallic probe tips are posterior to the inferior aspect of the L5 vertebral body and midportion of the S1 vertebral body. Cutting tool overlies the L5 spinous process. No fracture or spondylolisthesis. There is disc space narrowing at L3-4, L4-5, and L5-S1. Electronically Signed   By: Lowella Grip III M.D.   On: 05/19/2017 10:33   Dg Lumbar Spine 2-3 Views  Result Date: 05/13/2017 CLINICAL DATA:  Preoperative study for lower back surgery. EXAM: LUMBAR SPINE - 2-3 VIEW COMPARISON:  Lumbar spine x-rays dated May 12, 2016. FINDINGS: Five lumbar type vertebral bodies. Interval posterior decompression at L4-L5 and L5-S1. No acute fracture or subluxation. Vertebral body heights are preserved. Unchanged 4 mm retrolisthesis at L3-L4. Moderate disc height loss from L3-L4 through L5-S1, similar to prior study. Prominent anterior osteophytes throughout the lumbar spine. Lower lobe left facet arthropathy. The sacroiliac joints are unremarkable. IMPRESSION: Moderate degenerative changes of the lower lumbar spine, similar to prior study.  Electronically Signed   By: Titus Dubin M.D.   On: 05/13/2017 15:05    EKG: Orders placed or performed in visit on 04/06/17  . EKG 12-Lead     Hospital Course: Patient was admitted to [facility] and taken to the OR and underwent the above state procedure without complications.  Patient tolerated the procedure well and was later transferred to the recovery room and then to the orthopaedic floor for postoperative care.  They were given PO and IV analgesics for pain control following their surgery.  They were given 24 hours of postoperative antibiotics.   PT was consulted postop to assist with mobility and transfers.  The patient was allowed to be WBAT with therapy and was  taught back precautions. Discharge planning was consulted to help with postop disposition and equipment needs.  Patient had a fair night on the evening of surgery and started to get up OOB with therapy on day one.  He did experience post-op urinary retention which he has a history of. His foley was reinserted overnight and urology will see him as an outpatient, foley will be removed prior to d/c. Patient was seen in rounds and was ready to go home on day one.  They were given discharge instructions and dressing directions.  They were instructed on when to follow up in the office with Dr. Tonita Cong.   Diet: Diabetic diet Activity:WBAT, Lspine precautions Follow-up:in 10-14 days Disposition - Home Discharged Condition: good    Allergies as of 05/20/2017      Reactions   Ciprofloxacin Rash   Niacin Other (See Comments)   REACTION: hypotension, shaky      Medication List    STOP taking these medications   acetaminophen 500 MG tablet Commonly known as:  TYLENOL     TAKE these medications   alfuzosin 10 MG 24 hr tablet Commonly known as:  UROXATRAL Take 10 mg by mouth daily with breakfast.   aspirin EC 81 MG tablet Take 1 tablet (81 mg total) by mouth daily. Resume 4 days post-op What changed:  additional  instructions   cetirizine 10 MG tablet Commonly known as:  ZYRTEC Take 10 mg by mouth daily.   diclofenac 75 MG EC tablet Commonly known as:  VOLTAREN TAKE 1 TABLET BY MOUTH TWICE A DAY WITH A MEAL Resume 5 days post-op as needed What changed:  See the new instructions.   docusate sodium 100 MG capsule Commonly known as:  COLACE Take 1 capsule (100 mg total) by mouth 2 (two) times daily as needed for mild constipation. What changed:    when to take this  reasons to take this   gabapentin 100 MG capsule Commonly known as:  NEURONTIN Take 1 capsule (100 mg total) by mouth at bedtime. As needed What changed:  additional instructions   glimepiride 2 MG tablet Commonly known as:  AMARYL TAKE 1 TABLET BY MOUTH DAILY BEFORE BREAKFAST What changed:    how much to take  how to take this  when to take this  additional instructions   losartan 100 MG tablet Commonly known as:  COZAAR Take 1 tablet (100 mg total) by mouth daily.   metFORMIN 500 MG tablet Commonly known as:  GLUCOPHAGE TAKE 1 TABLET BY MOUTH TWICE A DAY WITH A MEAL   multivitamin per tablet Take 1 tablet by mouth daily.   oxyCODONE 5 MG immediate release tablet Commonly known as:  Oxy IR/ROXICODONE Take 1-2 tablets (5-10 mg total) by mouth every 3 (three) hours as needed for moderate pain ((score 4 to 6)).   pantoprazole 40 MG tablet Commonly known as:  PROTONIX TAKE 1 TABLET (40 MG TOTAL) BY MOUTH 2 (TWO) TIMES DAILY.   polyethylene glycol packet Commonly known as:  MIRALAX / GLYCOLAX Take 17 g by mouth daily as needed for mild constipation.   ranitidine 300 MG tablet Commonly known as:  ZANTAC Take 1 tablet (300 mg total) by mouth at bedtime.   RAPAFLO 8 MG Caps capsule Generic drug:  silodosin Take 8 mg by mouth daily.   simvastatin 40 MG tablet Commonly known as:  ZOCOR TAKE 1 TABLET (40 MG TOTAL) BY MOUTH AT BEDTIME.   tiZANidine 2 MG tablet Commonly known as:  ZANAFLEX Take 1 tablet  (2 mg total) by mouth at bedtime. As needed What changed:    how much to take  additional instructions      Follow-up Information    Susa Day, MD Follow up in 2 week(s).   Specialty:  Orthopedic Surgery Contact information: [address] [place] [address] [phone]           Signed: Lacie Draft, PA-C Orthopaedic Surgery 05/20/2017, 8:48 AM

## 2017-05-20 NOTE — Progress Notes (Signed)
Patient voided amount of 114ml of clear yellow urine and PVR of 6ml noted on bladder scan. Will continue to monitor.

## 2017-05-20 NOTE — Op Note (Signed)
NAME: [name], GAHM MEDICAL RECORD [id number] ACCOUNT [id number] DATE OF BIRTH:06/02/1945 FACILITY: [facility] LOCATION: [facility] PHYSICIAN: Windy Kalata, MD  OPERATIVE REPORT  DATE OF PROCEDURE:  05/19/2017  PREOPERATIVE DIAGNOSIS:  Spinal stenosis at L4-L5, recurrent spinal stenosis L5-S1, L4-L5.  POSTOPERATIVE DIAGNOSES:  Spinal stenosis at L4-L5, recurrent spinal stenosis L5-S1, L4-L5.  PROCEDURE PERFORMED:  Revision central lumbar decompression at L5-S1 with bilateral lumbar decompressions, foraminotomy at L5 and S1, left.  ANESTHESIA:  General.  ASSISTANT:  Lacie Draft, PA  HISTORY:  A 72 year old male with history of lumbar decompression L3-L4 and L4-L5 and partial resection of the L5 lamina.  He did well from L3-L4 radicular pain.  Developed 1 year later S1 radicular pain on the left.  He had temporary relief from a  selective nerve root block.  He had plantar flexion, EHL weakness on the left.  MRI indicating facet hypertrophy and osteophyte displacing the S1 nerve root.  We discussed living with his symptoms versus revision microlumbar decompression.  Due to  negative effect of his activities of daily living, he is indicated for that procedure.  Risks and benefits were discussed including bleeding, infection, damage to neurovascular structure, no change in symptoms, worsening symptoms, DVT, PE, anesthetic  complications, etc.  DESCRIPTION OF PROCEDURE:  The patient in supine position.  After induction of adequate anesthesia, Kefzol and clindamycin, he was placed prone on the Wilson frame, all bony prominences well padded, Foley to gravity.  Lumbar region was prepped and draped  in the usual sterile fashion.  Two 18-gauge spinal needle was utilized to localize 5-1 interspace.  This was over the previous surgical incision.  Incision was made in the previous surgical incision, extending it caudad and cephalad.  Subcutaneous  tissue was dissected.  Electrocautery was utilized to  achieve hemostasis.  Scar tissue was noted.  Identified the spinous process of S1 and the residual spinous process of 5.  I briefly skeletonized S1.  I placed a Kocher on both of these, which was  confirmed with x-ray.  Due to the facet hypertrophy and slight scoliosis there was slight obscurity.  I then through a small interlaminar window elevated the paraspinous musculature bilaterally.  Scar tissue was noted predominantly around the L5 lamina  from the previous resection.  This was then mobilized and I was able to identify the residual lamina of 5 bilaterally.  McCullough retractor was placed.  Operating microscope was draped and brought into the surgical field.  A very single and collapsed  facet on the left with a very small interlaminar window.  We used a lamina spreader on the contralateral side for slight distraction.  I removed a portion of the spinous process of 5.  Also, of S1.  Bone wax was placed on the cancellous surfaces.  I used  an osteotome and did remove a portion of the inferior articulating process of 5.  A straight curette was utilized to detach the ligamentum flavum from the cephalad edge of S1 and from the caudad edge of 5.  Scar tissue was noted cephalad.  I removed the  inferior portion and a partial medial facetectomy of 5 was performed on the left, first of the inferior articulating process and then identifying the superior articulating process.  After detaching ligamentum flavum from the cephalad edge of S1, we  performed hemilaminotomies of S1 and a foraminotomy of S1.  There was an enlarged superior articulating process of S1, fairly significant, extending down into the S1 nerve root and cephalad.  I  developed a plane between the thecal sac and S1 nerve root,  gently mobilizing the nerve root and protecting it.  Using FA2 micro curette to develop this plane, I partially removed the superior articulating process with its infold to the ligamentum flavum.  Fairly enlarged epidural  veins were noted as well after  gently removing the superior articulating process from its medial projection.  We decompressed the lateral recess to the medial border of the pedicle.  Undercutting the hemilamina of 5 cephalad as well.  Bipolar electrocautery was utilized to achieve  hemostasis after protecting the nerve root of these large epidural veins.  The disk was noted to be hardened.  After decompressing the lateral recess of the medial border of the pedicle, a neuro probe passed freely out the foramen of 5 and of S1.  There  was 1 cm of excursion of the S1 nerve root medial to the pedicle without tension.  The ligamentum that was removed from the interspace at L5-S1 was detached along the inferior lamina on the contralateral side and a partial hemilaminotomy on the right was  performed as well.  Left the ligamentum flavum over the midline for protection against epidural fibrosis.  A Woodson retractor placed above the pedicle of 5 and below the pedicle of S1.  No active bleeding or CSF leakage.  Confirmatory radiograph  obtained.  Strict hemostasis was achieved with bipolar cautery.  One very small portion of Gelfoam was placed in the lateral recess over the cauterized epidural vein.  I removed the McCullough retractor. Paraspinous muscle was irrigated and cauterized.   I closed the dorsal lumbar fascia distally with #1 Vicryl interrupted figure-of-eight sutures.  Hemovac was placed and was brought out through a stab wound in the skin.  It was placed centrally and minimally charged after closure of the fascia.  There  was a fair amount of scar tissue over the fascia near the residual spinous process of 5.   ____ the Hemovac following the closure of the fascia in the subcu with 2-0 and skin with staples.  Wound was dressed sterilely, placed gently supine on the  hospital bed, extubated without difficulty and transported to the recovery room in satisfactory condition.  The patient tolerated the  procedure well.  No complications.  Minimal blood loss, 75 mL.     GN/NUANCE  D:05/19/2017 T:05/19/2017 JOB:[id number]

## 2017-05-24 ENCOUNTER — Other Ambulatory Visit: Payer: Self-pay | Admitting: Family Medicine

## 2017-07-05 ENCOUNTER — Other Ambulatory Visit: Payer: Self-pay | Admitting: Family Medicine

## 2017-07-11 ENCOUNTER — Telehealth: Payer: Self-pay

## 2017-07-11 NOTE — Telephone Encounter (Signed)
Called patient and let him know that I had seen the last time he came in for his diabetes was on 01/25/17 and I did not see that a different doctor, Dr. Tonita Cong had checked his Hemoglobin A1c at the hospital for his surgery. I advised to the patient that his appointment should be after August 10th or September to recheck his diabetes. I will send a correction to the pharmacy. Patient verbalized an understanding.  Copied from [place] [phone]. Topic: Inquiry >> Jul 11, 2017 12:32 PM [name], NT wrote: Reason for CRM: Patient calling to see why he needs to make an appointment to get further metformin refills. States that he just had his A1C drawn in May before his surgery. Please advise.  >> Jul 11, 2017  1:49 PM Dillard, Jerelene Redden, CMA wrote: Routing to CMA that filled medication to see why this was added to his RX.

## 2017-07-20 ENCOUNTER — Other Ambulatory Visit: Payer: Self-pay | Admitting: Family Medicine

## 2017-07-27 ENCOUNTER — Other Ambulatory Visit: Payer: Self-pay | Admitting: Family Medicine

## 2017-08-01 ENCOUNTER — Other Ambulatory Visit: Payer: Self-pay | Admitting: Family Medicine

## 2017-08-10 DIAGNOSIS — E119 Type 2 diabetes mellitus without complications: Secondary | ICD-10-CM | POA: Diagnosis not present

## 2017-08-10 LAB — HM DIABETES EYE EXAM

## 2017-08-11 DIAGNOSIS — D2372 Other benign neoplasm of skin of left lower limb, including hip: Secondary | ICD-10-CM | POA: Diagnosis not present

## 2017-08-11 DIAGNOSIS — D225 Melanocytic nevi of trunk: Secondary | ICD-10-CM | POA: Diagnosis not present

## 2017-08-11 DIAGNOSIS — L814 Other melanin hyperpigmentation: Secondary | ICD-10-CM | POA: Diagnosis not present

## 2017-08-11 DIAGNOSIS — D2371 Other benign neoplasm of skin of right lower limb, including hip: Secondary | ICD-10-CM | POA: Diagnosis not present

## 2017-08-11 DIAGNOSIS — L720 Epidermal cyst: Secondary | ICD-10-CM | POA: Diagnosis not present

## 2017-08-11 DIAGNOSIS — L821 Other seborrheic keratosis: Secondary | ICD-10-CM | POA: Diagnosis not present

## 2017-08-11 DIAGNOSIS — D1801 Hemangioma of skin and subcutaneous tissue: Secondary | ICD-10-CM | POA: Diagnosis not present

## 2017-08-11 DIAGNOSIS — L918 Other hypertrophic disorders of the skin: Secondary | ICD-10-CM | POA: Diagnosis not present

## 2017-08-12 LAB — HM DIABETES EYE EXAM

## 2017-08-15 ENCOUNTER — Other Ambulatory Visit: Payer: Self-pay | Admitting: General Practice

## 2017-08-15 MED ORDER — METFORMIN HCL 500 MG PO TABS: 500.0000 mg | ORAL_TABLET | Freq: Two times a day (BID) | ORAL | 1 refills | Status: DC

## 2017-08-19 ENCOUNTER — Other Ambulatory Visit: Payer: Self-pay

## 2017-08-19 ENCOUNTER — Encounter: Payer: Self-pay | Admitting: Family Medicine

## 2017-08-19 ENCOUNTER — Ambulatory Visit (INDEPENDENT_AMBULATORY_CARE_PROVIDER_SITE_OTHER): Payer: Medicare Other | Admitting: Family Medicine

## 2017-08-19 VITALS — BP 131/81 | HR 67 | Temp 98.1°F | Resp 16 | Ht 70.0 in | Wt 245.0 lb

## 2017-08-19 DIAGNOSIS — E119 Type 2 diabetes mellitus without complications: Secondary | ICD-10-CM

## 2017-08-19 DIAGNOSIS — E785 Hyperlipidemia, unspecified: Secondary | ICD-10-CM

## 2017-08-19 DIAGNOSIS — I10 Essential (primary) hypertension: Secondary | ICD-10-CM | POA: Diagnosis not present

## 2017-08-19 LAB — HEPATIC FUNCTION PANEL
ALBUMIN: 4.2 g/dL (ref 3.5–5.2)
ALT: 30 U/L (ref 0–53)
AST: 21 U/L (ref 0–37)
Alkaline Phosphatase: 46 U/L (ref 39–117)
BILIRUBIN DIRECT: 0.1 mg/dL (ref 0.0–0.3)
TOTAL PROTEIN: 6.5 g/dL (ref 6.0–8.3)
Total Bilirubin: 0.6 mg/dL (ref 0.2–1.2)

## 2017-08-19 LAB — CBC WITH DIFFERENTIAL/PLATELET
BASOS ABS: 0 10*3/uL (ref 0.0–0.1)
Basophils Relative: 0.2 % (ref 0.0–3.0)
EOS ABS: 0.3 10*3/uL (ref 0.0–0.7)
Eosinophils Relative: 3.7 % (ref 0.0–5.0)
HCT: 44.9 % (ref 39.0–52.0)
Hemoglobin: 14.6 g/dL (ref 13.0–17.0)
Lymphocytes Relative: 25 % (ref 12.0–46.0)
Lymphs Abs: 1.7 10*3/uL (ref 0.7–4.0)
MCHC: 32.6 g/dL (ref 30.0–36.0)
MCV: 88.1 fl (ref 78.0–100.0)
Monocytes Absolute: 0.7 10*3/uL (ref 0.1–1.0)
Monocytes Relative: 10.9 % (ref 3.0–12.0)
NEUTROS ABS: 4.1 10*3/uL (ref 1.4–7.7)
NEUTROS PCT: 60.2 % (ref 43.0–77.0)
Platelets: 259 10*3/uL (ref 150.0–400.0)
RBC: 5.1 Mil/uL (ref 4.22–5.81)
RDW: 14 % (ref 11.5–15.5)
WBC: 6.9 10*3/uL (ref 4.0–10.5)

## 2017-08-19 LAB — HEMOGLOBIN A1C: Hgb A1c MFr Bld: 6.3 % (ref 4.6–6.5)

## 2017-08-19 LAB — LIPID PANEL
Cholesterol: 103 mg/dL (ref 0–200)
HDL: 36.9 mg/dL — ABNORMAL LOW (ref >39.00)
LDL CALC: 45 mg/dL (ref 0–99)
NonHDL: 66.4
TRIGLYCERIDES: 107 mg/dL (ref 0.0–149.0)
Total CHOL/HDL Ratio: 3
VLDL: 21.4 mg/dL (ref 0.0–40.0)

## 2017-08-19 LAB — BASIC METABOLIC PANEL
BUN: 17 mg/dL (ref 6–23)
CO2: 27 meq/L (ref 19–32)
Calcium: 9.5 mg/dL (ref 8.4–10.5)
Chloride: 107 mEq/L (ref 96–112)
Creatinine, Ser: 1.1 mg/dL (ref 0.40–1.50)
GFR: 69.81 mL/min (ref >60.00)
GLUCOSE: 111 mg/dL — AB (ref 70–99)
Potassium: 4.4 mEq/L (ref 3.5–5.1)
SODIUM: 141 meq/L (ref 135–145)

## 2017-08-19 LAB — TSH: TSH: 1.12 u[IU]/mL (ref 0.35–4.50)

## 2017-08-19 NOTE — Progress Notes (Signed)
   Subjective:    Patient ID: [name], male    DOB: 01-Nov-1945, 72 y.o.   MRN: [id number]  HPI DM- chronic problem, on Amaryl 2mg  daily, Metformin 500mg  BID.  Due for foot exam.  UTD on eye exam.  On ARB for renal protection.  Denies symptomatic lows.  No numbness/tingling of hands, chronic tingling of feet from back issues.    Hyperlipidemia- chronic problem, on Simvastatin 40mg  daily.  Denies abd pain, N/V.  HTN- chronic problem, on Losartan 100mg  daily, adequate control.  No CP, SOB, HAs, visual changes, edema.   Review of Systems For ROS see HPI     Objective:   Physical Exam  Constitutional: He is oriented to person, place, and time. He appears well-developed and well-nourished. No distress.  obese  HENT:  Head: Normocephalic and atraumatic.  Eyes: Pupils are equal, round, and reactive to light. Conjunctivae and EOM are normal.  Neck: Normal range of motion. Neck supple. No thyromegaly present.  Cardiovascular: Normal rate, regular rhythm, normal heart sounds and intact distal pulses.  No murmur heard. Pulmonary/Chest: Effort normal and breath sounds normal. No respiratory distress.  Abdominal: Soft. Bowel sounds are normal. He exhibits no distension.  Musculoskeletal: He exhibits no edema.  Lymphadenopathy:    He has no cervical adenopathy.  Neurological: He is alert and oriented to person, place, and time. No cranial nerve deficit.  Skin: Skin is warm and dry.  Psychiatric: He has a normal mood and affect. His behavior is normal.  Vitals reviewed.         Assessment & Plan:

## 2017-08-19 NOTE — Assessment & Plan Note (Signed)
Chronic problem.  Tolerating statin w/o difficulty.  Check labs.  Adjust meds prn  

## 2017-08-19 NOTE — Assessment & Plan Note (Signed)
Chronic problem.  Adequate control.  Asymptomatic.  Check labs.  No anticipated med changes.  Will follow. 

## 2017-08-19 NOTE — Patient Instructions (Signed)
Follow up in 3-4 months to recheck diabetes We'll notify you of your lab results and make any changes if needed Continue to work on healthy diet and regular exercise- you can do it! Call with any questions or concerns Enjoy the rest of your summer!

## 2017-08-19 NOTE — Assessment & Plan Note (Signed)
Chronic problem.  Hx of adequate control.  UTD on eye exam, on ARB.  Foot exam done today.  Stressed need for healthy diet and regular exercise.  Check labs.  Adjust meds prn

## 2017-08-22 ENCOUNTER — Encounter: Payer: Self-pay | Admitting: General Practice

## 2017-08-30 ENCOUNTER — Ambulatory Visit (INDEPENDENT_AMBULATORY_CARE_PROVIDER_SITE_OTHER): Payer: Medicare Other

## 2017-08-30 ENCOUNTER — Encounter: Payer: Self-pay | Admitting: General Practice

## 2017-08-30 DIAGNOSIS — Z23 Encounter for immunization: Secondary | ICD-10-CM

## 2017-10-03 ENCOUNTER — Other Ambulatory Visit: Payer: Self-pay | Admitting: Family Medicine

## 2017-10-13 ENCOUNTER — Other Ambulatory Visit: Payer: Self-pay | Admitting: Family Medicine

## 2017-11-11 DIAGNOSIS — R972 Elevated prostate specific antigen [PSA]: Secondary | ICD-10-CM | POA: Diagnosis not present

## 2017-11-16 DIAGNOSIS — R351 Nocturia: Secondary | ICD-10-CM | POA: Diagnosis not present

## 2017-11-16 DIAGNOSIS — R972 Elevated prostate specific antigen [PSA]: Secondary | ICD-10-CM | POA: Diagnosis not present

## 2017-11-16 DIAGNOSIS — N5201 Erectile dysfunction due to arterial insufficiency: Secondary | ICD-10-CM | POA: Diagnosis not present

## 2017-11-16 DIAGNOSIS — N401 Enlarged prostate with lower urinary tract symptoms: Secondary | ICD-10-CM | POA: Diagnosis not present

## 2017-11-16 DIAGNOSIS — N200 Calculus of kidney: Secondary | ICD-10-CM | POA: Diagnosis not present

## 2017-12-12 ENCOUNTER — Encounter: Payer: Self-pay | Admitting: General Practice

## 2017-12-12 ENCOUNTER — Ambulatory Visit (INDEPENDENT_AMBULATORY_CARE_PROVIDER_SITE_OTHER): Payer: Medicare Other | Admitting: Family Medicine

## 2017-12-12 ENCOUNTER — Encounter: Payer: Self-pay | Admitting: Family Medicine

## 2017-12-12 ENCOUNTER — Other Ambulatory Visit: Payer: Self-pay

## 2017-12-12 VITALS — BP 132/80 | HR 81 | Temp 98.1°F | Resp 16 | Ht 69.0 in | Wt 246.5 lb

## 2017-12-12 DIAGNOSIS — E119 Type 2 diabetes mellitus without complications: Secondary | ICD-10-CM | POA: Diagnosis not present

## 2017-12-12 DIAGNOSIS — H61891 Other specified disorders of right external ear: Secondary | ICD-10-CM | POA: Diagnosis not present

## 2017-12-12 LAB — BASIC METABOLIC PANEL
BUN: 23 mg/dL (ref 6–23)
CHLORIDE: 107 meq/L (ref 96–112)
CO2: 22 meq/L (ref 19–32)
Calcium: 9 mg/dL (ref 8.4–10.5)
Creatinine, Ser: 1 mg/dL (ref 0.40–1.50)
GFR: 77.85 mL/min (ref >60.00)
Glucose, Bld: 122 mg/dL — ABNORMAL HIGH (ref 70–99)
POTASSIUM: 4.4 meq/L (ref 3.5–5.1)
Sodium: 140 mEq/L (ref 135–145)

## 2017-12-12 LAB — HEMOGLOBIN A1C: Hgb A1c MFr Bld: 6.3 % (ref 4.6–6.5)

## 2017-12-12 MED ORDER — FLUOCINOLONE ACETONIDE 0.01 % OT OIL: 5.0000 [drp] | TOPICAL_OIL | Freq: Two times a day (BID) | OTIC | 0 refills | Status: DC

## 2017-12-12 NOTE — Assessment & Plan Note (Signed)
Chronic problem.  Hx of good control.  UTD on foot exam, eye exam.  On ARB for renal protection.  Check labs.  Adjust meds prn

## 2017-12-12 NOTE — Patient Instructions (Addendum)
Follow up in 3-4 months to recheck sugar, BP, cholesterol We'll notify you of your lab results and make any changes if needed Continue to work on healthy diet and regular exercise- you can do it! Use the ear drops as needed for dry, itching ears Call with any questions or concerns Happy Holidays!!

## 2017-12-12 NOTE — Progress Notes (Signed)
   Subjective:    Patient ID: [name], male    DOB: January 08, 1945, 72 y.o.   MRN: [id number]  HPI DM- chronic problem, on Amaryl 2mg  daily, Metformin 500mg  BID.  On ARB for renal protection.  UTD on eye exam, foot exam.  Last A1C 6.3  'feeling fine'.  No regular exercise due to multiple joint issues and back pain- some recumbent bike and walking depending on weather.  No CP, SOB, HAs, visual changes, abd pain, N/V.  No numbness/tingling of hands/feet.  Denies symptomatic lows.    Ear itching- R sided, ongoing.  Some temporary relief w/ mupirocin.  Review of Systems For ROS see HPI     Objective:   Physical Exam  Constitutional: He is oriented to person, place, and time. He appears well-developed and well-nourished. No distress.  HENT:  Head: Normocephalic and atraumatic.  Right Ear: External ear normal.  Left Ear: External ear normal.  TMs WNL bilaterally  Eyes: Pupils are equal, round, and reactive to light. Conjunctivae and EOM are normal.  Neck: Normal range of motion. Neck supple. No thyromegaly present.  Cardiovascular: Normal rate, regular rhythm, normal heart sounds and intact distal pulses.  No murmur heard. Pulmonary/Chest: Effort normal and breath sounds normal. No respiratory distress.  Abdominal: Soft. Bowel sounds are normal. He exhibits no distension.  Musculoskeletal: He exhibits no edema.  Lymphadenopathy:    He has no cervical adenopathy.  Neurological: He is alert and oriented to person, place, and time. No cranial nerve deficit.  Skin: Skin is warm and dry.  Psychiatric: He has a normal mood and affect. His behavior is normal.  Vitals reviewed.         Assessment & Plan:  Ear canal dryness- new.  No evidence of infxn.  Start DermOtic PRN.  Pt expressed understanding and is in agreement w/ plan.

## 2018-01-10 ENCOUNTER — Other Ambulatory Visit: Payer: Self-pay | Admitting: Family Medicine

## 2018-01-22 ENCOUNTER — Other Ambulatory Visit: Payer: Self-pay | Admitting: Family Medicine

## 2018-01-25 NOTE — Progress Notes (Addendum)
Subjective:   [name] is a 73 y.o. male who presents for Medicare Annual/Subsequent preventive examination.  Review of Systems:  No ROS.  Medicare Wellness Visit. Additional risk factors are reflected in the social history.  Cardiac Risk Factors include: advanced age (>5men, >34 women);dyslipidemia;diabetes mellitus;male gender;hypertension;family history of premature cardiovascular disease;obesity (BMI >30kg/m2)   Sleep patterns: Sleeps 6-7 hours. Up to void 2-3 times.  Home Safety/Smoke Alarms: Feels safe in home. Smoke alarms in place.  Living environment; residence and Firearm Safety: Lives with wife in 1 story home.  Seat Belt Safety/Bike Helmet: Wears seat belt.   Male:   CCS-Colonoscopy 07/23/2008, polyp. Recall 10 years.     PSA-Followed by [facility] Lab Results  Component Value Date   PSA 7.93 (H) 09/07/2012   PSA 6.04 (H) 11/23/2011   PSA 4.62 (H) 03/25/2009       Objective:    Vitals: BP 120/68 (BP Location: Left Arm, Patient Position: Sitting, Cuff Size: Normal)   Pulse 69   Ht 5\' 9"  (1.753 m)   Wt 245 lb 2 oz (111.2 kg)   SpO2 97%   BMI 36.20 kg/m   Body mass index is 36.2 kg/m.  Advanced Directives 01/26/2018 05/13/2017 01/25/2017 05/19/2016 05/12/2016 03/07/2012 03/07/2012  Does Patient Have a Medical Advance Directive? Yes Yes Yes Yes Yes Patient has advance directive, copy not in chart -  Type of Advance Directive Living will;Healthcare Power of [place] will [place];Living will [place];Living will [place];Living will - -  Does patient want to make changes to medical advance directive? - No - Patient declined - No - Patient declined No - Patient declined - -  Copy of [place] in Chart? No - copy requested - No - copy requested No - copy requested - Copy requested from other (Comment) -  Pre-existing out of facility DNR order (yellow form or pink MOST form) - - -  - - - No    Tobacco Social History   Tobacco Use  Smoking Status Never Smoker  Smokeless Tobacco Never Used     Counseling given: Not Answered    Past Medical History:  Diagnosis Date  . Anxiety   . BPH (benign prostatic hypertrophy) with urinary obstruction   . Cholelithiasis   . Clotting disorder ([place])   . Complication of anesthesia    urinary retention ; "bladder didnt wake up"; says last gallbladder surgery he shouldve been cathed with his med hx  . Diabetes mellitus    Type II  . Diverticulosis of colon   . DJD (degenerative joint disease)   . GERD (gastroesophageal reflux disease)   . Hemorrhoids   . History of colonic polyps   . History of DVT (deep vein thrombosis)   . History of kidney stones   . Hyperlipidemia   . Hypertension, benign   . Low back pain syndrome   . Nephrolithiasis   . pulmonary nodule, left lower lobe   . Spinal stenosis   . Varicose veins of lower extremities   . Venous insufficiency    Past Surgical History:  Procedure Laterality Date  . APPENDECTOMY    . bilateral inguinal hernia repairs    . CERVICAL DISC ARTHROPLASTY    . CHOLECYSTECTOMY  03/07/2012  . CHOLECYSTECTOMY N/A 03/07/2012   Procedure: LAPAROSCOPIC CHOLECYSTECTOMY;  Surgeon: Rolm Bookbinder, MD;  Location: [facility];  Service: General;  Laterality: N/A;  . HERNIA REPAIR  ventral hernia with mesh 1990s  . JOINT REPLACEMENT    . left ankle surgery with plate and screws  [id number]   by Dr. Sharol Given  . LUMBAR LAMINECTOMY/DECOMPRESSION MICRODISCECTOMY N/A 05/19/2016   Procedure: Microlumbar decompression L3-4, L4-5;  Surgeon: Susa Day, MD;  Location: [facility];  Service: Orthopedics;  Laterality: N/A;  . LUMBAR LAMINECTOMY/DECOMPRESSION MICRODISCECTOMY N/A 05/19/2017   Procedure: Microlumbar decompression Lumbar five-Sacral one;  Surgeon: Susa Day, MD;  Location: [facility];  Service: Orthopedics;  Laterality: N/A;  . sebaceous cyst surgery    . t1-t2 disc surgery  2007   by  Dr. Louanne Skye  . TONSILLECTOMY    . TOTAL KNEE ARTHROPLASTY     right and left   Family History  Problem Relation Age of Onset  . Gout Father   . Coronary artery disease Father 14       died of massive MI age 74  . Heart attack Father   . Diabetes Mother   . CAD Brother 62   Social History   Socioeconomic History  . Marital status: Married    Spouse name: Not on file  . Number of children: 2  . Years of education: Not on file  . Highest education level: Not on file  Occupational History  . Not on file  Social Needs  . Financial resource strain: Not on file  . Food insecurity:    Worry: Not on file    Inability: Not on file  . Transportation needs:    Medical: Not on file    Non-medical: Not on file  Tobacco Use  . Smoking status: Never Smoker  . Smokeless tobacco: Never Used  Substance and Sexual Activity  . Alcohol use: Yes    Comment: social  use  . Drug use: No  . Sexual activity: Not on file  Lifestyle  . Physical activity:    Days per week: Not on file    Minutes per session: Not on file  . Stress: Not on file  Relationships  . Social connections:    Talks on phone: Not on file    Gets together: Not on file    Attends religious service: Not on file    Active member of club or organization: Not on file    Attends meetings of clubs or organizations: Not on file    Relationship status: Not on file  Other Topics Concern  . Not on file  Social History Narrative   Retired.    Outpatient Encounter Medications as of 01/26/2018  Medication Sig  . alfuzosin (UROXATRAL) 10 MG 24 hr tablet Take 10 mg by mouth daily with breakfast.  . aspirin EC 81 MG tablet Take 1 tablet (81 mg total) by mouth daily. Resume 4 days post-op  . cetirizine (ZYRTEC) 10 MG tablet Take 10 mg by mouth daily.  . diclofenac (VOLTAREN) 75 MG EC tablet TAKE 1 TABLET BY MOUTH TWICE A DAY WITH A MEAL  . docusate sodium (COLACE) 100 MG capsule Take 1 capsule (100 mg total) by mouth 2 (two) times  daily as needed for mild constipation.  [name] glimepiride (AMARYL) 2 MG tablet TAKE 1 TABLET BY MOUTH DAILY BEFORE BREAKFAST  . losartan (COZAAR) 100 MG tablet Take 1 tablet (100 mg total) by mouth daily.  . metFORMIN (GLUCOPHAGE) 500 MG tablet Take 1 tablet (500 mg total) by mouth 2 (two) times daily with a meal.  . multivitamin (THERAGRAN) per tablet Take 1 tablet by mouth daily.   [name]  pantoprazole (PROTONIX) 40 MG tablet TAKE 1 TABLET BY MOUTH TWICE A DAY  . polyethylene glycol (MIRALAX / GLYCOLAX) packet Take 17 g by mouth daily as needed for mild constipation.  . ranitidine (ZANTAC) 300 MG tablet TAKE 1 TABLET BY MOUTH EVERYDAY AT BEDTIME  . simvastatin (ZOCOR) 40 MG tablet TAKE 1 TABLET BY MOUTH EVERYDAY AT BEDTIME  . amoxicillin (AMOXIL) 500 MG capsule TAKE 4 CAPSULES BY MOUTH 1 HOUR PRIOR TO APPOINTMENT  . Fluocinolone Acetonide 0.01 % OIL Place 5 drops in ear(s) 2 (two) times daily. (Patient not taking: Reported on 01/26/2018)   No facility-administered encounter medications on file as of 01/26/2018.     Activities of Daily Living In your present state of health, do you have any difficulty performing the following activities: 01/26/2018 12/12/2017  Hearing? N N  Vision? N N  Difficulty concentrating or making decisions? N N  Walking or climbing stairs? Y N  Dressing or bathing? N N  Doing errands, shopping? N N  Preparing Food and eating ? N -  Using the Toilet? N -  In the past six months, have you accidently leaked urine? N -  Do you have problems with loss of bowel control? N -  Managing your Medications? N -  Managing your Finances? N -  Housekeeping or managing your Housekeeping? N -  Some recent data might be hidden    Patient Care Team: Midge Minium, MD as PCP - General (Family Medicine) Paralee Cancel, MD as Consulting Physician (Orthopedic Surgery) Irene Shipper, MD as Consulting Physician (Gastroenterology) Sharyne Peach, MD as Consulting Physician  (Ophthalmology) [facility], Michae Kava, MD as Attending Physician Susa Day, MD as Consulting Physician (Orthopedic Surgery) Rolm Bookbinder, MD as Consulting Physician (Dermatology) [place] (Dentistry)   Assessment:   This is a routine wellness examination for [name].  Exercise Activities and Dietary recommendations Current Exercise Habits: Structured exercise class, Type of exercise: walking;Other - see comments(recumbant bike), Frequency (Times/Week): 4, Exercise limited by: orthopedic condition(s)   Diet (meal preparation, eat out, water intake, caffeinated beverages, dairy products, fruits and vegetables): Drinks water. Soda 2/week.  Breakfast: cereal; eggs; coffee Lunch: snacks; sandwich Dinner: protein/veggies  Goals    . Increase physical activity     Increase activity once cleared by ortho (back surgery).     . Patient Stated     Be able to increase activity by getting pain under control.        Fall Risk Fall Risk  01/26/2018 12/12/2017 08/19/2017 01/25/2017 01/25/2017  Falls in the past year? 0 0 No No No    Depression Screen PHQ 2/9 Scores 01/26/2018 12/12/2017 08/19/2017 01/25/2017  PHQ - 2 Score 0 0 0 0  PHQ- 9 Score - 0 0 0    Cognitive Function MMSE - Mini Mental State Exam 01/26/2018 01/25/2017  Orientation to time 5 5  Orientation to Place 5 5  Registration 3 3  Attention/ Calculation 3 3  Recall 3 2  Language- name 2 objects 2 2  Language- repeat 1 1  Language- follow 3 step command 3 3  Language- read & follow direction 1 1  Write a sentence 1 1  Copy design 0 1  Total score 27 27        Immunization History  Administered Date(s) Administered  . H1N1 01/03/2008  . Influenza Split 09/21/2010, 09/14/2011  . Influenza Whole 11/05/2008, 09/15/2009  . Influenza,inj,Quad PF,6+ Mos 10/05/2012, 10/26/2013, 09/27/2014, 09/23/2015, 09/29/2016, 08/30/2017  . Influenza,inj,quad, With Preservative 10/04/2016  .  Pneumococcal Conjugate-13 12/18/2013  .  Pneumococcal Polysaccharide-23 12/26/2012  . Pneumococcal-Unspecified 01/05/2015  . Tdap 11/22/2008     Screening Tests Health Maintenance  Topic Date Due  . Hepatitis C Screening  08/20/2018 (Originally 1945/09/22)  . HEMOGLOBIN A1C  06/13/2018  . COLONOSCOPY  07/24/2018  . OPHTHALMOLOGY EXAM  08/13/2018  . FOOT EXAM  08/20/2018  . TETANUS/TDAP  11/23/2018  . INFLUENZA VACCINE  Completed  . PNA vac Low Risk Adult  Completed        Plan:    Bring a copy of your living will and/or healthcare power of attorney to your next office visit.  Continue doing brain stimulating activities (puzzles, reading, adult coloring books, staying active) to keep memory sharp.   I have personally reviewed and noted the following in the patient's chart:   . Medical and social history . Use of alcohol, tobacco or illicit drugs  . Current medications and supplements . Functional ability and status . Nutritional status . Physical activity . Advanced directives . List of other physicians . Hospitalizations, surgeries, and ER visits in previous 12 months . Vitals . Screenings to include cognitive, depression, and falls . Referrals and appointments  In addition, I have reviewed and discussed with patient certain preventive protocols, quality metrics, and best practice recommendations. A written personalized care plan for preventive services as well as general preventive health recommendations were provided to patient.     Gerilyn Nestle, RN  01/26/2018  F/U with PCP 03/2018.   Reviewed documentation provided by RN and agree w/ above.  Annye Asa, MD

## 2018-01-26 ENCOUNTER — Other Ambulatory Visit: Payer: Self-pay

## 2018-01-26 ENCOUNTER — Ambulatory Visit (INDEPENDENT_AMBULATORY_CARE_PROVIDER_SITE_OTHER): Payer: Medicare Other

## 2018-01-26 VITALS — BP 120/68 | HR 69 | Ht 69.0 in | Wt 245.1 lb

## 2018-01-26 DIAGNOSIS — Z0000 Encounter for general adult medical examination without abnormal findings: Secondary | ICD-10-CM

## 2018-01-26 DIAGNOSIS — E669 Obesity, unspecified: Secondary | ICD-10-CM

## 2018-01-26 MED ORDER — METFORMIN HCL 500 MG PO TABS: 500.0000 mg | ORAL_TABLET | Freq: Two times a day (BID) | ORAL | 0 refills | Status: DC

## 2018-01-26 NOTE — Patient Instructions (Addendum)
Bring a copy of your living will and/or healthcare power of attorney to your next office visit.  Continue doing brain stimulating activities (puzzles, reading, adult coloring books, staying active) to keep memory sharp.    Health Maintenance, Male A healthy lifestyle and preventive care is important for your health and wellness. Ask your health care provider about what schedule of regular examinations is right for you. What should I know about weight and diet? Eat a Healthy Diet  Eat plenty of vegetables, fruits, whole grains, low-fat dairy products, and lean protein.  Do not eat a lot of foods high in solid fats, added sugars, or salt.  Maintain a Healthy Weight Regular exercise can help you achieve or maintain a healthy weight. You should:  Do at least 150 minutes of exercise each week. The exercise should increase your heart rate and make you sweat (moderate-intensity exercise).  Do strength-training exercises at least twice a week. Watch Your Levels of Cholesterol and Blood Lipids  Have your blood tested for lipids and cholesterol every 5 years starting at 74 years of age. If you are at high risk for heart disease, you should start having your blood tested when you are 74 years old. You may need to have your cholesterol levels checked more often if: ? Your lipid or cholesterol levels are high. ? You are older than 74 years of age. ? You are at high risk for heart disease. What should I know about cancer screening? Many types of cancers can be detected early and may often be prevented. Lung Cancer  You should be screened every year for lung cancer if: ? You are a current smoker who has smoked for at least 30 years. ? You are a former smoker who has quit within the past 15 years.  Talk to your health care provider about your screening options, when you should start screening, and how often you should be screened. Colorectal Cancer  Routine colorectal cancer screening usually  begins at 74 years of age and should be repeated every 5-10 years until you are 75 years old. You may need to be screened more often if early forms of precancerous polyps or small growths are found. Your health care provider may recommend screening at an earlier age if you have risk factors for colon cancer.  Your health care provider may recommend using home test kits to check for hidden blood in the stool.  A small camera at the end of a tube can be used to examine your colon (sigmoidoscopy or colonoscopy). This checks for the earliest forms of colorectal cancer. Prostate and Testicular Cancer  Depending on your age and overall health, your health care provider may do certain tests to screen for prostate and testicular cancer.  Talk to your health care provider about any symptoms or concerns you have about testicular or prostate cancer. Skin Cancer  Check your skin from head to toe regularly.  Tell your health care provider about any new moles or changes in moles, especially if: ? There is a change in a mole's size, shape, or color. ? You have a mole that is larger than a pencil eraser.  Always use sunscreen. Apply sunscreen liberally and repeat throughout the day.  Protect yourself by wearing long sleeves, pants, a wide-brimmed hat, and sunglasses when outside. What should I know about heart disease, diabetes, and high blood pressure?  If you are 18-39 years of age, have your blood pressure checked every 3-5 years. If you are 40   years of age or older, have your blood pressure checked every year. You should have your blood pressure measured twice-once when you are at a hospital or clinic, and once when you are not at a hospital or clinic. Record the average of the two measurements. To check your blood pressure when you are not at a hospital or clinic, you can use: ? An automated blood pressure machine at a pharmacy. ? A home blood pressure monitor.  Talk to your health care provider  about your target blood pressure.  If you are between 45-79 years old, ask your health care provider if you should take aspirin to prevent heart disease.  Have regular diabetes screenings by checking your fasting blood sugar level. ? If you are at a normal weight and have a low risk for diabetes, have this test once every three years after the age of 45. ? If you are overweight and have a high risk for diabetes, consider being tested at a younger age or more often.  A one-time screening for abdominal aortic aneurysm (AAA) by ultrasound is recommended for men aged 65-75 years who are current or former smokers. What should I know about preventing infection? Hepatitis B If you have a higher risk for hepatitis B, you should be screened for this virus. Talk with your health care provider to find out if you are at risk for hepatitis B infection. Hepatitis C Blood testing is recommended for:  Everyone born from 1945 through 1965.  Anyone with known risk factors for hepatitis C. Sexually Transmitted Diseases (STDs)  You should be screened each year for STDs including gonorrhea and chlamydia if: ? You are sexually active and are younger than 74 years of age. ? You are older than 74 years of age and your health care provider tells you that you are at risk for this type of infection. ? Your sexual activity has changed since you were last screened and you are at an increased risk for chlamydia or gonorrhea. Ask your health care provider if you are at risk.  Talk with your health care provider about whether you are at high risk of being infected with HIV. Your health care provider may recommend a prescription medicine to help prevent HIV infection. What else can I do?  Schedule regular health, dental, and eye exams.  Stay current with your vaccines (immunizations).  Do not use any tobacco products, such as cigarettes, chewing tobacco, and e-cigarettes. If you need help quitting, ask your health  care provider.  Limit alcohol intake to no more than 2 drinks per day. One drink equals 12 ounces of beer, 5 ounces of wine, or 1 ounces of hard liquor.  Do not use street drugs.  Do not share needles.  Ask your health care provider for help if you need support or information about quitting drugs.  Tell your health care provider if you often feel depressed.  Tell your health care provider if you have ever been abused or do not feel safe at home. This information is not intended to replace advice given to you by your health care provider. Make sure you discuss any questions you have with your health care provider. Document Released: 06/19/2007 Document Revised: 08/20/2015 Document Reviewed: 09/24/2014 Elsevier Interactive Patient Education  2019 [employer]vier Inc.  

## 2018-01-28 ENCOUNTER — Other Ambulatory Visit: Payer: Self-pay | Admitting: Family Medicine

## 2018-02-10 ENCOUNTER — Other Ambulatory Visit: Payer: Self-pay | Admitting: Family Medicine

## 2018-03-20 ENCOUNTER — Ambulatory Visit: Payer: Self-pay | Admitting: Family Medicine

## 2018-03-20 MED ORDER — GUAIFENESIN-CODEINE 100-10 MG/5ML PO SYRP: 10.0000 mL | ORAL_SOLUTION | Freq: Three times a day (TID) | ORAL | 0 refills | Status: DC | PRN

## 2018-03-20 NOTE — Telephone Encounter (Signed)
I returned pt's call.   He said his symptoms started out as allergies.   Now he is c/o pressure around his eyes and coughing up white mucus.   His throat is a little irritated and I feel like I'm trying to lose my voice.   No fever.   He is requesting cough medicine be called in since the Mucinex he has been using is not helping.    He is requesting not to come in if possible.  I called the practice and spoke with the flow coordinator.  She instructed me to send my triage notes over and she would check with Dr. Birdie Riddle to see if she will call in something or if she wanted to see him.   I let the pt know someone would be calling him back.   He was agreeable to this plan.   Reason for Disposition . Lots of coughing  Answer Assessment - Initial Assessment Questions 1. LOCATION: "Where does it hurt?"      I'm having pressure around my eyes and my nose. 2. ONSET: "When did the sinus pain start?"  (e.g., hours, days)      Friday.  I'm using Mucinex but it's not helping. 3. SEVERITY: "How bad is the pain?"   (Scale 1-10; mild, moderate or severe)   - MILD (1-3): doesn't interfere with normal activities    - MODERATE (4-7): interferes with normal activities (e.g., work or school) or awakens from sleep   - SEVERE (8-10): excruciating pain and patient unable to do any normal activities        Keeping you awake at night. 4. RECURRENT SYMPTOM: "Have you ever had sinus problems before?" If so, ask: "When was the last time?" and "What happened that time?"      I have a lot of drainage down the back of my throat and a cough.   Coughing up mucus that's white. 5. NASAL CONGESTION: "Is the nose blocked?" If so, ask, "Can you open it or must you breathe through the mouth?"     I blow my nose but not much comes out. 6. NASAL DISCHARGE: "Do you have discharge from your nose?" If so ask, "What color?"     No 7. FEVER: "Do you have a fever?" If so, ask: "What is it, how was it measured, and when did it start?"     No  I've checked it several times. 8. OTHER SYMPTOMS: "Do you have any other symptoms?" (e.g., sore throat, cough, earache, difficulty breathing)     I feel like my voice is hoarse and my throat is mildly irritated. 9. PREGNANCY: "Is there any chance you are pregnant?" "When was your last menstrual period?"     N/A  Protocols used: SINUS PAIN OR CONGESTION-A-AH

## 2018-03-20 NOTE — Telephone Encounter (Signed)
Patient and wife called in upset that only a cough medication was called in.  He states that he was expecting something for his congestion as well.   Dr. Birdie Riddle advised that patient could take Coricidin HBP  - wife was insistentt that patient has to be really careful of what he takes.  Advised that Dr. Birdie Riddle is aware of that, and she stated that this was safe to use.   They were not happy because now instead of just having to use the drive thru at the pharmacy to pick up RX, she now has to go in and be exposed "all kinds of things"  Advised per Dr. Birdie Riddle if they wanted antibiotics, he would need an appointment.   They were not happy that we are advising that he would have to come in and be "exposed" in order to get medications.  Eventually they just stated that if he needed something further they would call back.

## 2018-03-20 NOTE — Addendum Note (Signed)
Addended by: Midge Minium on: 03/20/2018 01:13 PM   Modules accepted: Orders

## 2018-03-20 NOTE — Telephone Encounter (Signed)
Prescription sent for codeine based cough syrup

## 2018-03-22 ENCOUNTER — Other Ambulatory Visit: Payer: Self-pay | Admitting: Physician Assistant

## 2018-03-22 ENCOUNTER — Encounter: Payer: Self-pay | Admitting: Physician Assistant

## 2018-03-22 ENCOUNTER — Other Ambulatory Visit: Payer: Self-pay

## 2018-03-22 ENCOUNTER — Ambulatory Visit (INDEPENDENT_AMBULATORY_CARE_PROVIDER_SITE_OTHER): Payer: Medicare Other | Admitting: Physician Assistant

## 2018-03-22 VITALS — BP 128/72 | HR 96 | Temp 98.5°F | Resp 16 | Ht 69.0 in | Wt 244.0 lb

## 2018-03-22 DIAGNOSIS — B9689 Other specified bacterial agents as the cause of diseases classified elsewhere: Secondary | ICD-10-CM | POA: Diagnosis not present

## 2018-03-22 DIAGNOSIS — J019 Acute sinusitis, unspecified: Secondary | ICD-10-CM

## 2018-03-22 MED ORDER — AMOXICILLIN-POT CLAVULANATE 875-125 MG PO TABS: 1.0000 | ORAL_TABLET | Freq: Two times a day (BID) | ORAL | 0 refills | Status: DC

## 2018-03-22 MED ORDER — BENZONATATE 100 MG PO CAPS: 100.0000 mg | ORAL_CAPSULE | Freq: Three times a day (TID) | ORAL | 0 refills | Status: DC | PRN

## 2018-03-22 MED ORDER — BENZONATATE 200 MG PO CAPS: 200.0000 mg | ORAL_CAPSULE | Freq: Three times a day (TID) | ORAL | 0 refills | Status: DC | PRN

## 2018-03-22 NOTE — Addendum Note (Signed)
Addended by: Leonidas Romberg on: 03/22/2018 10:42 AM   Modules accepted: Orders

## 2018-03-22 NOTE — Patient Instructions (Signed)
Please take antibiotic as directed.  Increase fluid intake.  Use Saline nasal spray.  Take a daily multivitamin. Start Tessalon for the cough. You can take this along with the Robitussin AC. 600 mg Mucinex twice daily (since the Robitussin already contains the same active ingredient as Mucinex although a small dose).  Place a humidifier in the bedroom.  Please call or return clinic if symptoms are not improving.  Sinusitis Sinusitis is redness, soreness, and swelling (inflammation) of the paranasal sinuses. Paranasal sinuses are air pockets within the bones of your face (beneath the eyes, the middle of the forehead, or above the eyes). In healthy paranasal sinuses, mucus is able to drain out, and air is able to circulate through them by way of your nose. However, when your paranasal sinuses are inflamed, mucus and air can become trapped. This can allow bacteria and other germs to grow and cause infection. Sinusitis can develop quickly and last only a short time (acute) or continue over a long period (chronic). Sinusitis that lasts for more than 12 weeks is considered chronic.  CAUSES  Causes of sinusitis include:  Allergies.  Structural abnormalities, such as displacement of the cartilage that separates your nostrils (deviated septum), which can decrease the air flow through your nose and sinuses and affect sinus drainage.  Functional abnormalities, such as when the small hairs (cilia) that line your sinuses and help remove mucus do not work properly or are not present. SYMPTOMS  Symptoms of acute and chronic sinusitis are the same. The primary symptoms are pain and pressure around the affected sinuses. Other symptoms include:  Upper toothache.  Earache.  Headache.  Bad breath.  Decreased sense of smell and taste.  A cough, which worsens when you are lying flat.  Fatigue.  Fever.  Thick drainage from your nose, which often is green and may contain pus (purulent).  Swelling and  warmth over the affected sinuses. DIAGNOSIS  Your caregiver will perform a physical exam. During the exam, your caregiver may:  Look in your nose for signs of abnormal growths in your nostrils (nasal polyps).  Tap over the affected sinus to check for signs of infection.  View the inside of your sinuses (endoscopy) with a special imaging device with a light attached (endoscope), which is inserted into your sinuses. If your caregiver suspects that you have chronic sinusitis, one or more of the following tests may be recommended:  Allergy tests.  Nasal culture A sample of mucus is taken from your nose and sent to a lab and screened for bacteria.  Nasal cytology A sample of mucus is taken from your nose and examined by your caregiver to determine if your sinusitis is related to an allergy. TREATMENT  Most cases of acute sinusitis are related to a viral infection and will resolve on their own within 10 days. Sometimes medicines are prescribed to help relieve symptoms (pain medicine, decongestants, nasal steroid sprays, or saline sprays).  However, for sinusitis related to a bacterial infection, your caregiver will prescribe antibiotic medicines. These are medicines that will help kill the bacteria causing the infection.  Rarely, sinusitis is caused by a fungal infection. In theses cases, your caregiver will prescribe antifungal medicine. For some cases of chronic sinusitis, surgery is needed. Generally, these are cases in which sinusitis recurs more than 3 times per year, despite other treatments. HOME CARE INSTRUCTIONS   Drink plenty of water. Water helps thin the mucus so your sinuses can drain more easily.  Use a humidifier.  Inhale steam 3 to 4 times a day (for example, sit in the bathroom with the shower running).  Apply a warm, moist washcloth to your face 3 to 4 times a day, or as directed by your caregiver.  Use saline nasal sprays to help moisten and clean your sinuses.  Take  over-the-counter or prescription medicines for pain, discomfort, or fever only as directed by your caregiver. SEEK IMMEDIATE MEDICAL CARE IF:  You have increasing pain or severe headaches.  You have nausea, vomiting, or drowsiness.  You have swelling around your face.  You have vision problems.  You have a stiff neck.  You have difficulty breathing. MAKE SURE YOU:   Understand these instructions.  Will watch your condition.  Will get help right away if you are not doing well or get worse. Document Released: 12/21/2004 Document Revised: 03/15/2011 Document Reviewed: 01/05/2011 [facility] Patient Information 2014 [place], [place].

## 2018-03-22 NOTE — Progress Notes (Signed)
Patient presents to clinic today c/o chest congestion and cough with sinus pressure, nasal congestion, and now maxillary sinus pain. Denies chills, aches, sob. Notes a borderline fever at night, but none during the day. Has been taking Robitussin AC sent in by PCP with only some improvement in cough. Is careful with other OTC medications due to medical history and current medication regimen.   Past Medical History:  Diagnosis Date  . Anxiety   . BPH (benign prostatic hypertrophy) with urinary obstruction   . Cholelithiasis   . Clotting disorder ([place])   . Complication of anesthesia    urinary retention ; "bladder didnt wake up"; says last gallbladder surgery he shouldve been cathed with his med hx  . Diabetes mellitus    Type II  . Diverticulosis of colon   . DJD (degenerative joint disease)   . GERD (gastroesophageal reflux disease)   . Hemorrhoids   . History of colonic polyps   . History of DVT (deep vein thrombosis)   . History of kidney stones   . Hyperlipidemia   . Hypertension, benign   . Low back pain syndrome   . Nephrolithiasis   . pulmonary nodule, left lower lobe   . Spinal stenosis   . Varicose veins of lower extremities   . Venous insufficiency     Current Outpatient Medications on File Prior to Visit  Medication Sig Dispense Refill  . alfuzosin (UROXATRAL) 10 MG 24 hr tablet Take 10 mg by mouth daily with breakfast.    . amoxicillin (AMOXIL) 500 MG capsule TAKE 4 CAPSULES BY MOUTH 1 HOUR PRIOR TO APPOINTMENT    . aspirin EC 81 MG tablet Take 1 tablet (81 mg total) by mouth daily. Resume 4 days post-op    . cetirizine (ZYRTEC) 10 MG tablet Take 10 mg by mouth daily.    . diclofenac (VOLTAREN) 75 MG EC tablet TAKE 1 TABLET BY MOUTH TWICE A DAY WITH A MEAL 180 tablet 1  . docusate sodium (COLACE) 100 MG capsule Take 1 capsule (100 mg total) by mouth 2 (two) times daily as needed for mild constipation. 40 capsule 1  . Fluocinolone Acetonide 0.01 % OIL Place 5 drops  in ear(s) 2 (two) times daily. 20 mL 0  . glimepiride (AMARYL) 2 MG tablet TAKE 1 TABLET BY MOUTH DAILY BEFORE BREAKFAST 90 tablet 1  . guaiFENesin-codeine (ROBITUSSIN AC) 100-10 MG/5ML syrup Take 10 mLs by mouth 3 (three) times daily as needed for cough. 120 mL 0  . losartan (COZAAR) 100 MG tablet TAKE 1 TABLET BY MOUTH EVERY DAY 90 tablet 1  . metFORMIN (GLUCOPHAGE) 500 MG tablet Take 1 tablet (500 mg total) by mouth 2 (two) times daily with a meal. 180 tablet 0  . multivitamin (THERAGRAN) per tablet Take 1 tablet by mouth daily.     . pantoprazole (PROTONIX) 40 MG tablet TAKE 1 TABLET BY MOUTH TWICE A DAY 180 tablet 1  . polyethylene glycol (MIRALAX / GLYCOLAX) packet Take 17 g by mouth daily as needed for mild constipation. 14 each 0  . ranitidine (ZANTAC) 300 MG tablet TAKE 1 TABLET BY MOUTH EVERYDAY AT BEDTIME 90 tablet 1  . simvastatin (ZOCOR) 40 MG tablet TAKE 1 TABLET BY MOUTH EVERYDAY AT BEDTIME 90 tablet 1   No current facility-administered medications on file prior to visit.     Allergies  Allergen Reactions  . Ciprofloxacin Rash  . Niacin Other (See Comments)    REACTION: hypotension, shaky  Family History  Problem Relation Age of Onset  . Gout Father   . Coronary artery disease Father 28       died of massive MI age 74  . Heart attack Father   . Diabetes Mother   . CAD Brother 8    Social History   Socioeconomic History  . Marital status: Married    Spouse name: Not on file  . Number of children: 2  . Years of education: Not on file  . Highest education level: Not on file  Occupational History  . Not on file  Social Needs  . Financial resource strain: Not on file  . Food insecurity:    Worry: Not on file    Inability: Not on file  . Transportation needs:    Medical: Not on file    Non-medical: Not on file  Tobacco Use  . Smoking status: Never Smoker  . Smokeless tobacco: Never Used  Substance and Sexual Activity  . Alcohol use: Yes    Comment:  social  use  . Drug use: No  . Sexual activity: Not on file  Lifestyle  . Physical activity:    Days per week: Not on file    Minutes per session: Not on file  . Stress: Not on file  Relationships  . Social connections:    Talks on phone: Not on file    Gets together: Not on file    Attends religious service: Not on file    Active member of club or organization: Not on file    Attends meetings of clubs or organizations: Not on file    Relationship status: Not on file  Other Topics Concern  . Not on file  Social History Narrative   Retired.   Review of Systems - See HPI.  All other ROS are negative.  BP 128/72   Pulse 96   Temp 98.5 F (36.9 C) (Oral)   Resp 16   Ht 5\' 9"  (1.753 m)   Wt 244 lb (110.7 kg)   SpO2 98%   BMI 36.03 kg/m   Physical Exam Vitals signs reviewed.  Constitutional:      Appearance: Normal appearance.  HENT:     Head: Normocephalic and atraumatic.     Right Ear: Tympanic membrane normal.     Left Ear: Tympanic membrane normal.     Nose: Congestion present.     Right Sinus: Maxillary sinus tenderness present.     Left Sinus: Maxillary sinus tenderness present.     Mouth/Throat:     Mouth: Mucous membranes are moist.  Eyes:     Conjunctiva/sclera: Conjunctivae normal.  Neck:     Musculoskeletal: Neck supple.  Cardiovascular:     Rate and Rhythm: Normal rate and regular rhythm.     Pulses: Normal pulses.     Heart sounds: Normal heart sounds.  Pulmonary:     Effort: Pulmonary effort is normal.     Breath sounds: Normal breath sounds.  Neurological:     Mental Status: He is alert.  Psychiatric:        Mood and Affect: Mood normal.    Assessment/Plan: 1. Acute bacterial sinusitis Rx Augmentin.  Increase fluids.  Rest.  Saline nasal spray.  Probiotic.  Mucinex as directed.  Humidifier in bedroom. Tessalon per orders.  Call or return to clinic if symptoms are not improving.  - amoxicillin-clavulanate (AUGMENTIN) 875-125 MG tablet; Take  1 tablet by mouth 2 (two) times daily.  Dispense: 14  tablet; Refill: 0   Leeanne Rio, PA-C

## 2018-03-28 ENCOUNTER — Other Ambulatory Visit: Payer: Self-pay | Admitting: Family Medicine

## 2018-04-03 ENCOUNTER — Ambulatory Visit: Payer: Medicare Other | Admitting: Family Medicine

## 2018-04-05 ENCOUNTER — Other Ambulatory Visit: Payer: Self-pay | Admitting: Family Medicine

## 2018-04-05 DIAGNOSIS — N499 Inflammatory disorder of unspecified male genital organ: Secondary | ICD-10-CM | POA: Diagnosis not present

## 2018-04-21 ENCOUNTER — Telehealth: Payer: Self-pay | Admitting: Family Medicine

## 2018-04-21 MED ORDER — FAMOTIDINE 20 MG PO TABS: 20.0000 mg | ORAL_TABLET | Freq: Two times a day (BID) | ORAL | 1 refills | Status: DC

## 2018-04-21 NOTE — Telephone Encounter (Signed)
Ok to switch to Pepcid 20mg  BID, #180, 1 refill

## 2018-04-21 NOTE — Telephone Encounter (Signed)
Medication has been called in. Patient is aware.

## 2018-04-21 NOTE — Telephone Encounter (Signed)
Copied from [place] [id number]. Topic: Quick Communication - See Telephone Encounter >> Apr 21, 2018  1:25 PM Blase Mess A wrote: CRM for notification. See Telephone encounter for: 04/21/18.  Patient is calling he received a letter from his insurance that ranitidine (ZANTAC) 300 MG tablet [[id number]] has been withdrawn from the market for purity reasons. Please advise what medication would Dr. Birdie Riddle like the patient to take. The patinent is going to need something for tonight. He normally gets 3 months at a time. CB- [phone]

## 2018-06-21 ENCOUNTER — Other Ambulatory Visit: Payer: Self-pay | Admitting: Family Medicine

## 2018-06-25 ENCOUNTER — Other Ambulatory Visit: Payer: Self-pay | Admitting: Family Medicine

## 2018-06-26 ENCOUNTER — Telehealth: Payer: Self-pay | Admitting: Emergency Medicine

## 2018-06-26 NOTE — Telephone Encounter (Signed)
Pt to stop Ranitidine (Zantac) and we can send in prescription Pepcid (famotidine) 40mg  [facility], #30, 3 refills

## 2018-06-26 NOTE — Telephone Encounter (Signed)
Spoke with patient and he advised that he is currently on the pepcid BID and is having no concerns. Pt was genuinely confused with my phone call to him. Advised pt that if something changed to please let me know.

## 2018-06-26 NOTE — Telephone Encounter (Signed)
Patient is currently taking Pepcid bid and Zantac at bedtime. Patient has tried Prevacid 30 mg and Protonix 40 mg  Since Zantac is recalled. What else can patient medication be changed to?

## 2018-06-26 NOTE — Telephone Encounter (Signed)
Copied from [place] [id number]. Topic: General - Inquiry >> Jun 26, 2018  9:47 AM Mathis Bud wrote: Reason for CRM: Cindy from [facility] called stating all  ranitidine (ZANTAC) has been taken off the shelves.  Jenny Reichmann stated PCP needs to call patient something else in. Jenny Reichmann from [facility] call back # [phone]

## 2018-07-05 ENCOUNTER — Encounter: Payer: Self-pay | Admitting: Internal Medicine

## 2018-07-21 ENCOUNTER — Other Ambulatory Visit: Payer: Self-pay | Admitting: Family Medicine

## 2018-07-27 ENCOUNTER — Other Ambulatory Visit: Payer: Self-pay | Admitting: Family Medicine

## 2018-08-14 DIAGNOSIS — M7062 Trochanteric bursitis, left hip: Secondary | ICD-10-CM | POA: Diagnosis not present

## 2018-08-15 DIAGNOSIS — H2513 Age-related nuclear cataract, bilateral: Secondary | ICD-10-CM | POA: Diagnosis not present

## 2018-08-15 DIAGNOSIS — E119 Type 2 diabetes mellitus without complications: Secondary | ICD-10-CM | POA: Diagnosis not present

## 2018-08-15 DIAGNOSIS — H5213 Myopia, bilateral: Secondary | ICD-10-CM | POA: Diagnosis not present

## 2018-08-15 DIAGNOSIS — H52203 Unspecified astigmatism, bilateral: Secondary | ICD-10-CM | POA: Diagnosis not present

## 2018-08-15 LAB — HM DIABETES EYE EXAM

## 2018-08-29 ENCOUNTER — Encounter: Payer: Self-pay | Admitting: General Practice

## 2018-09-20 ENCOUNTER — Other Ambulatory Visit: Payer: Self-pay

## 2018-09-20 ENCOUNTER — Ambulatory Visit (INDEPENDENT_AMBULATORY_CARE_PROVIDER_SITE_OTHER): Payer: Medicare Other

## 2018-09-20 DIAGNOSIS — Z23 Encounter for immunization: Secondary | ICD-10-CM

## 2018-09-21 ENCOUNTER — Other Ambulatory Visit: Payer: Self-pay | Admitting: Family Medicine

## 2018-09-27 DIAGNOSIS — L821 Other seborrheic keratosis: Secondary | ICD-10-CM | POA: Diagnosis not present

## 2018-09-27 DIAGNOSIS — D2261 Melanocytic nevi of right upper limb, including shoulder: Secondary | ICD-10-CM | POA: Diagnosis not present

## 2018-09-27 DIAGNOSIS — D2371 Other benign neoplasm of skin of right lower limb, including hip: Secondary | ICD-10-CM | POA: Diagnosis not present

## 2018-09-27 DIAGNOSIS — L738 Other specified follicular disorders: Secondary | ICD-10-CM | POA: Diagnosis not present

## 2018-09-27 DIAGNOSIS — I788 Other diseases of capillaries: Secondary | ICD-10-CM | POA: Diagnosis not present

## 2018-09-27 DIAGNOSIS — D692 Other nonthrombocytopenic purpura: Secondary | ICD-10-CM | POA: Diagnosis not present

## 2018-09-27 DIAGNOSIS — L814 Other melanin hyperpigmentation: Secondary | ICD-10-CM | POA: Diagnosis not present

## 2018-09-27 DIAGNOSIS — D1801 Hemangioma of skin and subcutaneous tissue: Secondary | ICD-10-CM | POA: Diagnosis not present

## 2018-09-27 DIAGNOSIS — L723 Sebaceous cyst: Secondary | ICD-10-CM | POA: Diagnosis not present

## 2018-09-27 DIAGNOSIS — L0889 Other specified local infections of the skin and subcutaneous tissue: Secondary | ICD-10-CM | POA: Diagnosis not present

## 2018-09-27 DIAGNOSIS — D2372 Other benign neoplasm of skin of left lower limb, including hip: Secondary | ICD-10-CM | POA: Diagnosis not present

## 2018-10-02 ENCOUNTER — Other Ambulatory Visit: Payer: Self-pay | Admitting: Family Medicine

## 2018-10-08 ENCOUNTER — Other Ambulatory Visit: Payer: Self-pay | Admitting: Family Medicine

## 2018-10-25 ENCOUNTER — Other Ambulatory Visit: Payer: Self-pay | Admitting: Family Medicine

## 2018-11-05 ENCOUNTER — Other Ambulatory Visit: Payer: Self-pay | Admitting: Family Medicine

## 2018-11-12 IMAGING — DX DG LUMBAR SPINE 2-3V
2 series · 2 of 2 positions shown · non-contrast
Comparison: Reconstructed images through the lumbar spine from an
abdominal and pelvic CT scan September 30, 2014.

CLINICAL DATA: Preoperative exam prior to lumbar spine surgery.

EXAM:
LUMBAR SPINE - 2-3 VIEW

[l-spine ap]
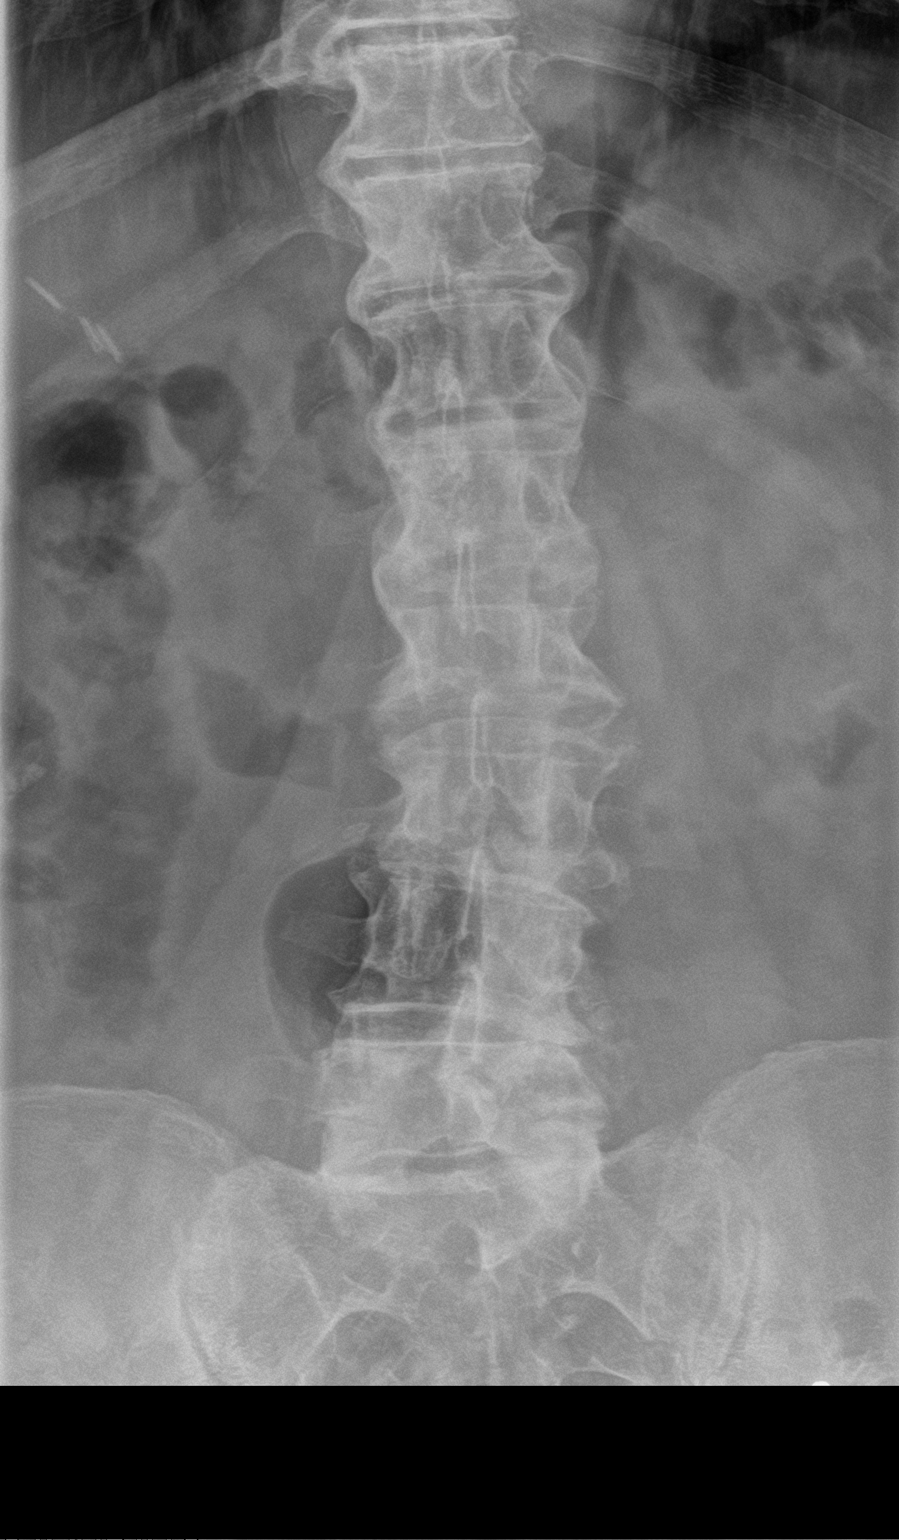

[l-spine lat]
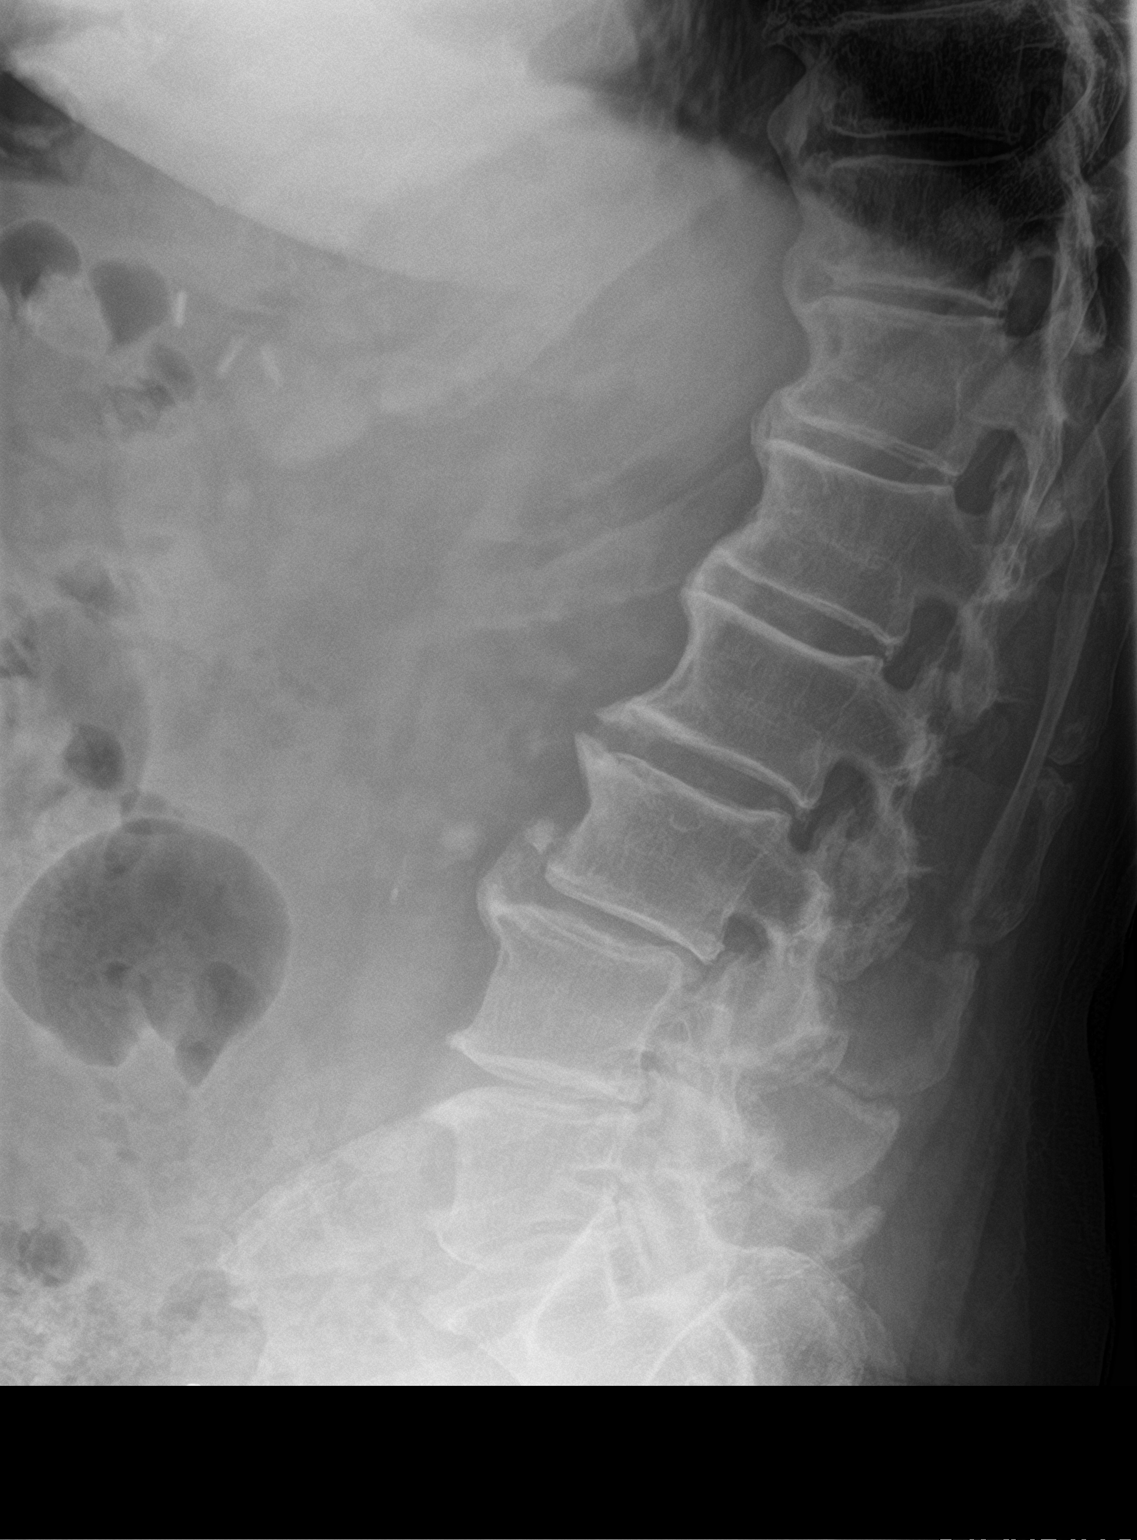

[2 of 2 positions shown; findings below may reference images not displayed]

FINDINGS: There is minimal levocurvature centered at L2-3 which is stable. The
lumbar vertebral bodies are preserved in height. There is moderate
disc space narrowing at L3-4, L4-5, and L5-S1. There are anterior
bridging osteophytes at T12-L1 and L1-L2 with near bridging
osteophytes at L2-3 and L3-4. There is minimal anterolisthesis of L4
with respect L3. There is facet joint hypertrophy at L4-5 and L5-S1.
The pedicles and transverse processes are intact where visualized.
The observed portions of the sacrum are normal.
IMPRESSION: Moderate degenerative disc disease at multiple levels. Minimal grade
1 anterolisthesis of L4 with respect L3 likely on the basis of
degenerative disc disease. The pedicles are intact.

## 2018-11-19 IMAGING — DX DG SPINE 1V PORT
1 series · 1 of 1 positions shown · non-contrast
Comparison: Earlier today

CLINICAL DATA: Intraoperative images for micro lumbar
decompression.

EXAM:
PORTABLE SPINE - 1 VIEW

[l-spine lat]
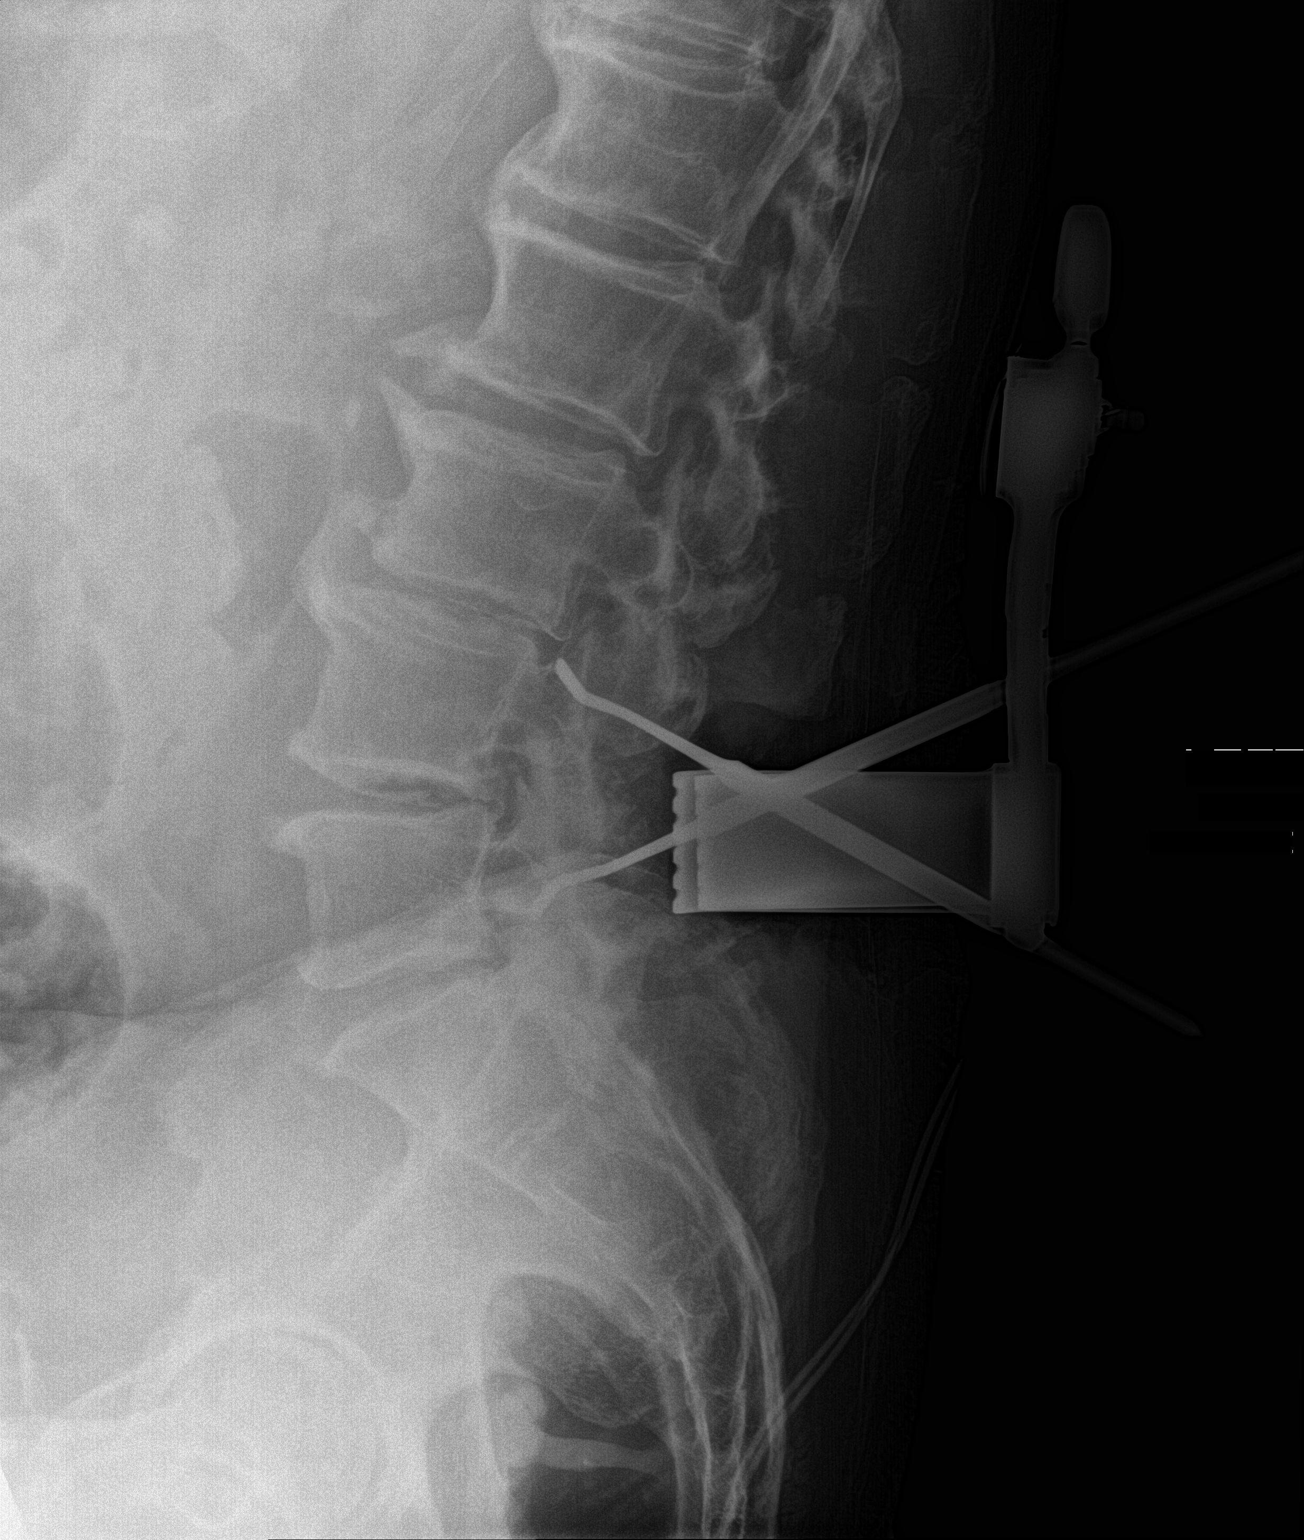

[1 of 1 positions shown; findings below may reference images not displayed]

FINDINGS: Lateral portable radiograph of the lumbar spine obtained in the
operating room was annotated using the same numbering scheme as the
previous radiograph. Tissue spreaders are identified posterior to
the L4-5 disc space. There is a surgical probe overlying the pedicle
of L4 and a second probe overlying the pedicle of L5. Multi level
disc space narrowing and ventral spurring is identified.
IMPRESSION: 1. Intraoperative radiographs have been annotated using the same
numbering scheme as previously.
2. Surgical probes localize the posterior elements of L4 and L5.

## 2018-11-19 IMAGING — DX DG SPINE 1V PORT
1 series · 1 of 1 positions shown · non-contrast
Comparison: 05/12/2016.

CLINICAL DATA: Spine surgery.

EXAM:
PORTABLE SPINE - 1 VIEW

[l-spine lat]
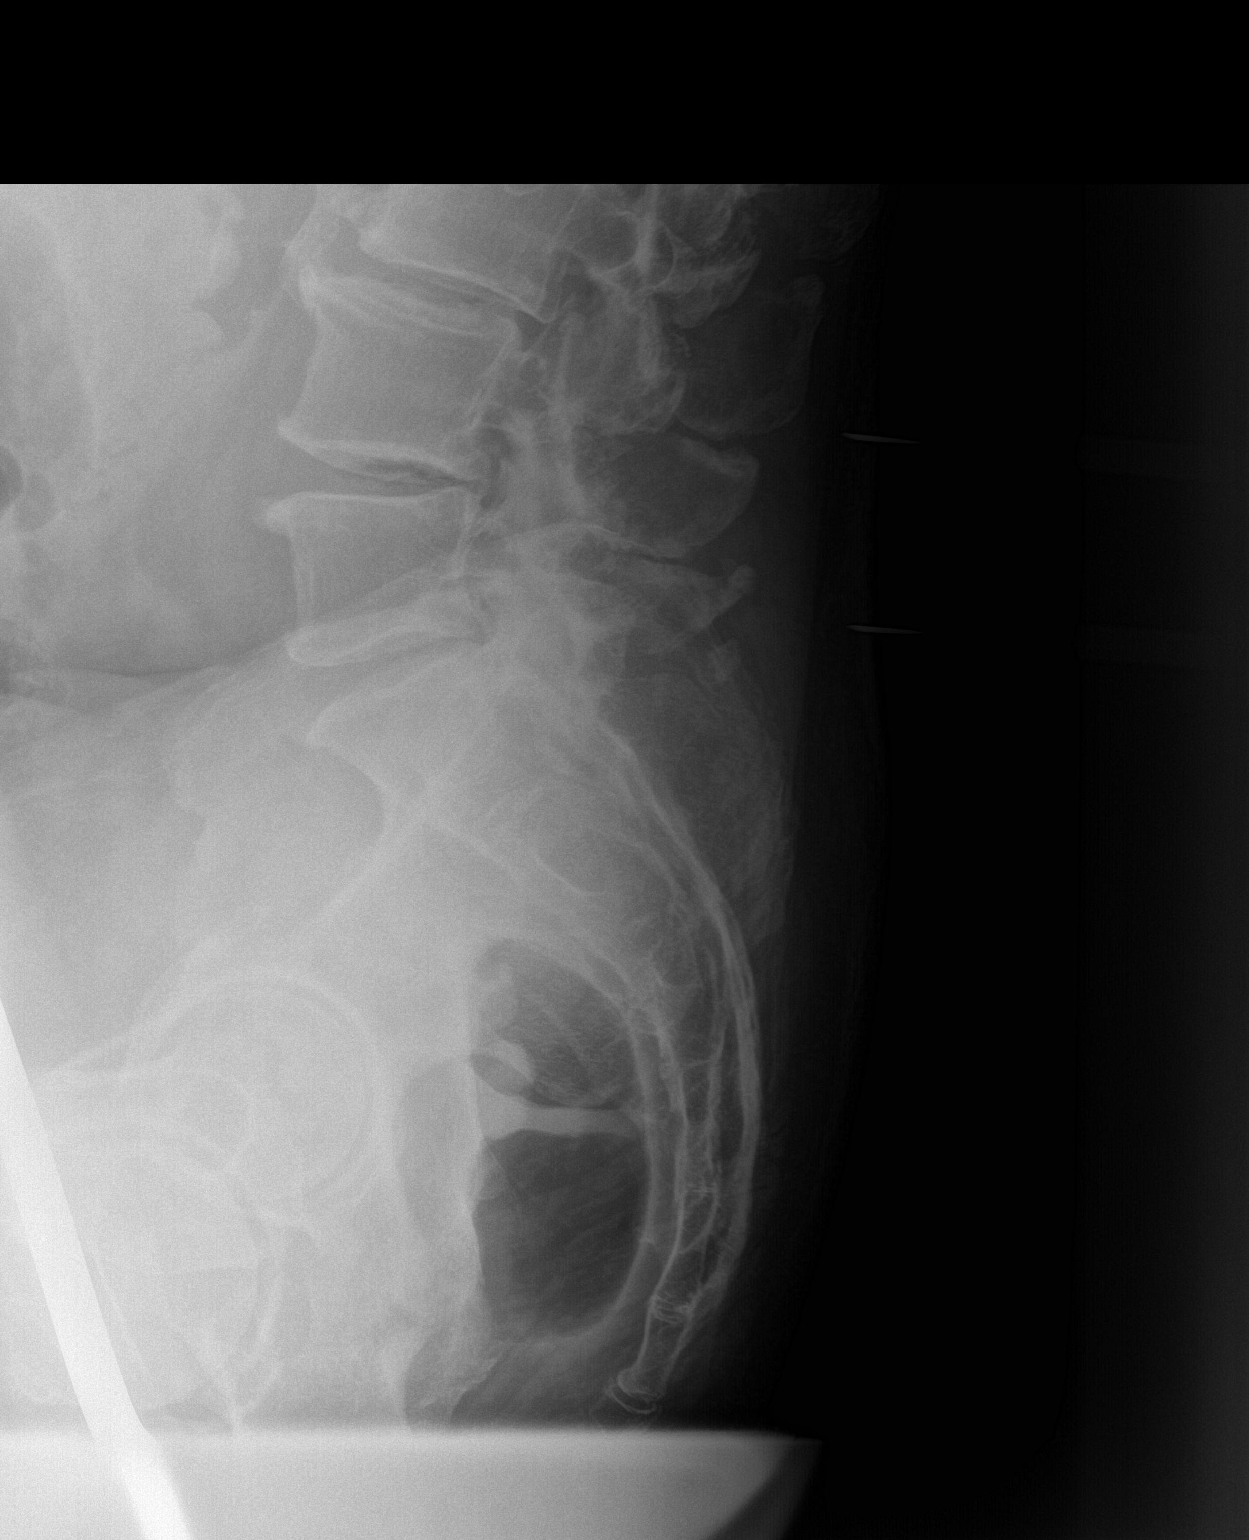

[1 of 1 positions shown; findings below may reference images not displayed]

FINDINGS: Lumbar spine numbered as per prior exam. Metallic markers are noted
posteriorly at the L4 and L5 levels.
IMPRESSION: Metallic markers are noted posteriorly at the L4 and L5 levels.

## 2018-11-28 ENCOUNTER — Ambulatory Visit (INDEPENDENT_AMBULATORY_CARE_PROVIDER_SITE_OTHER): Payer: Medicare Other | Admitting: Physician Assistant

## 2018-11-28 ENCOUNTER — Other Ambulatory Visit: Payer: Self-pay | Admitting: Physician Assistant

## 2018-11-28 ENCOUNTER — Other Ambulatory Visit: Payer: Self-pay

## 2018-11-28 ENCOUNTER — Encounter: Payer: Self-pay | Admitting: Physician Assistant

## 2018-11-28 VITALS — Temp 98.4°F | Wt 245.0 lb

## 2018-11-28 DIAGNOSIS — K219 Gastro-esophageal reflux disease without esophagitis: Secondary | ICD-10-CM | POA: Diagnosis not present

## 2018-11-28 MED ORDER — NIZATIDINE 150 MG PO CAPS: 150.0000 mg | ORAL_CAPSULE | Freq: Every day | ORAL | 1 refills | Status: DC

## 2018-11-28 NOTE — Progress Notes (Signed)
Virtual Visit via Video   I connected with patient on 11/28/18 at  4:00 PM EST by a video enabled telemedicine application and verified that I am speaking with the correct person using two identifiers.  Location patient: Home Location provider: [facility], Office Persons participating in the virtual visit: Patient, Provider, [place] (Patina Moore)  I discussed the limitations of evaluation and management by telemedicine and the availability of in person appointments. The patient expressed understanding and agreed to proceed.  Subjective:   HPI:   Patient with history of GERD presents via [facility] today c/o exacerbation of reflux symptoms x 2 days. Endorses burning sensation with reflux along with burping and bloating. Denies abdominal pain, nausea or vomiting. Denies recent change in medication.  Denies diarrhea, constipation, melena, hematochezia or tenesmus.  Some increase stress with 2020 but no major anxiety.  Denies change in medications. No NSAID use. Denies alcohol consumption.  Has been eating a lot of homemade Chex mix recently and wonders if some of the seasoning may be triggering his symptoms.  Patient is taking his Protonix 40 mg twice daily as prescribed.  Also taking famotidine 20 mg once daily.  Does not feel the famotidine helps.  Felt symptoms were much better controlled when he could take ranitidine, which has been taken off the market.    ROS:   See pertinent positives and negatives per HPI.  Patient Active Problem List   Diagnosis Date Noted  . HNP (herniated nucleus pulposus), lumbar 05/19/2017  . Spinal stenosis of lumbar region 05/19/2016  . Physical exam 12/18/2013  . Abnormal CXR 08/30/2013  . AB (asthmatic bronchitis) 08/30/2013  . Allergic rhinitis 08/30/2013  . RBC microcytosis 12/26/2012  . Chest tightness 01/14/2012  . Well controlled type 2 diabetes mellitus ([place]) 11/24/2011  . Morbid obesity ([place]) 04/07/2010  . VARICOSE VEINS, LOWER  EXTREMITIES 09/20/2008  . VENOUS INSUFFICIENCY 09/20/2008  . DEGENERATIVE JOINT DISEASE 09/20/2008  . HYPERSOMNIA 05/30/2008  . SYNCOPE 05/20/2008  . DIVERTICULOSIS OF COLON 04/16/2007  . CHOLELITHIASIS 04/16/2007  . History of DVT of lower extremity 03/29/2007  . HYPERTENSION, BENIGN 02/22/2007  . COLONIC POLYPS 01/27/2007  . Hyperlipidemia 01/27/2007  . ANXIETY 01/27/2007  . HEMORRHOIDS 01/27/2007  . NEPHROLITHIASIS 01/27/2007  . BENIGN PROSTATIC HYPERTROPHY, WITH OBSTRUCTION 01/27/2007  . LOW BACK PAIN SYNDROME 01/27/2007  . G E R D 01/19/2007    Social History   Tobacco Use  . Smoking status: Never Smoker  . Smokeless tobacco: Never Used  Substance Use Topics  . Alcohol use: Yes    Comment: social  use    Current Outpatient Medications:  .  alfuzosin (UROXATRAL) 10 MG 24 hr tablet, Take 10 mg by mouth daily with breakfast., Disp: , Rfl:  .  amoxicillin (AMOXIL) 500 MG capsule, TAKE 4 CAPSULES BY MOUTH 1 HOUR PRIOR TO APPOINTMENT, Disp: , Rfl:  .  aspirin EC 81 MG tablet, Take 1 tablet (81 mg total) by mouth daily. Resume 4 days post-op, Disp: , Rfl:  .  cetirizine (ZYRTEC) 10 MG tablet, Take 10 mg by mouth daily., Disp: , Rfl:  .  diclofenac (VOLTAREN) 75 MG EC tablet, TAKE 1 TABLET BY MOUTH TWICE A DAY WITH MEALS, Disp: 180 tablet, Rfl: 1 .  docusate sodium (COLACE) 100 MG capsule, Take 1 capsule (100 mg total) by mouth 2 (two) times daily as needed for mild constipation., Disp: 40 capsule, Rfl: 1 .  Fluocinolone Acetonide 0.01 % OIL, Place 5 drops in ear(s) 2 (two)  times daily., Disp: 20 mL, Rfl: 0 .  glimepiride (AMARYL) 2 MG tablet, TAKE 1 TABLET BY MOUTH EVERY DAY BEFORE BREAKFAST, Disp: 90 tablet, Rfl: 1 .  guaiFENesin-codeine (ROBITUSSIN AC) 100-10 MG/5ML syrup, Take 10 mLs by mouth 3 (three) times daily as needed for cough., Disp: 120 mL, Rfl: 0 .  losartan (COZAAR) 100 MG tablet, TAKE 1 TABLET BY MOUTH EVERY DAY, Disp: 90 tablet, Rfl: 1 .  metFORMIN (GLUCOPHAGE)  500 MG tablet, TAKE 1 TABLET (500 MG TOTAL) BY MOUTH 2 (TWO) TIMES DAILY WITH A MEAL., Disp: 180 tablet, Rfl: 0 .  multivitamin (THERAGRAN) per tablet, Take 1 tablet by mouth daily. , Disp: , Rfl:  .  pantoprazole (PROTONIX) 40 MG tablet, TAKE 1 TABLET BY MOUTH TWICE A DAY, Disp: 180 tablet, Rfl: 1 .  polyethylene glycol (MIRALAX / GLYCOLAX) packet, Take 17 g by mouth daily as needed for mild constipation., Disp: 14 each, Rfl: 0 .  simvastatin (ZOCOR) 40 MG tablet, TAKE 1 TABLET BY MOUTH EVERYDAY AT BEDTIME, Disp: 90 tablet, Rfl: 1 .  nizatidine (AXID) 150 MG capsule, Take 1 capsule (150 mg total) by mouth at bedtime., Disp: 30 capsule, Rfl: 1  Allergies  Allergen Reactions  . Ciprofloxacin Rash  . Niacin Other (See Comments)    REACTION: hypotension, shaky    Objective:   Temp 98.4 F (36.9 C) (Temporal)   Wt 245 lb (111.1 kg)   BMI 36.18 kg/m   Patient is well-developed, well-nourished in no acute distress.  Resting comfortably at home.  Head is normocephalic, atraumatic.  No labored breathing.  Speech is clear and coherent with logical content.  Patient is alert and oriented at baseline.   Assessment and Plan:   1. Gastroesophageal reflux disease, unspecified whether esophagitis present 2 days with breakthrough symptoms.  Unhappy with control from famotidine.  Will continue Protonix twice daily.  Will start nizatidine each evening.  Patient also instructed to start a probiotic.  Follow GERD diet.  Stop the Chex mix.  Increase fluids.  Follow-up in 1 week.  Follow-up ASAP for any worsening or new symptoms.  Patient voiced understanding and agreement with plan.    Leeanne Rio, PA-C 11/28/2018

## 2018-11-28 NOTE — Progress Notes (Signed)
I have discussed the procedure for the virtual visit with the patient who has given consent to proceed with assessment and treatment.    S , CMA     

## 2018-11-29 ENCOUNTER — Telehealth: Payer: Self-pay | Admitting: *Deleted

## 2018-11-29 MED ORDER — FAMOTIDINE 40 MG PO TABS: 40.0000 mg | ORAL_TABLET | Freq: Two times a day (BID) | ORAL | 1 refills | Status: DC

## 2018-11-29 NOTE — Telephone Encounter (Signed)
Patient called in to say that the pharmacy called him to let him know that his insurance will not pay for the medication that was called in.  Spoke with provider and he advised me to call in famotidine 40mg  and have patient take twice daily.   I have resent this to the pharmacy and talked with patient to let him know.  He stated verbal understanding.

## 2018-12-04 DIAGNOSIS — R972 Elevated prostate specific antigen [PSA]: Secondary | ICD-10-CM | POA: Diagnosis not present

## 2018-12-08 ENCOUNTER — Encounter: Payer: Self-pay | Admitting: Family Medicine

## 2018-12-08 ENCOUNTER — Other Ambulatory Visit: Payer: Self-pay | Admitting: General Practice

## 2018-12-08 ENCOUNTER — Other Ambulatory Visit: Payer: Self-pay

## 2018-12-08 ENCOUNTER — Ambulatory Visit (INDEPENDENT_AMBULATORY_CARE_PROVIDER_SITE_OTHER): Payer: Medicare Other | Admitting: Family Medicine

## 2018-12-08 VITALS — BP 105/75 | Temp 98.1°F | Ht 70.0 in | Wt 245.0 lb

## 2018-12-08 DIAGNOSIS — N401 Enlarged prostate with lower urinary tract symptoms: Secondary | ICD-10-CM | POA: Diagnosis not present

## 2018-12-08 DIAGNOSIS — R350 Frequency of micturition: Secondary | ICD-10-CM | POA: Diagnosis not present

## 2018-12-08 DIAGNOSIS — K219 Gastro-esophageal reflux disease without esophagitis: Secondary | ICD-10-CM | POA: Diagnosis not present

## 2018-12-08 DIAGNOSIS — N5201 Erectile dysfunction due to arterial insufficiency: Secondary | ICD-10-CM | POA: Diagnosis not present

## 2018-12-08 DIAGNOSIS — E119 Type 2 diabetes mellitus without complications: Secondary | ICD-10-CM

## 2018-12-08 DIAGNOSIS — N312 Flaccid neuropathic bladder, not elsewhere classified: Secondary | ICD-10-CM | POA: Diagnosis not present

## 2018-12-08 DIAGNOSIS — R972 Elevated prostate specific antigen [PSA]: Secondary | ICD-10-CM | POA: Diagnosis not present

## 2018-12-08 DIAGNOSIS — N3000 Acute cystitis without hematuria: Secondary | ICD-10-CM | POA: Diagnosis not present

## 2018-12-08 MED ORDER — ESOMEPRAZOLE MAGNESIUM 40 MG PO CPDR: 40.0000 mg | DELAYED_RELEASE_CAPSULE | Freq: Two times a day (BID) | ORAL | 3 refills | Status: DC

## 2018-12-08 MED ORDER — ONETOUCH ULTRA 2 W/DEVICE KIT: PACK | 1 refills | Status: AC

## 2018-12-08 MED ORDER — ONETOUCH ULTRA VI STRP: ORAL_STRIP | 12 refills | Status: DC

## 2018-12-08 NOTE — Progress Notes (Signed)
I have discussed the procedure for the virtual visit with the patient who has given consent to proceed with assessment and treatment.   Jessica L Brodmerkel, CMA     

## 2018-12-08 NOTE — Progress Notes (Signed)
Virtual Visit via Video   I connected with patient on 12/08/18 at  3:00 PM EST by a video enabled telemedicine application and verified that I am speaking with the correct person using two identifiers.  Location patient: Home Location provider: [facility], Office Persons participating in the virtual visit: Patient, Provider, [place] (Jess B)  I discussed the limitations of evaluation and management by telemedicine and the availability of in person appointments. The patient expressed understanding and agreed to proceed.  Subjective:   HPI:   GERD- pt was seen on 11/24 by Elyn Aquas regarding breakthrough GERD sxs.  He was switched from Famotidine to Nizatidine and continues to take Protonix BID.  Insurance didn't cover the Nizatidine so Famotidine was increased.  Pt reports increased belching, indigestion, heartburn.  'it's very uncomfortable'.  sxs are worse after eating and before bed.  No pain w/ eating.   DM- pt states he needs a new meter.  He is well overdue for DM appt  ROS:   See pertinent positives and negatives per HPI.  Patient Active Problem List   Diagnosis Date Noted  . HNP (herniated nucleus pulposus), lumbar 05/19/2017  . Spinal stenosis of lumbar region 05/19/2016  . Physical exam 12/18/2013  . Abnormal CXR 08/30/2013  . AB (asthmatic bronchitis) 08/30/2013  . Allergic rhinitis 08/30/2013  . RBC microcytosis 12/26/2012  . Chest tightness 01/14/2012  . Well controlled type 2 diabetes mellitus ([place]) 11/24/2011  . Morbid obesity ([place]) 04/07/2010  . VARICOSE VEINS, LOWER EXTREMITIES 09/20/2008  . VENOUS INSUFFICIENCY 09/20/2008  . DEGENERATIVE JOINT DISEASE 09/20/2008  . HYPERSOMNIA 05/30/2008  . SYNCOPE 05/20/2008  . DIVERTICULOSIS OF COLON 04/16/2007  . CHOLELITHIASIS 04/16/2007  . History of DVT of lower extremity 03/29/2007  . HYPERTENSION, BENIGN 02/22/2007  . COLONIC POLYPS 01/27/2007  . Hyperlipidemia 01/27/2007  . ANXIETY 01/27/2007  .  HEMORRHOIDS 01/27/2007  . NEPHROLITHIASIS 01/27/2007  . BENIGN PROSTATIC HYPERTROPHY, WITH OBSTRUCTION 01/27/2007  . LOW BACK PAIN SYNDROME 01/27/2007  . G E R D 01/19/2007    Social History   Tobacco Use  . Smoking status: Never Smoker  . Smokeless tobacco: Never Used  Substance Use Topics  . Alcohol use: Yes    Comment: social  use    Current Outpatient Medications:  .  alfuzosin (UROXATRAL) 10 MG 24 hr tablet, Take 10 mg by mouth daily with breakfast., Disp: , Rfl:  .  aspirin EC 81 MG tablet, Take 1 tablet (81 mg total) by mouth daily. Resume 4 days post-op, Disp: , Rfl:  .  cetirizine (ZYRTEC) 10 MG tablet, Take 10 mg by mouth daily., Disp: , Rfl:  .  diclofenac (VOLTAREN) 75 MG EC tablet, TAKE 1 TABLET BY MOUTH TWICE A DAY WITH MEALS, Disp: 180 tablet, Rfl: 1 .  docusate sodium (COLACE) 100 MG capsule, Take 1 capsule (100 mg total) by mouth 2 (two) times daily as needed for mild constipation., Disp: 40 capsule, Rfl: 1 .  famotidine (PEPCID) 40 MG tablet, Take 1 tablet (40 mg total) by mouth 2 (two) times daily., Disp: 60 tablet, Rfl: 1 .  Fluocinolone Acetonide 0.01 % OIL, Place 5 drops in ear(s) 2 (two) times daily., Disp: 20 mL, Rfl: 0 .  glimepiride (AMARYL) 2 MG tablet, TAKE 1 TABLET BY MOUTH EVERY DAY BEFORE BREAKFAST, Disp: 90 tablet, Rfl: 1 .  losartan (COZAAR) 100 MG tablet, TAKE 1 TABLET BY MOUTH EVERY DAY, Disp: 90 tablet, Rfl: 1 .  metFORMIN (GLUCOPHAGE) 500 MG tablet, TAKE  1 TABLET (500 MG TOTAL) BY MOUTH 2 (TWO) TIMES DAILY WITH A MEAL., Disp: 180 tablet, Rfl: 0 .  multivitamin (THERAGRAN) per tablet, Take 1 tablet by mouth daily. , Disp: , Rfl:  .  pantoprazole (PROTONIX) 40 MG tablet, TAKE 1 TABLET BY MOUTH TWICE A DAY, Disp: 180 tablet, Rfl: 1 .  polyethylene glycol (MIRALAX / GLYCOLAX) packet, Take 17 g by mouth daily as needed for mild constipation., Disp: 14 each, Rfl: 0 .  simvastatin (ZOCOR) 40 MG tablet, TAKE 1 TABLET BY MOUTH EVERYDAY AT BEDTIME, Disp: 90  tablet, Rfl: 1 .  amoxicillin (AMOXIL) 500 MG capsule, TAKE 4 CAPSULES BY MOUTH 1 HOUR PRIOR TO APPOINTMENT, Disp: , Rfl:   Allergies  Allergen Reactions  . Ciprofloxacin Rash  . Niacin Other (See Comments)    REACTION: hypotension, shaky    Objective:   BP 105/75   Temp 98.1 F (36.7 C) (Tympanic)   Ht 5\' 10"  (1.778 m)   Wt 245 lb (111.1 kg)   BMI 35.15 kg/m  AAOx3, NAD NCAT, EOMI No obvious CN deficits Coloring WNL Pt is able to speak clearly, coherently without shortness of breath or increased work of breathing.  Thought process is linear.  Mood is appropriate.   Assessment and Plan:   GERD- deteriorated.  Given his ongoing sxs despite increased famotidine there is concern for H pylori infxn.  Will need to come for lab work.  At this time, will switch from Protonix which he has been on for years to Nexium and see if that makes a difference.  If no improvement and labs are unrevealing will need to see GI.  Pt expressed understanding and is in agreement w/ plan.   DM- well overdue for appt.  New meter was sent in and labs were ordered and pt will need to schedule an OV to discuss.  Pt expressed understanding and is in agreement w/ plan.    Annye Asa, MD 12/08/2018

## 2018-12-18 ENCOUNTER — Encounter: Payer: Self-pay | Admitting: Family Medicine

## 2018-12-18 ENCOUNTER — Ambulatory Visit (INDEPENDENT_AMBULATORY_CARE_PROVIDER_SITE_OTHER): Payer: Medicare Other | Admitting: Family Medicine

## 2018-12-18 ENCOUNTER — Other Ambulatory Visit: Payer: Self-pay

## 2018-12-18 VITALS — BP 124/86 | HR 76 | Temp 97.8°F | Resp 16 | Ht 70.0 in | Wt 230.5 lb

## 2018-12-18 DIAGNOSIS — E119 Type 2 diabetes mellitus without complications: Secondary | ICD-10-CM | POA: Diagnosis not present

## 2018-12-18 DIAGNOSIS — I10 Essential (primary) hypertension: Secondary | ICD-10-CM | POA: Diagnosis not present

## 2018-12-18 DIAGNOSIS — E785 Hyperlipidemia, unspecified: Secondary | ICD-10-CM

## 2018-12-18 DIAGNOSIS — K219 Gastro-esophageal reflux disease without esophagitis: Secondary | ICD-10-CM

## 2018-12-18 LAB — CBC WITH DIFFERENTIAL/PLATELET
Basophils Absolute: 0 10*3/uL (ref 0.0–0.1)
Basophils Relative: 0.2 % (ref 0.0–3.0)
Eosinophils Absolute: 0.3 10*3/uL (ref 0.0–0.7)
Eosinophils Relative: 3.6 % (ref 0.0–5.0)
HCT: 43.9 % (ref 39.0–52.0)
Hemoglobin: 14.5 g/dL (ref 13.0–17.0)
Lymphocytes Relative: 29.4 % (ref 12.0–46.0)
Lymphs Abs: 2.2 10*3/uL (ref 0.7–4.0)
MCHC: 33.2 g/dL (ref 30.0–36.0)
MCV: 92.8 fl (ref 78.0–100.0)
Monocytes Absolute: 0.8 10*3/uL (ref 0.1–1.0)
Monocytes Relative: 10.6 % (ref 3.0–12.0)
Neutro Abs: 4.2 10*3/uL (ref 1.4–7.7)
Neutrophils Relative %: 56.2 % (ref 43.0–77.0)
Platelets: 253 10*3/uL (ref 150.0–400.0)
RBC: 4.73 Mil/uL (ref 4.22–5.81)
RDW: 14.2 % (ref 11.5–15.5)
WBC: 7.5 10*3/uL (ref 4.0–10.5)

## 2018-12-18 LAB — LIPID PANEL
Cholesterol: 97 mg/dL (ref 0–200)
HDL: 36.6 mg/dL — ABNORMAL LOW (ref >39.00)
LDL Cholesterol: 41 mg/dL (ref 0–99)
NonHDL: 60.62
Total CHOL/HDL Ratio: 3
Triglycerides: 98 mg/dL (ref 0.0–149.0)
VLDL: 19.6 mg/dL (ref 0.0–40.0)

## 2018-12-18 LAB — HEPATIC FUNCTION PANEL
ALT: 25 U/L (ref 0–53)
AST: 18 U/L (ref 0–37)
Albumin: 4.1 g/dL (ref 3.5–5.2)
Alkaline Phosphatase: 48 U/L (ref 39–117)
Bilirubin, Direct: 0.2 mg/dL (ref 0.0–0.3)
Total Bilirubin: 0.7 mg/dL (ref 0.2–1.2)
Total Protein: 6.3 g/dL (ref 6.0–8.3)

## 2018-12-18 LAB — BASIC METABOLIC PANEL
BUN: 23 mg/dL (ref 6–23)
CO2: 24 mEq/L (ref 19–32)
Calcium: 9.1 mg/dL (ref 8.4–10.5)
Chloride: 107 mEq/L (ref 96–112)
Creatinine, Ser: 1.04 mg/dL (ref 0.40–1.50)
GFR: 69.81 mL/min (ref >60.00)
Glucose, Bld: 97 mg/dL (ref 70–99)
Potassium: 4.5 mEq/L (ref 3.5–5.1)
Sodium: 140 mEq/L (ref 135–145)

## 2018-12-18 LAB — HEMOGLOBIN A1C: Hgb A1c MFr Bld: 5.7 % (ref 4.6–6.5)

## 2018-12-18 LAB — TSH: TSH: 0.93 u[IU]/mL (ref 0.35–4.50)

## 2018-12-18 MED ORDER — ESOMEPRAZOLE MAGNESIUM 40 MG PO CPDR: 40.0000 mg | DELAYED_RELEASE_CAPSULE | Freq: Two times a day (BID) | ORAL | 1 refills | Status: DC

## 2018-12-18 NOTE — Patient Instructions (Addendum)
Schedule your complete physical and Medicare Wellness Visit in 3-4 months We'll notify you of your lab results and make any changes if needed Since reflux symptoms have improved, try and decrease the Famotidine to once nightly (increase back to twice daily if needed) Continue to work on healthy diet and regular exercise- you can do it! Call with any questions or concerns Stay Safe!  Stay Healthy! Happy Early [place]!!

## 2018-12-18 NOTE — Assessment & Plan Note (Signed)
Improved since switching to Nexium BID w/ Famotidine BID.  Since sxs are improving, will try and decrease Famotidine to QHS.  Check H Pylori as discussed at last visit

## 2018-12-18 NOTE — Progress Notes (Signed)
   Subjective:    Patient ID: [name], male    DOB: 20-Nov-1945, 73 y.o.   MRN: [id number]  HPI DM- chronic problem, on Glimepiride 2mg  daily and Metformin 500mg  BID w/ hx of good control.  Pt has not had A1C in ~1 yr.  UTD on eye exam.  Due for foot exam.  On ARB for renal protection.  No symptomatic lows.  CBG 95 the other night.  No numbness/tingling of hands/feet.  No sores on feet.  HTN- chronic problem, on Losartan 100mg  daily w/ good control.  No CP, SOB, HAs, visual changes, edema.  Hyperlipidemia- chronic problem, on Simvastatin 40mg  daily.  No abd pain w/ exception of GERD.  No N/V  Obesity- pt is down 15 lbs since last visit.  BMI now 33.07.  No regular exercise but reports working in the yard  GERD- reflux 'is better' since switching to Nexium.  Due for H pylori testing today.  Is currently on Nexium twice daily and Famotidine twice daily.   Review of Systems For ROS see HPI   This visit occurred during the SARS-CoV-2 public health emergency.  Safety protocols were in place, including screening questions prior to the visit, additional usage of staff PPE, and extensive cleaning of exam room while observing appropriate contact time as indicated for disinfecting solutions.       Objective:   Physical Exam Vitals reviewed.  Constitutional:      General: He is not in acute distress.    Appearance: He is well-developed.  HENT:     Head: Normocephalic and atraumatic.  Eyes:     Conjunctiva/sclera: Conjunctivae normal.     Pupils: Pupils are equal, round, and reactive to light.  Neck:     Thyroid: No thyromegaly.  Cardiovascular:     Rate and Rhythm: Normal rate and regular rhythm.     Heart sounds: Normal heart sounds. No murmur.  Pulmonary:     Effort: Pulmonary effort is normal. No respiratory distress.     Breath sounds: Normal breath sounds.  Abdominal:     General: Bowel sounds are normal. There is no distension.     Palpations: Abdomen is soft.   Musculoskeletal:     Cervical back: Normal range of motion and neck supple.  Lymphadenopathy:     Cervical: No cervical adenopathy.  Skin:    General: Skin is warm and dry.  Neurological:     Mental Status: He is alert and oriented to person, place, and time.     Cranial Nerves: No cranial nerve deficit.  Psychiatric:        Behavior: Behavior normal.           Assessment & Plan:

## 2018-12-18 NOTE — Assessment & Plan Note (Signed)
Chronic problem, well controlled on Losartan 100mg  daily.  Currently asymptomatic.  Check labs.  No anticipated med changes.

## 2018-12-18 NOTE — Assessment & Plan Note (Signed)
Chronic problem.  Hx of good control.  UTD on eye exam.  On ARB for renal protection.  Check labs.  Adjust meds prn

## 2018-12-18 NOTE — Assessment & Plan Note (Signed)
Pt has lost 15 lbs since last visit.  Encouraged him to continue healthy diet and regular exercise.  Will continue to follow.

## 2018-12-18 NOTE — Assessment & Plan Note (Signed)
Chronic problem.  Tolerating statin w/o difficulty.  Check labs.  Adjust meds prn  

## 2018-12-19 ENCOUNTER — Telehealth: Payer: Self-pay | Admitting: Family Medicine

## 2018-12-19 NOTE — Telephone Encounter (Signed)
Pt called in asking for the lab results, pt can be reached at the home #

## 2018-12-20 LAB — H PYLORI, IGM, IGG, IGA AB
H pylori, IgM Abs: <9 units (ref 0.0–8.9)
H. pylori, IgA Abs: <9 units (ref 0.0–8.9)
H. pylori, IgG AbS: 0.21 Index Value (ref 0.00–0.79)

## 2018-12-20 NOTE — Telephone Encounter (Signed)
Called and advised pt of preliminary results.

## 2018-12-20 NOTE — Telephone Encounter (Signed)
Have we received the H.Pylori yet?

## 2018-12-20 NOTE — Telephone Encounter (Signed)
Preliminary results are negative.  Final results are not yet available

## 2018-12-21 ENCOUNTER — Other Ambulatory Visit: Payer: Self-pay | Admitting: Physician Assistant

## 2019-01-05 ENCOUNTER — Other Ambulatory Visit: Payer: Self-pay | Admitting: Family Medicine

## 2019-01-25 ENCOUNTER — Other Ambulatory Visit: Payer: Self-pay | Admitting: Family Medicine

## 2019-01-31 ENCOUNTER — Ambulatory Visit: Payer: Medicare Other

## 2019-02-01 ENCOUNTER — Ambulatory Visit: Payer: Medicare Other

## 2019-02-02 ENCOUNTER — Other Ambulatory Visit: Payer: Self-pay | Admitting: Family Medicine

## 2019-02-09 ENCOUNTER — Ambulatory Visit: Payer: Medicare Other | Attending: Internal Medicine

## 2019-02-09 DIAGNOSIS — Z23 Encounter for immunization: Secondary | ICD-10-CM | POA: Insufficient documentation

## 2019-02-09 NOTE — Progress Notes (Signed)
   Covid-19 Vaccination Clinic  Name:  [name]    MRN: [id number] DOB: [birth year]  02/09/2019  [name] was observed post Covid-19 immunization for 15 minutes without incidence. He was provided with Vaccine Information Sheet and instruction to access the V-Safe system.   [name] was instructed to call 911 with any severe reactions post vaccine: [name] Difficulty breathing  . Swelling of your face and throat  . A fast heartbeat  . A bad rash all over your body  . Dizziness and weakness    Immunizations Administered    Name Date Dose VIS Date Route   Pfizer COVID-19 Vaccine 02/09/2019  9:07 AM 0.3 mL 12/15/2018 Intramuscular   Manufacturer: [place]   Lot: [id number]   [place]: [id number]

## 2019-03-06 ENCOUNTER — Ambulatory Visit: Payer: Medicare Other | Attending: Internal Medicine

## 2019-03-06 DIAGNOSIS — Z23 Encounter for immunization: Secondary | ICD-10-CM | POA: Insufficient documentation

## 2019-03-06 NOTE — Progress Notes (Signed)
   Covid-19 Vaccination Clinic  Name:  [name]    MRN: [id number] DOB: [birth year]  03/06/2019  [name] was observed post Covid-19 immunization for 15 minutes without incident. He was provided with Vaccine Information Sheet and instruction to access the V-Safe system.   [name] was instructed to call 911 with any severe reactions post vaccine: [name] Difficulty breathing  . Swelling of face and throat  . A fast heartbeat  . A bad rash all over body  . Dizziness and weakness   Immunizations Administered    Name Date Dose VIS Date Route   Pfizer COVID-19 Vaccine 03/06/2019  9:18 AM 0.3 mL 12/15/2018 Intramuscular   Manufacturer: [place]   Lot: [id number]   [place]: [id number]

## 2019-03-23 ENCOUNTER — Other Ambulatory Visit: Payer: Self-pay | Admitting: Family Medicine

## 2019-04-03 ENCOUNTER — Telehealth: Payer: Self-pay | Admitting: Family Medicine

## 2019-04-03 NOTE — Progress Notes (Signed)
  Chronic Care Management   Outreach Note  04/03/2019 Name: [name] MRN: [id number] DOB: 1945-08-27  Referred by: Midge Minium, MD Reason for referral : No chief complaint on file.   An unsuccessful telephone outreach was attempted today. The patient was referred to the pharmacist for assistance with care management and care coordination.   Follow Up Plan:   Earney [place] [facility] Scheduler

## 2019-04-11 ENCOUNTER — Encounter: Payer: Self-pay | Admitting: Family Medicine

## 2019-04-11 ENCOUNTER — Other Ambulatory Visit: Payer: Self-pay

## 2019-04-11 ENCOUNTER — Ambulatory Visit (INDEPENDENT_AMBULATORY_CARE_PROVIDER_SITE_OTHER): Payer: Medicare Other

## 2019-04-11 ENCOUNTER — Ambulatory Visit (INDEPENDENT_AMBULATORY_CARE_PROVIDER_SITE_OTHER): Payer: Medicare Other | Admitting: Family Medicine

## 2019-04-11 VITALS — BP 128/74 | HR 82 | Temp 98.7°F | Ht 70.0 in | Wt 231.8 lb

## 2019-04-11 DIAGNOSIS — Z0000 Encounter for general adult medical examination without abnormal findings: Secondary | ICD-10-CM

## 2019-04-11 DIAGNOSIS — K219 Gastro-esophageal reflux disease without esophagitis: Secondary | ICD-10-CM

## 2019-04-11 DIAGNOSIS — E119 Type 2 diabetes mellitus without complications: Secondary | ICD-10-CM

## 2019-04-11 DIAGNOSIS — E785 Hyperlipidemia, unspecified: Secondary | ICD-10-CM

## 2019-04-11 DIAGNOSIS — I10 Essential (primary) hypertension: Secondary | ICD-10-CM

## 2019-04-11 LAB — CBC WITH DIFFERENTIAL/PLATELET
Basophils Absolute: 0 10*3/uL (ref 0.0–0.1)
Basophils Relative: 0.2 % (ref 0.0–3.0)
Eosinophils Absolute: 0.2 10*3/uL (ref 0.0–0.7)
Eosinophils Relative: 2.2 % (ref 0.0–5.0)
HCT: 43.4 % (ref 39.0–52.0)
Hemoglobin: 14.5 g/dL (ref 13.0–17.0)
Lymphocytes Relative: 25.6 % (ref 12.0–46.0)
Lymphs Abs: 2.2 10*3/uL (ref 0.7–4.0)
MCHC: 33.3 g/dL (ref 30.0–36.0)
MCV: 93.8 fl (ref 78.0–100.0)
Monocytes Absolute: 0.9 10*3/uL (ref 0.1–1.0)
Monocytes Relative: 10.1 % (ref 3.0–12.0)
Neutro Abs: 5.3 10*3/uL (ref 1.4–7.7)
Neutrophils Relative %: 61.9 % (ref 43.0–77.0)
Platelets: 247 10*3/uL (ref 150.0–400.0)
RBC: 4.63 Mil/uL (ref 4.22–5.81)
RDW: 13.8 % (ref 11.5–15.5)
WBC: 8.5 10*3/uL (ref 4.0–10.5)

## 2019-04-11 LAB — HEPATIC FUNCTION PANEL
ALT: 28 U/L (ref 0–53)
AST: 19 U/L (ref 0–37)
Albumin: 4.2 g/dL (ref 3.5–5.2)
Alkaline Phosphatase: 41 U/L (ref 39–117)
Bilirubin, Direct: 0.1 mg/dL (ref 0.0–0.3)
Total Bilirubin: 0.5 mg/dL (ref 0.2–1.2)
Total Protein: 6.3 g/dL (ref 6.0–8.3)

## 2019-04-11 LAB — BASIC METABOLIC PANEL
BUN: 22 mg/dL (ref 6–23)
CO2: 23 mEq/L (ref 19–32)
Calcium: 9 mg/dL (ref 8.4–10.5)
Chloride: 112 mEq/L (ref 96–112)
Creatinine, Ser: 1 mg/dL (ref 0.40–1.50)
GFR: 72.98 mL/min (ref >60.00)
Glucose, Bld: 95 mg/dL (ref 70–99)
Potassium: 4.4 mEq/L (ref 3.5–5.1)
Sodium: 144 mEq/L (ref 135–145)

## 2019-04-11 LAB — LIPID PANEL
Cholesterol: 105 mg/dL (ref 0–200)
HDL: 38.4 mg/dL — ABNORMAL LOW (ref >39.00)
LDL Cholesterol: 46 mg/dL (ref 0–99)
NonHDL: 66.29
Total CHOL/HDL Ratio: 3
Triglycerides: 99 mg/dL (ref 0.0–149.0)
VLDL: 19.8 mg/dL (ref 0.0–40.0)

## 2019-04-11 LAB — HEMOGLOBIN A1C: Hgb A1c MFr Bld: 5.7 % (ref 4.6–6.5)

## 2019-04-11 LAB — TSH: TSH: 0.78 u[IU]/mL (ref 0.35–4.50)

## 2019-04-11 MED ORDER — SIMVASTATIN 40 MG PO TABS: ORAL_TABLET | ORAL | 1 refills | Status: DC

## 2019-04-11 MED ORDER — METFORMIN HCL 500 MG PO TABS: 500.0000 mg | ORAL_TABLET | Freq: Two times a day (BID) | ORAL | 0 refills | Status: DC

## 2019-04-11 MED ORDER — GLIMEPIRIDE 2 MG PO TABS: ORAL_TABLET | ORAL | 1 refills | Status: DC

## 2019-04-11 NOTE — Assessment & Plan Note (Signed)
Chronic problem.  Hx of good control.  Asymptomatic.  UTD on foot exam, eye exam.  On ARB for renal protection.  Check labs.  Adjust meds prn

## 2019-04-11 NOTE — Assessment & Plan Note (Signed)
Chronic problem, well controlled today.  Asymptomatic.  Check labs.  No anticipated med changes.  Will follow. 

## 2019-04-11 NOTE — Patient Instructions (Addendum)
Follow up in 6 months to recheck diabetes, BP, cholesterol We'll notify you of your lab results and make any changes if needed Continue to work on healthy diet and regular exercise- you can do it! Increase Famotidine to twice daily Call with any questions or concerns Stay Safe!  Stay Healthy! Enjoy the beach!!!

## 2019-04-11 NOTE — Assessment & Plan Note (Signed)
Chronic problem.  Tolerating statin w/o difficulty.  Check labs.  Adjust meds prn  

## 2019-04-11 NOTE — Progress Notes (Signed)
   Subjective:    Patient ID: [name], male    DOB: 1945/04/23, 74 y.o.   MRN: [id number]  HPI DM- chronic problem, on Amaryl 2mg  daily, Metformin 500mg  BID.  Hx of good control.  On ARB for renal protection.  UTD on eye exam, foot exam.  Has not been going to the gym due to [place].  Rare symptomatic lows.  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  No CP, SOB, HAs, visual changes, edema.  Hyperlipidemia- chronic problem, on Simvastatin 40mg  daily.  Weight is stable.  Denies abd pain, N/V.  GERD- having breakthrough sxs on Famotidine once daily and Nexium once daily.  Was previously on both BID.     Review of Systems For ROS see HPI   This visit occurred during the SARS-CoV-2 public health emergency.  Safety protocols were in place, including screening questions prior to the visit, additional usage of staff PPE, and extensive cleaning of exam room while observing appropriate contact time as indicated for disinfecting solutions.       Objective:   Physical Exam Vitals reviewed.  Constitutional:      General: He is not in acute distress.    Appearance: He is well-developed. He is obese.  HENT:     Head: Normocephalic and atraumatic.  Eyes:     Conjunctiva/sclera: Conjunctivae normal.     Pupils: Pupils are equal, round, and reactive to light.  Neck:     Thyroid: No thyromegaly.  Cardiovascular:     Rate and Rhythm: Normal rate and regular rhythm.     Heart sounds: Normal heart sounds. No murmur.  Pulmonary:     Effort: Pulmonary effort is normal. No respiratory distress.     Breath sounds: Normal breath sounds.  Abdominal:     General: Bowel sounds are normal. There is no distension.     Palpations: Abdomen is soft.  Musculoskeletal:     Cervical back: Normal range of motion and neck supple.  Lymphadenopathy:     Cervical: No cervical adenopathy.  Skin:    General: Skin is warm and dry.   Neurological:     Mental Status: He is alert and oriented to person, place, and time.     Cranial Nerves: No cranial nerve deficit.  Psychiatric:        Behavior: Behavior normal.           Assessment & Plan:

## 2019-04-11 NOTE — Assessment & Plan Note (Signed)
Deteriorated.  Will increase Famotidine to BID and continue daily Nexium.  Pt expressed understanding and is in agreement w/ plan.

## 2019-04-11 NOTE — Progress Notes (Addendum)
Subjective:   [name] is a 74 y.o. male who presents for Medicare Annual/Subsequent preventive examination.  Review of Systems:   Cardiac Risk Factors include: advanced age (>50mn, >>26women);male gender;hypertension;dyslipidemia;diabetes mellitus    Objective:    Vitals: BP 128/74   Pulse 82   Temp 98.7 F (37.1 C) (Temporal)   Ht _0  (1.778 m)   Wt 231 lb 12.8 oz (105.1 kg)   SpO2 94%   BMI 33.26 kg/m   Body mass index is 33.26 kg/m.  Advanced Directives 04/11/2019 01/26/2018 05/13/2017 01/25/2017 05/19/2016 05/12/2016 03/07/2012  Does Patient Have a Medical Advance Directive? _1  Yes Patient has advance directive, copy not in chart  Type of Advance Directive Living will;Healthcare Power of Attorney Living will;Healthcare Power of Attorney Living will [place] will [place] Olive[place] will -  Does patient want to make changes to medical advance directive? No - Patient declined - No - Patient declined - No - Patient declined No - Patient declined -  Copy of [place] Chart? No - copy requested No - copy requested - No - copy requested No - copy requested - Copy requested from other (Comment)  Pre-existing out of facility DNR order (yellow form or pink MOST form) - - - - - - -    Tobacco Social History   Tobacco Use  Smoking Status Never Smoker  Smokeless Tobacco Never Used     Counseling given: Not Answered   Clinical Intake:  Pre-visit preparation completed: Yes  Pain : No/denies pain  Diabetes: Yes CBG done?: No Did pt. bring in CBG monitor from home?: No  How often do you need to have someone help you when you read instructions, pamphlets, or other written materials from your doctor or pharmacy?: 1 - Never  Interpreter Needed?: No  Information entered by :: CDenman GeorgeLPN  Past Medical History:  Diagnosis Date  . Anxiety   .  BPH (benign prostatic hypertrophy) with urinary obstruction   . Cholelithiasis   . Clotting disorder ([place]   . Complication of anesthesia    urinary retention ; "bladder didnt wake up"; says last gallbladder surgery he shouldve been cathed with his med hx  . Diabetes mellitus    Type II  . Diverticulosis of colon   . DJD (degenerative joint disease)   . GERD (gastroesophageal reflux disease)   . Hemorrhoids   . History of colonic polyps   . History of DVT (deep vein thrombosis)   . History of kidney stones   . Hyperlipidemia   . Hypertension, benign   . Low back pain syndrome   . Nephrolithiasis   . pulmonary nodule, left lower lobe   . Spinal stenosis   . Varicose veins of lower extremities   . Venous insufficiency    Past Surgical History:  Procedure Laterality Date  . APPENDECTOMY    . bilateral inguinal hernia repairs    . CERVICAL DISC ARTHROPLASTY    . CHOLECYSTECTOMY  03/07/2012  . CHOLECYSTECTOMY N/A 03/07/2012   Procedure: LAPAROSCOPIC CHOLECYSTECTOMY;  Surgeon: MRolm Bookbinder MD;  [facility]  Service: General;  Laterality: N/A;  . HERNIA REPAIR     ventral hernia with mesh 1990s  . JOINT REPLACEMENT    . left ankle surgery with plate and screws  [id number]  by Dr. DSharol Given . LUMBAR LAMINECTOMY/DECOMPRESSION MICRODISCECTOMY N/A 05/19/2016  Procedure: Microlumbar decompression L3-4, L4-5;  Surgeon: Susa Day, MD;  Location: [facility];  Service: Orthopedics;  Laterality: N/A;  . LUMBAR LAMINECTOMY/DECOMPRESSION MICRODISCECTOMY N/A 05/19/2017   Procedure: Microlumbar decompression Lumbar five-Sacral one;  Surgeon: Susa Day, MD;  Location: [facility];  Service: Orthopedics;  Laterality: N/A;  . sebaceous cyst surgery    . t1-t2 disc surgery  2007   by Dr. Louanne Skye  . TONSILLECTOMY    . TOTAL KNEE ARTHROPLASTY     right and left   Family History  Problem Relation Age of Onset  . Gout Father   . Coronary artery disease Father 27       died of massive MI age 84   . Heart attack Father   . Diabetes Mother   . CAD Brother 15   Social History   Socioeconomic History  . Marital status: Married    Spouse name: Not on file  . Number of children: 2  . Years of education: Not on file  . Highest education level: Not on file  Occupational History  . Not on file  Tobacco Use  . Smoking status: Never Smoker  . Smokeless tobacco: Never Used  Substance and Sexual Activity  . Alcohol use: Yes    Comment: social  use  . Drug use: No  . Sexual activity: Not on file  Other Topics Concern  . Not on file  Social History Narrative   Retired.   Social Determinants of Health   Financial Resource Strain:   . Difficulty of Paying Living Expenses:   Food Insecurity:   . Worried About Charity fundraiser in the Last Year:   . Arboriculturist in the Last Year:   Transportation Needs:   . Film/video editor (Medical):   [name] Lack of Transportation (Non-Medical):   Physical Activity:   . Days of Exercise per Week:   . Minutes of Exercise per Session:   Stress:   . Feeling of Stress :   Social Connections:   . Frequency of Communication with Friends and Family:   . Frequency of Social Gatherings with Friends and Family:   . Attends Religious Services:   . Active Member of Clubs or Organizations:   . Attends Archivist Meetings:   [name] Marital Status:     Outpatient Encounter Medications as of 04/11/2019  Medication Sig  . alfuzosin (UROXATRAL) 10 MG 24 hr tablet Take 10 mg by mouth daily with breakfast.  . amoxicillin (AMOXIL) 500 MG capsule TAKE 4 CAPSULES BY MOUTH 1 HOUR PRIOR TO APPOINTMENT  . aspirin EC 81 MG tablet Take 1 tablet (81 mg total) by mouth daily. Resume 4 days post-op  . Blood Glucose Monitoring Suppl (ONE TOUCH ULTRA 2) w/Device KIT Use as directed to test sugars 1-2 times daily. Dx E11.9  . cetirizine (ZYRTEC) 10 MG tablet Take 10 mg by mouth daily.  . diclofenac (VOLTAREN) 75 MG EC tablet TAKE 1 TABLET BY MOUTH TWICE A  DAY WITH MEALS  . docusate sodium (COLACE) 100 MG capsule Take 1 capsule (100 mg total) by mouth 2 (two) times daily as needed for mild constipation.  [name] esomeprazole (NEXIUM) 40 MG capsule Take 1 capsule (40 mg total) by mouth 2 (two) times daily before a meal.  . famotidine (PEPCID) 40 MG tablet TAKE 1 TABLET BY MOUTH TWICE A DAY  . Fluocinolone Acetonide 0.01 % OIL Place 5 drops in ear(s) 2 (two) times daily.  [name] glimepiride (AMARYL)  2 MG tablet TAKE 1 TABLET BY MOUTH EVERY DAY BEFORE BREAKFAST  . glucose blood (ONETOUCH ULTRA) test strip Use as instructed to test sugars 1-2 times daily. Dx. E11.9  . losartan (COZAAR) 100 MG tablet TAKE 1 TABLET BY MOUTH EVERY DAY  . metFORMIN (GLUCOPHAGE) 500 MG tablet Take 1 tablet (500 mg total) by mouth 2 (two) times daily with a meal.  . multivitamin (THERAGRAN) per tablet Take 1 tablet by mouth daily.   . simvastatin (ZOCOR) 40 MG tablet Take 1 tablet in the evening daily  . [DISCONTINUED] glimepiride (AMARYL) 2 MG tablet TAKE 1 TABLET BY MOUTH EVERY DAY BEFORE BREAKFAST  . [DISCONTINUED] metFORMIN (GLUCOPHAGE) 500 MG tablet TAKE 1 TABLET (500 MG TOTAL) BY MOUTH 2 (TWO) TIMES DAILY WITH A MEAL.  . [DISCONTINUED] simvastatin (ZOCOR) 40 MG tablet TAKE 1 TABLET BY MOUTH EVERYDAY AT BEDTIME  . [DISCONTINUED] clindamycin (CLEOCIN T) 1 % lotion APPLY TO AFFECTED AREA ON THE SKIN IN THE MORNING ON CHEST  . [DISCONTINUED] famotidine (PEPCID) 20 MG tablet TAKE 1 TABLET BY MOUTH TWICE A DAY  . [DISCONTINUED] pantoprazole (PROTONIX) 40 MG tablet TAKE 1 TABLET BY MOUTH TWICE A DAY  . [DISCONTINUED] polyethylene glycol (MIRALAX / GLYCOLAX) packet Take 17 g by mouth daily as needed for mild constipation.   No facility-administered encounter medications on file as of 04/11/2019.    Activities of Daily Living In your present state of health, do you have any difficulty performing the following activities: 04/11/2019 12/18/2018  Hearing? N N  Vision? N N  Difficulty  concentrating or making decisions? N N  Walking or climbing stairs? N N  Dressing or bathing? N N  Doing errands, shopping? N N  Preparing Food and eating ? N -  Using the Toilet? N -  In the past six months, have you accidently leaked urine? N -  Do you have problems with loss of bowel control? N -  Managing your Medications? N -  Managing your Finances? N -  Housekeeping or managing your Housekeeping? N -  Some recent data might be hidden    Patient Care Team: Midge Minium, MD as PCP - General (Family Medicine) Paralee Cancel, MD as Consulting Physician (Orthopedic Surgery) Irene Shipper, MD as Consulting Physician (Gastroenterology) Sharyne Peach, MD as Consulting Physician (Ophthalmology) [facility], Michae Kava, MD as Attending Physician Susa Day, MD as Consulting Physician (Orthopedic Surgery) Rolm Bookbinder, MD as Consulting Physician (Dermatology) [place] (Dentistry)   Assessment:   This is a routine wellness examination for [name].  Exercise Activities and Dietary recommendations Current Exercise Habits: The patient does not participate in regular exercise at present  Goals    . Increase physical activity     Increase activity once cleared by ortho (back surgery).     . Patient Stated     Be able to increase activity by getting pain under control.        Fall Risk Fall Risk  04/11/2019 12/18/2018 12/08/2018 03/22/2018 01/26/2018  Falls in the past year? 0 1 1 0 0  Number falls in past yr: 0 0 0 0 -  Injury with Fall? 0 0 0 0 -  Follow up Falls evaluation completed;Education provided;Falls prevention discussed Falls evaluation completed Falls evaluation completed Falls evaluation completed -   Is the patient's home free of loose throw rugs in walkways, pet beds, electrical cords, etc?   yes      Grab bars in the bathroom? yes  Handrails on the stairs?   yes      Adequate lighting?   yes  Timed Get Up and Go Performed: completed and within  normal timeframe; no gait abnormalities noted   Depression Screen PHQ 2/9 Scores 04/11/2019 12/18/2018 12/08/2018 01/26/2018  PHQ - 2 Score 0 0 0 0  PHQ- 9 Score - - 0 -    Cognitive Function MMSE - Mini Mental State Exam 01/26/2018 01/25/2017  Orientation to time 5 5  Orientation to Place 5 5  Registration 3 3  Attention/ Calculation 3 3  Recall 3 2  Language- name 2 objects 2 2  Language- repeat 1 1  Language- follow 3 step command 3 3  Language- read & follow direction 1 1  Write a sentence 1 1  Copy design 0 1  Total score 27 27     6CIT Screen 04/11/2019  What Year? 0 points  What month? 0 points  What time? 0 points  Count back from 20 0 points  Months in reverse 0 points  Repeat phrase 0 points  Total Score 0    Immunization History  Administered Date(s) Administered  . Fluad Quad(high Dose 65+) 09/20/2018  . H1N1 01/03/2008  . Influenza Split 09/21/2010, 09/14/2011  . Influenza Whole 11/05/2008, 09/15/2009  . Influenza,inj,Quad PF,6+ Mos 10/05/2012, 10/26/2013, 09/27/2014, 09/23/2015, 09/29/2016, 08/30/2017  . Influenza,inj,quad, With Preservative 10/04/2016  . PFIZER SARS-COV-2 Vaccination 02/09/2019, 03/06/2019  . Pneumococcal Conjugate-13 12/18/2013  . Pneumococcal Polysaccharide-23 12/26/2012  . Pneumococcal-Unspecified 01/05/2015  . Tdap 11/22/2008    Qualifies for Shingles Vaccine? Discussed and patient will check with pharmacy for coverage.  Patient education handout provided   Screening Tests Health Maintenance  Topic Date Due  . TETANUS/TDAP  12/08/2019 (Originally 11/23/2018)  . Hepatitis C Screening  12/08/2019 (Originally 12-23-1945)  . COLONOSCOPY  01/03/2020 (Originally 07/24/2018)  . HEMOGLOBIN A1C  06/18/2019  . INFLUENZA VACCINE  08/05/2019  . OPHTHALMOLOGY EXAM  08/15/2019  . FOOT EXAM  12/18/2019  . PNA vac Low Risk Adult  Completed   Cancer Screenings: Lung: Low Dose CT Chest recommended if Age 25-80 years, 30 pack-year currently  smoking OR have quit w/in 15years. Patient does not qualify. Colorectal: last colonoscopy 07/2018; patient will reschedule with provider    Plan:  I have personally reviewed and addressed the Medicare Annual Wellness questionnaire and have noted the following in the patient's chart:  A. Medical and social history B. Use of alcohol, tobacco or illicit drugs  C. Current medications and supplements D. Functional ability and status E.  Nutritional status F.  Physical activity G. Advance directives H. List of other physicians I.  Hospitalizations, surgeries, and ER visits in previous 12 months J.  [place] such as hearing and vision if needed, cognitive and depression L. Referrals, records requested, and appointments- none   In addition, I have reviewed and discussed with patient certain preventive protocols, quality metrics, and best practice recommendations. A written personalized care plan for preventive services as well as general preventive health recommendations were provided to patient.   Signed,  Denman George, LPN  Nurse Health Advisor   Nurse Notes: no additional

## 2019-04-11 NOTE — Patient Instructions (Signed)
[name] , Thank you for taking time to come for your Medicare Wellness Visit. I appreciate your ongoing commitment to your health goals. Please review the following plan we discussed and let me know if I can assist you in the future.   Screening recommendations/referrals: Colorectal Screening: please reschedule as soon as possible   Vision and Dental Exams: Recommended annual ophthalmology exams for early detection of glaucoma and other disorders of the eye Recommended annual dental exams for proper oral hygiene  Diabetic Exams: Diabetic Eye Exam: recommended yearly; up to date Diabetic Foot Exam: recommended yearly; up to date   Vaccinations: Influenza vaccine: completed 09/20/18 Pneumococcal vaccine: up to date; last  Tdap vaccine: recommended; Please call your insurance company to determine your out of pocket expense. You may receive  this vaccine at your local pharmacy or Health Dept. Shingles vaccine:  You may receive this vaccine at your local pharmacy. (see handout)   Advanced directives: Please bring a copy of your POA (Power of Attorney) and/or Living Will to your next appointment.  Goals: Recommend to drink at least 6-8 8oz glasses of water per day and consume a balanced diet rich in fresh fruits and vegetables   Next appointment: Please schedule your Annual Wellness Visit with your Nurse Health Advisor in one year.  Preventive Care 75 Years and Older, Male Preventive care refers to lifestyle choices and visits with your health care provider that can promote health and wellness. What does preventive care include?  A yearly physical exam. This is also called an annual well check.  Dental exams once or twice a year.  Routine eye exams. Ask your health care provider how often you should have your eyes checked.  Personal lifestyle choices, including:  Daily care of your teeth and gums.  Regular physical activity.  Eating a healthy diet.  Avoiding tobacco and drug  use.  Limiting alcohol use.  Practicing safe sex.  Taking low doses of aspirin every day if recommended by your health care provider..  Taking vitamin and mineral supplements as recommended by your health care provider. What happens during an annual well check? The services and screenings done by your health care provider during your annual well check will depend on your age, overall health, lifestyle risk factors, and family history of disease. Counseling  Your health care provider may ask you questions about your:  Alcohol use.  Tobacco use.  Drug use.  Emotional well-being.  Home and relationship well-being.  Sexual activity.  Eating habits.  History of falls.  Memory and ability to understand (cognition).  Work and work Statistician. Screening  You may have the following tests or measurements:  Height, weight, and BMI.  Blood pressure.  Lipid and cholesterol levels. These may be checked every 5 years, or more frequently if you are over 38 years old.  Skin check.  Lung cancer screening. You may have this screening every year starting at age 8 if you have a 30-pack-year history of smoking and currently smoke or have quit within the past 15 years.  Fecal occult blood test (FOBT) of the stool. You may have this test every year starting at age 86.  Flexible sigmoidoscopy or colonoscopy. You may have a sigmoidoscopy every 5 years or a colonoscopy every 10 years starting at age 58.  Prostate cancer screening. Recommendations will vary depending on your family history and other risks.  Hepatitis C blood test.  Hepatitis B blood test.  Sexually transmitted disease (STD) testing.  Diabetes screening.  This is done by checking your blood sugar (glucose) after you have not eaten for a while (fasting). You may have this done every 1-3 years.  Abdominal aortic aneurysm (AAA) screening. You may need this if you are a current or former smoker.  Osteoporosis. You may  be screened starting at age 41 if you are at high risk. Talk with your health care provider about your test results, treatment options, and if necessary, the need for more tests. Vaccines  Your health care provider may recommend certain vaccines, such as:  Influenza vaccine. This is recommended every year.  Tetanus, diphtheria, and acellular pertussis (Tdap, Td) vaccine. You may need a Td booster every 10 years.  Zoster vaccine. You may need this after age 36.  Pneumococcal 13-valent conjugate (PCV13) vaccine. One dose is recommended after age 30.  Pneumococcal polysaccharide (PPSV23) vaccine. One dose is recommended after age 64. Talk to your health care provider about which screenings and vaccines you need and how often you need them. This information is not intended to replace advice given to you by your health care provider. Make sure you discuss any questions you have with your health care provider. Document Released: 01/17/2015 Document Revised: 09/10/2015 Document Reviewed: 10/22/2014 Elsevier Interactive Patient Education  2017 [place] Prevention in the Home Falls can cause injuries. They can happen to people of all ages. There are many things you can do to make your home safe and to help prevent falls. What can I do on the outside of my home?  Regularly fix the edges of walkways and driveways and fix any cracks.  Remove anything that might make you trip as you walk through a door, such as a raised step or threshold.  Trim any bushes or trees on the path to your home.  Use bright outdoor lighting.  Clear any walking paths of anything that might make someone trip, such as rocks or tools.  Regularly check to see if handrails are loose or broken. Make sure that both sides of any steps have handrails.  Any raised decks and porches should have guardrails on the edges.  Have any leaves, snow, or ice cleared regularly.  Use sand or salt on walking paths during  winter.  Clean up any spills in your garage right away. This includes oil or grease spills. What can I do in the bathroom?  Use night lights.  Install grab bars by the toilet and in the tub and shower. Do not use towel bars as grab bars.  Use non-skid mats or decals in the tub or shower.  If you need to sit down in the shower, use a plastic, non-slip stool.  Keep the floor dry. Clean up any water that spills on the floor as soon as it happens.  Remove soap buildup in the tub or shower regularly.  Attach bath mats securely with double-sided non-slip rug tape.  Do not have throw rugs and other things on the floor that can make you trip. What can I do in the bedroom?  Use night lights.  Make sure that you have a light by your bed that is easy to reach.  Do not use any sheets or blankets that are too big for your bed. They should not hang down onto the floor.  Have a firm chair that has side arms. You can use this for support while you get dressed.  Do not have throw rugs and other things on the floor that can make you  trip. What can I do in the kitchen?  Clean up any spills right away.  Avoid walking on wet floors.  Keep items that you use a lot in easy-to-reach places.  If you need to reach something above you, use a strong step stool that has a grab bar.  Keep electrical cords out of the way.  Do not use floor polish or wax that makes floors slippery. If you must use wax, use non-skid floor wax.  Do not have throw rugs and other things on the floor that can make you trip. What can I do with my stairs?  Do not leave any items on the stairs.  Make sure that there are handrails on both sides of the stairs and use them. Fix handrails that are broken or loose. Make sure that handrails are as long as the stairways.  Check any carpeting to make sure that it is firmly attached to the stairs. Fix any carpet that is loose or worn.  Avoid having throw rugs at the top or  bottom of the stairs. If you do have throw rugs, attach them to the floor with carpet tape.  Make sure that you have a light switch at the top of the stairs and the bottom of the stairs. If you do not have them, ask someone to add them for you. What else can I do to help prevent falls?  Wear shoes that:  Do not have high heels.  Have rubber bottoms.  Are comfortable and fit you well.  Are closed at the toe. Do not wear sandals.  If you use a stepladder:  Make sure that it is fully opened. Do not climb a closed stepladder.  Make sure that both sides of the stepladder are locked into place.  Ask someone to hold it for you, if possible.  Clearly mark and make sure that you can see:  Any grab bars or handrails.  First and last steps.  Where the edge of each step is.  Use tools that help you move around (mobility aids) if they are needed. These include:  Canes.  Walkers.  Scooters.  Crutches.  Turn on the lights when you go into a dark area. Replace any light bulbs as soon as they burn out.  Set up your furniture so you have a clear path. Avoid moving your furniture around.  If any of your floors are uneven, fix them.  If there are any pets around you, be aware of where they are.  Review your medicines with your doctor. Some medicines can make you feel dizzy. This can increase your chance of falling. Ask your doctor what other things that you can do to help prevent falls. This information is not intended to replace advice given to you by your health care provider. Make sure you discuss any questions you have with your health care provider. Document Released: 10/17/2008 Document Revised: 05/29/2015 Document Reviewed: 01/25/2014 Elsevier Interactive Patient Education  2017 [employer].

## 2019-04-12 ENCOUNTER — Telehealth: Payer: Self-pay | Admitting: Family Medicine

## 2019-04-12 NOTE — Progress Notes (Signed)
  Chronic Care Management   Note  04/12/2019 Name: [name] MRN: [id number] DOB: [birth year]  [name] is a 74 y.o. year old male who is a primary care patient of Birdie Riddle, Aundra Millet, MD. I reached out to Rosetta Posner by phone today in response to a referral sent by [name]'s PCP, Midge Minium, MD.   [name] was given information about Chronic Care Management services today including:  1. CCM service includes personalized support from designated clinical staff supervised by his physician, including individualized plan of care and coordination with other care providers 2. 24/7 contact phone numbers for assistance for urgent and routine care needs. 3. Service will only be billed when office clinical staff spend 20 minutes or more in a month to coordinate care. 4. Only one practitioner may furnish and bill the service in a calendar month. 5. The patient may stop CCM services at any time (effective at the end of the month) by phone call to the office staff.   Patient did not agree to services and wishes to consider information provided before deciding about enrollment in care management services.   Follow up plan:   Earney [place] Upstream Scheduler

## 2019-05-03 ENCOUNTER — Encounter: Payer: Self-pay | Admitting: Internal Medicine

## 2019-06-19 ENCOUNTER — Ambulatory Visit (AMBULATORY_SURGERY_CENTER): Payer: Self-pay | Admitting: *Deleted

## 2019-06-19 ENCOUNTER — Other Ambulatory Visit: Payer: Self-pay

## 2019-06-19 VITALS — Ht 70.0 in | Wt 235.0 lb

## 2019-06-19 DIAGNOSIS — Z1211 Encounter for screening for malignant neoplasm of colon: Secondary | ICD-10-CM

## 2019-06-19 MED ORDER — SUTAB 1479-225-188 MG PO TABS: 1.0000 | ORAL_TABLET | ORAL | 0 refills | Status: DC

## 2019-06-19 NOTE — Progress Notes (Signed)
Patient is here in-person for PV. Patient denies any allergies to eggs or soy. Patient denies any problems with anesthesia/sedation. Patient denies any oxygen use at home. Patient denies taking any diet/weight loss medications or blood thinners. Patient is not being treated for MRSA or C-diff. Patient is aware of our care-partner policy and [facility]SL-37 safety protocol. Completed covid vaccines 03/06/19 per pt. Prep Prescription coupon given to the patient.

## 2019-06-26 ENCOUNTER — Telehealth: Payer: Self-pay | Admitting: Internal Medicine

## 2019-07-03 ENCOUNTER — Ambulatory Visit (AMBULATORY_SURGERY_CENTER): Payer: Medicare Other | Admitting: Internal Medicine

## 2019-07-03 ENCOUNTER — Other Ambulatory Visit: Payer: Self-pay

## 2019-07-03 ENCOUNTER — Encounter: Payer: Self-pay | Admitting: Internal Medicine

## 2019-07-03 VITALS — BP 119/76 | HR 73 | Temp 97.1°F | Resp 23 | Ht 70.0 in | Wt 235.0 lb

## 2019-07-03 DIAGNOSIS — Z1211 Encounter for screening for malignant neoplasm of colon: Secondary | ICD-10-CM | POA: Diagnosis not present

## 2019-07-03 MED ORDER — SODIUM CHLORIDE 0.9 % IV SOLN: 500.0000 mL | INTRAVENOUS | Status: DC

## 2019-07-03 NOTE — Progress Notes (Signed)
PT taken to PACU. Monitors in place. VSS. Report given to RN. 

## 2019-07-03 NOTE — Op Note (Signed)
[facility] Patient Name: [name] Procedure Date: 07/03/2019 8:31 AM MRN: [id number] Endoscopist: Docia Chuck. [name] , MD Age: 74 Referring MD:  Date of Birth: 04/07/1945 Gender: Male Account #: [id number] Procedure:                Colonoscopy Indications:              Screening for colorectal malignant neoplasm. Prior                            exams 2001, 2010 Medicines:                Monitored Anesthesia Care Procedure:                Pre-Anesthesia Assessment:                           - Prior to the procedure, a History and Physical                            was performed, and patient medications and                            allergies were reviewed. The patient's tolerance of                            previous anesthesia was also reviewed. The risks                            and benefits of the procedure and the sedation                            options and risks were discussed with the patient.                            All questions were answered, and informed consent                            was obtained. Prior Anticoagulants: The patient has                            taken no previous anticoagulant or antiplatelet                            agents. ASA Grade Assessment: II - A patient with                            mild systemic disease. After reviewing the risks                            and benefits, the patient was deemed in                            satisfactory condition to undergo the procedure.  After obtaining informed consent, the colonoscope                            was passed under direct vision. Throughout the                            procedure, the patient's blood pressure, pulse, and                            oxygen saturations were monitored continuously. The                            Colonoscope was introduced through the anus and                            advanced to the the cecum, identified by                             appendiceal orifice and ileocecal valve. The                            ileocecal valve, appendiceal orifice, and rectum                            were photographed. The quality of the bowel                            preparation was good. The colonoscopy was performed                            without difficulty. The patient tolerated the                            procedure well. The bowel preparation used was                            SUPREP via split dose instruction. Scope In: 8:43:56 AM Scope Out: 8:56:30 AM Scope Withdrawal Time: 0 hours 9 minutes 47 seconds  Total Procedure Duration: 0 hours 12 minutes 34 seconds  Findings:                 Many large-mouthed diverticula were found in the                            entire colon.                           Internal hemorrhoids were found during                            retroflexion. The hemorrhoids were small.                           The exam was otherwise without abnormality on  direct and retroflexion views. Complications:            No immediate complications. Estimated blood loss:                            None. Estimated Blood Loss:     Estimated blood loss: none. Impression:               - Diverticulosis in the entire examined colon.                           - Internal hemorrhoids.                           - The examination was otherwise normal on direct                            and retroflexion views.                           - No specimens collected. Recommendation:           - Repeat colonoscopy is not recommended for                            screening purposes.                           - Patient has a contact number available for                            emergencies. The signs and symptoms of potential                            delayed complications were discussed with the                            patient. Return to normal activities tomorrow.                             Written discharge instructions were provided to the                            patient.                           - Resume previous diet.                           - Continue present medications. Docia Chuck. Spencer Pastor, MD 07/03/2019 9:03:11 AM This report has been signed electronically.

## 2019-07-03 NOTE — Progress Notes (Signed)
Vs CW I have reviewed the patient's medical history in detail and updated the computerized patient record.   

## 2019-07-03 NOTE — Patient Instructions (Signed)
Please read handouts provided. Continue present medications.   YOU HAD AN ENDOSCOPIC PROCEDURE TODAY AT THE [facility]:   Refer to the procedure report that was given to you for any specific questions about what was found during the examination.  If the procedure report does not answer your questions, please call your gastroenterologist to clarify.  If you requested that your care partner not be given the details of your procedure findings, then the procedure report has been included in a sealed envelope for you to review at your convenience later.  YOU SHOULD EXPECT: Some feelings of bloating in the abdomen. Passage of more gas than usual.  Walking can help get rid of the air that was put into your GI tract during the procedure and reduce the bloating. If you had a lower endoscopy (such as a colonoscopy or flexible sigmoidoscopy) you may notice spotting of blood in your stool or on the toilet paper. If you underwent a bowel prep for your procedure, you may not have a normal bowel movement for a few days.  Please Note:  You might notice some irritation and congestion in your nose or some drainage.  This is from the oxygen used during your procedure.  There is no need for concern and it should clear up in a day or so.  SYMPTOMS TO REPORT IMMEDIATELY:  Following lower endoscopy (colonoscopy or flexible sigmoidoscopy):  Excessive amounts of blood in the stool  Significant tenderness or worsening of abdominal pains  Swelling of the abdomen that is new, acute  Fever of 100F or higher   For urgent or emergent issues, a gastroenterologist can be reached at any hour by calling [phone]. Do not use MyChart messaging for urgent concerns.    DIET:  We do recommend a small meal at first, but then you may proceed to your regular diet.  Drink plenty of fluids but you should avoid alcoholic beverages for 24 hours.  ACTIVITY:  You should plan to take it easy for the rest of today and  you should NOT DRIVE or use heavy machinery until tomorrow (because of the sedation medicines used during the test).    FOLLOW UP: Our staff will call the number listed on your records 48-72 hours following your procedure to check on you and address any questions or concerns that you may have regarding the information given to you following your procedure. If we do not reach you, we will leave a message.  We will attempt to reach you two times.  During this call, we will ask if you have developed any symptoms of COVID 19. If you develop any symptoms (ie: fever, flu-like symptoms, shortness of breath, cough etc.) before then, please call [phone].  If you test positive for Covid 19 in the 2 weeks post procedure, please call and report this information to us.    If any biopsies were taken you will be contacted by phone or by letter within the next 1-3 weeks.  Please call us at [phone] if you have not heard about the biopsies in 3 weeks.    SIGNATURES/CONFIDENTIALITY: You and/or your care partner have signed paperwork which will be entered into your electronic medical record.  These signatures attest to the fact that that the information above on your After Visit Summary has been reviewed and is understood.  Full responsibility of the confidentiality of this discharge information lies with you and/or your care-partner.  

## 2019-07-04 ENCOUNTER — Other Ambulatory Visit: Payer: Self-pay | Admitting: Family Medicine

## 2019-07-05 ENCOUNTER — Telehealth: Payer: Self-pay

## 2019-07-05 NOTE — Telephone Encounter (Signed)
  Follow up Call-  Call back number 07/03/2019  Post procedure Call Back phone  # [phone]  Permission to leave phone message Yes  Some recent data might be hidden     Patient questions:  Do you have a fever, pain , or abdominal swelling? No. Pain Score  0 *  Have you tolerated food without any problems? Yes.    Have you been able to return to your normal activities? Yes.    Do you have any questions about your discharge instructions: Diet   No. Medications  No. Follow up visit  No.  Do you have questions or concerns about your Care? No.  Actions: * If pain score is 4 or above: No action needed, pain <4.  1. Have you developed a fever since your procedure? no  2.   Have you had an respiratory symptoms (SOB or cough) since your procedure? no  3.   Have you tested positive for COVID 19 since your procedure no  4.   Have you had any family members/close contacts diagnosed with the COVID 19 since your procedure?  no   If yes to any of these questions please route to Joylene John, RN and Erenest Rasher, RN

## 2019-07-05 NOTE — Telephone Encounter (Signed)
Attempted to reach patient for post-procedure f/u call. No answer. Left message that we will make another attempt to reach him later today and for him to please not hesitate to call us if he has any questions/concerns. °

## 2019-07-06 ENCOUNTER — Other Ambulatory Visit: Payer: Self-pay | Admitting: Family Medicine

## 2019-07-11 ENCOUNTER — Telehealth: Payer: Self-pay | Admitting: Family Medicine

## 2019-07-11 ENCOUNTER — Other Ambulatory Visit: Payer: Self-pay

## 2019-07-11 MED ORDER — ESOMEPRAZOLE MAGNESIUM 40 MG PO CPDR: 40.0000 mg | DELAYED_RELEASE_CAPSULE | Freq: Two times a day (BID) | ORAL | 1 refills | Status: DC

## 2019-07-11 MED ORDER — METFORMIN HCL 500 MG PO TABS: 500.0000 mg | ORAL_TABLET | Freq: Two times a day (BID) | ORAL | 0 refills | Status: DC

## 2019-07-11 MED ORDER — LOSARTAN POTASSIUM 100 MG PO TABS: 100.0000 mg | ORAL_TABLET | Freq: Every day | ORAL | 1 refills | Status: DC

## 2019-07-11 NOTE — Telephone Encounter (Signed)
Pharmacy information updated and rx for losartan and metformin sent to patient's updated pharmacy.

## 2019-07-11 NOTE — Telephone Encounter (Signed)
Left a vm message for the patient to call back to update his pharmacy preference.

## 2019-07-11 NOTE — Telephone Encounter (Signed)
Patient would like to change his pharmacy to [facility] on [address], [place]  From [facility] in [place], [place].  Please advise

## 2019-07-16 ENCOUNTER — Telehealth: Payer: Self-pay | Admitting: Family Medicine

## 2019-07-16 NOTE — Telephone Encounter (Signed)
Paperwork given to PCP for review.  

## 2019-07-16 NOTE — Telephone Encounter (Signed)
Form completed and placed in basket  

## 2019-07-16 NOTE — Telephone Encounter (Signed)
FYI

## 2019-07-16 NOTE — Telephone Encounter (Signed)
I have placed a disability placard in the bin upfront with a charge sheet.

## 2019-07-17 NOTE — Telephone Encounter (Signed)
Picked up from the back and made pt aware

## 2019-08-03 ENCOUNTER — Telehealth: Payer: Self-pay | Admitting: Family Medicine

## 2019-08-03 MED ORDER — SIMVASTATIN 40 MG PO TABS: ORAL_TABLET | ORAL | 1 refills | Status: DC

## 2019-08-03 NOTE — Telephone Encounter (Signed)
Patient needs simvastatin - 40 mg. - Please Send to [facility] on [address] - last visit: :04/11/2019 - Next Visit:  10/08/2019

## 2019-08-03 NOTE — Telephone Encounter (Signed)
Medication filled to pharmacy as requested.   

## 2019-08-16 DIAGNOSIS — H2513 Age-related nuclear cataract, bilateral: Secondary | ICD-10-CM | POA: Diagnosis not present

## 2019-08-16 DIAGNOSIS — H5212 Myopia, left eye: Secondary | ICD-10-CM | POA: Diagnosis not present

## 2019-08-16 LAB — HM DIABETES EYE EXAM

## 2019-08-24 ENCOUNTER — Telehealth: Payer: Self-pay | Admitting: Family Medicine

## 2019-08-24 ENCOUNTER — Other Ambulatory Visit: Payer: Self-pay

## 2019-08-24 MED ORDER — DICLOFENAC SODIUM 75 MG PO TBEC: 75.0000 mg | DELAYED_RELEASE_TABLET | Freq: Two times a day (BID) | ORAL | 1 refills | Status: DC

## 2019-08-24 MED ORDER — FAMOTIDINE 40 MG PO TABS: 40.0000 mg | ORAL_TABLET | Freq: Two times a day (BID) | ORAL | 1 refills | Status: DC

## 2019-08-24 NOTE — Telephone Encounter (Signed)
..  Medication Refills  Medication: famotidine, diclofenac  Pharmacy: [facility] in [address]   ** Let patient know to contact pharmacy at the end of the day to make sure medication is ready.**  ** Please notify patient to allow 48-72 hours to process.**  ** Encourage patient to contact the pharmacy for refills or they can request refills through [facility]**  Clinical Fills out below:   Last refill:  QTY:  Refill Date:    Other Comments: Is requesting [facility] rx to be removed and [facility] to be added for primary pharmacy.   Okay for refill?  Please advise.

## 2019-09-19 ENCOUNTER — Encounter: Payer: Self-pay | Admitting: General Practice

## 2019-09-24 ENCOUNTER — Other Ambulatory Visit: Payer: Self-pay

## 2019-09-24 ENCOUNTER — Ambulatory Visit (INDEPENDENT_AMBULATORY_CARE_PROVIDER_SITE_OTHER): Payer: Medicare Other

## 2019-09-24 DIAGNOSIS — Z23 Encounter for immunization: Secondary | ICD-10-CM

## 2019-10-07 ENCOUNTER — Other Ambulatory Visit: Payer: Self-pay | Admitting: Family Medicine

## 2019-10-08 ENCOUNTER — Other Ambulatory Visit: Payer: Self-pay

## 2019-10-08 ENCOUNTER — Ambulatory Visit (INDEPENDENT_AMBULATORY_CARE_PROVIDER_SITE_OTHER): Payer: Medicare Other | Admitting: Family Medicine

## 2019-10-08 ENCOUNTER — Encounter: Payer: Self-pay | Admitting: Family Medicine

## 2019-10-08 VITALS — BP 120/78 | HR 79 | Temp 97.6°F | Resp 17 | Ht 70.0 in | Wt 232.5 lb

## 2019-10-08 DIAGNOSIS — E119 Type 2 diabetes mellitus without complications: Secondary | ICD-10-CM

## 2019-10-08 DIAGNOSIS — I10 Essential (primary) hypertension: Secondary | ICD-10-CM | POA: Diagnosis not present

## 2019-10-08 DIAGNOSIS — E785 Hyperlipidemia, unspecified: Secondary | ICD-10-CM | POA: Diagnosis not present

## 2019-10-08 LAB — BASIC METABOLIC PANEL
BUN: 20 mg/dL (ref 6–23)
CO2: 26 mEq/L (ref 19–32)
Calcium: 9.3 mg/dL (ref 8.4–10.5)
Chloride: 106 mEq/L (ref 96–112)
Creatinine, Ser: 1.03 mg/dL (ref 0.40–1.50)
GFR: 70.44 mL/min (ref >60.00)
Glucose, Bld: 115 mg/dL — ABNORMAL HIGH (ref 70–99)
Potassium: 4.8 mEq/L (ref 3.5–5.1)
Sodium: 140 mEq/L (ref 135–145)

## 2019-10-08 LAB — TSH: TSH: 1.07 u[IU]/mL (ref 0.35–4.50)

## 2019-10-08 LAB — LIPID PANEL
Cholesterol: 107 mg/dL (ref 0–200)
HDL: 38.4 mg/dL — ABNORMAL LOW (ref >39.00)
LDL Cholesterol: 51 mg/dL (ref 0–99)
NonHDL: 68.62
Total CHOL/HDL Ratio: 3
Triglycerides: 89 mg/dL (ref 0.0–149.0)
VLDL: 17.8 mg/dL (ref 0.0–40.0)

## 2019-10-08 LAB — CBC WITH DIFFERENTIAL/PLATELET
Basophils Absolute: 0 10*3/uL (ref 0.0–0.1)
Basophils Relative: 0.2 % (ref 0.0–3.0)
Eosinophils Absolute: 0.2 10*3/uL (ref 0.0–0.7)
Eosinophils Relative: 3 % (ref 0.0–5.0)
HCT: 42.9 % (ref 39.0–52.0)
Hemoglobin: 14.3 g/dL (ref 13.0–17.0)
Lymphocytes Relative: 25.1 % (ref 12.0–46.0)
Lymphs Abs: 1.9 10*3/uL (ref 0.7–4.0)
MCHC: 33.3 g/dL (ref 30.0–36.0)
MCV: 91.7 fl (ref 78.0–100.0)
Monocytes Absolute: 0.8 10*3/uL (ref 0.1–1.0)
Monocytes Relative: 10.9 % (ref 3.0–12.0)
Neutro Abs: 4.7 10*3/uL (ref 1.4–7.7)
Neutrophils Relative %: 60.8 % (ref 43.0–77.0)
Platelets: 264 10*3/uL (ref 150.0–400.0)
RBC: 4.68 Mil/uL (ref 4.22–5.81)
RDW: 14.2 % (ref 11.5–15.5)
WBC: 7.7 10*3/uL (ref 4.0–10.5)

## 2019-10-08 LAB — HEPATIC FUNCTION PANEL
ALT: 24 U/L (ref 0–53)
AST: 17 U/L (ref 0–37)
Albumin: 4.1 g/dL (ref 3.5–5.2)
Alkaline Phosphatase: 39 U/L (ref 39–117)
Bilirubin, Direct: 0.1 mg/dL (ref 0.0–0.3)
Total Bilirubin: 0.5 mg/dL (ref 0.2–1.2)
Total Protein: 6.3 g/dL (ref 6.0–8.3)

## 2019-10-08 LAB — HEMOGLOBIN A1C: Hgb A1c MFr Bld: 6 % (ref 4.6–6.5)

## 2019-10-08 MED ORDER — GLIMEPIRIDE 2 MG PO TABS: ORAL_TABLET | ORAL | 1 refills | Status: DC

## 2019-10-08 NOTE — Assessment & Plan Note (Signed)
Chronic problem.  Well controlled today on Losartan 100mg  daily.  Currently asymptomatic.  Check labs.  No anticipated med changes.  Will follow.

## 2019-10-08 NOTE — Assessment & Plan Note (Signed)
Chronic problem.  Hx of excellent control on Metformin 500mg  BID.  Last A1C 5.7  UTD on foot exam, eye exam, on ARB for renal protection.  Check labs.  Adjust meds prn

## 2019-10-08 NOTE — Assessment & Plan Note (Signed)
Chronic problem.  Tolerating Simvastatin 40mg  w/o difficulty.  Check labs.  Adjust meds prn

## 2019-10-08 NOTE — Progress Notes (Signed)
   Subjective:    Patient ID: [name], male    DOB: May 26, 1945, 74 y.o.   MRN: [id number]  HPI DM- chronic problem, on Glimepiride 2mg  daily, Metformin 500mg  BID.  UTD on eye exam, foot exam.  On ARB for renal protection.  Last A1C 5.7.  Denies symptomatic lows.  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  No sores/blisters/wounds on feet.  HTN- chronic problem, on Losartan 100mg  daily w/ good control.  BP 120/78.  No CP, SOB, HAs, visual changes, edema.  Hyperlipidemia- chronic problem, on Simvastatin 40mg .  Pt is walking 2-3x/week for at least 30 minutes.  No abd pain, N/V   Review of Systems For ROS see HPI   This visit occurred during the SARS-CoV-2 public health emergency.  Safety protocols were in place, including screening questions prior to the visit, additional usage of staff PPE, and extensive cleaning of exam room while observing appropriate contact time as indicated for disinfecting solutions.       Objective:   Physical Exam Vitals reviewed.  Constitutional:      General: He is not in acute distress.    Appearance: He is well-developed. He is obese.  HENT:     Head: Normocephalic and atraumatic.  Eyes:     Conjunctiva/sclera: Conjunctivae normal.     Pupils: Pupils are equal, round, and reactive to light.  Neck:     Thyroid: No thyromegaly.  Cardiovascular:     Rate and Rhythm: Normal rate and regular rhythm.     Heart sounds: Normal heart sounds. No murmur heard.   Pulmonary:     Effort: Pulmonary effort is normal. No respiratory distress.     Breath sounds: Normal breath sounds.  Abdominal:     General: Bowel sounds are normal. There is no distension.     Palpations: Abdomen is soft.  Musculoskeletal:     Cervical back: Normal range of motion and neck supple.  Lymphadenopathy:     Cervical: No cervical adenopathy.  Skin:    General: Skin is warm and dry.  Neurological:     Mental Status: He is alert and oriented to  person, place, and time.     Cranial Nerves: No cranial nerve deficit.  Psychiatric:        Behavior: Behavior normal.           Assessment & Plan:

## 2019-10-08 NOTE — Patient Instructions (Signed)
Follow up in 6 months to recheck DM, HTN, Hyperlipidemia We'll notify you of your lab results and make any changes if needed Keep up the good work on healthy diet and regular exercise- you're down 3 lbs! Schedule your COVID booster at your convenience  Call with any questions or concerns Stay Safe!  Stay Healthy!

## 2019-10-09 ENCOUNTER — Encounter: Payer: Self-pay | Admitting: General Practice

## 2019-10-16 ENCOUNTER — Ambulatory Visit: Payer: Medicare Other | Attending: Internal Medicine

## 2019-10-16 DIAGNOSIS — Z23 Encounter for immunization: Secondary | ICD-10-CM

## 2019-10-16 NOTE — Progress Notes (Signed)
   Covid-19 Vaccination Clinic  Name:  ALEXANDAR WEISENBERGER    MRN: [id number] DOB: January 16, 1945  10/16/2019  Mr. Underwood was observed post Covid-19 immunization for 15 minutes without incident. He was provided with Vaccine Information Sheet and instruction to access the V-Safe system.   Mr. Fosco was instructed to call 911 with any severe reactions post vaccine: [name] Difficulty breathing  . Swelling of face and throat  . A fast heartbeat  . A bad rash all over body  . Dizziness and weakness

## 2019-10-24 ENCOUNTER — Other Ambulatory Visit: Payer: Self-pay | Admitting: Family Medicine

## 2019-10-29 DIAGNOSIS — H2513 Age-related nuclear cataract, bilateral: Secondary | ICD-10-CM | POA: Diagnosis not present

## 2019-12-11 DIAGNOSIS — H2511 Age-related nuclear cataract, right eye: Secondary | ICD-10-CM | POA: Diagnosis not present

## 2019-12-11 DIAGNOSIS — H25811 Combined forms of age-related cataract, right eye: Secondary | ICD-10-CM | POA: Diagnosis not present

## 2019-12-24 DIAGNOSIS — L821 Other seborrheic keratosis: Secondary | ICD-10-CM | POA: Diagnosis not present

## 2019-12-24 DIAGNOSIS — L738 Other specified follicular disorders: Secondary | ICD-10-CM | POA: Diagnosis not present

## 2019-12-24 DIAGNOSIS — D2372 Other benign neoplasm of skin of left lower limb, including hip: Secondary | ICD-10-CM | POA: Diagnosis not present

## 2019-12-24 DIAGNOSIS — D1801 Hemangioma of skin and subcutaneous tissue: Secondary | ICD-10-CM | POA: Diagnosis not present

## 2019-12-24 DIAGNOSIS — D2371 Other benign neoplasm of skin of right lower limb, including hip: Secondary | ICD-10-CM | POA: Diagnosis not present

## 2020-01-06 ENCOUNTER — Other Ambulatory Visit: Payer: Self-pay | Admitting: Family Medicine

## 2020-01-08 DIAGNOSIS — H2512 Age-related nuclear cataract, left eye: Secondary | ICD-10-CM | POA: Diagnosis not present

## 2020-01-08 DIAGNOSIS — H25812 Combined forms of age-related cataract, left eye: Secondary | ICD-10-CM | POA: Diagnosis not present

## 2020-01-22 ENCOUNTER — Other Ambulatory Visit: Payer: Self-pay | Admitting: Family Medicine

## 2020-01-23 ENCOUNTER — Other Ambulatory Visit: Payer: Self-pay | Admitting: Emergency Medicine

## 2020-01-23 DIAGNOSIS — E785 Hyperlipidemia, unspecified: Secondary | ICD-10-CM

## 2020-01-23 MED ORDER — SIMVASTATIN 40 MG PO TABS: ORAL_TABLET | ORAL | 2 refills | Status: DC

## 2020-02-04 DIAGNOSIS — R972 Elevated prostate specific antigen [PSA]: Secondary | ICD-10-CM | POA: Diagnosis not present

## 2020-02-04 DIAGNOSIS — N5201 Erectile dysfunction due to arterial insufficiency: Secondary | ICD-10-CM | POA: Diagnosis not present

## 2020-02-04 DIAGNOSIS — N401 Enlarged prostate with lower urinary tract symptoms: Secondary | ICD-10-CM | POA: Diagnosis not present

## 2020-02-27 ENCOUNTER — Telehealth: Payer: Self-pay | Admitting: Family Medicine

## 2020-02-27 DIAGNOSIS — K219 Gastro-esophageal reflux disease without esophagitis: Secondary | ICD-10-CM

## 2020-02-27 MED ORDER — FAMOTIDINE 40 MG PO TABS: 40.0000 mg | ORAL_TABLET | Freq: Two times a day (BID) | ORAL | 1 refills | Status: DC

## 2020-02-27 NOTE — Telephone Encounter (Signed)
Rx sent to the preferred patient pharmacy  

## 2020-02-27 NOTE — Telephone Encounter (Signed)
Patient name; [name]  Patient phone: [phone]  Provider: Dr Birdie Riddle  Reason: Patient stated the the pharmacy has been trying to reach Dr Birdie Riddle for a Rx refill of Famotidine 40mg . Please call pharmacy.  Pharmacy:  [facility]

## 2020-03-01 ENCOUNTER — Other Ambulatory Visit: Payer: Self-pay | Admitting: Family Medicine

## 2020-04-08 ENCOUNTER — Encounter: Payer: Self-pay | Admitting: Family Medicine

## 2020-04-08 ENCOUNTER — Other Ambulatory Visit: Payer: Self-pay

## 2020-04-08 ENCOUNTER — Ambulatory Visit (INDEPENDENT_AMBULATORY_CARE_PROVIDER_SITE_OTHER): Payer: Medicare Other | Admitting: Family Medicine

## 2020-04-08 VITALS — BP 128/80 | HR 89 | Temp 97.8°F | Resp 17 | Ht 70.0 in | Wt 232.2 lb

## 2020-04-08 DIAGNOSIS — E119 Type 2 diabetes mellitus without complications: Secondary | ICD-10-CM | POA: Diagnosis not present

## 2020-04-08 DIAGNOSIS — K219 Gastro-esophageal reflux disease without esophagitis: Secondary | ICD-10-CM

## 2020-04-08 DIAGNOSIS — I10 Essential (primary) hypertension: Secondary | ICD-10-CM | POA: Diagnosis not present

## 2020-04-08 DIAGNOSIS — E785 Hyperlipidemia, unspecified: Secondary | ICD-10-CM | POA: Diagnosis not present

## 2020-04-08 LAB — CBC WITH DIFFERENTIAL/PLATELET
Basophils Absolute: 0 10*3/uL (ref 0.0–0.1)
Basophils Relative: 0.2 % (ref 0.0–3.0)
Eosinophils Absolute: 0.3 10*3/uL (ref 0.0–0.7)
Eosinophils Relative: 3.5 % (ref 0.0–5.0)
HCT: 43.4 % (ref 39.0–52.0)
Hemoglobin: 14.7 g/dL (ref 13.0–17.0)
Lymphocytes Relative: 25.4 % (ref 12.0–46.0)
Lymphs Abs: 1.9 10*3/uL (ref 0.7–4.0)
MCHC: 33.9 g/dL (ref 30.0–36.0)
MCV: 92.3 fl (ref 78.0–100.0)
Monocytes Absolute: 0.8 10*3/uL (ref 0.1–1.0)
Monocytes Relative: 10.7 % (ref 3.0–12.0)
Neutro Abs: 4.5 10*3/uL (ref 1.4–7.7)
Neutrophils Relative %: 60.2 % (ref 43.0–77.0)
Platelets: 236 10*3/uL (ref 150.0–400.0)
RBC: 4.7 Mil/uL (ref 4.22–5.81)
RDW: 13.7 % (ref 11.5–15.5)
WBC: 7.5 10*3/uL (ref 4.0–10.5)

## 2020-04-08 LAB — LIPID PANEL
Cholesterol: 101 mg/dL (ref 0–200)
HDL: 36.2 mg/dL — ABNORMAL LOW (ref >39.00)
LDL Cholesterol: 39 mg/dL (ref 0–99)
NonHDL: 64.51
Total CHOL/HDL Ratio: 3
Triglycerides: 130 mg/dL (ref 0.0–149.0)
VLDL: 26 mg/dL (ref 0.0–40.0)

## 2020-04-08 LAB — HEPATIC FUNCTION PANEL
ALT: 25 U/L (ref 0–53)
AST: 21 U/L (ref 0–37)
Albumin: 4.1 g/dL (ref 3.5–5.2)
Alkaline Phosphatase: 38 U/L — ABNORMAL LOW (ref 39–117)
Bilirubin, Direct: 0.1 mg/dL (ref 0.0–0.3)
Total Bilirubin: 0.5 mg/dL (ref 0.2–1.2)
Total Protein: 6.4 g/dL (ref 6.0–8.3)

## 2020-04-08 LAB — TSH: TSH: 1.23 u[IU]/mL (ref 0.35–4.50)

## 2020-04-08 LAB — BASIC METABOLIC PANEL
BUN: 17 mg/dL (ref 6–23)
CO2: 25 mEq/L (ref 19–32)
Calcium: 9.2 mg/dL (ref 8.4–10.5)
Chloride: 106 mEq/L (ref 96–112)
Creatinine, Ser: 0.98 mg/dL (ref 0.40–1.50)
GFR: 75.54 mL/min (ref >60.00)
Glucose, Bld: 101 mg/dL — ABNORMAL HIGH (ref 70–99)
Potassium: 4.1 mEq/L (ref 3.5–5.1)
Sodium: 139 mEq/L (ref 135–145)

## 2020-04-08 LAB — HEMOGLOBIN A1C: Hgb A1c MFr Bld: 5.9 % (ref 4.6–6.5)

## 2020-04-08 MED ORDER — FLUOCINOLONE ACETONIDE 0.01 % OT OIL: 5.0000 [drp] | TOPICAL_OIL | Freq: Two times a day (BID) | OTIC | 0 refills | Status: DC

## 2020-04-08 MED ORDER — ESOMEPRAZOLE MAGNESIUM 40 MG PO CPDR: 40.0000 mg | DELAYED_RELEASE_CAPSULE | Freq: Two times a day (BID) | ORAL | 1 refills | Status: DC

## 2020-04-08 MED ORDER — METFORMIN HCL 500 MG PO TABS: ORAL_TABLET | ORAL | 0 refills | Status: DC

## 2020-04-08 NOTE — Assessment & Plan Note (Signed)
Chronic problem.  Pt has hx of good control on Glimepiride and Metformin.  UTD on eye exam.  Foot exam done today.  On ARB for renal protection.  Check labs.  Adjust meds prn

## 2020-04-08 NOTE — Assessment & Plan Note (Signed)
Chronic problem.  Tolerating Simvastatin w/o difficulty.  Check labs.  Adjust meds prn  

## 2020-04-08 NOTE — Assessment & Plan Note (Signed)
Chronic problem.  Reports current regimen of Nexium and Pepcid is working for him but the Nexium is $150 for a 3 month supply.  After checking GoodRx it is much cheaper at [facility].  Prescription sent to new pharmacy.

## 2020-04-08 NOTE — Patient Instructions (Signed)
Follow up in 6 months to recheck diabetes, BP, and cholesterol We'll notify you of your lab results and make any changes if needed Continue to work on healthy diet and regular exercise- you can do it! Get the Nexium at [facility] through [facility] Call with any questions or concerns [place]!!!

## 2020-04-08 NOTE — Progress Notes (Signed)
   Subjective:    Patient ID: [name], male    DOB: Sep 24, 1945, 75 y.o.   MRN: [id number]  HPI DM- chronic problem, on Glimepiride 2mg  daily and Metformin 500mg  BID.  UTD on eye exam, due for foot exam, on ARB for renal protection.  Last A1C 6%  Denies symptomatic lows.  No neuropathy.  No blisters/sores/wounds  HTN- chronic problem, on Losartan 100mg  w/ good control.  No CP, SOB, HAs, visual changes, edema.  Hyperlipidemia- chronic problem, on Simvastatin 40mg  daily.  No abd pain, N/V.  GERD- pt reports Nexium is $150 for a 3 month supply.   Review of Systems For ROS see HPI   This visit occurred during the SARS-CoV-2 public health emergency.  Safety protocols were in place, including screening questions prior to the visit, additional usage of staff PPE, and extensive cleaning of exam room while observing appropriate contact time as indicated for disinfecting solutions.       Objective:   Physical Exam Constitutional:      General: He is not in acute distress.    Appearance: Normal appearance. He is well-developed. He is obese.  HENT:     Head: Normocephalic and atraumatic.  Eyes:     Conjunctiva/sclera: Conjunctivae normal.     Pupils: Pupils are equal, round, and reactive to light.  Neck:     Thyroid: No thyromegaly.  Cardiovascular:     Rate and Rhythm: Normal rate and regular rhythm.     Pulses: Normal pulses.     Heart sounds: Normal heart sounds. No murmur heard.   Pulmonary:     Effort: Pulmonary effort is normal. No respiratory distress.     Breath sounds: Normal breath sounds.  Abdominal:     General: Bowel sounds are normal. There is no distension.     Palpations: Abdomen is soft.  Musculoskeletal:     Cervical back: Normal range of motion and neck supple.     Right lower leg: No edema.     Left lower leg: No edema.  Lymphadenopathy:     Cervical: No cervical adenopathy.  Skin:    General: Skin is warm and dry.  Neurological:     Mental Status: He  is alert and oriented to person, place, and time.     Cranial Nerves: No cranial nerve deficit.  Psychiatric:        Behavior: Behavior normal.           Assessment & Plan:

## 2020-04-08 NOTE — Assessment & Plan Note (Signed)
Chronic problem, well controlled on Losartan.  Currently asymptomatic.  Check labs.  No anticipated med changes.

## 2020-04-11 NOTE — Progress Notes (Signed)
Subjective:   [name] is a 75 y.o. male who presents for Medicare Annual/Subsequent preventive examination.  I connected with Brevin today by telephone and verified that I am speaking with the correct person using two identifiers. Location patient: home Location provider: work Persons participating in the virtual visit: patient, Marine scientist.    I discussed the limitations, risks, security and privacy concerns of performing an evaluation and management service by telephone and the availability of in person appointments. I also discussed with the patient that there may be a patient responsible charge related to this service. The patient expressed understanding and verbally consented to this telephonic visit.    Interactive audio and video telecommunications were attempted between this provider and patient, however failed, due to patient having technical difficulties OR patient did not have access to video capability.  We continued and completed visit with audio only.  Some vital signs may be absent or patient reported.   Time Spent with patient on telephone encounter: 20 minutes   Review of Systems     Cardiac Risk Factors include: advanced age (>28mn, >>29women);diabetes mellitus;dyslipidemia;hypertension;obesity (BMI >30kg/m2)     Objective:    Today's Vitals   04/14/20 0945  Weight: 232 lb (105.2 kg)  Height: _0  (1.778 m)   Body mass index is 33.29 kg/m.  Advanced Directives 04/14/2020 04/11/2019 01/26/2018 05/13/2017 01/25/2017 05/19/2016 05/12/2016  Does Patient Have a Medical Advance Directive? _1  Yes Yes  Type of [place] Living will;Healthcare Power of Attorney Living will;Healthcare Power of Attorney Living will [place]  Does patient want to make changes to medical advance directive? - No -  Patient declined - No - Patient declined - No - Patient declined No - Patient declined  Copy of [place]? No - copy requested No - copy requested No - copy requested - No - copy requested No - copy requested -  Pre-existing out of facility DNR order (yellow form or pink MOST form) - - - - - - -    Current Medications (verified) Outpatient Encounter Medications as of 04/14/2020  Medication Sig  . alfuzosin (UROXATRAL) 10 MG 24 hr tablet Take 10 mg by mouth daily with breakfast.  . aspirin EC 81 MG tablet Take 1 tablet (81 mg total) by mouth daily. Resume 4 days post-op  . cetirizine (ZYRTEC) 10 MG tablet Take 10 mg by mouth daily.  . diclofenac (VOLTAREN) 75 MG EC tablet TAKE 1 TABLET(75 MG) BY MOUTH TWICE DAILY WITH A MEAL  . docusate sodium (COLACE) 100 MG capsule Take 1 capsule (100 mg total) by mouth 2 (two) times daily as needed for mild constipation.  .Marland Kitchenesomeprazole (NEXIUM) 40 MG capsule Take 1 capsule (40 mg total) by mouth 2 (two) times daily before a meal.  . famotidine (PEPCID) 40 MG tablet Take 1 tablet (40 mg total) by mouth 2 (two) times daily.  . Fluocinolone Acetonide 0.01 % OIL Place 5 drops in ear(s) 2 (two) times daily.  .Marland Kitchenglimepiride (AMARYL) 2 MG tablet TAKE 1 TABLET BY MOUTH EVERY DAY BEFORE BREAKFAST  . glucose blood (ONETOUCH ULTRA) test strip Use as instructed to test sugars 1-2 times daily. Dx. E11.9  . losartan (COZAAR) 100 MG tablet TAKE 1 TABLET(100 MG) BY MOUTH DAILY  . metFORMIN (GLUCOPHAGE) 500 MG tablet TAKE 1 TABLET(500 MG) BY MOUTH  TWICE DAILY WITH A MEAL  . multivitamin (THERAGRAN) per tablet Take 1 tablet by mouth daily.  . Probiotic Product (DIGESTIVE ADVANTAGE GUMMIES PO)   . simvastatin (ZOCOR) 40 MG tablet Take 1 tablet in the evening daily  . Sodium Sulfate-Mag Sulfate-KCl (SUTAB) 3120252970 MG TABS Take 1 kit by mouth as directed.  . Blood Glucose Monitoring Suppl (ONE TOUCH ULTRA 2) w/Device KIT Use as directed to test  sugars 1-2 times daily. Dx E11.9 (Patient not taking: No sig reported)   No facility-administered encounter medications on file as of 04/14/2020.    Allergies (verified) Ciprofloxacin and Niacin   History: Past Medical History:  Diagnosis Date  . Anxiety   . BPH (benign prostatic hypertrophy) with urinary obstruction   . Cholelithiasis   . Clotting disorder ([place])   . Complication of anesthesia    urinary retention ; "bladder didnt wake up"; says last gallbladder surgery he shouldve been cathed with his med hx  . Diabetes mellitus    Type II  . Diverticulosis of colon   . DJD (degenerative joint disease)   . GERD (gastroesophageal reflux disease)   . Hemorrhoids   . History of colonic polyps   . History of DVT (deep vein thrombosis)   . History of kidney stones   . Hyperlipidemia   . Hypertension, benign   . Low back pain syndrome   . Nephrolithiasis   . pulmonary nodule, left lower lobe   . Spinal stenosis   . Varicose veins of lower extremities   . Venous insufficiency    Past Surgical History:  Procedure Laterality Date  . APPENDECTOMY    . bilateral inguinal hernia repairs    . CERVICAL DISC ARTHROPLASTY    . CHOLECYSTECTOMY  03/07/2012  . CHOLECYSTECTOMY N/A 03/07/2012   Procedure: LAPAROSCOPIC CHOLECYSTECTOMY;  Surgeon: Rolm Bookbinder, MD;  Location: [facility];  Service: General;  Laterality: N/A;  . COLONOSCOPY  07/2008  . HERNIA REPAIR     ventral hernia with mesh 1990s  . JOINT REPLACEMENT    . left ankle surgery with plate and screws  09/8117   by Dr. Sharol Given  . LITHOTRIPSY    . LUMBAR LAMINECTOMY/DECOMPRESSION MICRODISCECTOMY N/A 05/19/2016   Procedure: Microlumbar decompression L3-4, L4-5;  Surgeon: Susa Day, MD;  Location: [facility];  Service: Orthopedics;  Laterality: N/A;  . LUMBAR LAMINECTOMY/DECOMPRESSION MICRODISCECTOMY N/A 05/19/2017   Procedure: Microlumbar decompression Lumbar five-Sacral one;  Surgeon: Susa Day, MD;  Location: [facility];  Service:  Orthopedics;  Laterality: N/A;  . sebaceous cyst surgery    . t1-t2 disc surgery  2007   by Dr. Louanne Skye  . TONSILLECTOMY    . TOTAL KNEE ARTHROPLASTY     right and left   Family History  Problem Relation Age of Onset  . Gout Father   . Coronary artery disease Father 24       died of massive MI age 43  . Heart attack Father   . Diabetes Mother   . CAD Brother 43  . Colon cancer Neg Hx   . Colon polyps Neg Hx   . Esophageal cancer Neg Hx   . Rectal cancer Neg Hx   . Stomach cancer Neg Hx    Social History   Socioeconomic History  . Marital status: Married    Spouse name: Not on file  . Number of children: 2  . Years of education: Not on file  . Highest education level: Not on file  Occupational History  .  Occupation: retired  Tobacco Use  . Smoking status: Never Smoker  . Smokeless tobacco: Never Used  Vaping Use  . Vaping Use: Never used  Substance and Sexual Activity  . Alcohol use: Not Currently    Comment: social  use  . Drug use: No  . Sexual activity: Not on file  Other Topics Concern  . Not on file  Social History Narrative   Retired.   Social Determinants of Health   Financial Resource Strain: Low Risk   . Difficulty of Paying Living Expenses: Not hard at all  Food Insecurity: No Food Insecurity  . Worried About Charity fundraiser in the Last Year: Never true  . Ran Out of Food in the Last Year: Never true  Transportation Needs: No Transportation Needs  . Lack of Transportation (Medical): No  . Lack of Transportation (Non-Medical): No  Physical Activity: Insufficiently Active  . Days of Exercise per Week: 3 days  . Minutes of Exercise per Session: 30 min  Stress: No Stress Concern Present  . Feeling of Stress : Not at all  Social Connections: Moderately Integrated  . Frequency of Communication with Friends and Family: More than three times a week  . Frequency of Social Gatherings with Friends and Family: More than three times a week  . Attends  Religious Services: Never  . Active Member of Clubs or Organizations: Yes  . Attends Archivist Meetings: 1 to 4 times per year  . Marital Status: Married    Tobacco Counseling Counseling given: Not Answered   Clinical Intake:  Pre-visit preparation completed: Yes  Pain : No/denies pain     Nutritional Status: BMI > 30  Obese Nutritional Risks: None Diabetes: Yes CBG done?: No Did pt. bring in CBG monitor from home?: No (phone visit)  How often do you need to have someone help you when you read instructions, pamphlets, or other written materials from your doctor or pharmacy?: 1 - Never  Diabetes:  Is the patient diabetic?  Yes  If diabetic, was a CBG obtained today?  No  Did the patient bring in their glucometer from home?  No phone visit How often do you monitor your CBG's? occasionally.   Financial Strains and Diabetes Management:  Are you having any financial strains with the device, your supplies or your medication? No .  Does the patient want to be seen by Chronic Care Management for management of their diabetes?  No  Would the patient like to be referred to a Nutritionist or for Diabetic Management?  No   Diabetic Exams:  Diabetic Eye Exam: Completed 08/16/2019.   Diabetic Foot Exam: Completed 04/08/2020.     Interpreter Needed?: No  Information entered by :: Caroleen Hamman LPN   Activities of Daily Living In your present state of health, do you have any difficulty performing the following activities: 04/14/2020 04/08/2020  Hearing? N N  Vision? N N  Comment - -  Difficulty concentrating or making decisions? N N  Walking or climbing stairs? N N  Dressing or bathing? N N  Doing errands, shopping? N N  Preparing Food and eating ? N -  Using the Toilet? N -  In the past six months, have you accidently leaked urine? N -  Do you have problems with loss of bowel control? N -  Managing your Medications? N -  Managing your Finances? N -   Housekeeping or managing your Housekeeping? N -  Some recent data might be hidden  Patient Care Team: Midge Minium, MD as PCP - General (Family Medicine) Paralee Cancel, MD as Consulting Physician (Orthopedic Surgery) Irene Shipper, MD as Consulting Physician (Gastroenterology) Sharyne Peach, MD as Consulting Physician (Ophthalmology) [facility], Michae Kava, MD as Attending Physician Susa Day, MD as Consulting Physician (Orthopedic Surgery) Rolm Bookbinder, MD as Consulting Physician (Dermatology) [place] (Dentistry)  Indicate any recent Medical Services you may have received from other than [facility] providers in the past year (date may be approximate).     Assessment:   This is a routine wellness examination for Jaran.  Hearing/Vision screen  Hearing Screening   _0  _1  _2  _3  _4  _5  _6  _7  _8   Right ear:           Left ear:           Comments: No issues  Vision Screening Comments: Wears glasses Last eye exam-08/2019-Dr. Delman Cheadle  Dietary issues and exercise activities discussed: Current Exercise Habits: Home exercise routine, Type of exercise: walking, Time (Minutes): 30, Frequency (Times/Week): 3, Weekly Exercise (Minutes/Week): 90, Intensity: Mild, Exercise limited by: None identified  Goals    . Patient Stated     Increase activity by walking more      Depression Screen PHQ 2/9 Scores 04/14/2020 04/08/2020 10/08/2019 04/11/2019 12/18/2018 12/08/2018 01/26/2018  PHQ - 2 Score 0 0 0 0 0 0 0  PHQ- 9 Score - 0 0 - - 0 -    Fall Risk Fall Risk  04/14/2020 04/08/2020 10/08/2019 04/11/2019 12/18/2018  Falls in the past year? 0 0 0 0 1  Number falls in past yr: 0 0 0 0 0  Injury with Fall? 0 0 0 0 0  Risk for fall due to : - No Fall Risks - - -  Follow up Falls prevention discussed - Falls evaluation completed Falls evaluation completed;Education provided;Falls prevention discussed Falls evaluation completed    FALL RISK PREVENTION  PERTAINING TO THE HOME:  Any stairs in or around the home? Yes  If so, are there any without handrails? No  Home free of loose throw rugs in walkways, pet beds, electrical cords, etc? Yes  Adequate lighting in your home to reduce risk of falls? Yes   ASSISTIVE DEVICES UTILIZED TO PREVENT FALLS:  Life alert? No  Use of a cane, walker or w/c? No  Grab bars in the bathroom? Yes  Shower chair or bench in shower? No  Elevated toilet seat or a handicapped toilet? No   TIMED UP AND GO:  Was the test performed? No .phone visit    Cognitive Function:Normal cognitive status assessed by  this Nurse Health Advisor. No abnormalities found.   MMSE - Mini Mental State Exam 01/26/2018 01/25/2017  Orientation to time 5 5  Orientation to Place 5 5  Registration 3 3  Attention/ Calculation 3 3  Recall 3 2  Language- name 2 objects 2 2  Language- repeat 1 1  Language- follow 3 step command 3 3  Language- read & follow direction 1 1  Write a sentence 1 1  Copy design 0 1  Total score 27 27     6CIT Screen 04/11/2019  What Year? 0 points  What month? 0 points  What time? 0 points  Count back from 20 0 points  Months in reverse 0 points  Repeat phrase 0 points  Total Score 0    Immunizations Immunization History  Administered Date(s) Administered  . Fluad Quad(high Dose 65+) 09/20/2018, 09/24/2019  . H1N1 01/03/2008  .  Influenza Split 09/21/2010, 09/14/2011  . Influenza Whole 11/05/2008, 09/15/2009  . Influenza,inj,Quad PF,6+ Mos 10/05/2012, 10/26/2013, 09/27/2014, 09/23/2015, 09/29/2016, 08/30/2017  . Influenza,inj,quad, With Preservative 10/04/2016  . PFIZER(Purple Top)SARS-COV-2 Vaccination 02/09/2019, 03/06/2019, 10/16/2019  . Pneumococcal Conjugate-13 12/18/2013  . Pneumococcal Polysaccharide-23 12/26/2012  . Pneumococcal-Unspecified 01/05/2015  . Tdap 11/22/2008    TDAP status: Due, Education has been provided regarding the importance of this vaccine. Advised may receive  this vaccine at local pharmacy or Health Dept. Aware to provide a copy of the vaccination record if obtained from local pharmacy or Health Dept. Verbalized acceptance and understanding.  Flu Vaccine status: Up to date  Pneumococcal vaccine status: Up to date  Covid-19 vaccine status: Completed vaccines  Qualifies for Shingles Vaccine? Yes   Zostavax completed No   Shingrix Completed?: No.    Education has been provided regarding the importance of this vaccine. Patient has been advised to call insurance company to determine out of pocket expense if they have not yet received this vaccine. Advised may also receive vaccine at local pharmacy or Health Dept. Verbalized acceptance and understanding.  Screening Tests Health Maintenance  Topic Date Due  . TETANUS/TDAP  11/23/2018  . Hepatitis C Screening  04/08/2021 (Originally 07-15-1945)  . INFLUENZA VACCINE  08/04/2020  . OPHTHALMOLOGY EXAM  08/15/2020  . HEMOGLOBIN A1C  10/08/2020  . FOOT EXAM  04/08/2021  . COLONOSCOPY (Pts 45-63yr Insurance coverage will need to be confirmed)  07/02/2029  . COVID-19 Vaccine  Completed  . PNA vac Low Risk Adult  Completed  . HPV VACCINES  Aged Out    Health Maintenance  Health Maintenance Due  Topic Date Due  . TETANUS/TDAP  11/23/2018    Colorectal cancer screening: No longer required.   Lung Cancer Screening: (Low Dose CT Chest recommended if Age 76-80years, 30 pack-year currently smoking OR have quit w/in 15years.) does not qualify.    Additional Screening:  Hepatitis C Screening: does qualify;declined  Vision Screening: Recommended annual ophthalmology exams for early detection of glaucoma and other disorders of the eye. Is the patient up to date with their annual eye exam?  Yes  Who is the provider or what is the name of the office in which the patient attends annual eye exams? Dr. GDelman Cheadle  Dental Screening: Recommended annual dental exams for proper oral hygiene  Community  Resource Referral / Chronic Care Management: CRR required this visit?  No   CCM required this visit?  No      Plan:     I have personally reviewed and noted the following in the patient's chart:   . Medical and social history . Use of alcohol, tobacco or illicit drugs  . Current medications and supplements . Functional ability and status . Nutritional status . Physical activity . Advanced directives . List of other physicians . Hospitalizations, surgeries, and ER visits in previous 12 months . Vitals . Screenings to include cognitive, depression, and falls . Referrals and appointments  In addition, I have reviewed and discussed with patient certain preventive protocols, quality metrics, and best practice recommendations. A written personalized care plan for preventive services as well as general preventive health recommendations were provided to patient.   Due to this being a telephonic visit, the after visit summary with patients personalized plan was offered to patient via mail or my-chart.  Patient would like to access on my-chart.   MMarta Antu LPN   45/09/3265 Nurse Health Advisor  Nurse Notes: None

## 2020-04-14 ENCOUNTER — Ambulatory Visit (INDEPENDENT_AMBULATORY_CARE_PROVIDER_SITE_OTHER): Payer: Medicare Other

## 2020-04-14 ENCOUNTER — Other Ambulatory Visit: Payer: Self-pay

## 2020-04-14 ENCOUNTER — Ambulatory Visit: Payer: Medicare Other | Attending: Internal Medicine

## 2020-04-14 VITALS — Ht 70.0 in | Wt 232.0 lb

## 2020-04-14 DIAGNOSIS — Z0000 Encounter for general adult medical examination without abnormal findings: Secondary | ICD-10-CM

## 2020-04-14 DIAGNOSIS — Z23 Encounter for immunization: Secondary | ICD-10-CM

## 2020-04-14 NOTE — Patient Instructions (Signed)
[name] , Thank you for taking time to complete your Medicare Wellness Visit. I appreciate your ongoing commitment to your health goals. Please review the following plan we discussed and let me know if I can assist you in the future.   Screening recommendations/referrals: Colonoscopy: No longer required Recommended yearly ophthalmology/optometry visit for glaucoma screening and checkup Recommended yearly dental visit for hygiene and checkup  Vaccinations: Influenza vaccine: Up to date Pneumococcal vaccine: Completed vaccines Tdap vaccine: Discuss with pharmacy Shingles vaccine: Discuss with pharmacy   Covid-19: Completed vaccines  Advanced directives: Please bring a copy for your chart  Conditions/risks identified: See problem list  Next appointment: Follow up in one year for your annual wellness visit.   Preventive Care 78 Years and Older, Male Preventive care refers to lifestyle choices and visits with your health care provider that can promote health and wellness. What does preventive care include?  A yearly physical exam. This is also called an annual well check.  Dental exams once or twice a year.  Routine eye exams. Ask your health care provider how often you should have your eyes checked.  Personal lifestyle choices, including:  Daily care of your teeth and gums.  Regular physical activity.  Eating a healthy diet.  Avoiding tobacco and drug use.  Limiting alcohol use.  Practicing safe sex.  Taking low doses of aspirin every day.  Taking vitamin and mineral supplements as recommended by your health care provider. What happens during an annual well check? The services and screenings done by your health care provider during your annual well check will depend on your age, overall health, lifestyle risk factors, and family history of disease. Counseling  Your health care provider may ask you questions about your:  Alcohol use.  Tobacco use.  Drug  use.  Emotional well-being.  Home and relationship well-being.  Sexual activity.  Eating habits.  History of falls.  Memory and ability to understand (cognition).  Work and work Statistician. Screening  You may have the following tests or measurements:  Height, weight, and BMI.  Blood pressure.  Lipid and cholesterol levels. These may be checked every 5 years, or more frequently if you are over 84 years old.  Skin check.  Lung cancer screening. You may have this screening every year starting at age 81 if you have a 30-pack-year history of smoking and currently smoke or have quit within the past 15 years.  Fecal occult blood test (FOBT) of the stool. You may have this test every year starting at age 49.  Flexible sigmoidoscopy or colonoscopy. You may have a sigmoidoscopy every 5 years or a colonoscopy every 10 years starting at age 78.  Prostate cancer screening. Recommendations will vary depending on your family history and other risks.  Hepatitis C blood test.  Hepatitis B blood test.  Sexually transmitted disease (STD) testing.  Diabetes screening. This is done by checking your blood sugar (glucose) after you have not eaten for a while (fasting). You may have this done every 1-3 years.  Abdominal aortic aneurysm (AAA) screening. You may need this if you are a current or former smoker.  Osteoporosis. You may be screened starting at age 1 if you are at high risk. Talk with your health care provider about your test results, treatment options, and if necessary, the need for more tests. Vaccines  Your health care provider may recommend certain vaccines, such as:  Influenza vaccine. This is recommended every year.  Tetanus, diphtheria, and acellular pertussis (Tdap,  Td) vaccine. You may need a Td booster every 10 years.  Zoster vaccine. You may need this after age 63.  Pneumococcal 13-valent conjugate (PCV13) vaccine. One dose is recommended after age  25.  Pneumococcal polysaccharide (PPSV23) vaccine. One dose is recommended after age 35. Talk to your health care provider about which screenings and vaccines you need and how often you need them. This information is not intended to replace advice given to you by your health care provider. Make sure you discuss any questions you have with your health care provider. Document Released: 01/17/2015 Document Revised: 09/10/2015 Document Reviewed: 10/22/2014 Elsevier Interactive Patient Education  2017 [place] Prevention in the Home Falls can cause injuries. They can happen to people of all ages. There are many things you can do to make your home safe and to help prevent falls. What can I do on the outside of my home?  Regularly fix the edges of walkways and driveways and fix any cracks.  Remove anything that might make you trip as you walk through a door, such as a raised step or threshold.  Trim any bushes or trees on the path to your home.  Use bright outdoor lighting.  Clear any walking paths of anything that might make someone trip, such as rocks or tools.  Regularly check to see if handrails are loose or broken. Make sure that both sides of any steps have handrails.  Any raised decks and porches should have guardrails on the edges.  Have any leaves, snow, or ice cleared regularly.  Use sand or salt on walking paths during winter.  Clean up any spills in your garage right away. This includes oil or grease spills. What can I do in the bathroom?  Use night lights.  Install grab bars by the toilet and in the tub and shower. Do not use towel bars as grab bars.  Use non-skid mats or decals in the tub or shower.  If you need to sit down in the shower, use a plastic, non-slip stool.  Keep the floor dry. Clean up any water that spills on the floor as soon as it happens.  Remove soap buildup in the tub or shower regularly.  Attach bath mats securely with double-sided  non-slip rug tape.  Do not have throw rugs and other things on the floor that can make you trip. What can I do in the bedroom?  Use night lights.  Make sure that you have a light by your bed that is easy to reach.  Do not use any sheets or blankets that are too big for your bed. They should not hang down onto the floor.  Have a firm chair that has side arms. You can use this for support while you get dressed.  Do not have throw rugs and other things on the floor that can make you trip. What can I do in the kitchen?  Clean up any spills right away.  Avoid walking on wet floors.  Keep items that you use a lot in easy-to-reach places.  If you need to reach something above you, use a strong step stool that has a grab bar.  Keep electrical cords out of the way.  Do not use floor polish or wax that makes floors slippery. If you must use wax, use non-skid floor wax.  Do not have throw rugs and other things on the floor that can make you trip. What can I do with my stairs?  Do not  leave any items on the stairs.  Make sure that there are handrails on both sides of the stairs and use them. Fix handrails that are broken or loose. Make sure that handrails are as long as the stairways.  Check any carpeting to make sure that it is firmly attached to the stairs. Fix any carpet that is loose or worn.  Avoid having throw rugs at the top or bottom of the stairs. If you do have throw rugs, attach them to the floor with carpet tape.  Make sure that you have a light switch at the top of the stairs and the bottom of the stairs. If you do not have them, ask someone to add them for you. What else can I do to help prevent falls?  Wear shoes that:  Do not have high heels.  Have rubber bottoms.  Are comfortable and fit you well.  Are closed at the toe. Do not wear sandals.  If you use a stepladder:  Make sure that it is fully opened. Do not climb a closed stepladder.  Make sure that both  sides of the stepladder are locked into place.  Ask someone to hold it for you, if possible.  Clearly mark and make sure that you can see:  Any grab bars or handrails.  First and last steps.  Where the edge of each step is.  Use tools that help you move around (mobility aids) if they are needed. These include:  Canes.  Walkers.  Scooters.  Crutches.  Turn on the lights when you go into a dark area. Replace any light bulbs as soon as they burn out.  Set up your furniture so you have a clear path. Avoid moving your furniture around.  If any of your floors are uneven, fix them.  If there are any pets around you, be aware of where they are.  Review your medicines with your doctor. Some medicines can make you feel dizzy. This can increase your chance of falling. Ask your doctor what other things that you can do to help prevent falls. This information is not intended to replace advice given to you by your health care provider. Make sure you discuss any questions you have with your health care provider. Document Released: 10/17/2008 Document Revised: 05/29/2015 Document Reviewed: 01/25/2014 Elsevier Interactive Patient Education  2017 [employer].

## 2020-04-14 NOTE — Progress Notes (Signed)
   Covid-19 Vaccination Clinic  Name:  JESAIAH FABIANO    MRN: [id number] DOB: 1945/09/30  04/14/2020  Mr. Arnone was observed post Covid-19 immunization for 15 minutes without incident. He was provided with Vaccine Information Sheet and instruction to access the V-Safe system.   Mr. Lindeman was instructed to call 911 with any severe reactions post vaccine: [name] Difficulty breathing  . Swelling of face and throat  . A fast heartbeat  . A bad rash all over body  . Dizziness and weakness

## 2020-04-18 ENCOUNTER — Other Ambulatory Visit (HOSPITAL_BASED_OUTPATIENT_CLINIC_OR_DEPARTMENT_OTHER): Payer: Self-pay

## 2020-04-18 MED ORDER — PFIZER-BIONT COVID-19 VAC-TRIS 30 MCG/0.3ML IM SUSP
INTRAMUSCULAR | 0 refills | Status: DC
  Filled 2020-04-18: qty 0.3, 1d supply, fill #0

## 2020-07-02 ENCOUNTER — Encounter: Payer: Self-pay | Admitting: *Deleted

## 2020-07-04 ENCOUNTER — Other Ambulatory Visit: Payer: Self-pay | Admitting: Family Medicine

## 2020-07-14 ENCOUNTER — Telehealth: Payer: Self-pay | Admitting: *Deleted

## 2020-07-14 NOTE — Chronic Care Management (AMB) (Signed)
  Chronic Care Management   Outreach Note  07/14/2020 Name: [name] MRN: [id number] DOB: [birth year]  [name] is a 75 y.o. year old male who is a primary care patient of Birdie Riddle, Aundra Millet, MD. I reached out to [name] by phone today in response to a referral sent by [name]'s PCP Midge Minium, MD     An unsuccessful telephone outreach was attempted today. The patient was referred to the case management team for assistance with care management and care coordination.   Follow Up Plan: A HIPAA compliant phone message was left for the patient providing contact information and requesting a return call.  If patient returns call to provider office, please advise to call Embedded Care Management Care Guide   at [place], [place] Management  Direct Dial: [phone]

## 2020-07-21 NOTE — Chronic Care Management (AMB) (Signed)
  Chronic Care Management   Outreach Note  07/21/2020 Name: [name] MRN: [id number] DOB: [birth year]  [name] is a 75 y.o. year old male who is a primary care patient of Birdie Riddle, Aundra Millet, MD. I reached out to [name] by phone today in response to a referral sent by [name]'s PCP Midge Minium, MD     A second unsuccessful telephone outreach was attempted today. The patient was referred to the case management team for assistance with care management and care coordination.   Follow Up Plan: A HIPAA compliant phone message was left for the patient providing contact information and requesting a return call.  If patient returns call to provider office, please advise to call Embedded Care Management Care Guide   at [place], [place] Management  Direct Dial: [phone]

## 2020-07-28 NOTE — Chronic Care Management (AMB) (Signed)
  Chronic Care Management   Note  07/28/2020 Name: [name] MRN: [id number] DOB: July 25, 1945  [name] is a 75 y.o. year old male who is a primary care patient of Birdie Riddle, Aundra Millet, MD. I reached out to Rosetta Posner by phone today in response to a referral sent by [name]'s PCP Midge Minium, MD     Mr. Feeny was given information about Chronic Care Management services today including:  CCM service includes personalized support from designated clinical staff supervised by his physician, including individualized plan of care and coordination with other care providers 24/7 contact phone numbers for assistance for urgent and routine care needs. Service will only be billed when office clinical staff spend 20 minutes or more in a month to coordinate care. Only one practitioner may furnish and bill the service in a calendar month. The patient may stop CCM services at any time (effective at the end of the month) by phone call to the office staff. The patient will be responsible for cost sharing (co-pay) of up to 20% of the service fee (after annual deductible is met).  Patient did not agree to enrollment in care management services and does not wish to consider at this time.  Follow up plan: Patient declines further follow up and engagement by the care management team. Appropriate care team members and provider have been notified via electronic communication.   Julian Hy, [place] Management  Direct Dial: [phone]

## 2020-08-07 ENCOUNTER — Other Ambulatory Visit: Payer: Self-pay | Admitting: Family Medicine

## 2020-08-18 ENCOUNTER — Other Ambulatory Visit: Payer: Self-pay | Admitting: Family Medicine

## 2020-08-18 DIAGNOSIS — K219 Gastro-esophageal reflux disease without esophagitis: Secondary | ICD-10-CM

## 2020-09-24 ENCOUNTER — Encounter: Payer: Self-pay | Admitting: Family Medicine

## 2020-09-24 ENCOUNTER — Other Ambulatory Visit: Payer: Self-pay

## 2020-09-24 ENCOUNTER — Ambulatory Visit (INDEPENDENT_AMBULATORY_CARE_PROVIDER_SITE_OTHER): Payer: Medicare Other | Admitting: Family Medicine

## 2020-09-24 DIAGNOSIS — Z23 Encounter for immunization: Secondary | ICD-10-CM | POA: Diagnosis not present

## 2020-10-05 ENCOUNTER — Other Ambulatory Visit: Payer: Self-pay | Admitting: Family Medicine

## 2020-10-05 DIAGNOSIS — E785 Hyperlipidemia, unspecified: Secondary | ICD-10-CM

## 2020-10-07 ENCOUNTER — Encounter: Payer: Self-pay | Admitting: Family Medicine

## 2020-10-07 ENCOUNTER — Other Ambulatory Visit: Payer: Self-pay

## 2020-10-07 ENCOUNTER — Ambulatory Visit (INDEPENDENT_AMBULATORY_CARE_PROVIDER_SITE_OTHER): Payer: Medicare Other | Admitting: Family Medicine

## 2020-10-07 VITALS — BP 124/82 | HR 89 | Temp 98.0°F | Resp 16 | Ht 70.0 in | Wt 230.2 lb

## 2020-10-07 DIAGNOSIS — E119 Type 2 diabetes mellitus without complications: Secondary | ICD-10-CM | POA: Diagnosis not present

## 2020-10-07 DIAGNOSIS — E785 Hyperlipidemia, unspecified: Secondary | ICD-10-CM

## 2020-10-07 DIAGNOSIS — I10 Essential (primary) hypertension: Secondary | ICD-10-CM | POA: Diagnosis not present

## 2020-10-07 LAB — CBC WITH DIFFERENTIAL/PLATELET
Basophils Absolute: 0 10*3/uL (ref 0.0–0.1)
Basophils Relative: 0.4 % (ref 0.0–3.0)
Eosinophils Absolute: 0.3 10*3/uL (ref 0.0–0.7)
Eosinophils Relative: 3.9 % (ref 0.0–5.0)
HCT: 43.5 % (ref 39.0–52.0)
Hemoglobin: 14.4 g/dL (ref 13.0–17.0)
Lymphocytes Relative: 24.9 % (ref 12.0–46.0)
Lymphs Abs: 1.9 10*3/uL (ref 0.7–4.0)
MCHC: 33.2 g/dL (ref 30.0–36.0)
MCV: 92.5 fl (ref 78.0–100.0)
Monocytes Absolute: 0.7 10*3/uL (ref 0.1–1.0)
Monocytes Relative: 9.6 % (ref 3.0–12.0)
Neutro Abs: 4.6 10*3/uL (ref 1.4–7.7)
Neutrophils Relative %: 61.2 % (ref 43.0–77.0)
Platelets: 260 10*3/uL (ref 150.0–400.0)
RBC: 4.7 Mil/uL (ref 4.22–5.81)
RDW: 14.1 % (ref 11.5–15.5)
WBC: 7.5 10*3/uL (ref 4.0–10.5)

## 2020-10-07 LAB — BASIC METABOLIC PANEL
BUN: 19 mg/dL (ref 6–23)
CO2: 24 mEq/L (ref 19–32)
Calcium: 9.1 mg/dL (ref 8.4–10.5)
Chloride: 106 mEq/L (ref 96–112)
Creatinine, Ser: 1.07 mg/dL (ref 0.40–1.50)
GFR: 67.75 mL/min (ref >60.00)
Glucose, Bld: 109 mg/dL — ABNORMAL HIGH (ref 70–99)
Potassium: 4.2 mEq/L (ref 3.5–5.1)
Sodium: 139 mEq/L (ref 135–145)

## 2020-10-07 LAB — TSH: TSH: 1.03 u[IU]/mL (ref 0.35–5.50)

## 2020-10-07 LAB — HEPATIC FUNCTION PANEL
ALT: 31 U/L (ref 0–53)
AST: 23 U/L (ref 0–37)
Albumin: 4.1 g/dL (ref 3.5–5.2)
Alkaline Phosphatase: 41 U/L (ref 39–117)
Bilirubin, Direct: 0.1 mg/dL (ref 0.0–0.3)
Total Bilirubin: 0.5 mg/dL (ref 0.2–1.2)
Total Protein: 6.4 g/dL (ref 6.0–8.3)

## 2020-10-07 LAB — LIPID PANEL
Cholesterol: 104 mg/dL (ref 0–200)
HDL: 41.6 mg/dL (ref >39.00)
LDL Cholesterol: 39 mg/dL (ref 0–99)
NonHDL: 62.25
Total CHOL/HDL Ratio: 2
Triglycerides: 117 mg/dL (ref 0.0–149.0)
VLDL: 23.4 mg/dL (ref 0.0–40.0)

## 2020-10-07 LAB — HEMOGLOBIN A1C: Hgb A1c MFr Bld: 6.1 % (ref 4.6–6.5)

## 2020-10-07 MED ORDER — SIMVASTATIN 40 MG PO TABS: ORAL_TABLET | ORAL | 2 refills | Status: DC

## 2020-10-07 MED ORDER — ESOMEPRAZOLE MAGNESIUM 40 MG PO CPDR: 40.0000 mg | DELAYED_RELEASE_CAPSULE | Freq: Two times a day (BID) | ORAL | 1 refills | Status: DC

## 2020-10-07 NOTE — Assessment & Plan Note (Signed)
Chronic problem.  Tolerating statin w/o difficulty.  Check labs.  Adjust meds prn  

## 2020-10-07 NOTE — Assessment & Plan Note (Signed)
Chronic problem.  Currently well controlled on Losartan.  Check labs due to ARB.  No anticipated med changes.

## 2020-10-07 NOTE — Assessment & Plan Note (Signed)
Chronic problem.  Hx of good control.  UTD on foot exam, on ARB, has yearly eye exam upcoming.  Check labs.  Adjust meds prn.

## 2020-10-07 NOTE — Patient Instructions (Signed)
Follow up in 6 months to recheck diabetes, BP, and cholesterol We'll notify you of your lab results and make any changes if needed Continue to work on healthy diet and regular exercise- you can do it! Call with any questions or concerns Stay Safe!  Stay Healthy! Happy Fall!!

## 2020-10-07 NOTE — Progress Notes (Signed)
   Subjective:    Patient ID: [name], male    DOB: Jun 17, 1945, 74 y.o.   MRN: [id number]  HPI HTN- chronic problem, on Losartan 100mg  daily w/ good control.  No CP, SOB, HAs, visual changes, edema.  Hyperlipidemia- chronic problem, on Simvastatin 40mg  daily.  No abd pain, N/V.  DM- chronic problem, on Glimepiride 2mg  daily, Metformin 500mg  BID w/ hx of good control.  Last A1C 5.9%  On ARB for renal protection.  UTD on foot exam.  UTD on eye exam- had cataract surgery bilaterally and yearly appt upcoming (w/ Dr Delman Cheadle)  no symptomatic lows.  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  Safety protocols were in place, including screening questions prior to the visit, additional usage of staff PPE, and extensive cleaning of exam room while observing appropriate contact time as indicated for disinfecting solutions.      Objective:   Physical Exam Vitals reviewed.  Constitutional:      General: He is not in acute distress.    Appearance: Normal appearance. He is well-developed. He is obese. He is not ill-appearing.  HENT:     Head: Normocephalic and atraumatic.  Eyes:     Extraocular Movements: Extraocular movements intact.     Conjunctiva/sclera: Conjunctivae normal.     Pupils: Pupils are equal, round, and reactive to light.  Neck:     Thyroid: No thyromegaly.  Cardiovascular:     Rate and Rhythm: Normal rate and regular rhythm.     Pulses: Normal pulses.     Heart sounds: Normal heart sounds. No murmur heard. Pulmonary:     Effort: Pulmonary effort is normal. No respiratory distress.     Breath sounds: Normal breath sounds.  Abdominal:     General: Bowel sounds are normal. There is no distension.     Palpations: Abdomen is soft.  Musculoskeletal:     Cervical back: Normal range of motion and neck supple.     Right lower leg: No edema.     Left lower leg: No edema.   Lymphadenopathy:     Cervical: No cervical adenopathy.  Skin:    General: Skin is warm and dry.  Neurological:     General: No focal deficit present.     Mental Status: He is alert and oriented to person, place, and time.     Cranial Nerves: No cranial nerve deficit.  Psychiatric:        Mood and Affect: Mood normal.        Behavior: Behavior normal.          Assessment & Plan:

## 2020-10-27 ENCOUNTER — Ambulatory Visit: Payer: Medicare Other | Attending: Internal Medicine

## 2020-10-27 DIAGNOSIS — Z23 Encounter for immunization: Secondary | ICD-10-CM

## 2020-10-27 NOTE — Progress Notes (Signed)
   Covid-19 Vaccination Clinic  Name:  [name]    MRN: [id number] DOB: 03-09-1945  10/27/2020  [name] was observed post Covid-19 immunization for 15 minutes without incident. He was provided with Vaccine Information Sheet and instruction to access the V-Safe system.   [name] was instructed to call 911 with any severe reactions post vaccine: Difficulty breathing  Swelling of face and throat  A fast heartbeat  A bad rash all over body  Dizziness and weakness   Immunizations Administered     Name Date Dose VIS Date Route   Pfizer Covid-19 Vaccine Bivalent Booster 10/27/2020  9:04 AM 0.3 mL 09/03/2020 Intramuscular   Manufacturer: [place]   Lot: [id number]   [place]: [phone]

## 2020-11-21 ENCOUNTER — Other Ambulatory Visit (HOSPITAL_BASED_OUTPATIENT_CLINIC_OR_DEPARTMENT_OTHER): Payer: Self-pay

## 2020-11-21 DIAGNOSIS — Z23 Encounter for immunization: Secondary | ICD-10-CM | POA: Diagnosis not present

## 2020-11-21 MED ORDER — PFIZER COVID-19 VAC BIVALENT 30 MCG/0.3ML IM SUSP
INTRAMUSCULAR | 0 refills | Status: DC
  Filled 2020-11-21: qty 0.3, 1d supply, fill #0

## 2021-01-03 ENCOUNTER — Other Ambulatory Visit: Payer: Self-pay | Admitting: Family Medicine

## 2021-02-02 ENCOUNTER — Other Ambulatory Visit: Payer: Self-pay | Admitting: Family Medicine

## 2021-02-04 DIAGNOSIS — N5201 Erectile dysfunction due to arterial insufficiency: Secondary | ICD-10-CM | POA: Diagnosis not present

## 2021-02-04 DIAGNOSIS — R972 Elevated prostate specific antigen [PSA]: Secondary | ICD-10-CM | POA: Diagnosis not present

## 2021-02-04 DIAGNOSIS — N312 Flaccid neuropathic bladder, not elsewhere classified: Secondary | ICD-10-CM | POA: Diagnosis not present

## 2021-02-04 DIAGNOSIS — N401 Enlarged prostate with lower urinary tract symptoms: Secondary | ICD-10-CM | POA: Diagnosis not present

## 2021-02-10 ENCOUNTER — Encounter: Payer: Self-pay | Admitting: Family Medicine

## 2021-02-10 DIAGNOSIS — E119 Type 2 diabetes mellitus without complications: Secondary | ICD-10-CM | POA: Diagnosis not present

## 2021-02-10 DIAGNOSIS — H43813 Vitreous degeneration, bilateral: Secondary | ICD-10-CM | POA: Diagnosis not present

## 2021-02-10 DIAGNOSIS — Z961 Presence of intraocular lens: Secondary | ICD-10-CM | POA: Diagnosis not present

## 2021-02-10 DIAGNOSIS — H524 Presbyopia: Secondary | ICD-10-CM | POA: Diagnosis not present

## 2021-02-10 LAB — HM DIABETES EYE EXAM

## 2021-02-13 DIAGNOSIS — M5451 Vertebrogenic low back pain: Secondary | ICD-10-CM | POA: Diagnosis not present

## 2021-02-13 DIAGNOSIS — M7062 Trochanteric bursitis, left hip: Secondary | ICD-10-CM | POA: Diagnosis not present

## 2021-02-17 ENCOUNTER — Encounter: Payer: Self-pay | Admitting: Family Medicine

## 2021-02-25 ENCOUNTER — Other Ambulatory Visit: Payer: Self-pay | Admitting: Family Medicine

## 2021-02-25 DIAGNOSIS — K219 Gastro-esophageal reflux disease without esophagitis: Secondary | ICD-10-CM

## 2021-04-06 ENCOUNTER — Other Ambulatory Visit: Payer: Self-pay

## 2021-04-06 MED ORDER — ESOMEPRAZOLE MAGNESIUM 40 MG PO CPDR: 40.0000 mg | DELAYED_RELEASE_CAPSULE | Freq: Two times a day (BID) | ORAL | 1 refills | Status: DC

## 2021-04-07 ENCOUNTER — Ambulatory Visit (INDEPENDENT_AMBULATORY_CARE_PROVIDER_SITE_OTHER): Payer: Medicare Other | Admitting: Family Medicine

## 2021-04-07 ENCOUNTER — Encounter: Payer: Self-pay | Admitting: Family Medicine

## 2021-04-07 ENCOUNTER — Telehealth: Payer: Self-pay | Admitting: Family Medicine

## 2021-04-07 ENCOUNTER — Other Ambulatory Visit: Payer: Self-pay

## 2021-04-07 VITALS — BP 130/78 | HR 83 | Temp 97.7°F | Resp 17 | Ht 70.0 in | Wt 228.0 lb

## 2021-04-07 DIAGNOSIS — E785 Hyperlipidemia, unspecified: Secondary | ICD-10-CM

## 2021-04-07 DIAGNOSIS — E119 Type 2 diabetes mellitus without complications: Secondary | ICD-10-CM | POA: Diagnosis not present

## 2021-04-07 DIAGNOSIS — I10 Essential (primary) hypertension: Secondary | ICD-10-CM

## 2021-04-07 LAB — HEPATIC FUNCTION PANEL
ALT: 28 U/L (ref 0–53)
AST: 21 U/L (ref 0–37)
Albumin: 4.3 g/dL (ref 3.5–5.2)
Alkaline Phosphatase: 37 U/L — ABNORMAL LOW (ref 39–117)
Bilirubin, Direct: 0.1 mg/dL (ref 0.0–0.3)
Total Bilirubin: 0.4 mg/dL (ref 0.2–1.2)
Total Protein: 6.3 g/dL (ref 6.0–8.3)

## 2021-04-07 LAB — CBC WITH DIFFERENTIAL/PLATELET
Basophils Absolute: 0 10*3/uL (ref 0.0–0.1)
Basophils Relative: 0.2 % (ref 0.0–3.0)
Eosinophils Absolute: 0.2 10*3/uL (ref 0.0–0.7)
Eosinophils Relative: 3.1 % (ref 0.0–5.0)
HCT: 43.6 % (ref 39.0–52.0)
Hemoglobin: 14.6 g/dL (ref 13.0–17.0)
Lymphocytes Relative: 23.5 % (ref 12.0–46.0)
Lymphs Abs: 1.9 10*3/uL (ref 0.7–4.0)
MCHC: 33.4 g/dL (ref 30.0–36.0)
MCV: 92.1 fl (ref 78.0–100.0)
Monocytes Absolute: 0.8 10*3/uL (ref 0.1–1.0)
Monocytes Relative: 9.6 % (ref 3.0–12.0)
Neutro Abs: 5 10*3/uL (ref 1.4–7.7)
Neutrophils Relative %: 63.6 % (ref 43.0–77.0)
Platelets: 241 10*3/uL (ref 150.0–400.0)
RBC: 4.74 Mil/uL (ref 4.22–5.81)
RDW: 14.9 % (ref 11.5–15.5)
WBC: 7.9 10*3/uL (ref 4.0–10.5)

## 2021-04-07 LAB — LIPID PANEL
Cholesterol: 108 mg/dL (ref 0–200)
HDL: 40.4 mg/dL (ref >39.00)
LDL Cholesterol: 47 mg/dL (ref 0–99)
NonHDL: 67.14
Total CHOL/HDL Ratio: 3
Triglycerides: 101 mg/dL (ref 0.0–149.0)
VLDL: 20.2 mg/dL (ref 0.0–40.0)

## 2021-04-07 LAB — BASIC METABOLIC PANEL
BUN: 25 mg/dL — ABNORMAL HIGH (ref 6–23)
CO2: 23 mEq/L (ref 19–32)
Calcium: 9.3 mg/dL (ref 8.4–10.5)
Chloride: 107 mEq/L (ref 96–112)
Creatinine, Ser: 1.18 mg/dL (ref 0.40–1.50)
GFR: 60.03 mL/min (ref >60.00)
Glucose, Bld: 88 mg/dL (ref 70–99)
Potassium: 4.5 mEq/L (ref 3.5–5.1)
Sodium: 141 mEq/L (ref 135–145)

## 2021-04-07 LAB — MICROALBUMIN / CREATININE URINE RATIO
Creatinine,U: 161.8 mg/dL
Microalb Creat Ratio: 2.3 mg/g (ref 0.0–30.0)
Microalb, Ur: 3.8 mg/dL — ABNORMAL HIGH (ref 0.0–1.9)

## 2021-04-07 LAB — HEMOGLOBIN A1C: Hgb A1c MFr Bld: 6.1 % (ref 4.6–6.5)

## 2021-04-07 LAB — TSH: TSH: 0.86 u[IU]/mL (ref 0.35–5.50)

## 2021-04-07 MED ORDER — FLUOCINOLONE ACETONIDE 0.01 % OT OIL: 5.0000 [drp] | TOPICAL_OIL | Freq: Two times a day (BID) | OTIC | 0 refills | Status: DC

## 2021-04-07 MED ORDER — ESOMEPRAZOLE MAGNESIUM 40 MG PO CPDR: 40.0000 mg | DELAYED_RELEASE_CAPSULE | Freq: Two times a day (BID) | ORAL | 1 refills | Status: DC

## 2021-04-07 NOTE — Assessment & Plan Note (Signed)
Chronic problem.  Tolerating Simvastatin 40mg daily w/o difficulty.  Check labs.  Adjust meds prn  

## 2021-04-07 NOTE — Patient Instructions (Signed)
Follow up in 6 months to recheck BP, cholesterol, diabetes ?We'll notify you of your lab results and make any changes if needed ?Continue to work on healthy diet and regular exercise- you can do it!! ?Call with any questions or concerns ?Stay Safe!  Stay Healthy! ?Happy Spring!! ?

## 2021-04-07 NOTE — Assessment & Plan Note (Signed)
Chronic problem.  Currently controlled on Metformin '500mg'$  BID and Glimepiride '2mg'$  daily.  UTD on eye exam.  Foot exam done today.  Microalbumin ordered (even though pt is already on Losartan for renal protection).  Check labs.  Adjust meds prn  ?

## 2021-04-07 NOTE — Telephone Encounter (Signed)
Rx sent to pharmacy   

## 2021-04-07 NOTE — Progress Notes (Signed)
? ?  Subjective:  ? ? Patient ID: [name], male    DOB: December 24, 1945, 76 y.o.   MRN: [id number] ? ?HPI ?HTN- chronic problem, on Losartan '100mg'$  daily w/ good control.  No CP, SOB, HAs, visual changes, edema ? ?Hyperlipidemia- chronic problem, on Simvastatin '40mg'$ .  No abd pain, N/V. ? ?DM- chronic problem, on Metformin '500mg'$  BID, Glimepiride '2mg'$  daily w/ hx of good control.  UTD on eye exam.  Due for foot exam, microalbumin.  No symptomatic lows.  No numbness/tingling of hands/feet.  No blisters/wounds/sores on feet. ? ? ?Review of Systems ?For ROS see HPI  ? ?This visit occurred during the SARS-CoV-2 public health emergency.  Safety protocols were in place, including screening questions prior to the visit, additional usage of staff PPE, and extensive cleaning of exam room while observing appropriate contact time as indicated for disinfecting solutions.   ?   ?Objective:  ? Physical Exam ?Vitals reviewed.  ?Constitutional:   ?   General: He is not in acute distress. ?   Appearance: Normal appearance. He is well-developed. He is obese. He is not ill-appearing.  ?HENT:  ?   Head: Normocephalic and atraumatic.  ?Eyes:  ?   Extraocular Movements: Extraocular movements intact.  ?   Conjunctiva/sclera: Conjunctivae normal.  ?   Pupils: Pupils are equal, round, and reactive to light.  ?Neck:  ?   Thyroid: No thyromegaly.  ?Cardiovascular:  ?   Rate and Rhythm: Normal rate and regular rhythm.  ?   Pulses: Normal pulses.  ?   Heart sounds: Normal heart sounds. No murmur heard. ?Pulmonary:  ?   Effort: Pulmonary effort is normal. No respiratory distress.  ?   Breath sounds: Normal breath sounds.  ?Abdominal:  ?   General: Bowel sounds are normal. There is no distension.  ?   Palpations: Abdomen is soft.  ?Musculoskeletal:  ?   Cervical back: Normal range of motion and neck supple.  ?   Right lower leg: No edema.  ?   Left lower leg: No edema.  ?Lymphadenopathy:  ?   Cervical: No cervical adenopathy.  ?Skin: ?   General: Skin  is warm and dry.  ?Neurological:  ?   General: No focal deficit present.  ?   Mental Status: He is alert and oriented to person, place, and time.  ?   Cranial Nerves: No cranial nerve deficit.  ?Psychiatric:     ?   Mood and Affect: Mood normal.     ?   Behavior: Behavior normal.  ? ? ? ? ? ?   ?Assessment & Plan:  ? ? ?

## 2021-04-07 NOTE — Assessment & Plan Note (Signed)
Chronic problem.  Currently well controlled on Losartan 100mg daily.  Asymptomatic.  Check labs due to ARB but no anticipated med changes. 

## 2021-04-07 NOTE — Telephone Encounter (Signed)
Pt wanted a refill on the fluocinolone acetonide oil ?Pt uses [facility] on [address]  ?

## 2021-04-08 ENCOUNTER — Telehealth: Payer: Self-pay

## 2021-04-08 NOTE — Telephone Encounter (Signed)
-----   Message from Midge Minium, MD sent at 04/08/2021  7:19 AM EDT ----- ?Labs look great!  No changes at this time ?

## 2021-04-08 NOTE — Telephone Encounter (Signed)
Patient aware of labs.  

## 2021-04-15 DIAGNOSIS — L820 Inflamed seborrheic keratosis: Secondary | ICD-10-CM | POA: Diagnosis not present

## 2021-04-15 DIAGNOSIS — D1801 Hemangioma of skin and subcutaneous tissue: Secondary | ICD-10-CM | POA: Diagnosis not present

## 2021-04-15 DIAGNOSIS — L738 Other specified follicular disorders: Secondary | ICD-10-CM | POA: Diagnosis not present

## 2021-04-15 DIAGNOSIS — D485 Neoplasm of uncertain behavior of skin: Secondary | ICD-10-CM | POA: Diagnosis not present

## 2021-04-15 DIAGNOSIS — D2371 Other benign neoplasm of skin of right lower limb, including hip: Secondary | ICD-10-CM | POA: Diagnosis not present

## 2021-04-15 DIAGNOSIS — L720 Epidermal cyst: Secondary | ICD-10-CM | POA: Diagnosis not present

## 2021-04-15 DIAGNOSIS — L821 Other seborrheic keratosis: Secondary | ICD-10-CM | POA: Diagnosis not present

## 2021-04-15 DIAGNOSIS — L080 Pyoderma: Secondary | ICD-10-CM | POA: Diagnosis not present

## 2021-06-03 ENCOUNTER — Ambulatory Visit (INDEPENDENT_AMBULATORY_CARE_PROVIDER_SITE_OTHER): Payer: Medicare Other

## 2021-06-03 DIAGNOSIS — Z0000 Encounter for general adult medical examination without abnormal findings: Secondary | ICD-10-CM

## 2021-06-03 NOTE — Patient Instructions (Signed)
[name] , Thank you for taking time to come for your Medicare Wellness Visit. I appreciate your ongoing commitment to your health goals. Please review the following plan we discussed and let me know if I can assist you in the future.   Screening recommendations/referrals: Colonoscopy: no longer required  Recommended yearly ophthalmology/optometry visit for glaucoma screening and checkup Recommended yearly dental visit for hygiene and checkup  Vaccinations: Influenza vaccine: completed  Pneumococcal vaccine: completed  Tdap vaccine: due  Shingles vaccine: will consider     Advanced directives: yes   Conditions/risks identified: none   Next appointment: none   Preventive Care 18 Years and Older, Male Preventive care refers to lifestyle choices and visits with your health care provider that can promote health and wellness. What does preventive care include? A yearly physical exam. This is also called an annual well check. Dental exams once or twice a year. Routine eye exams. Ask your health care provider how often you should have your eyes checked. Personal lifestyle choices, including: Daily care of your teeth and gums. Regular physical activity. Eating a healthy diet. Avoiding tobacco and drug use. Limiting alcohol use. Practicing safe sex. Taking low doses of aspirin every day. Taking vitamin and mineral supplements as recommended by your health care provider. What happens during an annual well check? The services and screenings done by your health care provider during your annual well check will depend on your age, overall health, lifestyle risk factors, and family history of disease. Counseling  Your health care provider may ask you questions about your: Alcohol use. Tobacco use. Drug use. Emotional well-being. Home and relationship well-being. Sexual activity. Eating habits. History of falls. Memory and ability to understand (cognition). Work and work  Statistician. Screening  You may have the following tests or measurements: Height, weight, and BMI. Blood pressure. Lipid and cholesterol levels. These may be checked every 5 years, or more frequently if you are over 28 years old. Skin check. Lung cancer screening. You may have this screening every year starting at age 23 if you have a 30-pack-year history of smoking and currently smoke or have quit within the past 15 years. Fecal occult blood test (FOBT) of the stool. You may have this test every year starting at age 46. Flexible sigmoidoscopy or colonoscopy. You may have a sigmoidoscopy every 5 years or a colonoscopy every 10 years starting at age 52. Prostate cancer screening. Recommendations will vary depending on your family history and other risks. Hepatitis C blood test. Hepatitis B blood test. Sexually transmitted disease (STD) testing. Diabetes screening. This is done by checking your blood sugar (glucose) after you have not eaten for a while (fasting). You may have this done every 1-3 years. Abdominal aortic aneurysm (AAA) screening. You may need this if you are a current or former smoker. Osteoporosis. You may be screened starting at age 29 if you are at high risk. Talk with your health care provider about your test results, treatment options, and if necessary, the need for more tests. Vaccines  Your health care provider may recommend certain vaccines, such as: Influenza vaccine. This is recommended every year. Tetanus, diphtheria, and acellular pertussis (Tdap, Td) vaccine. You may need a Td booster every 10 years. Zoster vaccine. You may need this after age 39. Pneumococcal 13-valent conjugate (PCV13) vaccine. One dose is recommended after age 76. Pneumococcal polysaccharide (PPSV23) vaccine. One dose is recommended after age 76. Talk to your health care provider about which screenings and vaccines you need  and how often you need them. This information is not intended to replace  advice given to you by your health care provider. Make sure you discuss any questions you have with your health care provider. Document Released: 01/17/2015 Document Revised: 09/10/2015 Document Reviewed: 10/22/2014 Elsevier Interactive Patient Education  2017 [place] Prevention in the Home Falls can cause injuries. They can happen to people of all ages. There are many things you can do to make your home safe and to help prevent falls. What can I do on the outside of my home? Regularly fix the edges of walkways and driveways and fix any cracks. Remove anything that might make you trip as you walk through a door, such as a raised step or threshold. Trim any bushes or trees on the path to your home. Use bright outdoor lighting. Clear any walking paths of anything that might make someone trip, such as rocks or tools. Regularly check to see if handrails are loose or broken. Make sure that both sides of any steps have handrails. Any raised decks and porches should have guardrails on the edges. Have any leaves, snow, or ice cleared regularly. Use sand or salt on walking paths during winter. Clean up any spills in your garage right away. This includes oil or grease spills. What can I do in the bathroom? Use night lights. Install grab bars by the toilet and in the tub and shower. Do not use towel bars as grab bars. Use non-skid mats or decals in the tub or shower. If you need to sit down in the shower, use a plastic, non-slip stool. Keep the floor dry. Clean up any water that spills on the floor as soon as it happens. Remove soap buildup in the tub or shower regularly. Attach bath mats securely with double-sided non-slip rug tape. Do not have throw rugs and other things on the floor that can make you trip. What can I do in the bedroom? Use night lights. Make sure that you have a light by your bed that is easy to reach. Do not use any sheets or blankets that are too big for your bed.  They should not hang down onto the floor. Have a firm chair that has side arms. You can use this for support while you get dressed. Do not have throw rugs and other things on the floor that can make you trip. What can I do in the kitchen? Clean up any spills right away. Avoid walking on wet floors. Keep items that you use a lot in easy-to-reach places. If you need to reach something above you, use a strong step stool that has a grab bar. Keep electrical cords out of the way. Do not use floor polish or wax that makes floors slippery. If you must use wax, use non-skid floor wax. Do not have throw rugs and other things on the floor that can make you trip. What can I do with my stairs? Do not leave any items on the stairs. Make sure that there are handrails on both sides of the stairs and use them. Fix handrails that are broken or loose. Make sure that handrails are as long as the stairways. Check any carpeting to make sure that it is firmly attached to the stairs. Fix any carpet that is loose or worn. Avoid having throw rugs at the top or bottom of the stairs. If you do have throw rugs, attach them to the floor with carpet tape. Make sure that you  have a light switch at the top of the stairs and the bottom of the stairs. If you do not have them, ask someone to add them for you. What else can I do to help prevent falls? Wear shoes that: Do not have high heels. Have rubber bottoms. Are comfortable and fit you well. Are closed at the toe. Do not wear sandals. If you use a stepladder: Make sure that it is fully opened. Do not climb a closed stepladder. Make sure that both sides of the stepladder are locked into place. Ask someone to hold it for you, if possible. Clearly mark and make sure that you can see: Any grab bars or handrails. First and last steps. Where the edge of each step is. Use tools that help you move around (mobility aids) if they are needed. These  include: Canes. Walkers. Scooters. Crutches. Turn on the lights when you go into a dark area. Replace any light bulbs as soon as they burn out. Set up your furniture so you have a clear path. Avoid moving your furniture around. If any of your floors are uneven, fix them. If there are any pets around you, be aware of where they are. Review your medicines with your doctor. Some medicines can make you feel dizzy. This can increase your chance of falling. Ask your doctor what other things that you can do to help prevent falls. This information is not intended to replace advice given to you by your health care provider. Make sure you discuss any questions you have with your health care provider. Document Released: 10/17/2008 Document Revised: 05/29/2015 Document Reviewed: 01/25/2014 Elsevier Interactive Patient Education  2017 [employer].

## 2021-06-03 NOTE — Progress Notes (Signed)
Subjective:   [name] is a 76 y.o. male who presents for an Subsequent  Medicare Annual Wellness Visit.   I connected with [name]  today by telephone and verified that I am speaking with the correct person using two identifiers. Location patient: home Location provider: work Persons participating in the virtual visit: patient, provider.   I discussed the limitations, risks, security and privacy concerns of performing an evaluation and management service by telephone and the availability of in person appointments. I also discussed with the patient that there may be a patient responsible charge related to this service. The patient expressed understanding and verbally consented to this telephonic visit.    Interactive audio and video telecommunications were attempted between this provider and patient, however failed, due to patient having technical difficulties OR patient did not have access to video capability.  We continued and completed visit with audio only.    Review of Systems     Cardiac Risk Factors include: advanced age (>67mn, >>86women);diabetes mellitus     Objective:    Today's Vitals   There is no height or weight on file to calculate BMI.     06/03/2021    2:41 PM 04/14/2020    9:48 AM 04/11/2019    8:36 AM 01/26/2018    9:29 AM 05/13/2017    9:24 AM 01/25/2017    8:22 AM 05/19/2016    5:30 PM  Advanced Directives  Does Patient Have a Medical Advance Directive? _0  Yes Yes  Type of AParamed[place]of [place]urn[place]Living will [place] will Living will;Healthcare Power of Attorney Living will;Healthcare Power of Attorney Living will HFriendship Heights VillageLiving will [place]Living will  Does patient want to make changes to medical advance directive?   No - Patient declined  No - Patient declined  No - Patient declined  Copy of [place] Chart? No - copy  requested No - copy requested No - copy requested No - copy requested  No - copy requested No - copy requested    Current Medications (verified) Outpatient Encounter Medications as of 06/03/2021  Medication Sig   alfuzosin (UROXATRAL) 10 MG 24 hr tablet Take 10 mg by mouth daily with breakfast.   aspirin EC 81 MG tablet Take 1 tablet (81 mg total) by mouth daily. Resume 4 days post-op   Blood Glucose Monitoring Suppl (ONE TOUCH ULTRA 2) w/Device KIT Use as directed to test sugars 1-2 times daily. Dx E11.9   cetirizine (ZYRTEC) 10 MG tablet Take 10 mg by mouth daily.   diclofenac (VOLTAREN) 75 MG EC tablet TAKE 1 TABLET(75 MG) BY MOUTH TWICE DAILY WITH A MEAL   docusate sodium (COLACE) 100 MG capsule Take 1 capsule (100 mg total) by mouth 2 (two) times daily as needed for mild constipation.   esomeprazole (NEXIUM) 40 MG capsule Take 1 capsule (40 mg total) by mouth 2 (two) times daily before a meal.   famotidine (PEPCID) 40 MG tablet TAKE 1 TABLET(40 MG) BY MOUTH TWICE DAILY   Fluocinolone Acetonide 0.01 % OIL Place 5 drops in ear(s) 2 (two) times daily.   glimepiride (AMARYL) 2 MG tablet TAKE 1 TABLET BY MOUTH EVERY DAY BEFORE BREAKFAST   glucose blood (ONETOUCH ULTRA) test strip Use as instructed to test sugars 1-2 times daily. Dx. E11.9   losartan (COZAAR) 100 MG tablet TAKE 1 TABLET(100 MG) BY MOUTH DAILY   metFORMIN (  GLUCOPHAGE) 500 MG tablet TAKE 1 TABLET(500 MG) BY MOUTH TWICE DAILY WITH A MEAL   multivitamin (THERAGRAN) per tablet Take 1 tablet by mouth daily.   Probiotic Product (DIGESTIVE ADVANTAGE GUMMIES PO)    simvastatin (ZOCOR) 40 MG tablet TAKE 1 TABLET BY MOUTH DAILY IN THE EVENING   No facility-administered encounter medications on file as of 06/03/2021.    Allergies (verified) Ciprofloxacin and Niacin   History: Past Medical History:  Diagnosis Date   Anxiety    BPH (benign prostatic hypertrophy) with urinary obstruction    Cholelithiasis    Clotting disorder (HCC)     Complication of anesthesia    urinary retention ; "bladder didnt wake up"; says last gallbladder surgery he shouldve been cathed with his med hx   Diabetes mellitus    Type II   Diverticulosis of colon    DJD (degenerative joint disease)    GERD (gastroesophageal reflux disease)    Hemorrhoids    History of colonic polyps    History of DVT (deep vein thrombosis)    History of kidney stones    Hyperlipidemia    Hypertension, benign    Low back pain syndrome    Nephrolithiasis    pulmonary nodule, left lower lobe    Spinal stenosis    Varicose veins of lower extremities    Venous insufficiency    Past Surgical History:  Procedure Laterality Date   APPENDECTOMY     bilateral inguinal hernia repairs     CERVICAL DISC ARTHROPLASTY     CHOLECYSTECTOMY  03/07/2012   CHOLECYSTECTOMY N/A 03/07/2012   Procedure: LAPAROSCOPIC CHOLECYSTECTOMY;  Surgeon: Rolm Bookbinder, MD;  Location: [facility];  Service: General;  Laterality: N/A;   COLONOSCOPY  07/2008   HERNIA REPAIR     ventral hernia with mesh 1990s   JOINT REPLACEMENT     left ankle surgery with plate and screws  40/9811   by Dr. Sharol Given   LITHOTRIPSY     LUMBAR LAMINECTOMY/DECOMPRESSION MICRODISCECTOMY N/A 05/19/2016   Procedure: Microlumbar decompression L3-4, L4-5;  Surgeon: Susa Day, MD;  Location: [facility];  Service: Orthopedics;  Laterality: N/A;   LUMBAR LAMINECTOMY/DECOMPRESSION MICRODISCECTOMY N/A 05/19/2017   Procedure: Microlumbar decompression Lumbar five-Sacral one;  Surgeon: Susa Day, MD;  Location: [facility];  Service: Orthopedics;  Laterality: N/A;   sebaceous cyst surgery     t1-t2 disc surgery  2007   by Dr. Louanne Skye   TONSILLECTOMY     TOTAL KNEE ARTHROPLASTY     right and left   Family History  Problem Relation Age of Onset   Gout Father    Coronary artery disease Father 65       died of massive MI age 72   Heart attack Father    Diabetes Mother    CAD Brother 5   Colon cancer Neg Hx    Colon polyps  Neg Hx    Esophageal cancer Neg Hx    Rectal cancer Neg Hx    Stomach cancer Neg Hx    Social History   Socioeconomic History   Marital status: Married    Spouse name: Not on file   Number of children: 2   Years of education: Not on file   Highest education level: Not on file  Occupational History   Occupation: retired  Tobacco Use   Smoking status: Never   Smokeless tobacco: Never  Vaping Use   Vaping Use: Never used  Substance and Sexual Activity   Alcohol use:  Not Currently    Comment: social  use   Drug use: No   Sexual activity: Not on file  Other Topics Concern   Not on file  Social History Narrative   Retired.   Social Determinants of Health   Financial Resource Strain: Low Risk    Difficulty of Paying Living Expenses: Not hard at all  Food Insecurity: No Food Insecurity   Worried About Charity fundraiser in the Last Year: Never true   [place] in the Last Year: Never true  Transportation Needs: No Transportation Needs   Lack of Transportation (Medical): No   Lack of Transportation (Non-Medical): No  Physical Activity: Insufficiently Active   Days of Exercise per Week: 3 days   Minutes of Exercise per Session: 30 min  Stress: No Stress Concern Present   Feeling of Stress : Not at all  Social Connections: Socially Integrated   Frequency of Communication with Friends and Family: Three times a week   Frequency of Social Gatherings with Friends and Family: Three times a week   Attends Religious Services: 1 to 4 times per year   Active Member of Clubs or Organizations: Yes   Attends Archivist Meetings: 1 to 4 times per year   Marital Status: Married    Tobacco Counseling Counseling given: Not Answered   Clinical Intake:  Pre-visit preparation completed: Yes  Pain : No/denies pain     Nutritional Risks: None Diabetes: Yes CBG done?: No Did pt. bring in CBG monitor from home?: No  How often do you need to have someone help you  when you read instructions, pamphlets, or other written materials from your doctor or pharmacy?: 1 - Never What is the last grade level you completed in school?: college  Diabetic?yes Nutrition Risk Assessment:  Has the patient had any N/V/D within the last 2 months?  No  Does the patient have any non-healing wounds?  No  Has the patient had any unintentional weight loss or weight gain?  No   Diabetes:  Is the patient diabetic?  Yes  If diabetic, was a CBG obtained today?  No  Did the patient bring in their glucometer from home?  No  How often do you monitor your CBG's? Rarely .   Financial Strains and Diabetes Management:  Are you having any financial strains with the device, your supplies or your medication? No .  Does the patient want to be seen by Chronic Care Management for management of their diabetes?  No  Would the patient like to be referred to a Nutritionist or for Diabetic Management?  No   Diabetic Exams:  Diabetic Eye Exam: Completed 02/2021 Diabetic Foot Exam: Overdue, Pt has been advised about the importance in completing this exam. Pt is scheduled for diabetic foot exam on next office visit .   Interpreter Needed?: No  Information entered by :: [email or web address]   Activities of Daily Living    06/03/2021    2:46 PM  In your present state of health, do you have any difficulty performing the following activities:  Hearing? 0  Vision? 0  Difficulty concentrating or making decisions? 0  Walking or climbing stairs? 0  Dressing or bathing? 0  Doing errands, shopping? 0  Preparing Food and eating ? N  Using the Toilet? N  In the past six months, have you accidently leaked urine? N  Do you have problems with loss of bowel control? N  Managing your Medications? N  Managing your Finances? N  Housekeeping or managing your Housekeeping? N    Patient Care Team: Midge Minium, MD as PCP - General (Family Medicine) Paralee Cancel, MD as Consulting Physician  (Orthopedic Surgery) Irene Shipper, MD as Consulting Physician (Gastroenterology) Sharyne Peach, MD as Consulting Physician (Ophthalmology) [facility], Michae Kava, MD as Attending Physician Susa Day, MD as Consulting Physician (Orthopedic Surgery) Rolm Bookbinder, MD as Consulting Physician (Dermatology) [place] (Dentistry)  Indicate any recent Medical Services you may have received from other than [facility] providers in the past year (date may be approximate).     Assessment:   This is a routine wellness examination for Yannick.  Hearing/Vision screen Vision Screening - Comments:: Annual eye exams   Dietary issues and exercise activities discussed: Current Exercise Habits: Home exercise routine, Type of exercise: walking, Time (Minutes): 30, Frequency (Times/Week): 3, Weekly Exercise (Minutes/Week): 90, Intensity: Mild, Exercise limited by: None identified   Goals Addressed   None    Depression Screen    06/03/2021    2:42 PM 06/03/2021    2:40 PM 04/07/2021    9:35 AM 10/07/2020    8:39 AM 04/14/2020    9:50 AM 04/08/2020    8:38 AM 10/08/2019    8:34 AM  PHQ 2/9 Scores  PHQ - 2 Score 0 0 0 0 0 0 0  PHQ- 9 Score   0 0  0 0    Fall Risk    06/03/2021    2:42 PM 04/07/2021    9:34 AM 10/07/2020    8:39 AM 04/14/2020    9:49 AM 04/08/2020    8:38 AM  [place] in the past year? 0 0 0 0 0  Number falls in past yr: 0 0  0 0  Injury with Fall? 0 0  0 0  Risk for fall due to : No Fall Risks No Fall Risks No Fall Risks  No Fall Risks  Risk for fall due to: Comment cane      Follow up Falls evaluation completed;Education provided Falls evaluation completed Falls evaluation completed Falls prevention discussed     FALL RISK PREVENTION PERTAINING TO THE HOME:  Any stairs in or around the home? Yes  If so, are there any without handrails? No  Home free of loose throw rugs in walkways, pet beds, electrical cords, etc? Yes  Adequate lighting in your home to reduce risk of  falls? Yes   ASSISTIVE DEVICES UTILIZED TO PREVENT FALLS:  Life alert? No  Use of a cane, walker or w/c? No  Grab bars in the bathroom? Yes  Shower chair or bench in shower? Yes  Elevated toilet seat or a handicapped toilet? Yes    Cognitive Function:Normal cognitive status assessed by telephone conversation by this Nurse Health Advisor. No abnormalities found.      01/26/2018    9:36 AM 01/25/2017    8:23 AM  MMSE - Mini Mental State Exam  Orientation to time 5 5  Orientation to Place 5 5  Registration 3 3  Attention/ Calculation 3 3  Recall 3 2  Language- name 2 objects 2 2  Language- repeat 1 1  Language- follow 3 step command 3 3  Language- read & follow direction 1 1  Write a sentence 1 1  Copy design 0 1  Total score 27 27        04/11/2019    9:21 AM  6CIT Screen  What Year? 0 points  What  month? 0 points  What time? 0 points  Count back from 20 0 points  Months in reverse 0 points  Repeat phrase 0 points  Total Score 0 points    Immunizations Immunization History  Administered Date(s) Administered   Fluad Quad(high Dose 65+) 09/20/2018, 09/24/2019, 09/24/2020   H1N1 01/03/2008   Influenza Split 09/21/2010, 09/14/2011   Influenza Whole 11/05/2008, 09/15/2009   Influenza,inj,Quad PF,6+ Mos 10/05/2012, 10/26/2013, 09/27/2014, 09/23/2015, 09/29/2016, 08/30/2017   Influenza,inj,quad, With Preservative 10/04/2016   PFIZER Comirnaty(Gray Top)Covid-19 Tri-Sucrose Vaccine 04/14/2020   PFIZER(Purple Top)SARS-COV-2 Vaccination 02/09/2019, 03/06/2019, 10/16/2019   Pfizer Covid-19 Vaccine Bivalent Booster 1yr & up 10/27/2020   Pneumococcal Conjugate-13 12/18/2013   Pneumococcal Polysaccharide-23 12/26/2012   Pneumococcal-Unspecified 01/05/2015   Tdap 11/22/2008    TDAP status: Due, Education has been provided regarding the importance of this vaccine. Advised may receive this vaccine at local pharmacy or Health Dept. Aware to provide a copy of the vaccination  record if obtained from local pharmacy or Health Dept. Verbalized acceptance and understanding.  Flu Vaccine status: Up to date  Pneumococcal vaccine status: Up to date  Covid-19 vaccine status: Completed vaccines  Qualifies for Shingles Vaccine? Yes   Zostavax completed No   Shingrix Completed?: No.    Education has been provided regarding the importance of this vaccine. Patient has been advised to call insurance company to determine out of pocket expense if they have not yet received this vaccine. Advised may also receive vaccine at local pharmacy or Health Dept. Verbalized acceptance and understanding.  Screening Tests Health Maintenance  Topic Date Due   Hepatitis C Screening  Never done   TETANUS/TDAP  11/23/2018   Zoster Vaccines- Shingrix (1 of 2) 07/07/2021 (Originally 12/21/1994)   INFLUENZA VACCINE  08/04/2021   HEMOGLOBIN A1C  10/07/2021   OPHTHALMOLOGY EXAM  02/10/2022   FOOT EXAM  04/08/2022   Pneumonia Vaccine 76 Years old  Completed   COVID-19 Vaccine  Completed   HPV VACCINES  Aged Out   COLONOSCOPY (Pts 45-453yrInsurance coverage will need to be confirmed)  Discontinued    Health Maintenance  Health Maintenance Due  Topic Date Due   Hepatitis C Screening  Never done   TETANUS/TDAP  11/23/2018    Colorectal cancer screening: No longer required.   Lung Cancer Screening: (Low Dose CT Chest recommended if Age 77-80ears, 30 pack-year currently smoking OR have quit w/in 15years.) does not qualify.   Lung Cancer Screening Referral: n/a  Additional Screening:  Hepatitis C Screening: does not qualify;  Vision Screening: Recommended annual ophthalmology exams for early detection of glaucoma and other disorders of the eye. Is the patient up to date with their annual eye exam?  Yes  Who is the provider or what is the name of the office in which the patient attends annual eye exams? Dr.Gould  If pt is not established with a provider, would they like to be  referred to a provider to establish care? No .   Dental Screening: Recommended annual dental exams for proper oral hygiene  Community Resource Referral / Chronic Care Management: CRR required this visit?  No   CCM required this visit?  No      Plan:     I have personally reviewed and noted the following in the patient's chart:   Medical and social history Use of alcohol, tobacco or illicit drugs  Current medications and supplements including opioid prescriptions. Patient is not currently taking opioid prescriptions. Functional ability and status Nutritional  status Physical activity Advanced directives List of other physicians Hospitalizations, surgeries, and ER visits in previous 12 months Vitals Screenings to include cognitive, depression, and falls Referrals and appointments  In addition, I have reviewed and discussed with patient certain preventive protocols, quality metrics, and best practice recommendations. A written personalized care plan for preventive services as well as general preventive health recommendations were provided to patient.     Randel Pigg, LPN   03/15/8116   Nurse Notes: none

## 2021-07-08 ENCOUNTER — Other Ambulatory Visit: Payer: Self-pay

## 2021-07-08 MED ORDER — LOSARTAN POTASSIUM 100 MG PO TABS
ORAL_TABLET | ORAL | 1 refills | Status: DC
Start: 2021-07-08 — End: 2021-12-31

## 2021-07-30 ENCOUNTER — Other Ambulatory Visit: Payer: Self-pay

## 2021-07-30 MED ORDER — FLUOCINOLONE ACETONIDE 0.01 % OT OIL
5.0000 [drp] | TOPICAL_OIL | Freq: Two times a day (BID) | OTIC | 0 refills | Status: AC
Start: 2021-07-30 — End: ?

## 2021-07-30 MED ORDER — DOCUSATE SODIUM 100 MG PO CAPS: 100.0000 mg | ORAL_CAPSULE | Freq: Two times a day (BID) | ORAL | 1 refills | Status: AC | PRN

## 2021-07-30 MED ORDER — METFORMIN HCL 500 MG PO TABS: ORAL_TABLET | ORAL | 1 refills | Status: DC

## 2021-08-04 ENCOUNTER — Other Ambulatory Visit: Payer: Self-pay

## 2021-08-04 MED ORDER — DICLOFENAC SODIUM 75 MG PO TBEC: DELAYED_RELEASE_TABLET | ORAL | 1 refills | Status: DC

## 2021-08-31 ENCOUNTER — Other Ambulatory Visit: Payer: Self-pay

## 2021-08-31 DIAGNOSIS — K219 Gastro-esophageal reflux disease without esophagitis: Secondary | ICD-10-CM

## 2021-08-31 MED ORDER — FAMOTIDINE 40 MG PO TABS: ORAL_TABLET | ORAL | 1 refills | Status: DC

## 2021-09-28 ENCOUNTER — Ambulatory Visit: Payer: Medicare Other | Admitting: Family Medicine

## 2021-09-30 ENCOUNTER — Other Ambulatory Visit: Payer: Self-pay

## 2021-09-30 DIAGNOSIS — K219 Gastro-esophageal reflux disease without esophagitis: Secondary | ICD-10-CM

## 2021-09-30 MED ORDER — ESOMEPRAZOLE MAGNESIUM 40 MG PO CPDR: 40.0000 mg | DELAYED_RELEASE_CAPSULE | Freq: Two times a day (BID) | ORAL | 1 refills | Status: DC

## 2021-10-02 ENCOUNTER — Ambulatory Visit (INDEPENDENT_AMBULATORY_CARE_PROVIDER_SITE_OTHER): Payer: Medicare Other | Admitting: Family Medicine

## 2021-10-02 ENCOUNTER — Encounter: Payer: Self-pay | Admitting: Family Medicine

## 2021-10-02 VITALS — BP 120/80 | HR 90 | Temp 98.1°F | Resp 16 | Ht 70.0 in | Wt 231.4 lb

## 2021-10-02 DIAGNOSIS — E119 Type 2 diabetes mellitus without complications: Secondary | ICD-10-CM | POA: Diagnosis not present

## 2021-10-02 DIAGNOSIS — I10 Essential (primary) hypertension: Secondary | ICD-10-CM | POA: Diagnosis not present

## 2021-10-02 DIAGNOSIS — E785 Hyperlipidemia, unspecified: Secondary | ICD-10-CM

## 2021-10-02 DIAGNOSIS — Z23 Encounter for immunization: Secondary | ICD-10-CM

## 2021-10-02 LAB — CBC WITH DIFFERENTIAL/PLATELET
Basophils Absolute: 0 10*3/uL (ref 0.0–0.1)
Basophils Relative: 0.2 % (ref 0.0–3.0)
Eosinophils Absolute: 0.2 10*3/uL (ref 0.0–0.7)
Eosinophils Relative: 2.6 % (ref 0.0–5.0)
HCT: 42.2 % (ref 39.0–52.0)
Hemoglobin: 14 g/dL (ref 13.0–17.0)
Lymphocytes Relative: 23.1 % (ref 12.0–46.0)
Lymphs Abs: 1.6 10*3/uL (ref 0.7–4.0)
MCHC: 33.3 g/dL (ref 30.0–36.0)
MCV: 91.9 fl (ref 78.0–100.0)
Monocytes Absolute: 0.8 10*3/uL (ref 0.1–1.0)
Monocytes Relative: 11.1 % (ref 3.0–12.0)
Neutro Abs: 4.5 10*3/uL (ref 1.4–7.7)
Neutrophils Relative %: 63 % (ref 43.0–77.0)
Platelets: 253 10*3/uL (ref 150.0–400.0)
RBC: 4.59 Mil/uL (ref 4.22–5.81)
RDW: 13.7 % (ref 11.5–15.5)
WBC: 7.1 10*3/uL (ref 4.0–10.5)

## 2021-10-02 LAB — BASIC METABOLIC PANEL
BUN: 27 mg/dL — ABNORMAL HIGH (ref 6–23)
CO2: 24 mEq/L (ref 19–32)
Calcium: 9.4 mg/dL (ref 8.4–10.5)
Chloride: 106 mEq/L (ref 96–112)
Creatinine, Ser: 1.31 mg/dL (ref 0.40–1.50)
GFR: 52.77 mL/min — ABNORMAL LOW (ref >60.00)
Glucose, Bld: 71 mg/dL (ref 70–99)
Potassium: 4.6 mEq/L (ref 3.5–5.1)
Sodium: 140 mEq/L (ref 135–145)

## 2021-10-02 LAB — LIPID PANEL
Cholesterol: 100 mg/dL (ref 0–200)
HDL: 37.1 mg/dL — ABNORMAL LOW (ref >39.00)
LDL Cholesterol: 39 mg/dL (ref 0–99)
NonHDL: 62.89
Total CHOL/HDL Ratio: 3
Triglycerides: 121 mg/dL (ref 0.0–149.0)
VLDL: 24.2 mg/dL (ref 0.0–40.0)

## 2021-10-02 LAB — HEPATIC FUNCTION PANEL
ALT: 29 U/L (ref 0–53)
AST: 19 U/L (ref 0–37)
Albumin: 4.1 g/dL (ref 3.5–5.2)
Alkaline Phosphatase: 39 U/L (ref 39–117)
Bilirubin, Direct: 0.1 mg/dL (ref 0.0–0.3)
Total Bilirubin: 0.5 mg/dL (ref 0.2–1.2)
Total Protein: 6.7 g/dL (ref 6.0–8.3)

## 2021-10-02 LAB — TSH: TSH: 0.66 u[IU]/mL (ref 0.35–5.50)

## 2021-10-02 LAB — HEMOGLOBIN A1C: Hgb A1c MFr Bld: 6.1 % (ref 4.6–6.5)

## 2021-10-02 NOTE — Patient Instructions (Addendum)
Follow up in 6 months to recheck sugar, blood pressure, and cholesterol We'll notify you of your lab results and make any changes if needed Continue to work on low carb diet and regular exercise- you can do it!! Call with any questions or concerns Stay Safe!  Stay Healthy! Happy Fall!!

## 2021-10-02 NOTE — Progress Notes (Unsigned)
   Subjective:    Patient ID: [name], male    DOB: 1945/10/13, 76 y.o.   MRN: [id number]  HPI DM- chronic problem, on Metformin '500mg'$  BID and Glimepiride '2mg'$  daily.  UTD on eye exam, foot exam, microalbumin.  Denies symptomatic lows.  No change in neuropathy  HTN- chronic problem, on Losartan '100mg'$  daily.  Denies CP, SOB, HAs, visual changes, edema.  Hyperlipidemia- chronic problem, on Simvastatin '40mg'$  daily.  No abd pain, N/V.   Review of Systems For ROS see HPI     Objective:   Physical Exam Vitals reviewed.  Constitutional:      General: He is not in acute distress.    Appearance: Normal appearance. He is well-developed. He is not ill-appearing.  HENT:     Head: Normocephalic and atraumatic.  Eyes:     Extraocular Movements: Extraocular movements intact.     Conjunctiva/sclera: Conjunctivae normal.     Pupils: Pupils are equal, round, and reactive to light.  Neck:     Thyroid: No thyromegaly.  Cardiovascular:     Rate and Rhythm: Normal rate and regular rhythm.     Pulses: Normal pulses.     Heart sounds: Normal heart sounds. No murmur heard. Pulmonary:     Effort: Pulmonary effort is normal. No respiratory distress.     Breath sounds: Normal breath sounds.  Abdominal:     General: Bowel sounds are normal. There is no distension.     Palpations: Abdomen is soft.  Musculoskeletal:     Cervical back: Normal range of motion and neck supple.     Right lower leg: No edema.     Left lower leg: No edema.  Lymphadenopathy:     Cervical: No cervical adenopathy.  Skin:    General: Skin is warm and dry.  Neurological:     General: No focal deficit present.     Mental Status: He is alert and oriented to person, place, and time.     Cranial Nerves: No cranial nerve deficit.  Psychiatric:        Mood and Affect: Mood normal.        Behavior: Behavior normal.           Assessment & Plan:

## 2021-10-03 NOTE — Assessment & Plan Note (Signed)
Chronic problem.  On Simvastatin '40mg'$  daily w/o difficulty.  Check labs.  Adjust meds prn

## 2021-10-03 NOTE — Assessment & Plan Note (Signed)
Chronic problem.  Currently on Metformin '500mg'$  BID and Glimepiride '2mg'$  daily.  Hx of good control.  UTD on eye exaxm, foot exam, microalbumin.  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  Otherwise asymptomatic.  Check labs.  Adjust meds prn

## 2021-10-03 NOTE — Assessment & Plan Note (Signed)
Chronic problem.  On Losartan '100mg'$  daily w/ adequate control.  Currently asymptomatic.  Check labs due to ARB use but no anticipated med changes.

## 2021-10-05 ENCOUNTER — Other Ambulatory Visit: Payer: Self-pay

## 2021-10-05 DIAGNOSIS — R7989 Other specified abnormal findings of blood chemistry: Secondary | ICD-10-CM

## 2021-10-05 NOTE — Progress Notes (Signed)
Informed pt of lab results . BMP order is in place and lab only visit is made

## 2021-10-13 ENCOUNTER — Other Ambulatory Visit (INDEPENDENT_AMBULATORY_CARE_PROVIDER_SITE_OTHER): Payer: Medicare Other

## 2021-10-13 DIAGNOSIS — R7989 Other specified abnormal findings of blood chemistry: Secondary | ICD-10-CM | POA: Diagnosis not present

## 2021-10-13 LAB — BASIC METABOLIC PANEL
BUN: 20 mg/dL (ref 6–23)
CO2: 24 mEq/L (ref 19–32)
Calcium: 9.2 mg/dL (ref 8.4–10.5)
Chloride: 108 mEq/L (ref 96–112)
Creatinine, Ser: 1.01 mg/dL (ref 0.40–1.50)
GFR: 72.09 mL/min (ref >60.00)
Glucose, Bld: 125 mg/dL — ABNORMAL HIGH (ref 70–99)
Potassium: 4.6 mEq/L (ref 3.5–5.1)
Sodium: 141 mEq/L (ref 135–145)

## 2021-10-13 NOTE — Progress Notes (Signed)
Pt seen results via my chart  

## 2021-10-27 ENCOUNTER — Other Ambulatory Visit (HOSPITAL_BASED_OUTPATIENT_CLINIC_OR_DEPARTMENT_OTHER): Payer: Self-pay

## 2021-10-27 DIAGNOSIS — Z23 Encounter for immunization: Secondary | ICD-10-CM | POA: Diagnosis not present

## 2021-10-27 MED ORDER — BOOSTRIX 5-2.5-18.5 LF-MCG/0.5 IM SUSY
PREFILLED_SYRINGE | INTRAMUSCULAR | 0 refills | Status: DC
  Filled 2021-10-27: qty 0.5, 1d supply, fill #0

## 2021-10-27 MED ORDER — COMIRNATY 30 MCG/0.3ML IM SUSY
PREFILLED_SYRINGE | INTRAMUSCULAR | 0 refills | Status: DC
  Filled 2021-10-27: qty 0.3, 1d supply, fill #0

## 2021-10-28 ENCOUNTER — Other Ambulatory Visit: Payer: Self-pay

## 2021-10-28 DIAGNOSIS — E119 Type 2 diabetes mellitus without complications: Secondary | ICD-10-CM

## 2021-10-28 MED ORDER — GLIMEPIRIDE 2 MG PO TABS: ORAL_TABLET | ORAL | 1 refills | Status: DC

## 2021-10-28 NOTE — Progress Notes (Signed)
Received fax request to refill patients glimipride, recent visit refill was supplied

## 2021-11-03 DIAGNOSIS — M25552 Pain in left hip: Secondary | ICD-10-CM | POA: Diagnosis not present

## 2021-12-31 ENCOUNTER — Other Ambulatory Visit: Payer: Self-pay

## 2021-12-31 ENCOUNTER — Other Ambulatory Visit: Payer: Self-pay | Admitting: Family Medicine

## 2021-12-31 DIAGNOSIS — E785 Hyperlipidemia, unspecified: Secondary | ICD-10-CM

## 2021-12-31 MED ORDER — SIMVASTATIN 40 MG PO TABS: ORAL_TABLET | ORAL | 2 refills | Status: DC

## 2022-01-19 ENCOUNTER — Other Ambulatory Visit: Payer: Self-pay | Admitting: Family Medicine

## 2022-01-19 DIAGNOSIS — K219 Gastro-esophageal reflux disease without esophagitis: Secondary | ICD-10-CM

## 2022-01-28 ENCOUNTER — Other Ambulatory Visit (HOSPITAL_BASED_OUTPATIENT_CLINIC_OR_DEPARTMENT_OTHER): Payer: Self-pay

## 2022-01-28 MED ORDER — AREXVY 120 MCG/0.5ML IM SUSR
INTRAMUSCULAR | 0 refills | Status: DC
  Filled 2022-01-28: qty 1, 1d supply, fill #0

## 2022-01-30 ENCOUNTER — Other Ambulatory Visit: Payer: Self-pay | Admitting: Family Medicine

## 2022-02-05 DIAGNOSIS — R972 Elevated prostate specific antigen [PSA]: Secondary | ICD-10-CM | POA: Diagnosis not present

## 2022-02-05 DIAGNOSIS — N401 Enlarged prostate with lower urinary tract symptoms: Secondary | ICD-10-CM | POA: Diagnosis not present

## 2022-02-05 DIAGNOSIS — R3914 Feeling of incomplete bladder emptying: Secondary | ICD-10-CM | POA: Diagnosis not present

## 2022-03-05 ENCOUNTER — Telehealth (INDEPENDENT_AMBULATORY_CARE_PROVIDER_SITE_OTHER): Payer: Medicare Other | Admitting: Family

## 2022-03-05 ENCOUNTER — Telehealth: Payer: Self-pay | Admitting: Family Medicine

## 2022-03-05 DIAGNOSIS — U071 COVID-19: Secondary | ICD-10-CM

## 2022-03-05 MED ORDER — PAXLOVID (300/100) 20 X 150 MG & 10 X 100MG PO TBPK: ORAL_TABLET | ORAL | 0 refills | Status: DC

## 2022-03-05 MED ORDER — BENZONATATE 100 MG PO CAPS: 100.0000 mg | ORAL_CAPSULE | Freq: Three times a day (TID) | ORAL | 0 refills | Status: DC | PRN

## 2022-03-05 NOTE — Progress Notes (Signed)
Subjective:   By signing my name below, I, [name], attest that this documentation has been prepared under the direction and in the presence of Spencer Alar, NP. 03/05/2022   Patient ID: [name], male    DOB: 10/30/1945, 77 y.o.   MRN: [id number]  Chief Complaint  Patient presents with   Covid Positive    Tested positive for covid today   covid symptoms    Cough, congestion, runny nose and headache     HPI Patient is in today for a video visit.   Covid-19: He complains of cough, congestion, rhinorrhea, and headache since yesterday. He tested positive for Covid-19 today. His highest temperature was recorded at 99 degrees F. His wife tested positive for Covid-19 prior to him developing symptoms.   Past Medical History:  Diagnosis Date   Anxiety    BPH (benign prostatic hypertrophy) with urinary obstruction    Cholelithiasis    Clotting disorder ([place])    Complication of anesthesia    urinary retention ; "bladder didnt wake up"; says last gallbladder surgery he shouldve been cathed with his med hx   Diabetes mellitus    Type II   Diverticulosis of colon    DJD (degenerative joint disease)    GERD (gastroesophageal reflux disease)    Hemorrhoids    History of colonic polyps    History of DVT (deep vein thrombosis)    History of kidney stones    Hyperlipidemia    Hypertension, benign    Low back pain syndrome    Nephrolithiasis    pulmonary nodule, left lower lobe    Spinal stenosis    Varicose veins of lower extremities    Venous insufficiency     Past Surgical History:  Procedure Laterality Date   APPENDECTOMY     bilateral inguinal hernia repairs     CERVICAL DISC ARTHROPLASTY     CHOLECYSTECTOMY  03/07/2012   CHOLECYSTECTOMY N/A 03/07/2012   Procedure: LAPAROSCOPIC CHOLECYSTECTOMY;  Surgeon: Rolm Bookbinder, MD;  Location: [facility];  Service: General;  Laterality: N/A;   COLONOSCOPY  07/2008   HERNIA REPAIR     ventral hernia with mesh 1990s    JOINT REPLACEMENT     left ankle surgery with plate and screws  [id number]   by Dr. Sharol Given   LITHOTRIPSY     LUMBAR LAMINECTOMY/DECOMPRESSION MICRODISCECTOMY N/A 05/19/2016   Procedure: Microlumbar decompression L3-4, L4-5;  Surgeon: Susa Day, MD;  Location: [facility];  Service: Orthopedics;  Laterality: N/A;   LUMBAR LAMINECTOMY/DECOMPRESSION MICRODISCECTOMY N/A 05/19/2017   Procedure: Microlumbar decompression Lumbar five-Sacral one;  Surgeon: Susa Day, MD;  Location: [facility];  Service: Orthopedics;  Laterality: N/A;   sebaceous cyst surgery     t1-t2 disc surgery  2007   by Dr. Louanne Skye   TONSILLECTOMY     TOTAL KNEE ARTHROPLASTY     right and left    Family History  Problem Relation Age of Onset   Gout Father    Coronary artery disease Father 68       died of massive MI age 36   Heart attack Father    Diabetes Mother    CAD Brother 69   Colon cancer Neg Hx    Colon polyps Neg Hx    Esophageal cancer Neg Hx    Rectal cancer Neg Hx    Stomach cancer Neg Hx     Social History   Socioeconomic History   Marital status: Married    Spouse  name: Not on file   Number of children: 2   Years of education: Not on file   Highest education level: Not on file  Occupational History   Occupation: retired  Tobacco Use   Smoking status: Never   Smokeless tobacco: Never  Vaping Use   Vaping Use: Never used  Substance and Sexual Activity   Alcohol use: Not Currently    Comment: social  use   Drug use: No   Sexual activity: Not on file  Other Topics Concern   Not on file  Social History Narrative   Retired.   Social Determinants of Health   Financial Resource Strain: Low Risk  (06/03/2021)   Overall Financial Resource Strain (CARDIA)    Difficulty of Paying Living Expenses: Not hard at all  Food Insecurity: No Food Insecurity (06/03/2021)   Hunger Vital Sign    Worried About Running Out of Food in the Last Year: Never true    Ran Out of Food in the Last Year: Never true   Transportation Needs: No Transportation Needs (06/03/2021)   PRAPARE - Hydrologist (Medical): No    Lack of Transportation (Non-Medical): No  Physical Activity: Insufficiently Active (06/03/2021)   Exercise Vital Sign    Days of Exercise per Week: 3 days    Minutes of Exercise per Session: 30 min  Stress: No Stress Concern Present (06/03/2021)   [place]    Feeling of Stress : Not at all  Social Connections: [place] (06/03/2021)   Social Connection and Isolation Panel [NHANES]    Frequency of Communication with Friends and Family: Three times a week    Frequency of Social Gatherings with Friends and Family: Three times a week    Attends Religious Services: 1 to 4 times per year    Active Member of Clubs or Organizations: Yes    Attends Archivist Meetings: 1 to 4 times per year    Marital Status: Married  Human resources officer Violence: Not At Risk (06/03/2021)   Humiliation, Afraid, Rape, and Kick questionnaire    Fear of Current or Ex-Partner: No    Emotionally Abused: No    Physically Abused: No    Sexually Abused: No    Outpatient Medications Prior to Visit  Medication Sig Dispense Refill   alfuzosin (UROXATRAL) 10 MG 24 hr tablet Take 10 mg by mouth daily with breakfast.     aspirin EC 81 MG tablet Take 1 tablet (81 mg total) by mouth daily. Resume 4 days post-op     Blood Glucose Monitoring Suppl (ONE TOUCH ULTRA 2) w/Device KIT Use as directed to test sugars 1-2 times daily. Dx E11.9 1 kit 1   cetirizine (ZYRTEC) 10 MG tablet Take 10 mg by mouth daily.     diclofenac (VOLTAREN) 75 MG EC tablet TAKE 1 TABLET(75 MG) BY MOUTH TWICE DAILY WITH A MEAL 180 tablet 1   docusate sodium (COLACE) 100 MG capsule Take 1 capsule (100 mg total) by mouth 2 (two) times daily as needed for mild constipation. 40 capsule 1   esomeprazole (NEXIUM) 40 MG capsule Take 1 capsule (40 mg  total) by mouth 2 (two) times daily before a meal. 180 capsule 1   famotidine (PEPCID) 40 MG tablet TAKE 1 TABLET(40 MG) BY MOUTH TWICE DAILY 180 tablet 1   Fluocinolone Acetonide 0.01 % OIL Place 5 drops in ear(s) 2 (two) times daily. 20 mL 0  glimepiride (AMARYL) 2 MG tablet TAKE 1 TABLET BY MOUTH EVERY DAY BEFORE BREAKFAST 90 tablet 1   glucose blood (ONETOUCH ULTRA) test strip Use as instructed to test sugars 1-2 times daily. Dx. E11.9 100 each 12   losartan (COZAAR) 100 MG tablet TAKE 1 TABLET(100 MG) BY MOUTH DAILY 90 tablet 1   metFORMIN (GLUCOPHAGE) 500 MG tablet Take one tablet twice daily 180 tablet 1   multivitamin (THERAGRAN) per tablet Take 1 tablet by mouth daily.     Probiotic Product (DIGESTIVE ADVANTAGE GUMMIES PO)      RSV vaccine recomb adjuvanted (AREXVY) 120 MCG/0.5ML injection Inject into the muscle. 1 each 0   simvastatin (ZOCOR) 40 MG tablet TAKE 1 TABLET BY MOUTH DAILY IN THE EVENING 90 tablet 2   Tdap (BOOSTRIX) 5-2.5-18.5 LF-MCG/0.5 injection Inject into the muscle. 0.5 mL 0   COVID-19 mRNA vaccine 2023-2024 (COMIRNATY) syringe Inject into the muscle. 0.3 mL 0   No facility-administered medications prior to visit.    Allergies  Allergen Reactions   Ciprofloxacin Rash   Niacin Other (See Comments)    REACTION: hypotension, shaky    Review of Systems  Constitutional:  Negative for fever.  HENT:  Positive for congestion.        (+)rhinorrhea  Respiratory:  Positive for cough.   Neurological:  Positive for headaches.       Objective:    Physical Exam  There were no vitals taken for this visit. Wt Readings from Last 3 Encounters:  10/02/21 231 lb 6 oz (105 kg)  04/07/21 228 lb (103.4 kg)  10/07/20 230 lb 3.2 oz (104.4 kg)    Gen: Awake, alert, no acute distress Resp: Breathing is even and non-labored Psych: calm/pleasant demeanor Neuro: Alert and Oriented x 3, + facial symmetry, speech is clear.     Assessment & Plan:  COVID-19 Assessment &  Plan: Covid positive, symptoms began yesterday.  Requesting antiviral rx.  Normal GFR last check. Patient is treated with simvastatin and understands need to d/c simvastatin while taking paxlovid and for  24 hours following discontinuation. Discussed supportive measures including rest, hydration, tylenol prn, mucinex prn, nasal saline prn. Advised of CDC guidelines for self isolation/ ending isolation.  Advised of safe practice guidelines. Symptom Tier reviewed.  Encouraged to monitor for any worsening symptoms; watch for increased shortness of breath, weakness, and signs of dehydration. Advised when to seek emergency care.  Instructed to rest and hydrate well. Advised to leave the house during recommended isolation period, only if it is necessary to seek medical care. Pt verbalizes understanding of above.     Other orders -     Paxlovid (300/100); Take per package instructions  Dispense: 30 tablet; Refill: 0 -     Benzonatate; Take 1 capsule (100 mg total) by mouth 3 (three) times daily as needed.  Dispense: 20 capsule; Refill: 0    I, Nance Pear, NP, personally preformed the services described in this documentation.  All medical record entries made by the scribe were at my direction and in my presence.  I have reviewed the chart and discharge instructions (if applicable) and agree that the record reflects my personal performance and is accurate and complete. 03/05/2022   I,[name],acting as a scribe for Nance Pear, NP.,have documented all relevant documentation on the behalf of Nance Pear, NP,as directed by  Nance Pear, NP while in the presence of Nance Pear, NP.   Nance Pear, NP

## 2022-03-05 NOTE — Assessment & Plan Note (Signed)
Covid positive, symptoms began yesterday.  Requesting antiviral rx.  Normal GFR last check. Patient is treated with simvastatin and understands need to d/c simvastatin while taking paxlovid and for  24 hours following discontinuation. Discussed supportive measures including rest, hydration, tylenol prn, mucinex prn, nasal saline prn. Advised of CDC guidelines for self isolation/ ending isolation.  Advised of safe practice guidelines. Symptom Tier reviewed.  Encouraged to monitor for any worsening symptoms; watch for increased shortness of breath, weakness, and signs of dehydration. Advised when to seek emergency care.  Instructed to rest and hydrate well. Advised to leave the house during recommended isolation period, only if it is necessary to seek medical care. Pt verbalizes understanding of above.

## 2022-03-05 NOTE — Telephone Encounter (Signed)
[name] is scheduled pt a virtual visit

## 2022-03-05 NOTE — Telephone Encounter (Signed)
Caller name: OSBOURNE HOLTZCLAW  On DPR?: Yes  Call back number: [phone] (mobile)  Provider they see: Midge Minium, MD  Reason for call:Pt Covid + was told to call office. Advise

## 2022-03-09 ENCOUNTER — Other Ambulatory Visit: Payer: Self-pay

## 2022-03-09 DIAGNOSIS — E785 Hyperlipidemia, unspecified: Secondary | ICD-10-CM

## 2022-03-09 MED ORDER — SIMVASTATIN 40 MG PO TABS: ORAL_TABLET | ORAL | 1 refills | Status: DC

## 2022-03-19 DIAGNOSIS — R972 Elevated prostate specific antigen [PSA]: Secondary | ICD-10-CM | POA: Diagnosis not present

## 2022-03-29 ENCOUNTER — Other Ambulatory Visit: Payer: Self-pay

## 2022-03-29 DIAGNOSIS — K219 Gastro-esophageal reflux disease without esophagitis: Secondary | ICD-10-CM

## 2022-03-29 MED ORDER — ESOMEPRAZOLE MAGNESIUM 40 MG PO CPDR: 40.0000 mg | DELAYED_RELEASE_CAPSULE | Freq: Two times a day (BID) | ORAL | 1 refills | Status: DC

## 2022-04-02 ENCOUNTER — Ambulatory Visit: Payer: Medicare Other | Admitting: Family Medicine

## 2022-04-07 ENCOUNTER — Ambulatory Visit (INDEPENDENT_AMBULATORY_CARE_PROVIDER_SITE_OTHER): Payer: Medicare Other | Admitting: Family Medicine

## 2022-04-07 ENCOUNTER — Encounter: Payer: Self-pay | Admitting: Family Medicine

## 2022-04-07 VITALS — BP 122/82 | HR 78 | Temp 98.9°F | Resp 17 | Ht 70.0 in | Wt 233.4 lb

## 2022-04-07 DIAGNOSIS — I10 Essential (primary) hypertension: Secondary | ICD-10-CM | POA: Diagnosis not present

## 2022-04-07 DIAGNOSIS — J301 Allergic rhinitis due to pollen: Secondary | ICD-10-CM | POA: Diagnosis not present

## 2022-04-07 DIAGNOSIS — E785 Hyperlipidemia, unspecified: Secondary | ICD-10-CM

## 2022-04-07 DIAGNOSIS — E119 Type 2 diabetes mellitus without complications: Secondary | ICD-10-CM

## 2022-04-07 LAB — BASIC METABOLIC PANEL
BUN: 24 mg/dL — ABNORMAL HIGH (ref 6–23)
CO2: 26 mEq/L (ref 19–32)
Calcium: 9.4 mg/dL (ref 8.4–10.5)
Chloride: 108 mEq/L (ref 96–112)
Creatinine, Ser: 1.18 mg/dL (ref 0.40–1.50)
GFR: 59.61 mL/min — ABNORMAL LOW (ref >60.00)
Glucose, Bld: 103 mg/dL — ABNORMAL HIGH (ref 70–99)
Potassium: 4.6 mEq/L (ref 3.5–5.1)
Sodium: 139 mEq/L (ref 135–145)

## 2022-04-07 LAB — CBC WITH DIFFERENTIAL/PLATELET
Basophils Absolute: 0 10*3/uL (ref 0.0–0.1)
Basophils Relative: 0.2 % (ref 0.0–3.0)
Eosinophils Absolute: 0.3 10*3/uL (ref 0.0–0.7)
Eosinophils Relative: 3.4 % (ref 0.0–5.0)
HCT: 43 % (ref 39.0–52.0)
Hemoglobin: 14.3 g/dL (ref 13.0–17.0)
Lymphocytes Relative: 23.9 % (ref 12.0–46.0)
Lymphs Abs: 1.9 10*3/uL (ref 0.7–4.0)
MCHC: 33.2 g/dL (ref 30.0–36.0)
MCV: 92.7 fl (ref 78.0–100.0)
Monocytes Absolute: 0.9 10*3/uL (ref 0.1–1.0)
Monocytes Relative: 11 % (ref 3.0–12.0)
Neutro Abs: 4.8 10*3/uL (ref 1.4–7.7)
Neutrophils Relative %: 61.5 % (ref 43.0–77.0)
Platelets: 257 10*3/uL (ref 150.0–400.0)
RBC: 4.64 Mil/uL (ref 4.22–5.81)
RDW: 14.4 % (ref 11.5–15.5)
WBC: 7.9 10*3/uL (ref 4.0–10.5)

## 2022-04-07 LAB — LIPID PANEL
Cholesterol: 112 mg/dL (ref 0–200)
HDL: 38.2 mg/dL — ABNORMAL LOW (ref >39.00)
LDL Cholesterol: 48 mg/dL (ref 0–99)
NonHDL: 73.68
Total CHOL/HDL Ratio: 3
Triglycerides: 127 mg/dL (ref 0.0–149.0)
VLDL: 25.4 mg/dL (ref 0.0–40.0)

## 2022-04-07 LAB — HEPATIC FUNCTION PANEL
ALT: 30 U/L (ref 0–53)
AST: 21 U/L (ref 0–37)
Albumin: 4.2 g/dL (ref 3.5–5.2)
Alkaline Phosphatase: 42 U/L (ref 39–117)
Bilirubin, Direct: 0.1 mg/dL (ref 0.0–0.3)
Total Bilirubin: 0.5 mg/dL (ref 0.2–1.2)
Total Protein: 6.6 g/dL (ref 6.0–8.3)

## 2022-04-07 LAB — HEMOGLOBIN A1C: Hgb A1c MFr Bld: 6 % (ref 4.6–6.5)

## 2022-04-07 LAB — MICROALBUMIN / CREATININE URINE RATIO
Creatinine,U: 88 mg/dL
Microalb Creat Ratio: 2.1 mg/g (ref 0.0–30.0)
Microalb, Ur: 1.8 mg/dL (ref 0.0–1.9)

## 2022-04-07 LAB — TSH: TSH: 0.67 u[IU]/mL (ref 0.35–5.50)

## 2022-04-07 MED ORDER — AZELASTINE HCL 0.1 % NA SOLN: 1.0000 | Freq: Two times a day (BID) | NASAL | 5 refills | Status: DC

## 2022-04-07 NOTE — Assessment & Plan Note (Signed)
Chronic problem.  Well controlled on Losartan 100mg  daily.  Currently asymptomatic.  Check labs due to ARB use but no anticipated med changes.

## 2022-04-07 NOTE — Assessment & Plan Note (Signed)
Chronic problem.  On Simvastatin 40mg daily w/o difficulty.  Check labs.  Adjust meds prn  

## 2022-04-07 NOTE — Progress Notes (Signed)
   Subjective:    Patient ID: [name], male    DOB: 06-10-1945, 77 y.o.   MRN: [id number]  HPI DM- chronic problem, on Metformin 500mg  BID, Glimepiride 2mg  daily.  Last A1C 6.1%  Due for foot exam, eye exam, microalbumin.  Eye exam scheduled for next week.  Denies symptomatic lows.  No numbness/tingling of hands or feet.  No blisters or sores on feet.  HTN- chronic problem, on Losartan 100mg  daily w/ good control.  No CP, SOB, HA's, visual changes, edema.  Hyperlipidemia- chronic problem, on Simvastatin 40mg  daily.  No abd pain, N/V.  URI- pt reports cough, congestion, clear mucous.  Will 'sneeze 10 times'.  Currently on Zyrtec.     Review of Systems For ROS see HPI     Objective:   Physical Exam Vitals reviewed.  Constitutional:      General: He is not in acute distress.    Appearance: Normal appearance. He is well-developed. He is obese.  HENT:     Head: Normocephalic and atraumatic.     Mouth/Throat:     Mouth: Mucous membranes are moist.     Comments: Copious PND Eyes:     Extraocular Movements: Extraocular movements intact.     Conjunctiva/sclera: Conjunctivae normal.     Pupils: Pupils are equal, round, and reactive to light.  Neck:     Thyroid: No thyromegaly.  Cardiovascular:     Rate and Rhythm: Normal rate and regular rhythm.     Pulses: Normal pulses.     Heart sounds: Normal heart sounds. No murmur heard. Pulmonary:     Effort: Pulmonary effort is normal. No respiratory distress.     Breath sounds: Normal breath sounds.  Abdominal:     General: Bowel sounds are normal. There is no distension.     Palpations: Abdomen is soft.  Musculoskeletal:     Cervical back: Normal range of motion and neck supple.     Right lower leg: No edema.     Left lower leg: No edema.  Lymphadenopathy:     Cervical: No cervical adenopathy.  Skin:    General: Skin is warm and dry.  Neurological:     General: No focal deficit present.     Mental Status: He is alert and  oriented to person, place, and time.     Cranial Nerves: No cranial nerve deficit.  Psychiatric:        Mood and Affect: Mood normal.        Behavior: Behavior normal.           Assessment & Plan:

## 2022-04-07 NOTE — Assessment & Plan Note (Signed)
Chronic problem.  Hx of good control on Metformin and Glimepiride.  Eye exam scheduled for next week, foot exam, done today, microalbumin ordered.  Check labs.  Adjust meds prn

## 2022-04-07 NOTE — Patient Instructions (Addendum)
Follow up in 6 months to recheck diabetes, BP, and cholesterol We'll notify you of your lab results and make any changes if needed Continue to work on healthy diet and regular exercise- you can do it! ADD the Astelin nasal spray to help w/ the congestion and drainage CONTINUE the Zyrtec daily Call with any questions or concerns Stay Safe!  Stay Healthy! Hang in there!!!

## 2022-04-07 NOTE — Assessment & Plan Note (Signed)
Deteriorated.  Add Astelin to his daily Zyrtec.  Pt expressed understanding and is in agreement w/ plan.

## 2022-04-08 ENCOUNTER — Telehealth: Payer: Self-pay

## 2022-04-08 NOTE — Telephone Encounter (Signed)
-----   Message from Midge Minium, MD sent at 04/08/2022  7:37 AM EDT ----- Labs look great!  No changes at this time

## 2022-04-08 NOTE — Telephone Encounter (Signed)
Pt aware of lab results 

## 2022-04-14 DIAGNOSIS — Z961 Presence of intraocular lens: Secondary | ICD-10-CM | POA: Diagnosis not present

## 2022-04-14 DIAGNOSIS — H26493 Other secondary cataract, bilateral: Secondary | ICD-10-CM | POA: Diagnosis not present

## 2022-04-14 DIAGNOSIS — H524 Presbyopia: Secondary | ICD-10-CM | POA: Diagnosis not present

## 2022-04-14 DIAGNOSIS — E119 Type 2 diabetes mellitus without complications: Secondary | ICD-10-CM | POA: Diagnosis not present

## 2022-04-14 LAB — HM DIABETES EYE EXAM

## 2022-04-16 ENCOUNTER — Other Ambulatory Visit: Payer: Self-pay | Admitting: Family Medicine

## 2022-04-19 DIAGNOSIS — R972 Elevated prostate specific antigen [PSA]: Secondary | ICD-10-CM | POA: Diagnosis not present

## 2022-04-26 ENCOUNTER — Other Ambulatory Visit: Payer: Self-pay | Admitting: Urology

## 2022-04-26 DIAGNOSIS — R972 Elevated prostate specific antigen [PSA]: Secondary | ICD-10-CM

## 2022-05-11 ENCOUNTER — Other Ambulatory Visit: Payer: Self-pay

## 2022-05-11 DIAGNOSIS — R972 Elevated prostate specific antigen [PSA]: Secondary | ICD-10-CM | POA: Diagnosis not present

## 2022-05-11 MED ORDER — METFORMIN HCL 500 MG PO TABS: ORAL_TABLET | ORAL | 1 refills | Status: DC

## 2022-05-20 ENCOUNTER — Ambulatory Visit
Admission: RE | Admit: 2022-05-20 | Discharge: 2022-05-20 | Disposition: A | Discharge status: Home / Self Care | Payer: Medicare Other | Source: Ambulatory Visit | Attending: Urology | Admitting: Urology

## 2022-05-20 DIAGNOSIS — R972 Elevated prostate specific antigen [PSA]: Secondary | ICD-10-CM

## 2022-05-20 DIAGNOSIS — N401 Enlarged prostate with lower urinary tract symptoms: Secondary | ICD-10-CM | POA: Diagnosis not present

## 2022-05-20 DIAGNOSIS — K5730 Diverticulosis of large intestine without perforation or abscess without bleeding: Secondary | ICD-10-CM | POA: Diagnosis not present

## 2022-05-20 MED ORDER — GADOPICLENOL 0.5 MMOL/ML IV SOLN
10.0000 mL | Freq: Once | INTRAVENOUS | Status: AC | PRN
  Administered 2022-05-20: 10 mL via INTRAVENOUS

## 2022-05-26 DIAGNOSIS — N401 Enlarged prostate with lower urinary tract symptoms: Secondary | ICD-10-CM | POA: Diagnosis not present

## 2022-05-26 DIAGNOSIS — R972 Elevated prostate specific antigen [PSA]: Secondary | ICD-10-CM | POA: Diagnosis not present

## 2022-05-26 DIAGNOSIS — R3914 Feeling of incomplete bladder emptying: Secondary | ICD-10-CM | POA: Diagnosis not present

## 2022-06-09 DIAGNOSIS — L821 Other seborrheic keratosis: Secondary | ICD-10-CM | POA: Diagnosis not present

## 2022-06-09 DIAGNOSIS — D1801 Hemangioma of skin and subcutaneous tissue: Secondary | ICD-10-CM | POA: Diagnosis not present

## 2022-06-09 DIAGNOSIS — D225 Melanocytic nevi of trunk: Secondary | ICD-10-CM | POA: Diagnosis not present

## 2022-06-09 DIAGNOSIS — L720 Epidermal cyst: Secondary | ICD-10-CM | POA: Diagnosis not present

## 2022-06-09 DIAGNOSIS — D2371 Other benign neoplasm of skin of right lower limb, including hip: Secondary | ICD-10-CM | POA: Diagnosis not present

## 2022-06-10 DIAGNOSIS — R972 Elevated prostate specific antigen [PSA]: Secondary | ICD-10-CM | POA: Diagnosis not present

## 2022-06-10 DIAGNOSIS — C61 Malignant neoplasm of prostate: Secondary | ICD-10-CM | POA: Diagnosis not present

## 2022-06-10 DIAGNOSIS — N4289 Other specified disorders of prostate: Secondary | ICD-10-CM | POA: Diagnosis not present

## 2022-06-30 DIAGNOSIS — C61 Malignant neoplasm of prostate: Secondary | ICD-10-CM | POA: Diagnosis not present

## 2022-07-09 DIAGNOSIS — C61 Malignant neoplasm of prostate: Secondary | ICD-10-CM | POA: Diagnosis not present

## 2022-08-09 ENCOUNTER — Other Ambulatory Visit: Payer: Self-pay

## 2022-08-09 MED ORDER — DICLOFENAC SODIUM 75 MG PO TBEC: DELAYED_RELEASE_TABLET | ORAL | 1 refills | Status: DC

## 2022-09-01 ENCOUNTER — Ambulatory Visit (INDEPENDENT_AMBULATORY_CARE_PROVIDER_SITE_OTHER): Payer: Medicare Other | Admitting: *Deleted

## 2022-09-01 DIAGNOSIS — Z0000 Encounter for general adult medical examination without abnormal findings: Secondary | ICD-10-CM | POA: Diagnosis not present

## 2022-09-01 NOTE — Patient Instructions (Signed)
[name] , Thank you for taking time to come for your Medicare Wellness Visit. I appreciate your ongoing commitment to your health goals. Please review the following plan we discussed and let me know if I can assist you in the future.   Screening recommendations/referrals: Colonoscopy: no longer required Recommended yearly ophthalmology/optometry visit for glaucoma screening and checkup Recommended yearly dental visit for hygiene and checkup  Vaccinations: Influenza vaccine: Education provided Pneumococcal vaccine: up to date Tdap vaccine: up to date Shingles vaccine: -    Advanced directives: yes     Preventive Care 65 Years and Older, Male Preventive care refers to lifestyle choices and visits with your health care provider that can promote health and wellness. What does preventive care include? A yearly physical exam. This is also called an annual well check. Dental exams once or twice a year. Routine eye exams. Ask your health care provider how often you should have your eyes checked. Personal lifestyle choices, including: Daily care of your teeth and gums. Regular physical activity. Eating a healthy diet. Avoiding tobacco and drug use. Limiting alcohol use. Practicing safe sex. Taking low doses of aspirin every day. Taking vitamin and mineral supplements as recommended by your health care provider. What happens during an annual well check? The services and screenings done by your health care provider during your annual well check will depend on your age, overall health, lifestyle risk factors, and family history of disease. Counseling  Your health care provider may ask you questions about your: Alcohol use. Tobacco use. Drug use. Emotional well-being. Home and relationship well-being. Sexual activity. Eating habits. History of falls. Memory and ability to understand (cognition). Work and work Astronomer. Screening  You may have the following tests or  measurements: Height, weight, and BMI. Blood pressure. Lipid and cholesterol levels. These may be checked every 5 years, or more frequently if you are over 47 years old. Skin check. Lung cancer screening. You may have this screening every year starting at age 61 if you have a 30-pack-year history of smoking and currently smoke or have quit within the past 15 years. Fecal occult blood test (FOBT) of the stool. You may have this test every year starting at age 22. Flexible sigmoidoscopy or colonoscopy. You may have a sigmoidoscopy every 5 years or a colonoscopy every 10 years starting at age 35. Prostate cancer screening. Recommendations will vary depending on your family history and other risks. Hepatitis C blood test. Hepatitis B blood test. Sexually transmitted disease (STD) testing. Diabetes screening. This is done by checking your blood sugar (glucose) after you have not eaten for a while (fasting). You may have this done every 1-3 years. Abdominal aortic aneurysm (AAA) screening. You may need this if you are a current or former smoker. Osteoporosis. You may be screened starting at age 1 if you are at high risk. Talk with your health care provider about your test results, treatment options, and if necessary, the need for more tests. Vaccines  Your health care provider may recommend certain vaccines, such as: Influenza vaccine. This is recommended every year. Tetanus, diphtheria, and acellular pertussis (Tdap, Td) vaccine. You may need a Td booster every 10 years. Zoster vaccine. You may need this after age 98. Pneumococcal 13-valent conjugate (PCV13) vaccine. One dose is recommended after age 8. Pneumococcal polysaccharide (PPSV23) vaccine. One dose is recommended after age 73. Talk to your health care provider about which screenings and vaccines you need and how often you need them. This information is  not intended to replace advice given to you by your health care provider. Make sure  you discuss any questions you have with your health care provider. Document Released: 01/17/2015 Document Revised: 09/10/2015 Document Reviewed: 10/22/2014 Elsevier Interactive Patient Education  2017 [employer].  Fall Prevention in the Home Falls can cause injuries. They can happen to people of all ages. There are many things you can do to make your home safe and to help prevent falls. What can I do on the outside of my home? Regularly fix the edges of walkways and driveways and fix any cracks. Remove anything that might make you trip as you walk through a door, such as a raised step or threshold. Trim any bushes or trees on the path to your home. Use bright outdoor lighting. Clear any walking paths of anything that might make someone trip, such as rocks or tools. Regularly check to see if handrails are loose or broken. Make sure that both sides of any steps have handrails. Any raised decks and porches should have guardrails on the edges. Have any leaves, snow, or ice cleared regularly. Use sand or salt on walking paths during winter. Clean up any spills in your garage right away. This includes oil or grease spills. What can I do in the bathroom? Use night lights. Install grab bars by the toilet and in the tub and shower. Do not use towel bars as grab bars. Use non-skid mats or decals in the tub or shower. If you need to sit down in the shower, use a plastic, non-slip stool. Keep the floor dry. Clean up any water that spills on the floor as soon as it happens. Remove soap buildup in the tub or shower regularly. Attach bath mats securely with double-sided non-slip rug tape. Do not have throw rugs and other things on the floor that can make you trip. What can I do in the bedroom? Use night lights. Make sure that you have a light by your bed that is easy to reach. Do not use any sheets or blankets that are too big for your bed. They should not hang down onto the floor. Have a firm  chair that has side arms. You can use this for support while you get dressed. Do not have throw rugs and other things on the floor that can make you trip. What can I do in the kitchen? Clean up any spills right away. Avoid walking on wet floors. Keep items that you use a lot in easy-to-reach places. If you need to reach something above you, use a strong step stool that has a grab bar. Keep electrical cords out of the way. Do not use floor polish or wax that makes floors slippery. If you must use wax, use non-skid floor wax. Do not have throw rugs and other things on the floor that can make you trip. What can I do with my stairs? Do not leave any items on the stairs. Make sure that there are handrails on both sides of the stairs and use them. Fix handrails that are broken or loose. Make sure that handrails are as long as the stairways. Check any carpeting to make sure that it is firmly attached to the stairs. Fix any carpet that is loose or worn. Avoid having throw rugs at the top or bottom of the stairs. If you do have throw rugs, attach them to the floor with carpet tape. Make sure that you have a light switch at the top of the  stairs and the bottom of the stairs. If you do not have them, ask someone to add them for you. What else can I do to help prevent falls? Wear shoes that: Do not have high heels. Have rubber bottoms. Are comfortable and fit you well. Are closed at the toe. Do not wear sandals. If you use a stepladder: Make sure that it is fully opened. Do not climb a closed stepladder. Make sure that both sides of the stepladder are locked into place. Ask someone to hold it for you, if possible. Clearly mark and make sure that you can see: Any grab bars or handrails. First and last steps. Where the edge of each step is. Use tools that help you move around (mobility aids) if they are needed. These include: Canes. Walkers. Scooters. Crutches. Turn on the lights when you go  into a dark area. Replace any light bulbs as soon as they burn out. Set up your furniture so you have a clear path. Avoid moving your furniture around. If any of your floors are uneven, fix them. If there are any pets around you, be aware of where they are. Review your medicines with your doctor. Some medicines can make you feel dizzy. This can increase your chance of falling. Ask your doctor what other things that you can do to help prevent falls. This information is not intended to replace advice given to you by your health care provider. Make sure you discuss any questions you have with your health care provider. Document Released: 10/17/2008 Document Revised: 05/29/2015 Document Reviewed: 01/25/2014 Elsevier Interactive Patient Education  2017 [employer].

## 2022-09-01 NOTE — Progress Notes (Signed)
Subjective:   [name] is a 77 y.o. male who presents for Medicare Annual/Subsequent preventive examination.  Visit Complete: Virtual  I connected with  [name] on 09/01/22 by a audio enabled telemedicine application and verified that I am speaking with the correct person using two identifiers.  Patient Location: Home  Provider Location: Home Office  I discussed the limitations of evaluation and management by telemedicine. The patient expressed understanding and agreed to proceed.  Patient Medicare AW2 V questionnaire was completed by the patient on 08-31-2022; I have confirmed that all information answered by patient is correct and no changes since this date.  Review of Systems     Cardiac Risk Factors include: advanced age (>62men, >19 women)     Objective:    There were no vitals filed for this visit. There is no height or weight on file to calculate BMI.     09/01/2022    2:12 PM 06/03/2021    2:41 PM 04/14/2020    9:48 AM 04/11/2019    8:36 AM 01/26/2018    9:29 AM 05/13/2017    9:24 AM 01/25/2017    8:22 AM  Advanced Directives  Does Patient Have a Medical Advance Directive? Yes Yes Yes Yes Yes Yes Yes  Type of Estate agent of [employer] Power of [place];Living will Healthcare Power of [place];Living will Living will;Healthcare Power of Attorney Living will;Healthcare Power of Attorney Living will Healthcare Power of [place];Living will  Does patient want to make changes to medical advance directive?    No - Patient declined  No - Patient declined   Copy of Healthcare Power of Attorney in Chart? No - copy requested No - copy requested No - copy requested No - copy requested No - copy requested  No - copy requested    Current Medications (verified) Outpatient Encounter Medications as of 09/01/2022  Medication Sig   alfuzosin (UROXATRAL) 10 MG 24 hr tablet Take 10 mg by mouth daily with breakfast.   aspirin EC 81 MG tablet Take 1  tablet (81 mg total) by mouth daily. Resume 4 days post-op   azelastine (ASTELIN) 0.1 % nasal spray Place 1 spray into both nostrils 2 (two) times daily. Use in each nostril as directed   Blood Glucose Monitoring Suppl (ONE TOUCH ULTRA 2) w/Device KIT Use as directed to test sugars 1-2 times daily. Dx E11.9   cetirizine (ZYRTEC) 10 MG tablet Take 10 mg by mouth daily.   diclofenac (VOLTAREN) 75 MG EC tablet TAKE 1 TABLET(75 MG) BY MOUTH TWICE DAILY WITH A MEAL   docusate sodium (COLACE) 100 MG capsule Take 1 capsule (100 mg total) by mouth 2 (two) times daily as needed for mild constipation.   esomeprazole (NEXIUM) 40 MG capsule Take 1 capsule (40 mg total) by mouth 2 (two) times daily before a meal.   famotidine (PEPCID) 40 MG tablet TAKE 1 TABLET(40 MG) BY MOUTH TWICE DAILY   Fluocinolone Acetonide 0.01 % OIL Place 5 drops in ear(s) 2 (two) times daily.   glimepiride (AMARYL) 2 MG tablet TAKE 1 TABLET BY MOUTH EVERY DAY BEFORE BREAKFAST   glucose blood (ONETOUCH ULTRA) test strip Use as instructed to test sugars 1-2 times daily. Dx. E11.9   losartan (COZAAR) 100 MG tablet TAKE 1 TABLET(100 MG) BY MOUTH DAILY   metFORMIN (GLUCOPHAGE) 500 MG tablet Take one tablet twice daily   multivitamin (THERAGRAN) per tablet Take 1 tablet by mouth daily.   Probiotic Product (DIGESTIVE  ADVANTAGE GUMMIES PO)    RSV vaccine recomb adjuvanted (AREXVY) 120 MCG/0.5ML injection Inject into the muscle.   simvastatin (ZOCOR) 40 MG tablet TAKE 1 TABLET BY MOUTH DAILY IN THE EVENING   No facility-administered encounter medications on file as of 09/01/2022.    Allergies (verified) Ciprofloxacin and Niacin   History: Past Medical History:  Diagnosis Date   Anxiety    BPH (benign prostatic hypertrophy) with urinary obstruction    Cholelithiasis    Clotting disorder (HCC)    Complication of anesthesia    urinary retention ; "bladder didnt wake up"; says last gallbladder surgery he shouldve been cathed with his  med hx   Diabetes mellitus    Type II   Diverticulosis of colon    DJD (degenerative joint disease)    GERD (gastroesophageal reflux disease)    Hemorrhoids    History of colonic polyps    History of DVT (deep vein thrombosis)    History of kidney stones    Hyperlipidemia    Hypertension, benign    Low back pain syndrome    Nephrolithiasis    pulmonary nodule, left lower lobe    Spinal stenosis    Varicose veins of lower extremities    Venous insufficiency    Past Surgical History:  Procedure Laterality Date   APPENDECTOMY     bilateral inguinal hernia repairs     CERVICAL DISC ARTHROPLASTY     CHOLECYSTECTOMY  03/07/2012   CHOLECYSTECTOMY N/A 03/07/2012   Procedure: LAPAROSCOPIC CHOLECYSTECTOMY;  Surgeon: Emelia Loron, MD;  Location: [facility] OR;  Service: General;  Laterality: N/A;   COLONOSCOPY  07/2008   HERNIA REPAIR     ventral hernia with mesh 1990s   JOINT REPLACEMENT     left ankle surgery with plate and screws  11/2007   by Dr. Lajoyce Corners   LITHOTRIPSY     LUMBAR LAMINECTOMY/DECOMPRESSION MICRODISCECTOMY N/A 05/19/2016   Procedure: Microlumbar decompression L3-4, L4-5;  Surgeon: Jene Every, MD;  Location: [facility];  Service: Orthopedics;  Laterality: N/A;   LUMBAR LAMINECTOMY/DECOMPRESSION MICRODISCECTOMY N/A 05/19/2017   Procedure: Microlumbar decompression Lumbar five-Sacral one;  Surgeon: Jene Every, MD;  Location: [facility] OR;  Service: Orthopedics;  Laterality: N/A;   sebaceous cyst surgery     t1-t2 disc surgery  2007   by Dr. Otelia Sergeant   TONSILLECTOMY     TOTAL KNEE ARTHROPLASTY     right and left   Family History  Problem Relation Age of Onset   Gout Father    Coronary artery disease Father 39       died of massive MI age 52   Heart attack Father    Diabetes Mother    CAD Brother 33   Colon cancer Neg Hx    Colon polyps Neg Hx    Esophageal cancer Neg Hx    Rectal cancer Neg Hx    Stomach cancer Neg Hx    Social History   Socioeconomic History   Marital  status: Married    Spouse name: Not on file   Number of children: 2   Years of education: Not on file   Highest education level: Associate degree: occupational, Scientist, product/process development, or vocational program  Occupational History   Occupation: retired  Tobacco Use   Smoking status: Never   Smokeless tobacco: Never  Vaping Use   Vaping status: Never Used  Substance and Sexual Activity   Alcohol use: Not Currently    Comment: social  use   Drug use: No  Sexual activity: Not Currently  Other Topics Concern   Not on file  Social History Narrative   Retired.   Social Determinants of Health   Financial Resource Strain: Low Risk  (09/01/2022)   Overall Financial Resource Strain (CARDIA)    Difficulty of Paying Living Expenses: Not hard at all  Food Insecurity: No Food Insecurity (09/01/2022)   Hunger Vital Sign    Worried About Running Out of Food in the Last Year: Never true    Ran Out of Food in the Last Year: Never true  Transportation Needs: No Transportation Needs (09/01/2022)   PRAPARE - Administrator, Civil Service (Medical): No    Lack of Transportation (Non-Medical): No  Physical Activity: Insufficiently Active (09/01/2022)   Exercise Vital Sign    Days of Exercise per Week: 3 days    Minutes of Exercise per Session: 40 min  Stress: No Stress Concern Present (09/01/2022)   [employer] of Occupational Health - Occupational Stress Questionnaire    Feeling of Stress : Not at all  Social Connections: Unknown (09/01/2022)   Social Connection and Isolation Panel [NHANES]    Frequency of Communication with Friends and Family: Three times a week    Frequency of Social Gatherings with Friends and Family: Once a week    Attends Religious Services: Patient declined    Database administrator or Organizations: No    Attends Engineer, structural: Never    Marital Status: Married    Tobacco Counseling Counseling given: Not Answered   Clinical Intake:  Pre-visit  preparation completed: Yes  Pain : No/denies pain     Diabetes: Yes CBG done?: No Did pt. bring in CBG monitor from home?: No  How often do you need to have someone help you when you read instructions, pamphlets, or other written materials from your doctor or pharmacy?: 1 - Never  Interpreter Needed?: No  Information entered by :: Remi Haggard LPN   Activities of Daily Living    09/01/2022    2:07 PM 08/31/2022    2:35 PM  In your present state of health, do you have any difficulty performing the following activities:  Hearing? 0 0  Vision? 0 0  Difficulty concentrating or making decisions? 0 0  Walking or climbing stairs? 0 0  Dressing or bathing? 0 0  Doing errands, shopping? 0 0  Preparing Food and eating ? N N  Using the Toilet? N N  In the past six months, have you accidently leaked urine? N N  Do you have problems with loss of bowel control? N N  Managing your Medications? N N  Managing your Finances? N N  Housekeeping or managing your Housekeeping? N N    Patient Care Team: Sheliah Hatch, MD as PCP - General (Family Medicine) Durene Romans, MD as Consulting Physician (Orthopedic Surgery) Hilarie Fredrickson, MD as Consulting Physician (Gastroenterology) Elise Benne, MD as Consulting Physician (Ophthalmology) [facility], Dory Peru, MD as Attending Physician Jene Every, MD as Consulting Physician (Orthopedic Surgery) Venancio Poisson, MD as Consulting Physician (Dermatology) [place] (Dentistry)  Indicate any recent Medical Services you may have received from other than [facility] providers in the past year (date may be approximate).     Assessment:   This is a routine wellness examination for [name].  Hearing/Vision screen Hearing Screening - Comments:: No trouble hearing Vision Screening - Comments:: Up to date [place]  Dietary issues and exercise activities discussed:     Goals Addressed  This Visit's Progress    Increase physical  activity         Depression Screen    04/07/2022   10:32 AM 10/02/2021    1:12 PM 06/03/2021    2:42 PM 06/03/2021    2:40 PM 04/07/2021    9:35 AM 10/07/2020    8:39 AM 04/14/2020    9:50 AM  PHQ 2/9 Scores  PHQ - 2 Score 0 0 0 0 0 0 0  PHQ- 9 Score 0 0   0 0     Fall Risk    09/01/2022    2:09 PM 08/31/2022    2:35 PM 04/07/2022   10:32 AM 10/02/2021    1:12 PM 06/03/2021    2:42 PM  Fall Risk   Falls in the past year? 0 0 0 0 0  Number falls in past yr: 0  0  0  Injury with Fall? 0  0  0  Risk for fall due to :   No Fall Risks No Fall Risks No Fall Risks  Risk for fall due to: Comment     cane  Follow up Falls evaluation completed;Education provided;Falls prevention discussed  Falls evaluation completed Falls evaluation completed Falls evaluation completed;Education provided    MEDICARE RISK AT HOME: Medicare Risk at Home Home free of loose throw rugs in walkways, pet beds, electrical cords, etc?: (P) Yes Adequate lighting in your home to reduce risk of falls?: (P) Yes Life alert?: (P) No Use of a cane, walker or w/c?: (P) Yes Grab bars in the bathroom?: (P) Yes Shower chair or bench in shower?: (P) Yes Elevated toilet seat or a handicapped toilet?: (P) Yes  TIMED UP AND GO:  Was the test performed?  No    Cognitive Function:    01/26/2018    9:36 AM 01/25/2017    8:23 AM  MMSE - Mini Mental State Exam  Orientation to time 5 5  Orientation to Place 5 5  Registration 3 3  Attention/ Calculation 3 3  Recall 3 2  Language- name 2 objects 2 2  Language- repeat 1 1  Language- follow 3 step command 3 3  Language- read & follow direction 1 1  Write a sentence 1 1  Copy design 0 1  Total score 27 27        09/01/2022    2:08 PM 04/11/2019    9:21 AM  6CIT Screen  What Year? 0 points 0 points  What month? 0 points 0 points  What time? 0 points 0 points  Count back from 20 0 points 0 points  Months in reverse 0 points 0 points  Repeat phrase 0 points 0 points   Total Score 0 points 0 points    Immunizations Immunization History  Administered Date(s) Administered   COVID-19, mRNA, vaccine(Comirnaty)12 years and older 10/27/2021   Fluad Quad(high Dose 65+) 09/20/2018, 09/24/2019, 09/24/2020   H1N1 01/03/2008   Influenza Split 09/21/2010, 09/14/2011   Influenza Whole 11/05/2008, 09/15/2009   Influenza, High Dose Seasonal PF 10/02/2021   Influenza,inj,Quad PF,6+ Mos 10/05/2012, 10/26/2013, 09/27/2014, 09/23/2015, 09/29/2016, 08/30/2017   Influenza,inj,quad, With Preservative 10/04/2016   PFIZER Comirnaty(Gray Top)Covid-19 Tri-Sucrose Vaccine 04/14/2020   PFIZER(Purple Top)SARS-COV-2 Vaccination 02/09/2019, 03/06/2019, 10/16/2019   Pfizer Covid-19 Vaccine Bivalent Booster 57yrs & up 10/27/2020   Pneumococcal Conjugate-13 12/18/2013   Pneumococcal Polysaccharide-23 12/26/2012   Pneumococcal-Unspecified 01/05/2015   Respiratory Syncytial Virus Vaccine,Recomb Aduvanted(Arexvy) 01/28/2022   Tdap 11/22/2008, 10/27/2021    TDAP status: Up  to date  Flu Vaccine status: Due, Education has been provided regarding the importance of this vaccine. Advised may receive this vaccine at local pharmacy or Health Dept. Aware to provide a copy of the vaccination record if obtained from local pharmacy or Health Dept. Verbalized acceptance and understanding.  Pneumococcal vaccine status: Up to date  Covid-19 vaccine status: Information provided on how to obtain vaccines.   Qualifies for Shingles Vaccine? Yes   Zostavax completed No   Shingrix Completed?: No.    Education has been provided regarding the importance of this vaccine. Patient has been advised to call insurance company to determine out of pocket expense if they have not yet received this vaccine. Advised may also receive vaccine at local pharmacy or Health Dept. Verbalized acceptance and understanding.  Screening Tests Health Maintenance  Topic Date Due   INFLUENZA VACCINE  08/05/2022   HEMOGLOBIN  A1C  10/07/2022   Diabetic kidney evaluation - eGFR measurement  04/07/2023   Diabetic kidney evaluation - Urine ACR  04/07/2023   FOOT EXAM  04/07/2023   OPHTHALMOLOGY EXAM  04/14/2023   Medicare Annual Wellness (AWV)  09/01/2023   DTaP/Tdap/Td (3 - Td or Tdap) 10/28/2031   Pneumonia Vaccine 18+ Years old  Completed   HPV VACCINES  Aged Out   Colonoscopy  Discontinued   COVID-19 Vaccine  Discontinued   Hepatitis C Screening  Discontinued   Zoster Vaccines- Shingrix  Discontinued    Health Maintenance  Health Maintenance Due  Topic Date Due   INFLUENZA VACCINE  08/05/2022    Colorectal cancer screening: No longer required.   Lung Cancer Screening: (Low Dose CT Chest recommended if Age 20-80 years, 20 pack-year currently smoking OR have quit w/in 15years.) does not qualify.   Lung Cancer Screening Referral:   Additional Screening:  Hepatitis C Screening  never done  Vision Screening: Recommended annual ophthalmology exams for early detection of glaucoma and other disorders of the eye. Is the patient up to date with their annual eye exam?  Yes  Who is the provider or what is the name of the office in which the patient attends annual eye exams? [facility] If pt is not established with a provider, would they like to be referred to a provider to establish care? No .   Dental Screening: Recommended annual dental exams for proper oral hygiene    Community Resource Referral / Chronic Care Management: CRR required this visit?  No   CCM required this visit?  No     Plan:     I have personally reviewed and noted the following in the patient's chart:   Medical and social history Use of alcohol, tobacco or illicit drugs  Current medications and supplements including opioid prescriptions. Patient is not currently taking opioid prescriptions. Functional ability and status Nutritional status Physical activity Advanced directives List of other physicians Hospitalizations,  surgeries, and ER visits in previous 12 months Vitals Screenings to include cognitive, depression, and falls Referrals and appointments  In addition, I have reviewed and discussed with patient certain preventive protocols, quality metrics, and best practice recommendations. A written personalized care plan for preventive services as well as general preventive health recommendations were provided to patient.     Remi Haggard, LPN   1/61/0960   After Visit Summary: (MyChart) Due to this being a telephonic visit, the after visit summary with patients personalized plan was offered to patient via MyChart   Nurse Notes:

## 2022-09-04 ENCOUNTER — Other Ambulatory Visit: Payer: Self-pay | Admitting: Family Medicine

## 2022-09-04 DIAGNOSIS — E785 Hyperlipidemia, unspecified: Secondary | ICD-10-CM

## 2022-09-10 ENCOUNTER — Other Ambulatory Visit: Payer: Self-pay

## 2022-09-10 DIAGNOSIS — K219 Gastro-esophageal reflux disease without esophagitis: Secondary | ICD-10-CM

## 2022-09-10 MED ORDER — FAMOTIDINE 40 MG PO TABS
ORAL_TABLET | ORAL | 1 refills | Status: DC
Start: 2022-09-10 — End: 2023-03-08

## 2022-09-24 ENCOUNTER — Other Ambulatory Visit: Payer: Self-pay | Admitting: Family Medicine

## 2022-09-24 DIAGNOSIS — K219 Gastro-esophageal reflux disease without esophagitis: Secondary | ICD-10-CM

## 2022-09-27 DIAGNOSIS — C61 Malignant neoplasm of prostate: Secondary | ICD-10-CM | POA: Diagnosis not present

## 2022-10-04 DIAGNOSIS — C61 Malignant neoplasm of prostate: Secondary | ICD-10-CM | POA: Diagnosis not present

## 2022-10-04 DIAGNOSIS — N401 Enlarged prostate with lower urinary tract symptoms: Secondary | ICD-10-CM | POA: Diagnosis not present

## 2022-10-04 DIAGNOSIS — R3914 Feeling of incomplete bladder emptying: Secondary | ICD-10-CM | POA: Diagnosis not present

## 2022-10-06 ENCOUNTER — Encounter: Payer: Self-pay | Admitting: Family Medicine

## 2022-10-06 ENCOUNTER — Ambulatory Visit: Payer: Medicare Other | Admitting: Family Medicine

## 2022-10-06 VITALS — BP 128/70 | HR 80 | Temp 97.6°F | Wt 231.6 lb

## 2022-10-06 DIAGNOSIS — E119 Type 2 diabetes mellitus without complications: Secondary | ICD-10-CM

## 2022-10-06 DIAGNOSIS — Z7984 Long term (current) use of oral hypoglycemic drugs: Secondary | ICD-10-CM | POA: Diagnosis not present

## 2022-10-06 DIAGNOSIS — Z23 Encounter for immunization: Secondary | ICD-10-CM

## 2022-10-06 DIAGNOSIS — I10 Essential (primary) hypertension: Secondary | ICD-10-CM | POA: Diagnosis not present

## 2022-10-06 DIAGNOSIS — E785 Hyperlipidemia, unspecified: Secondary | ICD-10-CM | POA: Diagnosis not present

## 2022-10-06 LAB — BASIC METABOLIC PANEL
BUN: 26 mg/dL — ABNORMAL HIGH (ref 6–23)
CO2: 24 meq/L (ref 19–32)
Calcium: 9.3 mg/dL (ref 8.4–10.5)
Chloride: 109 meq/L (ref 96–112)
Creatinine, Ser: 1.25 mg/dL (ref 0.40–1.50)
GFR: 55.43 mL/min — ABNORMAL LOW (ref >60.00)
Glucose, Bld: 105 mg/dL — ABNORMAL HIGH (ref 70–99)
Potassium: 4.6 meq/L (ref 3.5–5.1)
Sodium: 140 meq/L (ref 135–145)

## 2022-10-06 LAB — CBC WITH DIFFERENTIAL/PLATELET
Basophils Absolute: 0 10*3/uL (ref 0.0–0.1)
Basophils Relative: 0.4 % (ref 0.0–3.0)
Eosinophils Absolute: 0.3 10*3/uL (ref 0.0–0.7)
Eosinophils Relative: 4.5 % (ref 0.0–5.0)
HCT: 43.1 % (ref 39.0–52.0)
Hemoglobin: 14.2 g/dL (ref 13.0–17.0)
Lymphocytes Relative: 23.7 % (ref 12.0–46.0)
Lymphs Abs: 1.8 10*3/uL (ref 0.7–4.0)
MCHC: 32.8 g/dL (ref 30.0–36.0)
MCV: 93.1 fL (ref 78.0–100.0)
Monocytes Absolute: 0.9 10*3/uL (ref 0.1–1.0)
Monocytes Relative: 11.8 % (ref 3.0–12.0)
Neutro Abs: 4.5 10*3/uL (ref 1.4–7.7)
Neutrophils Relative %: 59.6 % (ref 43.0–77.0)
Platelets: 255 10*3/uL (ref 150.0–400.0)
RBC: 4.63 Mil/uL (ref 4.22–5.81)
RDW: 14.2 % (ref 11.5–15.5)
WBC: 7.6 10*3/uL (ref 4.0–10.5)

## 2022-10-06 LAB — LIPID PANEL
Cholesterol: 103 mg/dL (ref 0–200)
HDL: 41.9 mg/dL (ref >39.00)
LDL Cholesterol: 40 mg/dL (ref 0–99)
NonHDL: 60.94
Total CHOL/HDL Ratio: 2
Triglycerides: 103 mg/dL (ref 0.0–149.0)
VLDL: 20.6 mg/dL (ref 0.0–40.0)

## 2022-10-06 LAB — HEPATIC FUNCTION PANEL
ALT: 29 U/L (ref 0–53)
AST: 20 U/L (ref 0–37)
Albumin: 4.1 g/dL (ref 3.5–5.2)
Alkaline Phosphatase: 45 U/L (ref 39–117)
Bilirubin, Direct: 0.1 mg/dL (ref 0.0–0.3)
Total Bilirubin: 0.5 mg/dL (ref 0.2–1.2)
Total Protein: 6.4 g/dL (ref 6.0–8.3)

## 2022-10-06 LAB — TSH: TSH: 0.64 u[IU]/mL (ref 0.35–5.50)

## 2022-10-06 LAB — HEMOGLOBIN A1C: Hgb A1c MFr Bld: 6 % (ref 4.6–6.5)

## 2022-10-06 NOTE — Assessment & Plan Note (Signed)
Chronic problem.  On Losartan 100mg  daily w/ good control.  Currently asymptomatic.  Check labs but no anticipated med changes.

## 2022-10-06 NOTE — Assessment & Plan Note (Signed)
Chronic problem.  On Glimepiride 2mg  daily and Metformin 500mg  BID w/ hx of good control.  UTD on eye exam, foot exam, microalbumin.  Currently asymptomatic.  Check labs.  Adjust meds prn

## 2022-10-06 NOTE — Patient Instructions (Signed)
Follow up in 6 months to recheck diabetes, BP, and cholesterol We'll notify you of your lab results and make any changes if needed Continue to work on healthy diet and regular exercise- you can do it! Call with any questions or concerns Stay Safe!  Stay Healthy! Happy Fall!!

## 2022-10-06 NOTE — Assessment & Plan Note (Signed)
Chronic problem.  On Simvastatin '40mg'$  daily w/o difficulty.  Check labs.  Adjust meds prn

## 2022-10-06 NOTE — Progress Notes (Signed)
Subjective:    Patient ID: CURLEY FALLO, male    DOB: Aug 23, 1945, 77 y.o.   MRN: [id number]  HPI DM- chronic problem, on Glimepiride 2mg  daily, Metformin 500mg  BID w/ hx of good control.  UTD on microalbumin, foot exam, eye exam.  No numbness/tingling of hands/feet.  No symptomatic lows.  HTN- chronic problem, on Losartan 100mg  daily w/ good control.  No CP, SOB, HA's, visual changes, edema.  Hyperlipidemia- chronic problem, on Simvastatin 40mg  daily.  No abd pain, N/V.  Review of Systems For ROS see HPI     Objective:   Physical Exam Vitals reviewed.  Constitutional:      General: He is not in acute distress.    Appearance: Normal appearance. He is well-developed. He is obese. He is not ill-appearing.  HENT:     Head: Normocephalic and atraumatic.  Eyes:     Extraocular Movements: Extraocular movements intact.     Conjunctiva/sclera: Conjunctivae normal.     Pupils: Pupils are equal, round, and reactive to light.  Neck:     Thyroid: No thyromegaly.  Cardiovascular:     Rate and Rhythm: Normal rate and regular rhythm.     Pulses: Normal pulses.     Heart sounds: Normal heart sounds. No murmur heard. Pulmonary:     Effort: Pulmonary effort is normal. No respiratory distress.     Breath sounds: Normal breath sounds.  Abdominal:     General: Bowel sounds are normal. There is no distension.     Palpations: Abdomen is soft.  Musculoskeletal:     Cervical back: Normal range of motion and neck supple.     Right lower leg: No edema.     Left lower leg: No edema.  Lymphadenopathy:     Cervical: No cervical adenopathy.  Skin:    General: Skin is warm and dry.  Neurological:     General: No focal deficit present.     Mental Status: He is alert and oriented to person, place, and time.     Cranial Nerves: No cranial nerve deficit.  Psychiatric:        Mood and Affect: Mood normal.        Behavior: Behavior normal.           Assessment & Plan:

## 2022-10-07 ENCOUNTER — Telehealth: Payer: Self-pay

## 2022-10-07 NOTE — Telephone Encounter (Signed)
-----   Message from Neena Rhymes sent at 10/07/2022  7:37 AM EDT ----- Labs look good!  No changes at this time

## 2022-10-08 NOTE — Telephone Encounter (Signed)
Pt's wife has been notified.   

## 2022-10-08 NOTE — Telephone Encounter (Signed)
Left vm to call office about labs. 

## 2022-10-12 ENCOUNTER — Other Ambulatory Visit (HOSPITAL_BASED_OUTPATIENT_CLINIC_OR_DEPARTMENT_OTHER): Payer: Self-pay

## 2022-10-12 DIAGNOSIS — Z23 Encounter for immunization: Secondary | ICD-10-CM | POA: Diagnosis not present

## 2022-10-12 MED ORDER — COVID-19 MRNA VAC-TRIS(PFIZER) 30 MCG/0.3ML IM SUSY
0.3000 mL | PREFILLED_SYRINGE | Freq: Once | INTRAMUSCULAR | 0 refills | Status: AC
  Filled 2022-10-12: qty 0.3, 1d supply, fill #0

## 2022-10-25 ENCOUNTER — Other Ambulatory Visit: Payer: Self-pay | Admitting: Family Medicine

## 2022-10-25 DIAGNOSIS — E119 Type 2 diabetes mellitus without complications: Secondary | ICD-10-CM

## 2022-11-04 ENCOUNTER — Other Ambulatory Visit: Payer: Self-pay | Admitting: Family Medicine

## 2022-11-12 DIAGNOSIS — M25552 Pain in left hip: Secondary | ICD-10-CM | POA: Diagnosis not present

## 2022-11-12 DIAGNOSIS — M5451 Vertebrogenic low back pain: Secondary | ICD-10-CM | POA: Diagnosis not present

## 2022-12-17 DIAGNOSIS — M25552 Pain in left hip: Secondary | ICD-10-CM | POA: Diagnosis not present

## 2022-12-21 DIAGNOSIS — M25552 Pain in left hip: Secondary | ICD-10-CM | POA: Diagnosis not present

## 2023-01-04 ENCOUNTER — Other Ambulatory Visit: Payer: Self-pay

## 2023-01-04 MED ORDER — LOSARTAN POTASSIUM 100 MG PO TABS: ORAL_TABLET | ORAL | 1 refills | Status: DC

## 2023-01-28 DIAGNOSIS — M7062 Trochanteric bursitis, left hip: Secondary | ICD-10-CM | POA: Diagnosis not present

## 2023-01-28 DIAGNOSIS — M1612 Unilateral primary osteoarthritis, left hip: Secondary | ICD-10-CM | POA: Diagnosis not present

## 2023-01-28 DIAGNOSIS — M48061 Spinal stenosis, lumbar region without neurogenic claudication: Secondary | ICD-10-CM | POA: Diagnosis not present

## 2023-02-14 DIAGNOSIS — M1612 Unilateral primary osteoarthritis, left hip: Secondary | ICD-10-CM | POA: Insufficient documentation

## 2023-03-08 ENCOUNTER — Other Ambulatory Visit: Payer: Self-pay | Admitting: Family Medicine

## 2023-03-08 DIAGNOSIS — K219 Gastro-esophageal reflux disease without esophagitis: Secondary | ICD-10-CM

## 2023-03-09 DIAGNOSIS — M7062 Trochanteric bursitis, left hip: Secondary | ICD-10-CM | POA: Diagnosis not present

## 2023-03-09 DIAGNOSIS — M25552 Pain in left hip: Secondary | ICD-10-CM | POA: Insufficient documentation

## 2023-03-09 DIAGNOSIS — M25551 Pain in right hip: Secondary | ICD-10-CM | POA: Diagnosis not present

## 2023-03-09 DIAGNOSIS — M48061 Spinal stenosis, lumbar region without neurogenic claudication: Secondary | ICD-10-CM | POA: Diagnosis not present

## 2023-03-14 ENCOUNTER — Other Ambulatory Visit: Payer: Self-pay | Admitting: Family Medicine

## 2023-03-14 DIAGNOSIS — E785 Hyperlipidemia, unspecified: Secondary | ICD-10-CM

## 2023-03-22 ENCOUNTER — Other Ambulatory Visit: Payer: Self-pay | Admitting: Family Medicine

## 2023-03-22 DIAGNOSIS — K219 Gastro-esophageal reflux disease without esophagitis: Secondary | ICD-10-CM

## 2023-03-28 DIAGNOSIS — C61 Malignant neoplasm of prostate: Secondary | ICD-10-CM | POA: Diagnosis not present

## 2023-04-04 ENCOUNTER — Other Ambulatory Visit: Payer: Self-pay | Admitting: Urology

## 2023-04-04 DIAGNOSIS — R351 Nocturia: Secondary | ICD-10-CM

## 2023-04-04 DIAGNOSIS — C61 Malignant neoplasm of prostate: Secondary | ICD-10-CM

## 2023-04-04 HISTORY — DX: Nocturia: R35.1

## 2023-04-05 DIAGNOSIS — M51369 Other intervertebral disc degeneration, lumbar region without mention of lumbar back pain or lower extremity pain: Secondary | ICD-10-CM | POA: Diagnosis not present

## 2023-04-06 ENCOUNTER — Other Ambulatory Visit: Payer: Self-pay | Admitting: Specialist

## 2023-04-06 DIAGNOSIS — M545 Low back pain, unspecified: Secondary | ICD-10-CM

## 2023-04-06 DIAGNOSIS — E119 Type 2 diabetes mellitus without complications: Secondary | ICD-10-CM | POA: Diagnosis not present

## 2023-04-06 DIAGNOSIS — H43813 Vitreous degeneration, bilateral: Secondary | ICD-10-CM | POA: Diagnosis not present

## 2023-04-06 DIAGNOSIS — H524 Presbyopia: Secondary | ICD-10-CM | POA: Diagnosis not present

## 2023-04-06 DIAGNOSIS — Z961 Presence of intraocular lens: Secondary | ICD-10-CM | POA: Diagnosis not present

## 2023-04-06 DIAGNOSIS — H26491 Other secondary cataract, right eye: Secondary | ICD-10-CM | POA: Diagnosis not present

## 2023-04-06 LAB — HM DIABETES EYE EXAM

## 2023-04-07 ENCOUNTER — Encounter: Payer: Self-pay | Admitting: Family Medicine

## 2023-04-07 ENCOUNTER — Ambulatory Visit (INDEPENDENT_AMBULATORY_CARE_PROVIDER_SITE_OTHER): Payer: Medicare Other | Admitting: Family Medicine

## 2023-04-07 VITALS — BP 126/80 | HR 78 | Temp 97.8°F | Ht 70.0 in | Wt 229.0 lb

## 2023-04-07 DIAGNOSIS — I10 Essential (primary) hypertension: Secondary | ICD-10-CM

## 2023-04-07 DIAGNOSIS — E119 Type 2 diabetes mellitus without complications: Secondary | ICD-10-CM

## 2023-04-07 DIAGNOSIS — E785 Hyperlipidemia, unspecified: Secondary | ICD-10-CM | POA: Diagnosis not present

## 2023-04-07 DIAGNOSIS — Z7984 Long term (current) use of oral hypoglycemic drugs: Secondary | ICD-10-CM

## 2023-04-07 LAB — HEPATIC FUNCTION PANEL
ALT: 26 U/L (ref 0–53)
AST: 19 U/L (ref 0–37)
Albumin: 4 g/dL (ref 3.5–5.2)
Alkaline Phosphatase: 37 U/L — ABNORMAL LOW (ref 39–117)
Bilirubin, Direct: 0.1 mg/dL (ref 0.0–0.3)
Total Bilirubin: 0.5 mg/dL (ref 0.2–1.2)
Total Protein: 6.4 g/dL (ref 6.0–8.3)

## 2023-04-07 LAB — CBC WITH DIFFERENTIAL/PLATELET
Basophils Absolute: 0 10*3/uL (ref 0.0–0.1)
Basophils Relative: 0.1 % (ref 0.0–3.0)
Eosinophils Absolute: 0.2 10*3/uL (ref 0.0–0.7)
Eosinophils Relative: 3.1 % (ref 0.0–5.0)
HCT: 42.7 % (ref 39.0–52.0)
Hemoglobin: 14 g/dL (ref 13.0–17.0)
Lymphocytes Relative: 26.8 % (ref 12.0–46.0)
Lymphs Abs: 1.7 10*3/uL (ref 0.7–4.0)
MCHC: 32.9 g/dL (ref 30.0–36.0)
MCV: 92.8 fl (ref 78.0–100.0)
Monocytes Absolute: 0.6 10*3/uL (ref 0.1–1.0)
Monocytes Relative: 10.2 % (ref 3.0–12.0)
Neutro Abs: 3.8 10*3/uL (ref 1.4–7.7)
Neutrophils Relative %: 59.8 % (ref 43.0–77.0)
Platelets: 233 10*3/uL (ref 150.0–400.0)
RBC: 4.6 Mil/uL (ref 4.22–5.81)
RDW: 13.9 % (ref 11.5–15.5)
WBC: 6.4 10*3/uL (ref 4.0–10.5)

## 2023-04-07 LAB — BASIC METABOLIC PANEL WITH GFR
BUN: 25 mg/dL — ABNORMAL HIGH (ref 6–23)
CO2: 26 meq/L (ref 19–32)
Calcium: 9.3 mg/dL (ref 8.4–10.5)
Chloride: 107 meq/L (ref 96–112)
Creatinine, Ser: 1.18 mg/dL (ref 0.40–1.50)
GFR: 59.19 mL/min — ABNORMAL LOW (ref >60.00)
Glucose, Bld: 106 mg/dL — ABNORMAL HIGH (ref 70–99)
Potassium: 4.6 meq/L (ref 3.5–5.1)
Sodium: 140 meq/L (ref 135–145)

## 2023-04-07 LAB — LIPID PANEL
Cholesterol: 109 mg/dL (ref 0–200)
HDL: 42.2 mg/dL (ref >39.00)
LDL Cholesterol: 43 mg/dL (ref 0–99)
NonHDL: 66.74
Total CHOL/HDL Ratio: 3
Triglycerides: 121 mg/dL (ref 0.0–149.0)
VLDL: 24.2 mg/dL (ref 0.0–40.0)

## 2023-04-07 LAB — MICROALBUMIN / CREATININE URINE RATIO
Creatinine,U: 110.9 mg/dL
Microalb Creat Ratio: 25.4 mg/g (ref 0.0–30.0)
Microalb, Ur: 2.8 mg/dL — ABNORMAL HIGH (ref 0.0–1.9)

## 2023-04-07 LAB — TSH: TSH: 0.63 u[IU]/mL (ref 0.35–5.50)

## 2023-04-07 LAB — HEMOGLOBIN A1C: Hgb A1c MFr Bld: 6.4 % (ref 4.6–6.5)

## 2023-04-07 NOTE — Assessment & Plan Note (Signed)
Chronic problem.  On Simvastatin '40mg'$  daily w/o difficulty.  Check labs.  Adjust meds prn

## 2023-04-07 NOTE — Progress Notes (Signed)
   Subjective:    Patient ID: [name], male    DOB: [birth year], 78 y.o.   MRN: [id number]  HPI DM- chronic problem, on Glimepiride 2mg  daily, Metformin 500mg  BID.  Due for foot exam, microalbumin.  UTD on eye exam.  Denies symptomatic lows, no numbness/tingling of hands/feet.  HTN- chronic problem, on Losartan 100mg  daily.  Denies CP, SOB, HA's, visual changes, edema.  Hyperlipidemia- chronic problem, on Simvastatin 40mg  daily.  Denies abd pain, N/V.   Review of Systems For ROS see HPI     Objective:   Physical Exam Vitals reviewed.  Constitutional:      General: He is not in acute distress.    Appearance: Normal appearance. He is well-developed. He is obese. He is not ill-appearing.  HENT:     Head: Normocephalic and atraumatic.  Eyes:     Extraocular Movements: Extraocular movements intact.     Conjunctiva/sclera: Conjunctivae normal.     Pupils: Pupils are equal, round, and reactive to light.  Neck:     Thyroid: No thyromegaly.  Cardiovascular:     Rate and Rhythm: Normal rate and regular rhythm.     Pulses: Normal pulses.     Heart sounds: Normal heart sounds. No murmur heard. Pulmonary:     Effort: Pulmonary effort is normal. No respiratory distress.     Breath sounds: Normal breath sounds.  Abdominal:     General: Bowel sounds are normal. There is no distension.     Palpations: Abdomen is soft.  Musculoskeletal:     Cervical back: Normal range of motion and neck supple.     Right lower leg: No edema.     Left lower leg: No edema.  Lymphadenopathy:     Cervical: No cervical adenopathy.  Skin:    General: Skin is warm and dry.  Neurological:     General: No focal deficit present.     Mental Status: He is alert and oriented to person, place, and time.     Cranial Nerves: No cranial nerve deficit.  Psychiatric:        Mood and Affect: Mood normal.        Behavior: Behavior normal.           Assessment & Plan:

## 2023-04-07 NOTE — Patient Instructions (Addendum)
 Follow up in 6 months to recheck BP, sugar, and cholesterol We'll notify you of your lab results and make any changes if needed Continue to work on healthy diet and get regular physical activity as you are able Call with any questions or concerns Stay Safe!  Stay Healthy! Hang in there!!!  You've got this!!!

## 2023-04-07 NOTE — Assessment & Plan Note (Signed)
 Chronic problem.  On Losartan 100mg  daily w/ good control.  Currently asymptomatic.  Check labs due to ARB use but no anticipated med changes.

## 2023-04-07 NOTE — Assessment & Plan Note (Signed)
 Ongoing issue for pt.  Currently on Glimepiride 2mg  daily and Metformin 500mg  BID.  Foot exam done today.  Microalbumin ordered.  UTD on eye exam.  Check labs.  Adjust meds prn

## 2023-04-08 ENCOUNTER — Encounter: Payer: Self-pay | Admitting: Family Medicine

## 2023-04-12 DIAGNOSIS — H26491 Other secondary cataract, right eye: Secondary | ICD-10-CM | POA: Diagnosis not present

## 2023-04-22 ENCOUNTER — Ambulatory Visit
Admission: RE | Admit: 2023-04-22 | Discharge: 2023-04-22 | Disposition: A | Discharge status: Home / Self Care | Source: Ambulatory Visit | Attending: Urology | Admitting: Urology

## 2023-04-22 ENCOUNTER — Ambulatory Visit
Admission: RE | Admit: 2023-04-22 | Discharge: 2023-04-22 | Disposition: A | Discharge status: Home / Self Care | Source: Ambulatory Visit | Attending: Specialist | Admitting: Specialist

## 2023-04-22 DIAGNOSIS — M4316 Spondylolisthesis, lumbar region: Secondary | ICD-10-CM | POA: Diagnosis not present

## 2023-04-22 DIAGNOSIS — R972 Elevated prostate specific antigen [PSA]: Secondary | ICD-10-CM | POA: Diagnosis not present

## 2023-04-22 DIAGNOSIS — M47816 Spondylosis without myelopathy or radiculopathy, lumbar region: Secondary | ICD-10-CM | POA: Diagnosis not present

## 2023-04-22 DIAGNOSIS — K5730 Diverticulosis of large intestine without perforation or abscess without bleeding: Secondary | ICD-10-CM | POA: Diagnosis not present

## 2023-04-22 DIAGNOSIS — M5126 Other intervertebral disc displacement, lumbar region: Secondary | ICD-10-CM | POA: Diagnosis not present

## 2023-04-22 DIAGNOSIS — C61 Malignant neoplasm of prostate: Secondary | ICD-10-CM

## 2023-04-22 DIAGNOSIS — N400 Enlarged prostate without lower urinary tract symptoms: Secondary | ICD-10-CM | POA: Diagnosis not present

## 2023-04-22 DIAGNOSIS — M545 Low back pain, unspecified: Secondary | ICD-10-CM

## 2023-04-22 MED ORDER — GADOPICLENOL 0.5 MMOL/ML IV SOLN
10.0000 mL | Freq: Once | INTRAVENOUS | Status: AC | PRN
  Administered 2023-04-22: 10 mL via INTRAVENOUS

## 2023-04-28 DIAGNOSIS — M545 Low back pain, unspecified: Secondary | ICD-10-CM | POA: Diagnosis not present

## 2023-04-28 DIAGNOSIS — M5416 Radiculopathy, lumbar region: Secondary | ICD-10-CM | POA: Diagnosis not present

## 2023-04-28 DIAGNOSIS — M51369 Other intervertebral disc degeneration, lumbar region without mention of lumbar back pain or lower extremity pain: Secondary | ICD-10-CM | POA: Diagnosis not present

## 2023-05-01 ENCOUNTER — Other Ambulatory Visit: Payer: Self-pay | Admitting: Family Medicine

## 2023-05-03 DIAGNOSIS — M5416 Radiculopathy, lumbar region: Secondary | ICD-10-CM | POA: Diagnosis not present

## 2023-05-04 ENCOUNTER — Other Ambulatory Visit: Payer: Self-pay | Admitting: Family Medicine

## 2023-05-04 ENCOUNTER — Other Ambulatory Visit: Payer: Self-pay

## 2023-05-04 MED ORDER — METFORMIN HCL 500 MG PO TABS: ORAL_TABLET | ORAL | 1 refills | Status: DC

## 2023-05-04 NOTE — Telephone Encounter (Signed)
 Copied from CRM [id number]. Topic: Clinical - Medication Refill >> May 04, 2023 10:40 AM Palma Bob wrote: Most Recent Primary Care Visit:  Provider: TABORI, KATHERINE E  Department: [facility]  Visit Type: OFFICE VISIT  Date: 04/07/2023  Medication: metFORMIN  (GLUCOPHAGE ) 500 MG tablet  Has the patient contacted their pharmacy? Yes (Agent: If no, request that the patient contact the pharmacy for the refill. If patient does not wish to contact the pharmacy document the reason why and proceed with request.) (Agent: If yes, when and what did the pharmacy advise?)  Is this the correct pharmacy for this prescription? Yes If no, delete pharmacy and type the correct one.  This is the patient's preferred pharmacy:  [id number] - [place], [place] - [address] AT [address] [place] [address] Phone: [phone] Fax: [phone]    Has the prescription been filled recently? No  Is the patient out of the medication? Yes  Has the patient been seen for an appointment in the last year OR does the patient have an upcoming appointment? Yes  Can we respond through MyChart? Yes  Agent: Please be advised that Rx refills may take up to 3 business days. We ask that you follow-up with your pharmacy.

## 2023-05-23 DIAGNOSIS — C61 Malignant neoplasm of prostate: Secondary | ICD-10-CM | POA: Diagnosis not present

## 2023-05-25 DIAGNOSIS — M5416 Radiculopathy, lumbar region: Secondary | ICD-10-CM | POA: Diagnosis not present

## 2023-05-31 ENCOUNTER — Other Ambulatory Visit (HOSPITAL_COMMUNITY): Payer: Self-pay | Admitting: Urology

## 2023-05-31 DIAGNOSIS — C61 Malignant neoplasm of prostate: Secondary | ICD-10-CM

## 2023-06-02 DIAGNOSIS — C61 Malignant neoplasm of prostate: Secondary | ICD-10-CM | POA: Diagnosis not present

## 2023-06-07 ENCOUNTER — Encounter: Payer: Self-pay | Admitting: Radiation Oncology

## 2023-06-07 DIAGNOSIS — M5416 Radiculopathy, lumbar region: Secondary | ICD-10-CM | POA: Diagnosis not present

## 2023-06-07 NOTE — Progress Notes (Signed)
 GU Location of Tumor / Histology: Prostate Ca  If Prostate Cancer, Gleason Score is (4 + 4) and PSA is (19.70 on 03/29/2023)  [name] presented as referral from Dr.  [name] [facility]) elevated PSA.  Biopsies      06/09/2023 Dr. [name] NM PET (PSMA) Skull to Mid Thigh CLINICAL DATA:  Prostate carcinoma with biochemical recurrence.   IMPRESSION: 1. Intense radiotracer activity within the prostate gland consistent with primary prostate adenocarcinoma. 2. No evidence of metastatic adenopathy in the pelvis or periaortic retroperitoneum. 3. No evidence of visceral metastasis or skeletal metastasis. 4. Benign RIGHT adrenal adenoma. 5. Nonobstructing renal calculi. 6. Bladder stones.  04/22/2023 Dr.[name] MR Lumbar spine without Contrast CLINICAL DATA:  Provided history: Lumbar pain. Additional history provided by the scanning technologist: Low back pain radiating down left side with left leg weakness and numbness in left foot. History of spine surgery. History of prostate cancer.  IMPRESSION: 1. Lumbar spondylosis and postoperative changes as outlined within the body of the report. 2. At L2-L3, there is multifactorial moderate central canal stenosis. Left subarticular narrowing with potential to affect the descending left L3 nerve root. Mild left neural foraminal narrowing. 3. At L3-L4, there is multifactorial moderate central canal stenosis with bilateral subarticular narrowing. Moderate-to-severe bilateral neural foraminal narrowing. 4. At L4-L5, post laminotomy changes on the right. Multifactorial bilateral subarticular narrowing with medialization of the descending L5 nerve roots. Mild relative narrowing of the central canal. Severe left neural foraminal narrowing. 5. At L5-S1, post laminotomy changes. Multifactorial mild relative bilateral subarticular narrowing. A disc bulge also mildly effaces the ventral thecal sac at  midline. Bilateral neural foraminal narrowing (moderate-to-severe right, severe left). 6. Disc degeneration is greatest at L3-L4 and L4-L5 (moderate-to-advanced at these levels). 7. Mild marrow edema within the posterior elements on the left at L5, likely degenerative and related to facet arthropathy. 8. Grade 1 spondylolisthesis at L1-L2, L2-L3, L3-L4, L4-L5 and L5-S1. 9. Levocurvature of the lumbar spine.   04/22/2023 Dr. [name] MR Prostate with/without Contrast CLINICAL DATA:  Elevated PSA level. R97.20   IMPRESSION: 1. PI-RADS category 4 lesion of the right posteromedial peripheral zone at the apex. Targeting data sent to [facility]. 2. Prostatomegaly and benign prostatic hypertrophy. 3. Sigmoid colon diverticulosis. 4. Lower lumbar degenerative facet arthropathy.  Past/Anticipated interventions by urology, if any:       Past/Anticipated interventions by medical oncology, if any:  NA  Weight changes, if any: {:18581}  IPSS: SHIM:  Bowel/Bladder complaints, if any: {:18581}   Nausea/Vomiting, if any: {:18581}  Pain issues, if any:  {:18581}  SAFETY ISSUES: Prior radiation? {:18581} Pacemaker/ICD? {:18581} Possible current pregnancy? Male  Is the patient on methotrexate? No  Current Complaints / other details:

## 2023-06-09 ENCOUNTER — Encounter (HOSPITAL_COMMUNITY)
Admission: RE | Admit: 2023-06-09 | Discharge: 2023-06-09 | Disposition: A | Discharge status: Home / Self Care | Source: Ambulatory Visit | Attending: Urology | Admitting: Urology

## 2023-06-09 DIAGNOSIS — C61 Malignant neoplasm of prostate: Secondary | ICD-10-CM | POA: Diagnosis not present

## 2023-06-09 MED ORDER — FLOTUFOLASTAT F 18 GALLIUM 296-5846 MBQ/ML IV SOLN
8.6200 | Freq: Once | INTRAVENOUS | Status: AC
  Administered 2023-06-09: 8.62 via INTRAVENOUS

## 2023-06-13 ENCOUNTER — Other Ambulatory Visit: Payer: Self-pay

## 2023-06-13 NOTE — Progress Notes (Signed)
 Radiation Oncology         [phone] ________________________________  Initial Outpatient Consultation  Name: [name] MRN: [id number]  Date: 06/14/2023  DOB: 07/09/1945  NW:GNFAOZ, Elenore Griffon, MD  Andrez Banker, MD   REFERRING PHYSICIAN: Andrez Banker, MD  DIAGNOSIS: 78 y.o. gentleman with Stage T1c adenocarcinoma of the prostate with Gleason score of 4+4, and PSA of 19.7.    ICD-10-CM   1. Malignant neoplasm of prostate (HCC)  C61       HISTORY OF PRESENT ILLNESS: [name] is a 78 y.o. male with a diagnosis of prostate cancer. He has a history of an elevated PSA dating back to at least 2006 and previously underwent prostate biopsy with Dr. Milon Aloe in 10/2004, which was benign. He has been on PSA surveillance since that time, with his PSA fluctuating between 4 and 9 through 2020, under the care of Dr. Isla Mari and Dr. Joie Narrow. His PSA remained in the 9-range through 2023 before rising to 12.2 in 02/2021. It continued to rise to 14.6 in 04/2022, which prompted a surveillance prostate MRI in 05/2022. This MRI showed a PI-RADS 4 lesion on the right, leading to an MRI fusion prostate biopsy on 06/10/22 with Dr. Joie Narrow. He was found to have very minimal Gleason 3+4 prostate cancer at that time (5% involvement of one core with Gleason 3+3 in 5% of a second core). He was placed on active surveillance given the low volume of favorable intermediate risk disease and advanced age at diagnosis.  He established care with Dr. Dulcy Gibney in 2024 and his PSA had decreased to 8.51 in 09/2022 but again increased to 19.7 in 03/2023. This prompted a repeat prostate MRI on 04/22/23, which again showed a PI-RADS 4 lesion in the right posteromedial peripheral zone at the apex. The patient proceeded to repeat MRI fusion biopsy of the prostate on 05/23/23.  The prostate volume measured 125 cc.  Out of 16 core biopsies, 7 were positive.  The maximum Gleason score was 4+4, and this was seen in  three samples from the MRI ROI. Additionally, Gleason 3+4 was seen in the right apex lateral (small focus), right base lateral, and left base, and Gleason 3+3 in the right apex.  He underwent staging PSMA PET scan on 06/09/23 showing no evidence of activity outside of the prostate.   The patient reviewed the biopsy and imaging results with his urologist and he has kindly been referred today for discussion of potential radiation treatment options.   PREVIOUS RADIATION THERAPY: No  PAST MEDICAL HISTORY:  Past Medical History:  Diagnosis Date   Abdominal pain 2016   Acute prostatitis 2014   Anxiety    BPH (benign prostatic hypertrophy) with urinary obstruction    Calculus of distal right ureter 2014   Cholelithiasis    Clotting disorder (HCC)    Complication of anesthesia    urinary retention ; bladder didnt wake up; says last gallbladder surgery he shouldve been cathed with his med hx   Diabetes mellitus    Type II   Diverticulosis of colon    DJD (degenerative joint disease)    Dysuria 2014   Elevated PSA    Flank pain 2014   GERD (gastroesophageal reflux disease)    Gout    Gross hematuria 2014   Hemorrhoids    History of colonic polyps    History of DVT (deep vein thrombosis)    History of kidney stones    Hyperlipidemia  Hypertension, benign    Low back pain syndrome    Nephrolithiasis    Nocturia 04/04/2023   2018   Peyronie's disease 2018   2017, 2014   pulmonary nodule, left lower lobe    Spinal stenosis    Urinary retention 2014   UTI (urinary tract infection) 2017   2016   Varicose veins of lower extremities    Venous insufficiency       PAST SURGICAL HISTORY: Past Surgical History:  Procedure Laterality Date   APPENDECTOMY     bilateral inguinal hernia repairs     CERVICAL DISC ARTHROPLASTY     CHOLECYSTECTOMY  03/07/2012   CHOLECYSTECTOMY N/A 03/07/2012   Procedure: LAPAROSCOPIC CHOLECYSTECTOMY;  Surgeon: Enid Harry, MD;  Location: [facility] OR;   Service: General;  Laterality: N/A;   COLONOSCOPY  07/2008   HERNIA REPAIR     ventral hernia with mesh 1990s   JOINT REPLACEMENT     left ankle surgery with plate and screws  11/2007   by Dr. Julio Ohm   LITHOTRIPSY     LUMBAR LAMINECTOMY/DECOMPRESSION MICRODISCECTOMY N/A 05/19/2016   Procedure: Microlumbar decompression L3-4, L4-5;  Surgeon: Orvan Blanch, MD;  Location: [facility];  Service: Orthopedics;  Laterality: N/A;   LUMBAR LAMINECTOMY/DECOMPRESSION MICRODISCECTOMY N/A 05/19/2017   Procedure: Microlumbar decompression Lumbar five-Sacral one;  Surgeon: Orvan Blanch, MD;  Location: [facility] OR;  Service: Orthopedics;  Laterality: N/A;   PROSTATE BIOPSY     sebaceous cyst surgery     t1-t2 disc surgery  2007   by Dr. Richardo Chandler   TONSILLECTOMY     TOTAL KNEE ARTHROPLASTY     right and left    FAMILY HISTORY:  Family History  Problem Relation Age of Onset   Gout Father    Coronary artery disease Father 95       died of massive MI age 74   Heart attack Father    Diabetes Mother    CAD Brother 71   Colon cancer Neg Hx    Colon polyps Neg Hx    Esophageal cancer Neg Hx    Rectal cancer Neg Hx    Stomach cancer Neg Hx     SOCIAL HISTORY:  Social History   Socioeconomic History   Marital status: Married    Spouse name: Not on file   Number of children: 2   Years of education: Not on file   Highest education level: Associate degree: occupational, Scientist, product/process development, or vocational program  Occupational History   Occupation: retired  Tobacco Use   Smoking status: Never   Smokeless tobacco: Never  Vaping Use   Vaping status: Never Used  Substance and Sexual Activity   Alcohol use: Not Currently    Comment: social  use   Drug use: No   Sexual activity: Not Currently  Other Topics Concern   Not on file  Social History Narrative   Retired.   Social Drivers of Corporate investment banker Strain: Low Risk  (04/03/2023)   Overall Financial Resource Strain (CARDIA)    Difficulty of  Paying Living Expenses: Not hard at all  Food Insecurity: No Food Insecurity (04/03/2023)   Hunger Vital Sign    Worried About Running Out of Food in the Last Year: Never true    Ran Out of Food in the Last Year: Never true  Transportation Needs: No Transportation Needs (04/03/2023)   PRAPARE - Administrator, Civil Service (Medical): No    Lack of Transportation (Non-Medical): No  Physical Activity: Insufficiently Active (04/03/2023)   Exercise Vital Sign    Days of Exercise per Week: 2 days    Minutes of Exercise per Session: 20 min  Stress: No Stress Concern Present (04/03/2023)   [employer] of Occupational Health - Occupational Stress Questionnaire    Feeling of Stress : Not at all  Social Connections: Unknown (04/03/2023)   Social Connection and Isolation Panel [NHANES]    Frequency of Communication with Friends and Family: More than three times a week    Frequency of Social Gatherings with Friends and Family: Patient declined    Attends Religious Services: Patient declined    Database administrator or Organizations: Patient declined    Attends Banker Meetings: Never    Marital Status: Married  Catering manager Violence: Not At Risk (09/01/2022)   Humiliation, Afraid, Rape, and Kick questionnaire    Fear of Current or Ex-Partner: No    Emotionally Abused: No    Physically Abused: No    Sexually Abused: No    ALLERGIES: Ciprofloxacin and Niacin  MEDICATIONS:  Current Outpatient Medications  Medication Sig Dispense Refill   alfuzosin  (UROXATRAL ) 10 MG 24 hr tablet Take 10 mg by mouth daily with breakfast.     aspirin  EC 81 MG tablet Take 1 tablet (81 mg total) by mouth daily. Resume 4 days post-op     azelastine  (ASTELIN ) 0.1 % nasal spray Place 1 spray into both nostrils 2 (two) times daily. Use in each nostril as directed 30 mL 5   Blood Glucose Monitoring Suppl (ONE TOUCH ULTRA 2) w/Device KIT Use as directed to test sugars 1-2 times daily. Dx  E11.9 1 kit 1   cetirizine (ZYRTEC) 10 MG tablet Take 10 mg by mouth daily.     diclofenac  (VOLTAREN ) 75 MG EC tablet TAKE 1 TABLET(75 MG) BY MOUTH TWICE DAILY WITH A MEAL 180 tablet 1   docusate sodium  (COLACE) 100 MG capsule Take 1 capsule (100 mg total) by mouth 2 (two) times daily as needed for mild constipation. 40 capsule 1   esomeprazole  (NEXIUM ) 40 MG capsule TAKE 1 CAPSULE BY MOUTH TWICE A DAY BEFORE MEALS 180 capsule 1   famotidine  (PEPCID ) 40 MG tablet TAKE 1 TABLET(40 MG) BY MOUTH TWICE DAILY 180 tablet 1   Fluocinolone  Acetonide 0.01 % OIL Place 5 drops in ear(s) 2 (two) times daily. 20 mL 0   glimepiride  (AMARYL ) 2 MG tablet TAKE 1 TABLET BY MOUTH EVERY DAY BEFORE BREAKFAST 90 tablet 1   glucose blood (ONETOUCH ULTRA) test strip Use as instructed to test sugars 1-2 times daily. Dx. E11.9 100 each 12   losartan  (COZAAR ) 100 MG tablet TAKE 1 TABLET(100 MG) BY MOUTH DAILY 90 tablet 1   metFORMIN  (GLUCOPHAGE ) 500 MG tablet TAKE 1 TABLET BY MOUTH TWICE DAILY 180 tablet 1   multivitamin (THERAGRAN) per tablet Take 1 tablet by mouth daily.     ORGOVYX 120 MG tablet Take 120 mg by mouth daily.     Probiotic Product (DIGESTIVE ADVANTAGE GUMMIES PO)      RSV vaccine recomb adjuvanted (AREXVY ) 120 MCG/0.5ML injection Inject into the muscle. 1 each 0   simvastatin  (ZOCOR ) 40 MG tablet TAKE 1 TABLET BY MOUTH DAILY IN THE EVENING 90 tablet 1   No current facility-administered medications for this encounter.    REVIEW OF SYSTEMS:  On review of systems, the patient reports that he is doing well overall. He denies any chest pain, shortness of breath, cough,  fevers, chills, night sweats, unintended weight changes. He denies any bowel disturbances, and denies abdominal pain, nausea or vomiting. He denies any new musculoskeletal or joint aches or pains. His IPSS was 15, indicating moderate urinary symptoms despite taking Uroxatral  daily as prescribed. He reports severe erectile dysfunction and is  currently not sexually active. A complete review of systems is obtained and is otherwise negative.    PHYSICAL EXAM:  Wt Readings from Last 3 Encounters:  04/07/23 229 lb (103.9 kg)  10/06/22 231 lb 9.6 oz (105.1 kg)  04/07/22 233 lb 6 oz (105.9 kg)   Temp Readings from Last 3 Encounters:  04/07/23 97.8 F (36.6 C)  10/06/22 97.6 F (36.4 C) (Temporal)  04/07/22 98.9 F (37.2 C) (Temporal)   BP Readings from Last 3 Encounters:  04/07/23 126/80  10/06/22 128/70  04/07/22 122/82   Pulse Readings from Last 3 Encounters:  04/07/23 78  10/06/22 80  04/07/22 78    /10  In general this is a well appearing Caucasian man in no acute distress. He's alert and oriented x4 and appropriate throughout the examination. Cardiopulmonary assessment is negative for acute distress, and he exhibits normal effort.     KPS = 100  100 - Normal; no complaints; no evidence of disease. 90   - Able to carry on normal activity; minor signs or symptoms of disease. 80   - Normal activity with effort; some signs or symptoms of disease. 55   - Cares for self; unable to carry on normal activity or to do active work. 60   - Requires occasional assistance, but is able to care for most of his personal needs. 50   - Requires considerable assistance and frequent medical care. 40   - Disabled; requires special care and assistance. 30   - Severely disabled; hospital admission is indicated although death not imminent. 20   - Very sick; hospital admission necessary; active supportive treatment necessary. 10   - Moribund; fatal processes progressing rapidly. 0     - Dead  Karnofsky DA, Abelmann WH, Craver LS and Burchenal [facility] [phone]) The use of the nitrogen mustards in the palliative treatment of carcinoma: with particular reference to bronchogenic carcinoma Cancer 1 634-56  LABORATORY DATA:  Lab Results  Component Value Date   WBC 6.4 04/07/2023   HGB 14.0 04/07/2023   HCT 42.7 04/07/2023   MCV 92.8  04/07/2023   PLT 233.0 04/07/2023   Lab Results  Component Value Date   NA 140 04/07/2023   K 4.6 04/07/2023   CL 107 04/07/2023   CO2 26 04/07/2023   Lab Results  Component Value Date   ALT 26 04/07/2023   AST 19 04/07/2023   ALKPHOS 37 (L) 04/07/2023   BILITOT 0.5 04/07/2023     RADIOGRAPHY: NM PET (PSMA) SKULL TO MID THIGH Result Date: 06/10/2023 CLINICAL DATA:  Prostate carcinoma with biochemical recurrence. EXAM: NUCLEAR MEDICINE PET SKULL BASE TO THIGH TECHNIQUE: 8.6 mCi Flotufolastat (Posluma ) was injected intravenously. Full-ring PET imaging was performed from the skull base to thigh after the radiotracer. CT data was obtained and used for attenuation correction and anatomic localization. COMPARISON:  None Available. FINDINGS: NECK No radiotracer activity in neck lymph nodes. Incidental CT finding: None. CHEST No radiotracer accumulation within mediastinal or hilar lymph nodes. No suspicious pulmonary nodules on the CT scan. Incidental CT finding: None. ABDOMEN/PELVIS Prostate: Focal radiotracer activity the posterior LEFT and RIGHT lobe of the prostate gland on image 212. Activity fairly intense measuring  up to SUV max equal 11.7. Prostate gland is enlarged. Lymph nodes: No abnormal radiotracer accumulation within pelvic or abdominal nodes. Liver: No evidence of liver metastasis. Incidental CT finding: Benign RIGHT adrenal adenoma measures 2.8 cm. Nonobstructing calculi within LEFT and RIGHT kidney. Several small bladder stones collect dependently within the bladder (image 208) SKELETON Mild radiotracer activity associated with an osteophyte along the LEFT aspect of the L2 vertebral body is favored benign nonspecific activity. No evidence skeletal metastasis. IMPRESSION: 1. Intense radiotracer activity within the prostate gland consistent with primary prostate adenocarcinoma. 2. No evidence of metastatic adenopathy in the pelvis or periaortic retroperitoneum. 3. No evidence of visceral  metastasis or skeletal metastasis. 4. Benign RIGHT adrenal adenoma. 5. Nonobstructing renal calculi. 6. Bladder stones. Electronically Signed   By: Deboraha Fallow M.D.   On: 06/10/2023 10:58      IMPRESSION/PLAN: 1. 78 y.o. gentleman with Stage T1c adenocarcinoma of the prostate with Gleason Score of 4+4, and PSA of 19.7. We discussed the patient's workup and outlined the nature of prostate cancer in this setting. The patient's T stage, Gleason's score, and PSA put him into the high risk group. Accordingly, he is eligible for a variety of potential treatment options including prostatectomy or LT-ADT concurrent with either 8 weeks of external radiation or 5 weeks of external radiation with an upfront brachytherapy boost. We discussed the available radiation techniques, and focused on the details and logistics of delivery. The patient is not an ideal candidate for brachytherapy boost with a prostate volume of 125 cc and is not an ideal surgical candidate based on his advanced age. Therefore, we discussed and outlined the risks, benefits, short and long-term effects associated with daily external beam radiotherapy and compared and contrasted these with prostatectomy. We discussed the role of SpaceOAR gel in reducing the rectal toxicity associated with radiotherapy. We also detailed the role of ADT in the treatment of high risk prostate cancer and outlined the associated side effects that could be expected with this therapy.  He was encouraged to ask questions that were answered to his stated satisfaction.  At the conclusion of our conversation, the patient is interested in moving forward with 8 weeks of daily external beam radiation,concurrent with ADT. He has not started ADT so we will share our discussion with Dr. Dulcy Gibney and make arrangements for a follow up visit in the urology office, first available, to start of ADT now. We will also coordinate for fiducial markers and SpaceOAR gel placement, in August  2025, prior to simulation, to reduce rectal toxicity from radiotherapy. The patient appears to have a good understanding of his disease and our treatment recommendations which are of curative intent and is in agreement with the stated plan.  Therefore, we will move forward with treatment planning accordingly, in anticipation of beginning IMRT approximately 2 months after starting ADT.  We personally spent 70 minutes in this encounter including chart review, reviewing radiological studies, meeting face-to-face with the patient, entering orders and completing documentation.    Arta Bihari, PA-C    Kenith Payer, MD  [facility]  Radiation Oncology Direct Dial: [phone]  Fax: [phone] [email or web address]  Skype  [email or web address]   This document serves as a record of services personally performed by Kenith Payer, MD and Keitha Pata, PA-C. It was created on their behalf by Florance Hun, a trained medical scribe. The creation of this record is based on the scribe's personal observations and the provider's statements to them. This document has been checked  and approved by the attending provider.

## 2023-06-14 ENCOUNTER — Ambulatory Visit
Admission: RE | Admit: 2023-06-14 | Discharge: 2023-06-14 | Disposition: A | Discharge status: Home / Self Care | Payer: PRIVATE HEALTH INSURANCE | Source: Ambulatory Visit | Attending: Radiation Oncology | Admitting: Radiation Oncology

## 2023-06-14 ENCOUNTER — Telehealth: Payer: Self-pay

## 2023-06-14 ENCOUNTER — Encounter: Payer: Self-pay | Admitting: Radiation Oncology

## 2023-06-14 VITALS — BP 157/82 | HR 88 | Temp 97.2°F | Resp 20 | Ht 70.0 in | Wt 230.4 lb

## 2023-06-14 DIAGNOSIS — Z860101 Personal history of adenomatous and serrated colon polyps: Secondary | ICD-10-CM | POA: Diagnosis not present

## 2023-06-14 DIAGNOSIS — Z87440 Personal history of urinary (tract) infections: Secondary | ICD-10-CM | POA: Diagnosis not present

## 2023-06-14 DIAGNOSIS — C61 Malignant neoplasm of prostate: Secondary | ICD-10-CM | POA: Insufficient documentation

## 2023-06-14 DIAGNOSIS — Z86718 Personal history of other venous thrombosis and embolism: Secondary | ICD-10-CM | POA: Diagnosis not present

## 2023-06-14 DIAGNOSIS — K219 Gastro-esophageal reflux disease without esophagitis: Secondary | ICD-10-CM | POA: Diagnosis not present

## 2023-06-14 DIAGNOSIS — I10 Essential (primary) hypertension: Secondary | ICD-10-CM | POA: Diagnosis not present

## 2023-06-14 DIAGNOSIS — N200 Calculus of kidney: Secondary | ICD-10-CM | POA: Insufficient documentation

## 2023-06-14 DIAGNOSIS — Z87442 Personal history of urinary calculi: Secondary | ICD-10-CM | POA: Diagnosis not present

## 2023-06-14 DIAGNOSIS — M1990 Unspecified osteoarthritis, unspecified site: Secondary | ICD-10-CM | POA: Diagnosis not present

## 2023-06-14 DIAGNOSIS — E119 Type 2 diabetes mellitus without complications: Secondary | ICD-10-CM | POA: Insufficient documentation

## 2023-06-14 DIAGNOSIS — Z79899 Other long term (current) drug therapy: Secondary | ICD-10-CM | POA: Diagnosis not present

## 2023-06-14 DIAGNOSIS — Z7982 Long term (current) use of aspirin: Secondary | ICD-10-CM | POA: Diagnosis not present

## 2023-06-14 DIAGNOSIS — N210 Calculus in bladder: Secondary | ICD-10-CM | POA: Insufficient documentation

## 2023-06-14 DIAGNOSIS — Z7984 Long term (current) use of oral hypoglycemic drugs: Secondary | ICD-10-CM | POA: Insufficient documentation

## 2023-06-14 DIAGNOSIS — Z791 Long term (current) use of non-steroidal anti-inflammatories (NSAID): Secondary | ICD-10-CM | POA: Diagnosis not present

## 2023-06-14 DIAGNOSIS — D3501 Benign neoplasm of right adrenal gland: Secondary | ICD-10-CM | POA: Insufficient documentation

## 2023-06-14 DIAGNOSIS — E785 Hyperlipidemia, unspecified: Secondary | ICD-10-CM | POA: Insufficient documentation

## 2023-06-14 HISTORY — DX: Gout, unspecified: M10.9

## 2023-06-14 HISTORY — DX: Elevated prostate specific antigen (PSA): R97.20

## 2023-06-14 MED ORDER — ONETOUCH ULTRA VI STRP: ORAL_STRIP | 12 refills | Status: AC

## 2023-06-14 NOTE — Telephone Encounter (Signed)
 Noted! Thank you

## 2023-06-14 NOTE — Progress Notes (Signed)
 Introduced myself to the patient, and his wife, as the prostate nurse navigator.  No barriers to care identified at this time.  He is here to discuss his radiation treatment options and will proceed with LT-ADT (started on 6/6) and 8 weeks of daily radiation.  I gave him my business card and asked him to call me with questions or concerns.  Verbalized understanding.

## 2023-06-14 NOTE — Telephone Encounter (Signed)
 Patient calling in to let Dr.Tabori know about medication changes. Patient is now taking Orgobyx mg 1 time daily and calcium  600mg  wth vitamin D3 400IU. Patient also requested refill on onetouch test strips due to oncology wanting him to check his Blood Sugars more often. I did send I the refill for the test strips to preferred pharmacy.

## 2023-06-14 NOTE — Telephone Encounter (Signed)
 Copied from CRM [id number]. Topic: Clinical - Medication Question >> Jun 14, 2023  1:52 PM [name] wrote: Reason for CRM: Patient has a question about medication & would like to have a nurse to give a call back. Please call [phone]

## 2023-06-15 ENCOUNTER — Other Ambulatory Visit: Payer: Self-pay | Admitting: Family Medicine

## 2023-06-19 DIAGNOSIS — M5416 Radiculopathy, lumbar region: Secondary | ICD-10-CM | POA: Diagnosis not present

## 2023-06-19 DIAGNOSIS — M961 Postlaminectomy syndrome, not elsewhere classified: Secondary | ICD-10-CM | POA: Diagnosis not present

## 2023-06-19 DIAGNOSIS — M25559 Pain in unspecified hip: Secondary | ICD-10-CM | POA: Insufficient documentation

## 2023-06-19 DIAGNOSIS — M7062 Trochanteric bursitis, left hip: Secondary | ICD-10-CM | POA: Diagnosis not present

## 2023-06-19 DIAGNOSIS — C61 Malignant neoplasm of prostate: Secondary | ICD-10-CM | POA: Diagnosis not present

## 2023-06-21 DIAGNOSIS — Z191 Hormone sensitive malignancy status: Secondary | ICD-10-CM | POA: Diagnosis not present

## 2023-06-21 DIAGNOSIS — C61 Malignant neoplasm of prostate: Secondary | ICD-10-CM | POA: Diagnosis not present

## 2023-07-07 ENCOUNTER — Other Ambulatory Visit: Payer: Self-pay | Admitting: Urology

## 2023-07-07 ENCOUNTER — Telehealth: Payer: Self-pay | Admitting: *Deleted

## 2023-07-07 DIAGNOSIS — C61 Malignant neoplasm of prostate: Secondary | ICD-10-CM

## 2023-07-07 NOTE — Telephone Encounter (Signed)
 RETURNED PATIENT'S PHONE CALL, SPOKE WITH PATIENT. ?

## 2023-07-13 ENCOUNTER — Telehealth: Payer: Self-pay | Admitting: *Deleted

## 2023-07-13 NOTE — Telephone Encounter (Signed)
 Returned patient's phone call, spoke with patient

## 2023-07-20 ENCOUNTER — Other Ambulatory Visit: Payer: Self-pay | Admitting: Family Medicine

## 2023-08-13 ENCOUNTER — Other Ambulatory Visit: Payer: Self-pay

## 2023-08-13 ENCOUNTER — Ambulatory Visit
Admission: EM | Admit: 2023-08-13 | Discharge: 2023-08-13 | Disposition: A | Discharge status: Home / Self Care | Attending: Physician Assistant | Admitting: Physician Assistant

## 2023-08-13 DIAGNOSIS — T63481A Toxic effect of venom of other arthropod, accidental (unintentional), initial encounter: Secondary | ICD-10-CM

## 2023-08-13 MED ORDER — TRIAMCINOLONE ACETONIDE 0.1 % EX CREA
1.0000 | TOPICAL_CREAM | Freq: Two times a day (BID) | CUTANEOUS | 0 refills | Status: DC
Start: 2023-08-13 — End: 2023-09-19

## 2023-08-13 NOTE — ED Triage Notes (Addendum)
 Pt presents with complaints of multiple yellow jacket stings that occurred yesterday at 4 PM. States he was outside. Mainly complaining on itching, burning, and stinging on his left arm. Unable to sleep last night due to the pain. Currently rates overall pain an 8/10. Benadryl gel + Tylenol  tablets taken with no relief in pain. Denies SOB.

## 2023-08-13 NOTE — ED Provider Notes (Signed)
 [facility]    CSN: [id number] Arrival date & time: 08/13/23  1001      History   Chief Complaint Chief Complaint  Patient presents with   Insect Bite    HPI ISSAK GOLEY is a 78 y.o. male with  has a past medical history of Abdominal pain (2016), Acute prostatitis (2014), Anxiety, BPH (benign prostatic hypertrophy) with urinary obstruction, Calculus of distal right ureter (2014), Cholelithiasis, Clotting disorder (HCC), Complication of anesthesia, Diabetes mellitus, Diverticulosis of colon, DJD (degenerative joint disease), Dysuria (2014), Elevated PSA, Flank pain (2014), GERD (gastroesophageal reflux disease), Gout, Gross hematuria (2014), Hemorrhoids, History of colonic polyps, History of DVT (deep vein thrombosis), History of kidney stones, Hyperlipidemia, Hypertension, benign, Low back pain syndrome, Nephrolithiasis, Nocturia (04/04/2023), Peyronie's disease (2018), pulmonary nodule, left lower lobe, Spinal stenosis, Urinary retention (2014), UTI (urinary tract infection) (2017), Varicose veins of lower extremities, and Venous insufficiency.   HPI  Pt is here with his spouse for concerns of multiple insect stings. They think they were stung by yellow jackets in multiple areas He was stung in four spaces on left arm, back of the leg, the neck and stomach He denies SOB, wheezing, nausea, vomiting, GI upset, dizziness  He reports his left arm is a bit swollen compared to the right  He reports the areas are burning and itching   Interventions : Tylenol , benadryl cream, cool compresses He is already taking Zyrtec daily     Past Medical History:  Diagnosis Date   Abdominal pain 2016   Acute prostatitis 2014   Anxiety    BPH (benign prostatic hypertrophy) with urinary obstruction    Calculus of distal right ureter 2014   Cholelithiasis    Clotting disorder (HCC)    Complication of anesthesia    urinary retention ; bladder didnt wake up; says last gallbladder  surgery he shouldve been cathed with his med hx   Diabetes mellitus    Type II   Diverticulosis of colon    DJD (degenerative joint disease)    Dysuria 2014   Elevated PSA    Flank pain 2014   GERD (gastroesophageal reflux disease)    Gout    Gross hematuria 2014   Hemorrhoids    History of colonic polyps    History of DVT (deep vein thrombosis)    History of kidney stones    Hyperlipidemia    Hypertension, benign    Low back pain syndrome    Nephrolithiasis    Nocturia 04/04/2023   2018   Peyronie's disease 2018   2017, 2014   pulmonary nodule, left lower lobe    Spinal stenosis    Urinary retention 2014   UTI (urinary tract infection) 2017   2016   Varicose veins of lower extremities    Venous insufficiency     Patient Active Problem List   Diagnosis Date Noted   Malignant neoplasm of prostate (HCC) 06/14/2023   HNP (herniated nucleus pulposus), lumbar 05/19/2017   Spinal stenosis of lumbar region 05/19/2016   Physical exam 12/18/2013   Abnormal CXR 08/30/2013   AB (asthmatic bronchitis) 08/30/2013   Allergic rhinitis 08/30/2013   RBC microcytosis 12/26/2012   Chest tightness 01/14/2012   Well controlled type 2 diabetes mellitus (HCC) 11/24/2011   Obesity (BMI 30-39.9) 04/07/2010   VARICOSE VEINS, LOWER EXTREMITIES 09/20/2008   VENOUS INSUFFICIENCY 09/20/2008   DEGENERATIVE JOINT DISEASE 09/20/2008   HYPERSOMNIA 05/30/2008   SYNCOPE 05/20/2008   CHOLELITHIASIS 04/16/2007   History  of DVT of lower extremity 03/29/2007   HYPERTENSION, BENIGN 02/22/2007   Hyperlipidemia 01/27/2007   HEMORRHOIDS 01/27/2007   NEPHROLITHIASIS 01/27/2007   BENIGN PROSTATIC HYPERTROPHY, WITH OBSTRUCTION 01/27/2007   LOW BACK PAIN SYNDROME 01/27/2007   G E R D 01/19/2007    Past Surgical History:  Procedure Laterality Date   APPENDECTOMY     bilateral inguinal hernia repairs     CERVICAL DISC ARTHROPLASTY     CHOLECYSTECTOMY  03/07/2012   CHOLECYSTECTOMY N/A 03/07/2012    Procedure: LAPAROSCOPIC CHOLECYSTECTOMY;  Surgeon: Donnice Bury, MD;  Location: [facility] OR;  Service: General;  Laterality: N/A;   COLONOSCOPY  07/2008   HERNIA REPAIR     ventral hernia with mesh 1990s   JOINT REPLACEMENT     left ankle surgery with plate and screws  11/2007   by Dr. Harden   LITHOTRIPSY     LUMBAR LAMINECTOMY/DECOMPRESSION MICRODISCECTOMY N/A 05/19/2016   Procedure: Microlumbar decompression L3-4, L4-5;  Surgeon: Duwayne Purchase, MD;  Location: [facility];  Service: Orthopedics;  Laterality: N/A;   LUMBAR LAMINECTOMY/DECOMPRESSION MICRODISCECTOMY N/A 05/19/2017   Procedure: Microlumbar decompression Lumbar five-Sacral one;  Surgeon: Duwayne Purchase, MD;  Location: [facility] OR;  Service: Orthopedics;  Laterality: N/A;   PROSTATE BIOPSY     sebaceous cyst surgery     t1-t2 disc surgery  2007   by Dr. Lucilla   TONSILLECTOMY     TOTAL KNEE ARTHROPLASTY     right and left       Home Medications    Prior to Admission medications   Medication Sig Start Date End Date Taking? Authorizing Provider  triamcinolone  cream (KENALOG ) 0.1 % Apply 1 Application topically 2 (two) times daily. 08/13/23  Yes Kavan Devan E, PA-C  alfuzosin  (UROXATRAL ) 10 MG 24 hr tablet Take 10 mg by mouth daily with breakfast.    [provider]  aspirin  EC 81 MG tablet Take 1 tablet (81 mg total) by mouth daily. Resume 4 days post-op 05/20/17   Bissell, Jaclyn M, PA-C  azelastine  (ASTELIN ) 0.1 % nasal spray USE 1 SPRAY IN EACH NOSTRIL TWICE DAILY AS DIRECTED 07/20/23   Tabori, Katherine E, MD  Blood Glucose Monitoring Suppl (ONE TOUCH ULTRA 2) w/Device KIT Use as directed to test sugars 1-2 times daily. Dx E11.9 12/08/18   Tabori, Katherine E, MD  calcium  carbonate (OSCAL) 1500 (600 Ca) MG TABS tablet Take 600 mg by mouth daily.    [provider]  cetirizine (ZYRTEC) 10 MG tablet Take 10 mg by mouth daily.    [provider]  diclofenac  (VOLTAREN ) 75 MG EC tablet TAKE 1 TABLET(75 MG) BY  MOUTH TWICE DAILY WITH A MEAL 03/08/23   Tabori, Katherine E, MD  docusate sodium  (COLACE) 100 MG capsule Take 1 capsule (100 mg total) by mouth 2 (two) times daily as needed for mild constipation. 07/30/21   Tabori, Katherine E, MD  esomeprazole  (NEXIUM ) 40 MG capsule TAKE 1 CAPSULE BY MOUTH TWICE A DAY BEFORE MEALS 03/22/23   Tabori, Katherine E, MD  Fluocinolone  Acetonide 0.01 % OIL Place 5 drops in ear(s) 2 (two) times daily. 07/30/21   Tabori, Katherine E, MD  glimepiride  (AMARYL ) 2 MG tablet TAKE 1 TABLET BY MOUTH EVERY DAY BEFORE BREAKFAST 10/25/22   Tabori, Katherine E, MD  glucose blood (ONETOUCH ULTRA) test strip Use as instructed to test sugars 1-2 times daily. Dx. E11.9 06/14/23   Mahlon Comer BRAVO, MD  losartan  (COZAAR ) 100 MG tablet TAKE 1 TABLET(100 MG) BY  MOUTH DAILY 06/15/23   Tabori, Katherine E, MD  metFORMIN  (GLUCOPHAGE ) 500 MG tablet TAKE 1 TABLET BY MOUTH TWICE DAILY 05/04/23   Tabori, Katherine E, MD  multivitamin (THERAGRAN) per tablet Take 1 tablet by mouth daily.    [provider]  ORGOVYX 120 MG tablet Take 120 mg by mouth daily. 06/10/23   [provider]  Probiotic Product (DIGESTIVE ADVANTAGE GUMMIES PO)     [provider]  RSV vaccine recomb adjuvanted (AREXVY ) 120 MCG/0.5ML injection Inject into the muscle. 01/28/22   Luiz Channel, MD  simvastatin  (ZOCOR ) 40 MG tablet TAKE 1 TABLET BY MOUTH DAILY IN THE EVENING 03/15/23   Mahlon Comer BRAVO, MD    Family History Family History  Problem Relation Age of Onset   Gout Father    Coronary artery disease Father 73       died of massive MI age 50   Heart attack Father    Diabetes Mother    CAD Brother 50   Colon cancer Neg Hx    Colon polyps Neg Hx    Esophageal cancer Neg Hx    Rectal cancer Neg Hx    Stomach cancer Neg Hx     Social History Social History   Tobacco Use   Smoking status: Never   Smokeless tobacco: Never  Vaping Use   Vaping status: Never Used  Substance Use Topics    Alcohol use: Not Currently    Comment: social  use   Drug use: No     Allergies   Ciprofloxacin and Niacin   Review of Systems Review of Systems  Eyes:  Negative for visual disturbance.  Respiratory:  Negative for chest tightness, shortness of breath and wheezing.   Gastrointestinal:  Negative for diarrhea, nausea and vomiting.  Skin:        Multiple insect stings    Neurological:  Negative for dizziness and light-headedness.     Physical Exam Triage Vital Signs ED Triage Vitals  Encounter Vitals Group     BP 08/13/23 1019 (!) 146/82     Girls Systolic BP Percentile --      Girls Diastolic BP Percentile --      Boys Systolic BP Percentile --      Boys Diastolic BP Percentile --      Pulse Rate 08/13/23 1019 80     Resp 08/13/23 1019 17     Temp 08/13/23 1019 97.6 F (36.4 C)     Temp Source 08/13/23 1019 Oral     SpO2 08/13/23 1019 97 %     Weight 08/13/23 1019 230 lb (104.3 kg)     Height 08/13/23 1019 5' 10 (1.778 m)     Head Circumference --      Peak Flow --      Pain Score 08/13/23 1039 8     Pain Loc --      Pain Education --      Exclude from Growth Chart --    No data found.  Updated Vital Signs BP (!) 146/82 (BP Location: Left Arm)   Pulse 80   Temp 97.6 F (36.4 C) (Oral)   Resp 17   Ht 5' 10 (1.778 m)   Wt 230 lb (104.3 kg)   SpO2 97%   BMI 33.00 kg/m   Visual Acuity Right Eye Distance:   Left Eye Distance:   Bilateral Distance:    Right Eye Near:   Left Eye Near:    Bilateral Near:  Physical Exam Vitals reviewed.  Constitutional:      General: He is awake. He is not in acute distress.    Appearance: Normal appearance. He is well-developed and well-groomed. He is not ill-appearing or toxic-appearing.  HENT:     Head: Normocephalic and atraumatic.     Mouth/Throat:     Mouth: No angioedema.  Eyes:     Extraocular Movements: Extraocular movements intact.     Conjunctiva/sclera: Conjunctivae normal.  Cardiovascular:      Rate and Rhythm: Normal rate and regular rhythm.     Heart sounds: Normal heart sounds. No murmur heard.    No friction rub. No gallop.  Pulmonary:     Effort: Pulmonary effort is normal.     Breath sounds: Normal breath sounds. No decreased air movement. No decreased breath sounds, wheezing, rhonchi or rales.  Musculoskeletal:     Cervical back: Normal range of motion and neck supple.  Skin:    General: Skin is warm and dry.     Findings: Erythema and lesion present.      Neurological:     Mental Status: He is alert and oriented to person, place, and time.  Psychiatric:        Attention and Perception: Attention and perception normal.        Mood and Affect: Mood and affect normal.        Speech: Speech normal.        Behavior: Behavior normal. Behavior is cooperative.      UC Treatments / Results  Labs (all labs ordered are listed, but only abnormal results are displayed) Labs Reviewed - No data to display  EKG   Radiology No results found.  Procedures Procedures (including critical care time)  Medications Ordered in UC Medications - No data to display  Initial Impression / Assessment and Plan / UC Course  I have reviewed the triage vital signs and the nursing notes.  Pertinent labs & imaging results that were available during my care of the patient were reviewed by me and considered in my medical decision making (see chart for details).      Final Clinical Impressions(s) / UC Diagnoses   Final diagnoses:  Insect stings, accidental or unintentional, initial encounter   Patient is here today with his spouse.  They report concerns for multiple suspected yellowjacket stings that occurred last night while they were outside.  Patient states that his left hand and forearm appears swollen compared to the right and he thinks that he was stung in these areas about 4 times.  Physical exam does show a mild swelling of the left hand along the dorsum but patient has intact  range of motion of fingers and wrist.  He has multiple erythematous papular lesions along the forearm, trunk and left lower extremity consistent with hymenoptera stings.  No evidence of systemic involvement such as angioedema, shortness of breath, wheezing, diffuse rash or GI symptoms.  I reviewed typical local reaction of insect stings with patient and spouse as well as symptoms of large local reaction and recommend continued use of antihistamine, cool compresses to the affected areas, Tylenol  for symptoms.  Will send Kenalog  cream to assist with itching and discomfort.  Reviewed ED and return precautions which were also provided in after visit summary.  Follow-up as needed.    Discharge Instructions      You were seen today for concerns of a sting from a wasp or yellow jacket type insect. The area appears to be  developing a local reaction which is expected after such an injury.  To help with your symptoms I recommend the following: Cool compresses to the area (15 minutes on, at least 30 minutes off) An antihistamine such as Claritin , Allegra, Zyrtec, Xyzal.  You can use Benadryl but please be aware that this can cause sedation so do not use if you need to remain alert or drive Taking Tylenol  or ibuprofen as needed for pain management Please be aware that the affected area may enlarge slightly but should not exceed 4 to 5 inches in diameter.  If this occurs you may develop what is called a large local reaction that can last for 3 to 5 days.  It is still treated in the same way but will need to be monitored to make sure that you are not having an extended reaction. Please monitor the affected area for signs of a skin infection such as severe tenderness, drainage, oozing, increased warmth to the affected skin, severe redness, fever or chills.  If any of these occur please return to urgent care or follow-up with your PCP      ED Prescriptions     Medication Sig Dispense Auth. Provider    triamcinolone  cream (KENALOG ) 0.1 % Apply 1 Application topically 2 (two) times daily. 30 g Thamas Appleyard E, PA-C      PDMP not reviewed this encounter.   Marylene Rocky BRAVO, PA-C 08/13/23 1134

## 2023-08-13 NOTE — Discharge Instructions (Addendum)
 You were seen today for concerns of a sting from a wasp or yellow jacket type insect. The area appears to be developing a local reaction which is expected after such an injury.  To help with your symptoms I recommend the following: Cool compresses to the area (15 minutes on, at least 30 minutes off) An antihistamine such as Claritin, Allegra, Zyrtec, Xyzal.  You can use Benadryl but please be aware that this can cause sedation so do not use if you need to remain alert or drive Taking Tylenol  or ibuprofen  as needed for pain management Please be aware that the affected area may enlarge slightly but should not exceed 4 to 5 inches in diameter.  If this occurs you may develop what is called a large local reaction that can last for 3 to 5 days.  It is still treated in the same way but will need to be monitored to make sure that you are not having an extended reaction. Please monitor the affected area for signs of a skin infection such as severe tenderness, drainage, oozing, increased warmth to the affected skin, severe redness, fever or chills.  If any of these occur please return to urgent care or follow-up with your PCP

## 2023-08-23 ENCOUNTER — Encounter (HOSPITAL_COMMUNITY): Payer: Self-pay | Admitting: Urology

## 2023-08-23 NOTE — Progress Notes (Signed)
 Spoke w/ via phone for pre-op interview--- [name] and wife [name] needs dos----  A1c, BMP and CBG per anesthesia.       Myers results------ COVID test -----patient states asymptomatic no test needed Arrive at -------1030 NPO after MN NO Solid Food.  Clear liquids from MN until---0930 Pre-Surgery Ensure or G2:  Med rec completed Medications to take morning of surgery -----Orgovyx, Nexium  and Alfluzosin. Diabetic medication ----- Hold AM doses  GLP1 agonist last dose: GLP1 instructions:  Patient instructed no nail polish to be worn day of surgery Patient instructed to bring photo id and insurance card day of surgery Patient aware to have Driver (ride ) / caregiver    for 24 hours after surgery - Wife Spencer Fortner Patient Special Instructions -----Fleet enema per surgeons instructions. Pre-Op special Instructions -----  Patient verbalized understanding of instructions that were given at this phone interview. Patient denies chest pain, sob, fever, cough at the interview.

## 2023-08-24 ENCOUNTER — Other Ambulatory Visit (HOSPITAL_COMMUNITY): Payer: PRIVATE HEALTH INSURANCE

## 2023-08-25 ENCOUNTER — Telehealth: Payer: Self-pay | Admitting: *Deleted

## 2023-08-25 NOTE — Telephone Encounter (Signed)
 Returned patient's phone call, spoke with patient

## 2023-08-26 ENCOUNTER — Ambulatory Visit (HOSPITAL_COMMUNITY)
Admission: RE | Admit: 2023-08-26 | Discharge: 2023-08-26 | Disposition: A | Discharge status: Home / Self Care | Payer: PRIVATE HEALTH INSURANCE | Attending: Urology | Admitting: Urology

## 2023-08-26 ENCOUNTER — Telehealth: Payer: Self-pay | Admitting: *Deleted

## 2023-08-26 ENCOUNTER — Ambulatory Visit (HOSPITAL_COMMUNITY): Admitting: Anesthesiology

## 2023-08-26 ENCOUNTER — Encounter (HOSPITAL_COMMUNITY): Payer: Self-pay | Admitting: Urology

## 2023-08-26 ENCOUNTER — Encounter (HOSPITAL_COMMUNITY)
Admission: RE | Disposition: A | Discharge status: Home / Self Care | Payer: Self-pay | Source: Home / Self Care | Attending: Urology

## 2023-08-26 DIAGNOSIS — K219 Gastro-esophageal reflux disease without esophagitis: Secondary | ICD-10-CM | POA: Diagnosis not present

## 2023-08-26 DIAGNOSIS — Z79899 Other long term (current) drug therapy: Secondary | ICD-10-CM | POA: Diagnosis not present

## 2023-08-26 DIAGNOSIS — I10 Essential (primary) hypertension: Secondary | ICD-10-CM | POA: Diagnosis not present

## 2023-08-26 DIAGNOSIS — Z7984 Long term (current) use of oral hypoglycemic drugs: Secondary | ICD-10-CM | POA: Insufficient documentation

## 2023-08-26 DIAGNOSIS — E119 Type 2 diabetes mellitus without complications: Secondary | ICD-10-CM | POA: Insufficient documentation

## 2023-08-26 DIAGNOSIS — Z9049 Acquired absence of other specified parts of digestive tract: Secondary | ICD-10-CM | POA: Insufficient documentation

## 2023-08-26 DIAGNOSIS — C61 Malignant neoplasm of prostate: Secondary | ICD-10-CM

## 2023-08-26 HISTORY — PX: SPACE OAR INSTILLATION: SHX6769

## 2023-08-26 HISTORY — PX: GOLD SEED IMPLANT: SHX6343

## 2023-08-26 LAB — BASIC METABOLIC PANEL WITH GFR
Anion gap: 10 (ref 5–15)
BUN: 22 mg/dL (ref 8–23)
CO2: 19 mmol/L — ABNORMAL LOW (ref 22–32)
Calcium: 9.4 mg/dL (ref 8.9–10.3)
Chloride: 114 mmol/L — ABNORMAL HIGH (ref 98–111)
Creatinine, Ser: 1.03 mg/dL (ref 0.61–1.24)
GFR, Estimated: >60 mL/min (ref >60)
Glucose, Bld: 149 mg/dL — ABNORMAL HIGH (ref 70–99)
Potassium: 4 mmol/L (ref 3.5–5.1)
Sodium: 143 mmol/L (ref 135–145)

## 2023-08-26 LAB — HEMOGLOBIN A1C
Hgb A1c MFr Bld: 5.9 % — ABNORMAL HIGH (ref 4.8–5.6)
Mean Plasma Glucose: 122.63 mg/dL

## 2023-08-26 LAB — GLUCOSE, CAPILLARY
Glucose-Capillary: 133 mg/dL — ABNORMAL HIGH (ref 70–99)
Glucose-Capillary: 123 mg/dL — ABNORMAL HIGH (ref 70–99)

## 2023-08-26 SURGERY — INSERTION, GOLD SEEDS
Anesthesia: Monitor Anesthesia Care

## 2023-08-26 MED ORDER — SODIUM CHLORIDE (PF) 0.9 % IJ SOLN
INTRAMUSCULAR | Status: DC | PRN
  Administered 2023-08-26: 10 mL

## 2023-08-26 MED ORDER — LIDOCAINE 2% (20 MG/ML) 5 ML SYRINGE
INTRAMUSCULAR | Status: DC | PRN
  Administered 2023-08-26: 100 mg via INTRAVENOUS

## 2023-08-26 MED ORDER — PROPOFOL 10 MG/ML IV BOLUS
INTRAVENOUS | Status: DC | PRN
Start: 2023-08-26 — End: 2023-08-26
  Administered 2023-08-26: 50 mg via INTRAVENOUS
  Administered 2023-08-26: 100 mg via INTRAVENOUS

## 2023-08-26 MED ORDER — MIDAZOLAM HCL 2 MG/2ML IJ SOLN: 0.5000 mg | Freq: Once | INTRAMUSCULAR | Status: DC | PRN

## 2023-08-26 MED ORDER — CHLORHEXIDINE GLUCONATE 0.12 % MT SOLN
15.0000 mL | Freq: Once | OROMUCOSAL | Status: AC
  Administered 2023-08-26: 15 mL via OROMUCOSAL

## 2023-08-26 MED ORDER — FLEET ENEMA RE ENEM: 1.0000 | ENEMA | Freq: Once | RECTAL | Status: DC

## 2023-08-26 MED ORDER — CEFAZOLIN SODIUM-DEXTROSE 2-4 GM/100ML-% IV SOLN
2.0000 g | INTRAVENOUS | Status: AC
  Administered 2023-08-26: 2 g via INTRAVENOUS

## 2023-08-26 MED ORDER — OXYCODONE HCL 5 MG PO TABS: 5.0000 mg | ORAL_TABLET | Freq: Once | ORAL | Status: DC | PRN

## 2023-08-26 MED ORDER — CEFAZOLIN SODIUM-DEXTROSE 2-4 GM/100ML-% IV SOLN
INTRAVENOUS | Status: AC
  Filled 2023-08-26: qty 100

## 2023-08-26 MED ORDER — DEXAMETHASONE SODIUM PHOSPHATE 10 MG/ML IJ SOLN
INTRAMUSCULAR | Status: AC
  Filled 2023-08-26: qty 1

## 2023-08-26 MED ORDER — FENTANYL CITRATE (PF) 100 MCG/2ML IJ SOLN: 25.0000 ug | INTRAMUSCULAR | Status: DC | PRN

## 2023-08-26 MED ORDER — INSULIN ASPART 100 UNIT/ML IJ SOLN: 0.0000 [IU] | INTRAMUSCULAR | Status: DC | PRN

## 2023-08-26 MED ORDER — ONDANSETRON HCL 4 MG/2ML IJ SOLN
INTRAMUSCULAR | Status: AC
  Filled 2023-08-26: qty 2

## 2023-08-26 MED ORDER — MEPERIDINE HCL 25 MG/ML IJ SOLN: 6.2500 mg | INTRAMUSCULAR | Status: DC | PRN

## 2023-08-26 MED ORDER — LIDOCAINE HCL 1 % IJ SOLN
INTRAMUSCULAR | Status: DC | PRN
Start: 2023-08-26 — End: 2023-08-26
  Administered 2023-08-26: 10 mL

## 2023-08-26 MED ORDER — DEXAMETHASONE SODIUM PHOSPHATE 10 MG/ML IJ SOLN
INTRAMUSCULAR | Status: DC | PRN
Start: 2023-08-26 — End: 2023-08-26
  Administered 2023-08-26: 10 mg via INTRAVENOUS

## 2023-08-26 MED ORDER — LACTATED RINGERS IV SOLN: INTRAVENOUS | Status: DC

## 2023-08-26 MED ORDER — CHLORHEXIDINE GLUCONATE 0.12 % MT SOLN
OROMUCOSAL | Status: AC
  Filled 2023-08-26: qty 15

## 2023-08-26 MED ORDER — ORAL CARE MOUTH RINSE: 15.0000 mL | Freq: Once | OROMUCOSAL | Status: AC

## 2023-08-26 MED ORDER — OXYCODONE HCL 5 MG/5ML PO SOLN: 5.0000 mg | Freq: Once | ORAL | Status: DC | PRN

## 2023-08-26 MED ORDER — ONDANSETRON HCL 4 MG/2ML IJ SOLN
INTRAMUSCULAR | Status: DC | PRN
  Administered 2023-08-26: 4 mg via INTRAVENOUS

## 2023-08-26 MED ORDER — LIDOCAINE HCL 1 % IJ SOLN
INTRAMUSCULAR | Status: AC
  Filled 2023-08-26: qty 20

## 2023-08-26 SURGICAL SUPPLY — 20 items
BLADE CLIPPER SENSICLIP SURGIC (BLADE) ×2 IMPLANT
CNTNR URN SCR LID CUP LEK RST (MISCELLANEOUS) ×2 IMPLANT
COVER BACK TABLE 60X90IN (DRAPES) ×2 IMPLANT
DRSG TEGADERM 4X4.75 (GAUZE/BANDAGES/DRESSINGS) ×2 IMPLANT
DRSG TEGADERM 8X12 (GAUZE/BANDAGES/DRESSINGS) ×2 IMPLANT
GAUZE SPONGE 4X4 12PLY STRL (GAUZE/BANDAGES/DRESSINGS) ×2 IMPLANT
GLOVE BIO SURGEON STRL SZ7.5 (GLOVE) ×2 IMPLANT
IMPL SPACEOAR SYSTEM 10ML (Spacer) ×2 IMPLANT
MARKER GOLD PRELOAD 1.2X3 (Urological Implant) ×2 IMPLANT
MARKER SKIN DUAL TIP RULER LAB (MISCELLANEOUS) ×2 IMPLANT
NDL SPNL 22GX3.5 QUINCKE BK (NEEDLE) ×2 IMPLANT
NEEDLE SPNL 22GX3.5 QUINCKE BK (NEEDLE) ×1 IMPLANT
SHEATH ULTRASOUND LF (SHEATH) IMPLANT
SHEATH ULTRASOUND LTX NONSTRL (SHEATH) IMPLANT
SLEEVE SCD COMPRESS KNEE MED (STOCKING) ×2 IMPLANT
SURGILUBE 2OZ TUBE FLIPTOP (MISCELLANEOUS) ×2 IMPLANT
SYR 10ML LL (SYRINGE) ×2 IMPLANT
SYR CONTROL 10ML LL (SYRINGE) ×2 IMPLANT
TOWEL OR 17X24 6PK STRL BLUE (TOWEL DISPOSABLE) ×2 IMPLANT
UNDERPAD 30X36 HEAVY ABSORB (UNDERPADS AND DIAPERS) ×2 IMPLANT

## 2023-08-26 NOTE — Telephone Encounter (Signed)
 Spoke with patient and reminded him of sim and MRI appt. for 08-29-23, informed patient to arrive with a full bladder, patient verified understanding this, patient verified understanding these appts.

## 2023-08-26 NOTE — Anesthesia Postprocedure Evaluation (Signed)
 Anesthesia Post Note  Patient: [name]  Procedure(s) Performed: INSERTION, GOLD SEEDS INJECTION, HYDROGEL SPACER     Patient location during evaluation: PACU Anesthesia Type: MAC Level of consciousness: awake and alert, oriented and patient cooperative Pain management: pain level controlled Vital Signs Assessment: post-procedure vital signs reviewed and stable Respiratory status: spontaneous breathing, nonlabored ventilation and respiratory function stable Cardiovascular status: blood pressure returned to baseline and stable Postop Assessment: no apparent nausea or vomiting and able to ambulate Anesthetic complications: no   No notable events documented.  Last Vitals:  Vitals:   08/26/23 1330 08/26/23 1345  BP: 120/80   Pulse: 69 67  Resp: 20 17  Temp:    SpO2: 96% 93%    Last Pain:  Vitals:   08/26/23 1330  TempSrc:   PainSc: 0-No pain                 Naylea Wigington,E. Vivaan Helseth

## 2023-08-26 NOTE — H&P (Signed)
 CC: Prostate cancer   He has a long h/o elevated PSA w/ 1st TRUS/Bx in 2006. Volume 64 mL, path benign.   5.16.2024: MRI prostate: Volume 152 mL. PSA 14.6. PSAD 0.10  No extraprostatic abnormalities.  Solitary ROI--PI RADS 4 lesion of Rt posteromedial peropheral zone @ apex.   6.6.2024: Fusion Bx. Prostate volume 145 mL.  All 4 cores from ROI negative. 2/12 systematic cores positive for adenocarcinoma: Lt mid lateral--5% of core w/ GS 3+4 pattern, Rt apex lateral 5% of core w/ GS 3+3 pattern  Polaris genetic results, 6/24: Molecular score 3.5 which is slightly over the threshold for active surveillance and gives him a 3.2% risk of single modal metastasis after treatment within the next 10 years.   Prostate MRI, 4/25: PI-RADS 4 lesion in the right posterior medial peripheral zone  MRI fusion guided prostate biopsy, 05/23/2023:  ROI: 4+4 = 8 and 3/4 cores, highest volume was 20%  3+4 = 7 (10%) at the left base, 3+4 = 7 (10%) the right base, 3+4 = 7 (5%) at the right apex, 3+3 = 6 (10%) right medial apex   IPSS 13 QoL 2-takes Uroxatrol 10 mg daily   History of postop urinary retention status post cholecystectomy in 2014. Urodynamics demonstrated large capacity bladder with a weak detrusor contraction. He was treated with bethanechol and intermittent cath. He is now voiding on his own.   Patient is a non-insulin -dependent diabetic and obese.   Interval: The patient is here today to discuss his prostate biopsy, which was performed 2 weeks prior. He now has grade group 4 prostate cancer.     ALLERGIES: Cipro TABS Niaspan TBCR    MEDICATIONS: Valium 10 MG Tablet 2 tablet PO once take one hour prior to procedure  Aspirin  81 MG TABS Oral  Cetirizine HCl 10 MG Tablet Oral  Diclofenac  Sodium 75 MG Tablet Delayed Release  Famotidine  40 MG Tablet  Fluocinolone  Acetonide 1 PO Daily  Glimepiride  2 MG Tablet Oral  Losartan  Potassium 100 MG Tablet Oral  MetFORMIN  HCl - 500 MG Oral Tablet Oral   Multiple Vitamin TABS Oral  Pantoprazole  Sodium 40 MG Tablet Delayed Release Oral  Simvastatin  40 MG Oral Tablet Oral  Stool Softener     GU PSH: Cysto Uretero Lithotripsy - 2011 Cystoscopy Insert Stent - 2011 ESWL - 2008 Prostate Needle Biopsy - 05/23/2023, 06/10/2022       PSH Notes: Leg Repair   NON-GU PSH: Appendectomy - 2008 Back Surgery (Unspecified) - 2018 Cataract surgery, Bilateral Drain Skin Abscess - 2020 Inguinal hernia repair (laparoscopic) Neck Surgery (Unspecified) Surgical Pathology, Gross And Microscopic Examination For Prostate Needle - 05/23/2023, 06/10/2022 Visit Complexity (formerly GPC1X) - 04/04/2023, 10/04/2022     GU PMH: Prostate Cancer - 05/23/2023, - 04/04/2023, - 10/04/2022, 78 year old male with low volume grade group 2 prostate cancer. He has a very large prostate which may well be the etiologic agent of his PSA elevation., - 06/30/2022 Nocturia - 04/04/2023, - 2018 BPH w/LUTS - 10/04/2022, - 05/26/2022, IPSS acceptable, residual urine volumes stable. On alfuzosin ., - 02/05/2022, Mild to moderate symptomatology, on alfuzosin . Acceptable residual urine volume (measured at 160 with PVR machine but multiple scans averaged approximately 100 mL), - 2023, overall he is voiding fairly well without significant complaints, - 2022 (Stable), He reports mostly stable urinary sx's aside from recently worsening morning frequency -- it is possible this is due to an infection. , - 2020, Stable, moderate symptoms on alpha-blocker, - 2019 (Stable), Symptoms fairly stable on Rapaflo .  We will see about changing from a branded to a generic., Sep 21, 2016, 2016/09/21, 09/22/15, Benign prostatic hyperplasia with urinary obstruction, - 09/22/14 Incomplete bladder emptying - 10/04/2022, Acceptable residual urine volumes, - 02/05/2022 Elevated PSA - 06/10/2022, Region of interest on his prostate ultrasound. Most notable feature is the size of his prostate., - 05/26/2022, Stable exam, over the past few years, stable  PSA. Negative biopsy in 2004/09/21, nonsuspicious MRI in 09/22/14., 09-21-21, he needs a PSA. DRE normal, - September 21, 2020 (Stable), PSA slightly elevated but his UA is POS for bacteria so this could explain this slight increase. Previous bx's negative. Percent free PSA normal. Prostate exam normal. , - 2020 (Stable), Stable PSA/DRE, - 2019 (Stable), Negative biopsy in the past, stable PSA curve, benign exam today., 09-21-2016, September 21, 2016, 09/22/2015, Elevated prostate specific antigen (PSA), - 2014/09/22 Areflexic bladder, He is emptying out much better. 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 2021-09-21, not currently bothersome, - 2020-09-21 (Stable), - 2020, Not currently sexually active, - 09-21-2017 (Stable), They are not interested in treatment, 09/21/2016, 2016-09-21, 09-22-2015, Erectile dysfunction due to arterial insufficiency, - 09/21/12 Inflammatory disorder of unspecified male genital organ - Sep 22, 2018 Renal calculus, No active disease - September 21, 2017, No symptoms over the past year, 09-21-16, 09/21/2016, Sep 22, 2015, Bilateral kidney stones, - 2015-09-22, Nephrolithiasis, - September 22, 2014 Urinary Retention - 09-21-17 Peyronies Disease - 09-21-16, 2016-09-21, Sep 22, 2015, Peyronie's disease, - September 21, 2012 Acute Cystitis/UTI, Acute cystitis without hematuria - Sep 22, 2015 Urinary Tract Inf, Unspec site, Urinary tract infection - 09-22-2015, Pyuria, - 22-Sep-2014 Urinary Calculus, Unspec, Uric acid urolithiasis - 09-22-2014 Abdominal Pain Unspec, Right flank pain - 09-22-2014, Left flank pain, - 09-22-2014 Acute prostatitis, Prostatitis, acute - 2012-09-21 Dysuria, Dysuria - 2014 Flank Pain, Generalized abdominal pain - Sep 21, 2012 Gross hematuria, Gross hematuria - 21-Sep-2012 Other microscopic hematuria, Microscopic hematuria - 09/21/2012 Ureteral calculus, Distal Ureteral Stone On The Right - 09/21/12, Mid Ureteral Stone On The Right, - 09-21-2012, Calculus of ureter, - 09-21-12 Urinary Retention, Unspec, Urinary retention - 2012/09/21      PMH Notes:  2006-01-12  14:16:46 - Note: Venous Thrombosis   NON-GU PMH: Bacteriuria, Asymptomatic - 02/05/2022, more than likely has chronic colonization, not symptomatic, - 2020/09/21 Encounter for general adult medical examination without abnormal findings, Encounter for preventive health examination - 09/22/2014 Gout, Gout - 09-21-2012 Personal history of other diseases of the digestive system, History of esophageal reflux - 09-21-12 Personal history of other endocrine, nutritional and metabolic disease, History of diabetes mellitus - 09/21/12, History of hypercholesterolemia, - 09-21-2012 Sebaceous cyst, Sebaceous cyst - 2012/09/21 Unspecified abdominal hernia without obstruction or gangrene, Hernia - 21-Sep-2012    FAMILY HISTORY: Death In The Family Father - Father Heart Disease - Father nephrolithiasis - Runs in Family   SOCIAL HISTORY: Marital Status: Married Preferred Language: English; Ethnicity: Not Hispanic Or Latino; Race: White Current Smoking Status: Patient has never smoked.  Does not drink caffeine.     Notes: 2 daughters   REVIEW OF SYSTEMS:    GU Review Male:   Patient denies frequent urination, hard to postpone urination, burning/ pain with urination, get up at night to urinate, leakage of urine, stream starts and stops, trouble starting your stream, have to strain to urinate , erection problems, and penile pain.  Gastrointestinal (Upper):   Patient denies nausea, vomiting, and  indigestion/ heartburn.  Gastrointestinal (Lower):   Patient denies diarrhea and constipation.  Constitutional:   Patient denies fever, night sweats, weight loss, and fatigue.  Skin:   Patient denies skin rash/ lesion and itching.  Eyes:   Patient denies double vision and blurred vision.  Ears/ Nose/ Throat:   Patient denies sore throat and sinus problems.  Hematologic/Lymphatic:   Patient denies swollen glands and easy bruising.  Cardiovascular:   Patient denies leg swelling and chest pains.  Respiratory:   Patient denies cough and shortness of breath.   Endocrine:   Patient denies excessive thirst.  Musculoskeletal:   Patient denies back pain and joint pain.  Neurological:   Patient denies headaches and dizziness.  Psychologic:   Patient denies depression and anxiety.   VITAL SIGNS: None   Complexity of Data:  Source Of History:  Patient  Records Review:   Pathology Reports, Previous Doctor Records, Previous Patient Records, POC Tool  Urine Test Review:   Urinalysis   03/28/23 09/27/22 04/19/22 03/19/22 02/05/22 02/04/21 02/04/20 12/05/18  PSA  Total PSA 19.70 ng/mL 8.51 ng/mL 14.60 ng/mL 13.40 ng/mL 12.20 ng/mL 9.37 ng/mL 9.67 ng/mL 9.03 ng/mL  Free PSA      3.63 ng/mL  4.22 ng/mL  % Free PSA      39 % PSA  47 % PSA    11/08/03  Hormones  Testosterone, Total 3.21     PROCEDURES:          Visit Complexity - G2211    ASSESSMENT:      ICD-10 Details  1 GU:   Prostate Cancer - C61      PLAN:            Medications New Meds: Orgovyx 120 MG Tablet 1 tablet PO Daily   #30  5 Refill(s)  Pharmacy Name:  [facility]  Address:  [address] [place], [place] [address]  Phone:  [phone]  Fax:  [phone]            Document Letter(s):  Created for Patient: Clinical Summary         Notes:   The patient, his wife, and his daughter and as went through the upstage and his prostate cancer diagnosis. He now has grade group 4 prostate cancer, albeit quite low volume. We discussed management strategies. I recommended treatment as he has clearly had progression of his cancer over the course of the last 18 months. I recommended external beam radiation given the size of his prostate and the fact that he is not a good surgical candidate. I told him that I thought that he would need at least 6 months of ADT, and if he was tolerating it well I would extend it to 18 months total. We also discussed the SpaceOAR and gold seed implant that would be required for this type of treatment. We discussed the risks and the benefits  as well. He does have a history of urinary retention and a hypotonic bladder, but that seems to have resolved. I do not think he is going to have any difficulty with urinary issues beyond the standard irritative voiding symptoms, but we did discuss the fact that he might need to perform CIC. I will make the referral for Dr. Patrcia and plan accordingly.

## 2023-08-26 NOTE — Anesthesia Procedure Notes (Signed)
 Procedure Name: MAC Date/Time: 08/26/2023 12:45 PM  Performed by: Viviana Almarie DASEN, CRNAPre-anesthesia Checklist: Patient identified, Suction available, Patient being monitored, Emergency Drugs available and Timeout performed Patient Re-evaluated:Patient Re-evaluated prior to induction Oxygen Delivery Method: Nasal cannula Induction Type: IV induction Placement Confirmation: positive ETCO2

## 2023-08-26 NOTE — Op Note (Signed)
 Preoperative diagnosis: Clinically localized adenocarcinoma of the prostate   Postoperative diagnosis: Clinically localized adenocarcinoma of the prostate  Procedure: 1) Placement of fiducial markers into prostate                    2) Insertion of SpaceOAR hydrogel   Surgeon: Morene Salines, M.D.  Anesthesia: General  EBL: Minimal  Complications: None  Indication: [name] is a 78 y.o. gentleman with clinically localized prostate cancer. After discussing management options for treatment, he elected to proceed with radiotherapy. He presents today for the above procedures. The potential risks, complications, alternative options, and expected recovery course have been discussed in detail with the patient and he has provided informed consent to proceed.  Description of procedure: The patient was administered preoperative antibiotics, placed in the dorsal lithotomy position, and prepped and draped in the usual sterile fashion. Next, transrectal ultrasonography was utilized to visualize the prostate. Three gold fiducial markers were then placed into the prostate via transperineal needles under ultrasound guidance at the right apex, right base, and left mid gland under direct ultrasound guidance. A site in the midline was then selected on the perineum for placement of an 18 g needle with saline. The needle was advanced above the rectum and below Denonvillier's fascia to the mid gland and confirmed to be in the midline on transverse imaging. One cc of saline was injected confirming appropriate expansion of this space. A total of 5 cc of saline was then injected to open the space further bilaterally. The saline syringe was then removed and the SpaceOAR hydrogel was injected with good distribution bilaterally. He tolerated the procedure well and without complications. He was given a voiding trial prior to discharge from the PACU.

## 2023-08-26 NOTE — Transfer of Care (Signed)
 Immediate Anesthesia Transfer of Care Note  Patient: [name]  Procedure(s) Performed: INSERTION, GOLD SEEDS INJECTION, HYDROGEL SPACER  Patient Location: PACU  Anesthesia Type:MAC  Level of Consciousness: awake, alert , oriented, and patient cooperative  Airway & Oxygen Therapy: Patient Spontanous Breathing  Post-op Assessment: Report given to RN, Post -op Vital signs reviewed and stable, Patient moving all extremities X 4, and Patient able to stick tongue midline  Post vital signs: Reviewed and stable  Last Vitals:  Vitals Value Taken Time  BP 104/70 08/26/23 13:08  Temp 97.9   Pulse 81 08/26/23 13:10  Resp 23 08/26/23 13:10  SpO2 95 % 08/26/23 13:10  Vitals shown include unfiled device data.  Last Pain:  Vitals:   08/26/23 1041  TempSrc: Oral  PainSc: 0-No pain      Patients Stated Pain Goal: 2 (08/26/23 1041)  Complications: No notable events documented.

## 2023-08-26 NOTE — Interval H&P Note (Signed)
 History and Physical Interval Note:  08/26/2023 12:37 PM  [name]  has presented today for surgery, with the diagnosis of PROSTATE CANCER.  The various methods of treatment have been discussed with the patient and family. After consideration of risks, benefits and other options for treatment, the patient has consented to  Procedure(s) with comments: INSERTION, GOLD SEEDS (N/A) - GOLD SEED IMPLANTS AND SPACEOAR INJECTION, HYDROGEL SPACER (N/A) as a surgical intervention.  The patient's history has been reviewed, patient examined, no change in status, stable for surgery.  I have reviewed the patient's chart and labs.  Questions were answered to the patient's satisfaction.     [name]

## 2023-08-26 NOTE — Discharge Instructions (Addendum)
For several days the patient:  should increase his fluid intake and limit strenuous activity. he might have mild discomfort at the base of his penis or in his rectum. he might have blood in his urine or blood in his bowel movements.  For 2-3 months he might have blood in his ejaculate (semen).  Call the office immedicately:  for blood clots in the urine or bowel movements,  difficulty urinating,  inability to urinate,  urinary retention,  painful or frequent urination,  fever, chills,  nausea, vomiting, other illness.    [facility]:  [phone]    Post Anesthesia Home Care Instructions  Activity: Get plenty of rest for the remainder of the day. A responsible individual must stay with you for 24 hours following the procedure.  For the next 24 hours, DO NOT: -Drive a car -Operate machinery -Drink alcoholic beverages -Take any medication unless instructed by your physician -Make any legal decisions or sign important papers.  Meals: Start with liquid foods such as gelatin or soup. Progress to regular foods as tolerated. Avoid greasy, spicy, heavy foods. If nausea and/or vomiting occur, drink only clear liquids until the nausea and/or vomiting subsides. Call your physician if vomiting continues.  Special Instructions/Symptoms: Your throat may feel dry or sore from the anesthesia or the breathing tube placed in your throat during surgery. If this causes discomfort, gargle with warm salt water. The discomfort should disappear within 24 hours.       

## 2023-08-26 NOTE — Anesthesia Preprocedure Evaluation (Addendum)
 Anesthesia Evaluation  Patient identified by MRN, date of birth, ID band Patient awake    Reviewed: Allergy & Precautions, NPO status , Patient's Chart, lab work & pertinent test results  History of Anesthesia Complications Negative for: history of anesthetic complications  Airway Mallampati: II  TM Distance: >3 FB Neck ROM: Full    Dental  (+) Dental Advisory Given   Pulmonary neg pulmonary ROS   breath sounds clear to auscultation       Cardiovascular hypertension, Pt. on medications (-) angina + DVT   Rhythm:Regular Rate:Normal     Neuro/Psych Chronic back pain    GI/Hepatic Neg liver ROS,GERD  Medicated and Controlled,,  Endo/Other  diabetes (glu 133), Oral Hypoglycemic Agents    Renal/GU stones     Musculoskeletal   Abdominal   Peds  Hematology   Anesthesia Other Findings   Reproductive/Obstetrics                              Anesthesia Physical Anesthesia Plan  ASA: 3  Anesthesia Plan: MAC   Post-op Pain Management: Tylenol  PO (pre-op)*   Induction:   PONV Risk Score and Plan: 1 and Treatment may vary due to age or medical condition  Airway Management Planned: Natural Airway and Nasal Cannula  Additional Equipment: None  Intra-op Plan:   Post-operative Plan:   Informed Consent: I have reviewed the patients History and Physical, chart, labs and discussed the procedure including the risks, benefits and alternatives for the proposed anesthesia with the patient or authorized representative who has indicated his/her understanding and acceptance.     Dental advisory given  Plan Discussed with: CRNA and Surgeon  Anesthesia Plan Comments: (Discussed with patient and his wife)        Anesthesia Quick Evaluation

## 2023-08-29 ENCOUNTER — Ambulatory Visit (HOSPITAL_COMMUNITY)
Admission: RE | Admit: 2023-08-29 | Discharge: 2023-08-29 | Disposition: A | Discharge status: Home / Self Care | Payer: PRIVATE HEALTH INSURANCE | Source: Ambulatory Visit | Attending: Urology | Admitting: Urology

## 2023-08-29 ENCOUNTER — Encounter (HOSPITAL_COMMUNITY): Payer: Self-pay | Admitting: Urology

## 2023-08-29 ENCOUNTER — Ambulatory Visit
Admission: RE | Admit: 2023-08-29 | Discharge: 2023-08-29 | Disposition: A | Discharge status: Home / Self Care | Payer: PRIVATE HEALTH INSURANCE | Source: Ambulatory Visit | Attending: Radiation Oncology | Admitting: Radiation Oncology

## 2023-08-29 DIAGNOSIS — C61 Malignant neoplasm of prostate: Secondary | ICD-10-CM | POA: Diagnosis not present

## 2023-08-29 DIAGNOSIS — Z191 Hormone sensitive malignancy status: Secondary | ICD-10-CM | POA: Diagnosis not present

## 2023-08-29 NOTE — Progress Notes (Signed)
  Radiation Oncology         [phone] ________________________________  Name: [name] MRN: [id number]  Date: 08/29/2023  DOB: 1945/03/06  SIMULATION AND TREATMENT PLANNING NOTE    ICD-10-CM   1. Malignant neoplasm of prostate (HCC)  C61       DIAGNOSIS:  78 y.o. gentleman with Stage T1c adenocarcinoma of the prostate with Gleason score of 4+4, and PSA of 19.7.   NARRATIVE:  The patient was brought to the CT Simulation planning suite.  Identity was confirmed.  All relevant records and images related to the planned course of therapy were reviewed.  The patient freely provided informed written consent to proceed with treatment after reviewing the details related to the planned course of therapy. The consent form was witnessed and verified by the simulation staff.  Then, the patient was set-up in a stable reproducible supine position for radiation therapy.  A vacuum lock pillow device was custom fabricated to position his legs in a reproducible immobilized position.  Then, I performed a urethrogram under sterile conditions to identify the prostatic bed.  CT images were obtained.  MRI and or PET were fused to define the Dominant Intraprostatic Lesion (DIL).  Surface markings were placed.  The CT images were loaded into the planning software.  Then the prostate bed target, pelvic lymph node target and avoidance structures including the rectum, bladder, bowel and hips were contoured.  Treatment planning then occurred.  The radiation prescription was entered and confirmed.  A total of one complex treatment devices were fabricated. I have requested : Intensity Modulated Radiotherapy (IMRT) is medically necessary for this case for the following reason:  Rectal sparing.SABRA  PLAN:  The patient will receive 45 Gy in 25 fractions of 1.8 Gy while the DIL receives a simultaneous integrated boost to 55 Gy in 25 fractions, followed by a boost to the prostate to a total dose of 75 Gy with 15 additional  fractions of 2 Gy while the DIL receives 33 Gy in 15 fractions for a final total dose 75 Gy to the prostate and 88 Gy to the DIL.  ________________________________  Donnice LABOR. Patrcia, M.D.

## 2023-09-02 DIAGNOSIS — C61 Malignant neoplasm of prostate: Secondary | ICD-10-CM | POA: Diagnosis not present

## 2023-09-02 DIAGNOSIS — Z191 Hormone sensitive malignancy status: Secondary | ICD-10-CM | POA: Diagnosis not present

## 2023-09-06 ENCOUNTER — Ambulatory Visit
Admission: RE | Admit: 2023-09-06 | Discharge: 2023-09-06 | Disposition: A | Discharge status: Home / Self Care | Payer: PRIVATE HEALTH INSURANCE | Source: Ambulatory Visit | Attending: Radiation Oncology | Admitting: Radiation Oncology

## 2023-09-06 ENCOUNTER — Other Ambulatory Visit: Payer: Self-pay

## 2023-09-06 DIAGNOSIS — Z510 Encounter for antineoplastic radiation therapy: Secondary | ICD-10-CM | POA: Diagnosis not present

## 2023-09-06 DIAGNOSIS — C61 Malignant neoplasm of prostate: Secondary | ICD-10-CM | POA: Diagnosis not present

## 2023-09-06 DIAGNOSIS — Z191 Hormone sensitive malignancy status: Secondary | ICD-10-CM | POA: Diagnosis not present

## 2023-09-06 LAB — RAD ONC ARIA SESSION SUMMARY
Course Elapsed Days: 0
Plan Fractions Treated to Date: 1
Plan Prescribed Dose Per Fraction: 1.8 Gy
Plan Total Fractions Prescribed: 25
Plan Total Prescribed Dose: 45 Gy
Reference Point Dosage Given to Date: 1.8 Gy
Reference Point Session Dosage Given: 1.8 Gy
Session Number: 1

## 2023-09-07 ENCOUNTER — Other Ambulatory Visit: Payer: Self-pay

## 2023-09-07 ENCOUNTER — Ambulatory Visit
Admission: RE | Admit: 2023-09-07 | Discharge: 2023-09-07 | Disposition: A | Discharge status: Home / Self Care | Payer: PRIVATE HEALTH INSURANCE | Source: Ambulatory Visit | Attending: Radiation Oncology | Admitting: Radiation Oncology

## 2023-09-07 DIAGNOSIS — Z510 Encounter for antineoplastic radiation therapy: Secondary | ICD-10-CM | POA: Diagnosis not present

## 2023-09-07 DIAGNOSIS — Z191 Hormone sensitive malignancy status: Secondary | ICD-10-CM | POA: Diagnosis not present

## 2023-09-07 DIAGNOSIS — C61 Malignant neoplasm of prostate: Secondary | ICD-10-CM | POA: Diagnosis not present

## 2023-09-07 LAB — RAD ONC ARIA SESSION SUMMARY
Course Elapsed Days: 1
Plan Fractions Treated to Date: 2
Plan Prescribed Dose Per Fraction: 1.8 Gy
Plan Total Fractions Prescribed: 25
Plan Total Prescribed Dose: 45 Gy
Reference Point Dosage Given to Date: 3.6 Gy
Reference Point Session Dosage Given: 1.8 Gy
Session Number: 2

## 2023-09-08 ENCOUNTER — Ambulatory Visit
Admission: RE | Admit: 2023-09-08 | Discharge: 2023-09-08 | Disposition: A | Discharge status: Home / Self Care | Payer: PRIVATE HEALTH INSURANCE | Source: Ambulatory Visit | Attending: Radiation Oncology | Admitting: Radiation Oncology

## 2023-09-08 ENCOUNTER — Other Ambulatory Visit: Payer: Self-pay

## 2023-09-08 ENCOUNTER — Other Ambulatory Visit: Payer: Self-pay | Admitting: Family Medicine

## 2023-09-08 DIAGNOSIS — C61 Malignant neoplasm of prostate: Secondary | ICD-10-CM | POA: Diagnosis not present

## 2023-09-08 DIAGNOSIS — Z510 Encounter for antineoplastic radiation therapy: Secondary | ICD-10-CM | POA: Diagnosis not present

## 2023-09-08 DIAGNOSIS — K219 Gastro-esophageal reflux disease without esophagitis: Secondary | ICD-10-CM

## 2023-09-08 DIAGNOSIS — Z191 Hormone sensitive malignancy status: Secondary | ICD-10-CM | POA: Diagnosis not present

## 2023-09-08 LAB — RAD ONC ARIA SESSION SUMMARY
Course Elapsed Days: 2
Plan Fractions Treated to Date: 3
Plan Prescribed Dose Per Fraction: 1.8 Gy
Plan Total Fractions Prescribed: 25
Plan Total Prescribed Dose: 45 Gy
Reference Point Dosage Given to Date: 5.4 Gy
Reference Point Session Dosage Given: 1.8 Gy
Session Number: 3

## 2023-09-08 MED ORDER — ESOMEPRAZOLE MAGNESIUM 40 MG PO CPDR: 40.0000 mg | DELAYED_RELEASE_CAPSULE | Freq: Two times a day (BID) | ORAL | 1 refills | Status: DC

## 2023-09-08 MED ORDER — DICLOFENAC SODIUM 75 MG PO TBEC
DELAYED_RELEASE_TABLET | ORAL | 1 refills | Status: AC
Start: 2023-09-08 — End: ?

## 2023-09-08 NOTE — Telephone Encounter (Signed)
 Copied from CRM [id number]. Topic: Clinical - Medication Refill >> Sep 08, 2023 12:28 PM Suzen RAMAN wrote: Medication: diclofenac  (VOLTAREN ) 75 MG EC tablet, Famotidine  40 mg bid   Has the patient contacted their pharmacy? Yes   This is the patient's preferred pharmacy:  [id number] - [place], [place] - [address] [place] [address] Phone: [phone] Fax: [phone]  Is this the correct pharmacy for this prescription? Yes If no, delete pharmacy and type the correct one.   Has the prescription been filled recently? No  Is the patient out of the medication? No  Has the patient been seen for an appointment in the last year OR does the patient have an upcoming appointment? Yes  Can we respond through MyChart? Yes  Agent: Please be advised that Rx refills may take up to 3 business days. We ask that you follow-up with your pharmacy.

## 2023-09-09 ENCOUNTER — Ambulatory Visit
Admission: RE | Admit: 2023-09-09 | Discharge: 2023-09-09 | Disposition: A | Discharge status: Home / Self Care | Payer: PRIVATE HEALTH INSURANCE | Source: Ambulatory Visit | Attending: Radiation Oncology | Admitting: Radiation Oncology

## 2023-09-09 ENCOUNTER — Other Ambulatory Visit: Payer: Self-pay | Admitting: Family Medicine

## 2023-09-09 ENCOUNTER — Ambulatory Visit
Admission: RE | Admit: 2023-09-09 | Discharge: 2023-09-09 | Disposition: A | Discharge status: Home / Self Care | Source: Ambulatory Visit | Attending: Radiation Oncology | Admitting: Radiation Oncology

## 2023-09-09 ENCOUNTER — Other Ambulatory Visit: Payer: Self-pay

## 2023-09-09 DIAGNOSIS — K219 Gastro-esophageal reflux disease without esophagitis: Secondary | ICD-10-CM

## 2023-09-09 DIAGNOSIS — Z510 Encounter for antineoplastic radiation therapy: Secondary | ICD-10-CM | POA: Diagnosis not present

## 2023-09-09 DIAGNOSIS — Z191 Hormone sensitive malignancy status: Secondary | ICD-10-CM | POA: Diagnosis not present

## 2023-09-09 DIAGNOSIS — C61 Malignant neoplasm of prostate: Secondary | ICD-10-CM | POA: Diagnosis not present

## 2023-09-09 LAB — RAD ONC ARIA SESSION SUMMARY
Course Elapsed Days: 3
Plan Fractions Treated to Date: 4
Plan Prescribed Dose Per Fraction: 1.8 Gy
Plan Total Fractions Prescribed: 25
Plan Total Prescribed Dose: 45 Gy
Reference Point Dosage Given to Date: 7.2 Gy
Reference Point Session Dosage Given: 1.8 Gy
Session Number: 4

## 2023-09-09 NOTE — Telephone Encounter (Signed)
 Patient called back inquiry why medication Famotidine  40 mg was not called into his pharmacy. Patient states he has been taking medication for years. I noticed as well as the patient that this medication isn't on patients medication list which may be the reason the system not allowing the medication to be pend.

## 2023-09-09 NOTE — Telephone Encounter (Signed)
 Copied from CRM [id number]. Topic: Clinical - Medication Refill >> Sep 09, 2023  9:05 AM Suzen RAMAN wrote: Medication: esomeprazole  (NEXIUM ) 40 MG capsule   Has the patient contacted their pharmacy? Yes   This is the patient's preferred pharmacy:   [id number] [place], [place] - [address] [place] [address] Phone: [phone] Fax: [phone]  Is this the correct pharmacy for this prescription? Yes If no, delete pharmacy and type the correct one.   Has the prescription been filled recently? Yes but went to the incorrect pharmacy  Is the patient out of the medication? Yes  Has the patient been seen for an appointment in the last year OR does the patient have an upcoming appointment? Yes  Can we respond through MyChart? Yes  Agent: Please be advised that Rx refills may take up to 3 business days. We ask that you follow-up with your pharmacy.

## 2023-09-12 ENCOUNTER — Other Ambulatory Visit: Payer: Self-pay

## 2023-09-12 ENCOUNTER — Ambulatory Visit
Admission: RE | Admit: 2023-09-12 | Discharge: 2023-09-12 | Disposition: A | Discharge status: Home / Self Care | Payer: PRIVATE HEALTH INSURANCE | Source: Ambulatory Visit | Attending: Radiation Oncology

## 2023-09-12 ENCOUNTER — Telehealth: Payer: Self-pay

## 2023-09-12 DIAGNOSIS — Z510 Encounter for antineoplastic radiation therapy: Secondary | ICD-10-CM | POA: Diagnosis not present

## 2023-09-12 DIAGNOSIS — C61 Malignant neoplasm of prostate: Secondary | ICD-10-CM | POA: Diagnosis not present

## 2023-09-12 DIAGNOSIS — Z191 Hormone sensitive malignancy status: Secondary | ICD-10-CM | POA: Diagnosis not present

## 2023-09-12 LAB — RAD ONC ARIA SESSION SUMMARY
Course Elapsed Days: 6
Plan Fractions Treated to Date: 5
Plan Prescribed Dose Per Fraction: 1.8 Gy
Plan Total Fractions Prescribed: 25
Plan Total Prescribed Dose: 45 Gy
Reference Point Dosage Given to Date: 9 Gy
Reference Point Session Dosage Given: 1.8 Gy
Session Number: 5

## 2023-09-12 NOTE — Telephone Encounter (Unsigned)
 Copied from CRM [id number]. Topic: Clinical - Medication Question >> Sep 12, 2023 10:56 AM Viola F wrote: Reason for CRM: Patient called regarding the famotidine  (PEPCID ) 40 MG tablet [[id number]]  DISCONTINUED - wants to know why it was removed from his medication list because he needs a refill >> Sep 12, 2023 11:07 AM Viola F wrote: Please call [phone] (M)

## 2023-09-13 ENCOUNTER — Other Ambulatory Visit: Payer: Self-pay

## 2023-09-13 ENCOUNTER — Ambulatory Visit
Admission: RE | Admit: 2023-09-13 | Discharge: 2023-09-13 | Disposition: A | Discharge status: Home / Self Care | Payer: PRIVATE HEALTH INSURANCE | Source: Ambulatory Visit | Attending: Radiation Oncology

## 2023-09-13 DIAGNOSIS — Z191 Hormone sensitive malignancy status: Secondary | ICD-10-CM | POA: Diagnosis not present

## 2023-09-13 DIAGNOSIS — K219 Gastro-esophageal reflux disease without esophagitis: Secondary | ICD-10-CM

## 2023-09-13 DIAGNOSIS — C61 Malignant neoplasm of prostate: Secondary | ICD-10-CM | POA: Diagnosis not present

## 2023-09-13 DIAGNOSIS — Z510 Encounter for antineoplastic radiation therapy: Secondary | ICD-10-CM | POA: Diagnosis not present

## 2023-09-13 LAB — RAD ONC ARIA SESSION SUMMARY
Course Elapsed Days: 7
Plan Fractions Treated to Date: 6
Plan Prescribed Dose Per Fraction: 1.8 Gy
Plan Total Fractions Prescribed: 25
Plan Total Prescribed Dose: 45 Gy
Reference Point Dosage Given to Date: 10.8 Gy
Reference Point Session Dosage Given: 1.8 Gy
Session Number: 6

## 2023-09-13 MED ORDER — FAMOTIDINE 40 MG PO TABS: 40.0000 mg | ORAL_TABLET | Freq: Two times a day (BID) | ORAL | 1 refills | Status: DC

## 2023-09-13 MED ORDER — FAMOTIDINE 40 MG PO TABS: 40.0000 mg | ORAL_TABLET | Freq: Two times a day (BID) | ORAL | 2 refills | Status: DC

## 2023-09-13 NOTE — Telephone Encounter (Signed)
 Looks like Nexium  is the active medication in the list, please advise if patient should be taking both? Last OV in April

## 2023-09-13 NOTE — Telephone Encounter (Signed)
 Refill has been sent Pt reports he takes this medication daily

## 2023-09-13 NOTE — Telephone Encounter (Signed)
 It looks like it was D/C'd when he saw Oncology on 6/10.  If he has been taking this regularly- ok to refill

## 2023-09-14 ENCOUNTER — Other Ambulatory Visit: Payer: Self-pay

## 2023-09-14 ENCOUNTER — Ambulatory Visit
Admission: RE | Admit: 2023-09-14 | Discharge: 2023-09-14 | Disposition: A | Discharge status: Home / Self Care | Payer: PRIVATE HEALTH INSURANCE | Source: Ambulatory Visit | Attending: Radiation Oncology | Admitting: Radiation Oncology

## 2023-09-14 DIAGNOSIS — C61 Malignant neoplasm of prostate: Secondary | ICD-10-CM | POA: Diagnosis not present

## 2023-09-14 DIAGNOSIS — Z191 Hormone sensitive malignancy status: Secondary | ICD-10-CM | POA: Diagnosis not present

## 2023-09-14 DIAGNOSIS — Z510 Encounter for antineoplastic radiation therapy: Secondary | ICD-10-CM | POA: Diagnosis not present

## 2023-09-14 LAB — RAD ONC ARIA SESSION SUMMARY
Course Elapsed Days: 8
Plan Fractions Treated to Date: 7
Plan Prescribed Dose Per Fraction: 1.8 Gy
Plan Total Fractions Prescribed: 25
Plan Total Prescribed Dose: 45 Gy
Reference Point Dosage Given to Date: 12.6 Gy
Reference Point Session Dosage Given: 1.8 Gy
Session Number: 7

## 2023-09-15 ENCOUNTER — Other Ambulatory Visit: Payer: Self-pay

## 2023-09-15 ENCOUNTER — Ambulatory Visit
Admission: RE | Admit: 2023-09-15 | Discharge: 2023-09-15 | Discharge status: Home / Self Care | Source: Ambulatory Visit | Attending: Radiation Oncology

## 2023-09-15 ENCOUNTER — Ambulatory Visit
Admission: RE | Admit: 2023-09-15 | Discharge: 2023-09-15 | Disposition: A | Discharge status: Home / Self Care | Payer: PRIVATE HEALTH INSURANCE | Source: Ambulatory Visit | Attending: Radiation Oncology | Admitting: Radiation Oncology

## 2023-09-15 DIAGNOSIS — Z191 Hormone sensitive malignancy status: Secondary | ICD-10-CM | POA: Diagnosis not present

## 2023-09-15 DIAGNOSIS — C61 Malignant neoplasm of prostate: Secondary | ICD-10-CM | POA: Diagnosis not present

## 2023-09-15 DIAGNOSIS — Z510 Encounter for antineoplastic radiation therapy: Secondary | ICD-10-CM | POA: Diagnosis not present

## 2023-09-15 LAB — RAD ONC ARIA SESSION SUMMARY
Course Elapsed Days: 9
Plan Fractions Treated to Date: 8
Plan Prescribed Dose Per Fraction: 1.8 Gy
Plan Total Fractions Prescribed: 25
Plan Total Prescribed Dose: 45 Gy
Reference Point Dosage Given to Date: 14.4 Gy
Reference Point Session Dosage Given: 1.8 Gy
Session Number: 8

## 2023-09-16 ENCOUNTER — Ambulatory Visit
Admission: RE | Admit: 2023-09-16 | Discharge: 2023-09-16 | Disposition: A | Discharge status: Home / Self Care | Payer: PRIVATE HEALTH INSURANCE | Source: Ambulatory Visit | Attending: Radiation Oncology | Admitting: Radiation Oncology

## 2023-09-16 ENCOUNTER — Other Ambulatory Visit: Payer: Self-pay

## 2023-09-16 ENCOUNTER — Ambulatory Visit

## 2023-09-16 DIAGNOSIS — Z191 Hormone sensitive malignancy status: Secondary | ICD-10-CM | POA: Diagnosis not present

## 2023-09-16 DIAGNOSIS — Z510 Encounter for antineoplastic radiation therapy: Secondary | ICD-10-CM | POA: Diagnosis not present

## 2023-09-16 DIAGNOSIS — C61 Malignant neoplasm of prostate: Secondary | ICD-10-CM | POA: Diagnosis not present

## 2023-09-16 LAB — RAD ONC ARIA SESSION SUMMARY
Course Elapsed Days: 10
Plan Fractions Treated to Date: 9
Plan Prescribed Dose Per Fraction: 1.8 Gy
Plan Total Fractions Prescribed: 25
Plan Total Prescribed Dose: 45 Gy
Reference Point Dosage Given to Date: 16.2 Gy
Reference Point Session Dosage Given: 1.8 Gy
Session Number: 9

## 2023-09-17 DIAGNOSIS — M5416 Radiculopathy, lumbar region: Secondary | ICD-10-CM | POA: Diagnosis not present

## 2023-09-19 ENCOUNTER — Encounter: Payer: Self-pay | Admitting: Family Medicine

## 2023-09-19 ENCOUNTER — Other Ambulatory Visit: Payer: Self-pay

## 2023-09-19 ENCOUNTER — Ambulatory Visit
Admission: RE | Admit: 2023-09-19 | Discharge: 2023-09-19 | Disposition: A | Discharge status: Home / Self Care | Payer: PRIVATE HEALTH INSURANCE | Source: Ambulatory Visit | Attending: Radiation Oncology | Admitting: Radiation Oncology

## 2023-09-19 ENCOUNTER — Other Ambulatory Visit: Payer: Self-pay | Admitting: Family Medicine

## 2023-09-19 ENCOUNTER — Ambulatory Visit: Admitting: Family Medicine

## 2023-09-19 VITALS — BP 118/80 | HR 91 | Temp 97.9°F | Ht 70.0 in | Wt 234.5 lb

## 2023-09-19 DIAGNOSIS — Z23 Encounter for immunization: Secondary | ICD-10-CM

## 2023-09-19 DIAGNOSIS — Z510 Encounter for antineoplastic radiation therapy: Secondary | ICD-10-CM | POA: Diagnosis not present

## 2023-09-19 DIAGNOSIS — C61 Malignant neoplasm of prostate: Secondary | ICD-10-CM | POA: Diagnosis not present

## 2023-09-19 DIAGNOSIS — E119 Type 2 diabetes mellitus without complications: Secondary | ICD-10-CM

## 2023-09-19 DIAGNOSIS — R350 Frequency of micturition: Secondary | ICD-10-CM

## 2023-09-19 DIAGNOSIS — E785 Hyperlipidemia, unspecified: Secondary | ICD-10-CM

## 2023-09-19 DIAGNOSIS — Z191 Hormone sensitive malignancy status: Secondary | ICD-10-CM | POA: Diagnosis not present

## 2023-09-19 LAB — RAD ONC ARIA SESSION SUMMARY
Course Elapsed Days: 13
Plan Fractions Treated to Date: 10
Plan Prescribed Dose Per Fraction: 1.8 Gy
Plan Total Fractions Prescribed: 25
Plan Total Prescribed Dose: 45 Gy
Reference Point Dosage Given to Date: 18 Gy
Reference Point Session Dosage Given: 1.8 Gy
Session Number: 10

## 2023-09-19 LAB — POCT URINALYSIS DIPSTICK
Bilirubin, UA: NEGATIVE
Blood, UA: NEGATIVE
Glucose, UA: NEGATIVE
Ketones, UA: NEGATIVE
Leukocytes, UA: NEGATIVE
Nitrite, UA: NEGATIVE
Protein, UA: NEGATIVE
Spec Grav, UA: <=1.005 — AB (ref 1.010–1.025)
Urobilinogen, UA: NEGATIVE U/dL — AB
pH, UA: 6 (ref 5.0–8.0)

## 2023-09-19 NOTE — Progress Notes (Signed)
   Subjective:    Patient ID: [name], male    DOB: Nov 14, 1945, 78 y.o.   MRN: [id number]  HPI Urine frequency- sxs started ~2 weeks ago when he started radiation.  Denies cloudiness, odor, blood.  Prostate team was worried that his urinary frequency could be related to his elevated sugars.  Elevated glucose- pt had cortisone injection and sugar jumped as high as 272.  Has only had 2 readings over 200.  Wife reports they are typically running 70-150s.  Last A1C 5.9% on 08/26/23   Review of Systems For ROS see HPI     Objective:   Physical Exam Vitals reviewed.  Constitutional:      General: He is not in acute distress.    Appearance: Normal appearance. He is not ill-appearing.  HENT:     Head: Normocephalic and atraumatic.  Eyes:     Extraocular Movements: Extraocular movements intact.     Conjunctiva/sclera: Conjunctivae normal.  Skin:    General: Skin is warm and dry.  Neurological:     General: No focal deficit present.     Mental Status: He is alert and oriented to person, place, and time.  Psychiatric:        Mood and Affect: Mood normal.        Behavior: Behavior normal.        Thought Content: Thought content normal.           Assessment & Plan:  Urinary frequency- new.  Sxs started when he began prostate radiation.  Discussed that frequency is a known complication of radiation tx and that his urine today did not look suspicious for infxn.  Will send for culture to be complete.  Also discussed that his sugars are not high enough to be causing him frequency.  Pt is frustrated b/c the urinary sxs are the most bothersome part of radiation.  Wife reports he is drinking 3-4 16oz water bottles before he goes to treatment in the morning and then continues to drink throughout the day.  Encouraged them to scale this back somewhat as he is overly hydrating and this is contributing to his urinary sxs.  Pt expressed understanding and is in agreement w/ plan.   Well  controlled DM- most recent A1C 5.9%  Reassured pt and wife that his sugars are not high enough to be causing his urinary sxs.  They have been checking his sugars 3-4x/day and only 2 readings have been above 200- once when he ate a cookie and once after a steroid shot.  Told them to stop checking his sugars- this is just giving them (particularly his wife) something to stress over.  Discussed that a few outlying readings do not require regular testing.  I personally spent a total of 32 minutes in the care of the patient today including getting/reviewing separately obtained history, counseling and educating, and documenting clinical information in the EHR.

## 2023-09-19 NOTE — Patient Instructions (Addendum)
 Follow up as needed or as scheduled Your urine frequency is not related to your sugars It's most likely related to the radiation (and possibly your increased water intake) Call with any questions or concerns Stay Safe!  Stay Healthy! Hang in there!!!

## 2023-09-20 ENCOUNTER — Ambulatory Visit
Admission: RE | Admit: 2023-09-20 | Discharge: 2023-09-20 | Disposition: A | Discharge status: Home / Self Care | Payer: PRIVATE HEALTH INSURANCE | Source: Ambulatory Visit | Attending: Radiation Oncology | Admitting: Radiation Oncology

## 2023-09-20 ENCOUNTER — Other Ambulatory Visit: Payer: Self-pay

## 2023-09-20 DIAGNOSIS — Z191 Hormone sensitive malignancy status: Secondary | ICD-10-CM | POA: Diagnosis not present

## 2023-09-20 DIAGNOSIS — Z510 Encounter for antineoplastic radiation therapy: Secondary | ICD-10-CM | POA: Diagnosis not present

## 2023-09-20 DIAGNOSIS — C61 Malignant neoplasm of prostate: Secondary | ICD-10-CM | POA: Diagnosis not present

## 2023-09-20 LAB — RAD ONC ARIA SESSION SUMMARY
Course Elapsed Days: 14
Plan Fractions Treated to Date: 11
Plan Prescribed Dose Per Fraction: 1.8 Gy
Plan Total Fractions Prescribed: 25
Plan Total Prescribed Dose: 45 Gy
Reference Point Dosage Given to Date: 19.8 Gy
Reference Point Session Dosage Given: 1.8 Gy
Session Number: 11

## 2023-09-20 LAB — URINE CULTURE
MICRO NUMBER:: 16968535
Result:: NO GROWTH
SPECIMEN QUALITY:: ADEQUATE

## 2023-09-21 ENCOUNTER — Other Ambulatory Visit: Payer: Self-pay

## 2023-09-21 ENCOUNTER — Ambulatory Visit
Admission: RE | Admit: 2023-09-21 | Discharge: 2023-09-21 | Disposition: A | Discharge status: Home / Self Care | Payer: PRIVATE HEALTH INSURANCE | Source: Ambulatory Visit | Attending: Radiation Oncology

## 2023-09-21 ENCOUNTER — Ambulatory Visit: Payer: Self-pay | Admitting: Family Medicine

## 2023-09-21 DIAGNOSIS — D225 Melanocytic nevi of trunk: Secondary | ICD-10-CM | POA: Diagnosis not present

## 2023-09-21 DIAGNOSIS — Z510 Encounter for antineoplastic radiation therapy: Secondary | ICD-10-CM | POA: Diagnosis not present

## 2023-09-21 DIAGNOSIS — D1801 Hemangioma of skin and subcutaneous tissue: Secondary | ICD-10-CM | POA: Diagnosis not present

## 2023-09-21 DIAGNOSIS — L720 Epidermal cyst: Secondary | ICD-10-CM | POA: Diagnosis not present

## 2023-09-21 DIAGNOSIS — D485 Neoplasm of uncertain behavior of skin: Secondary | ICD-10-CM | POA: Diagnosis not present

## 2023-09-21 DIAGNOSIS — L821 Other seborrheic keratosis: Secondary | ICD-10-CM | POA: Diagnosis not present

## 2023-09-21 DIAGNOSIS — L580 Acute radiodermatitis: Secondary | ICD-10-CM | POA: Diagnosis not present

## 2023-09-21 DIAGNOSIS — C61 Malignant neoplasm of prostate: Secondary | ICD-10-CM | POA: Diagnosis not present

## 2023-09-21 DIAGNOSIS — Z191 Hormone sensitive malignancy status: Secondary | ICD-10-CM | POA: Diagnosis not present

## 2023-09-21 LAB — RAD ONC ARIA SESSION SUMMARY
Course Elapsed Days: 15
Plan Fractions Treated to Date: 12
Plan Prescribed Dose Per Fraction: 1.8 Gy
Plan Total Fractions Prescribed: 25
Plan Total Prescribed Dose: 45 Gy
Reference Point Dosage Given to Date: 21.6 Gy
Reference Point Session Dosage Given: 1.8 Gy
Session Number: 12

## 2023-09-22 ENCOUNTER — Ambulatory Visit
Admission: RE | Admit: 2023-09-22 | Discharge: 2023-09-22 | Disposition: A | Discharge status: Home / Self Care | Payer: PRIVATE HEALTH INSURANCE | Source: Ambulatory Visit | Attending: Radiation Oncology

## 2023-09-22 ENCOUNTER — Other Ambulatory Visit: Payer: Self-pay

## 2023-09-22 DIAGNOSIS — Z191 Hormone sensitive malignancy status: Secondary | ICD-10-CM | POA: Diagnosis not present

## 2023-09-22 DIAGNOSIS — Z510 Encounter for antineoplastic radiation therapy: Secondary | ICD-10-CM | POA: Diagnosis not present

## 2023-09-22 DIAGNOSIS — C61 Malignant neoplasm of prostate: Secondary | ICD-10-CM | POA: Diagnosis not present

## 2023-09-22 LAB — RAD ONC ARIA SESSION SUMMARY
Course Elapsed Days: 16
Plan Fractions Treated to Date: 13
Plan Prescribed Dose Per Fraction: 1.8 Gy
Plan Total Fractions Prescribed: 25
Plan Total Prescribed Dose: 45 Gy
Reference Point Dosage Given to Date: 23.4 Gy
Reference Point Session Dosage Given: 1.8 Gy
Session Number: 13

## 2023-09-23 ENCOUNTER — Ambulatory Visit
Admission: RE | Admit: 2023-09-23 | Discharge: 2023-09-23 | Disposition: A | Discharge status: Home / Self Care | Payer: PRIVATE HEALTH INSURANCE | Source: Ambulatory Visit | Attending: Radiation Oncology | Admitting: Radiation Oncology

## 2023-09-23 ENCOUNTER — Ambulatory Visit: Admitting: Family Medicine

## 2023-09-23 ENCOUNTER — Ambulatory Visit
Admission: RE | Admit: 2023-09-23 | Discharge: 2023-09-23 | Disposition: A | Discharge status: Home / Self Care | Source: Ambulatory Visit | Attending: Radiation Oncology | Admitting: Radiation Oncology

## 2023-09-23 ENCOUNTER — Other Ambulatory Visit: Payer: Self-pay

## 2023-09-23 DIAGNOSIS — Z510 Encounter for antineoplastic radiation therapy: Secondary | ICD-10-CM | POA: Diagnosis not present

## 2023-09-23 DIAGNOSIS — Z191 Hormone sensitive malignancy status: Secondary | ICD-10-CM | POA: Diagnosis not present

## 2023-09-23 DIAGNOSIS — C61 Malignant neoplasm of prostate: Secondary | ICD-10-CM | POA: Diagnosis not present

## 2023-09-23 LAB — RAD ONC ARIA SESSION SUMMARY
Course Elapsed Days: 17
Plan Fractions Treated to Date: 14
Plan Prescribed Dose Per Fraction: 1.8 Gy
Plan Total Fractions Prescribed: 25
Plan Total Prescribed Dose: 45 Gy
Reference Point Dosage Given to Date: 25.2 Gy
Reference Point Session Dosage Given: 1.8 Gy
Session Number: 14

## 2023-09-26 ENCOUNTER — Other Ambulatory Visit: Payer: Self-pay

## 2023-09-26 ENCOUNTER — Ambulatory Visit
Admission: RE | Admit: 2023-09-26 | Discharge: 2023-09-26 | Disposition: A | Discharge status: Home / Self Care | Payer: PRIVATE HEALTH INSURANCE | Source: Ambulatory Visit | Attending: Radiation Oncology

## 2023-09-26 DIAGNOSIS — C61 Malignant neoplasm of prostate: Secondary | ICD-10-CM | POA: Diagnosis not present

## 2023-09-26 DIAGNOSIS — Z191 Hormone sensitive malignancy status: Secondary | ICD-10-CM | POA: Diagnosis not present

## 2023-09-26 DIAGNOSIS — Z510 Encounter for antineoplastic radiation therapy: Secondary | ICD-10-CM | POA: Diagnosis not present

## 2023-09-26 LAB — RAD ONC ARIA SESSION SUMMARY
Course Elapsed Days: 20
Plan Fractions Treated to Date: 15
Plan Prescribed Dose Per Fraction: 1.8 Gy
Plan Total Fractions Prescribed: 25
Plan Total Prescribed Dose: 45 Gy
Reference Point Dosage Given to Date: 27 Gy
Reference Point Session Dosage Given: 1.8 Gy
Session Number: 15

## 2023-09-27 ENCOUNTER — Ambulatory Visit
Admission: RE | Admit: 2023-09-27 | Discharge: 2023-09-27 | Disposition: A | Discharge status: Home / Self Care | Payer: PRIVATE HEALTH INSURANCE | Source: Ambulatory Visit | Attending: Radiation Oncology | Admitting: Radiation Oncology

## 2023-09-27 ENCOUNTER — Other Ambulatory Visit: Payer: Self-pay

## 2023-09-27 ENCOUNTER — Other Ambulatory Visit (HOSPITAL_BASED_OUTPATIENT_CLINIC_OR_DEPARTMENT_OTHER): Payer: Self-pay

## 2023-09-27 DIAGNOSIS — Z510 Encounter for antineoplastic radiation therapy: Secondary | ICD-10-CM | POA: Diagnosis not present

## 2023-09-27 DIAGNOSIS — C61 Malignant neoplasm of prostate: Secondary | ICD-10-CM | POA: Diagnosis not present

## 2023-09-27 DIAGNOSIS — Z191 Hormone sensitive malignancy status: Secondary | ICD-10-CM | POA: Diagnosis not present

## 2023-09-27 DIAGNOSIS — Z23 Encounter for immunization: Secondary | ICD-10-CM | POA: Diagnosis not present

## 2023-09-27 LAB — RAD ONC ARIA SESSION SUMMARY
Course Elapsed Days: 21
Plan Fractions Treated to Date: 16
Plan Prescribed Dose Per Fraction: 1.8 Gy
Plan Total Fractions Prescribed: 25
Plan Total Prescribed Dose: 45 Gy
Reference Point Dosage Given to Date: 28.8 Gy
Reference Point Session Dosage Given: 1.8 Gy
Session Number: 16

## 2023-09-27 MED ORDER — COMIRNATY 30 MCG/0.3ML IM SUSY
0.3000 mL | PREFILLED_SYRINGE | Freq: Once | INTRAMUSCULAR | 0 refills | Status: AC
Start: 2023-09-27 — End: 2023-09-28
  Filled 2023-09-27: qty 0.3, 1d supply, fill #0

## 2023-09-28 ENCOUNTER — Ambulatory Visit: Payer: PRIVATE HEALTH INSURANCE

## 2023-09-28 ENCOUNTER — Ambulatory Visit

## 2023-09-29 ENCOUNTER — Ambulatory Visit
Admission: RE | Admit: 2023-09-29 | Discharge: 2023-09-29 | Disposition: A | Discharge status: Home / Self Care | Payer: PRIVATE HEALTH INSURANCE | Source: Ambulatory Visit | Attending: Radiation Oncology

## 2023-09-29 ENCOUNTER — Other Ambulatory Visit: Payer: Self-pay

## 2023-09-29 ENCOUNTER — Ambulatory Visit
Admission: RE | Admit: 2023-09-29 | Discharge: 2023-09-29 | Disposition: A | Discharge status: Home / Self Care | Source: Ambulatory Visit | Attending: Radiation Oncology

## 2023-09-29 DIAGNOSIS — Z191 Hormone sensitive malignancy status: Secondary | ICD-10-CM | POA: Diagnosis not present

## 2023-09-29 DIAGNOSIS — C61 Malignant neoplasm of prostate: Secondary | ICD-10-CM | POA: Diagnosis not present

## 2023-09-29 DIAGNOSIS — Z510 Encounter for antineoplastic radiation therapy: Secondary | ICD-10-CM | POA: Diagnosis not present

## 2023-09-29 LAB — RAD ONC ARIA SESSION SUMMARY
Course Elapsed Days: 23
Plan Fractions Treated to Date: 17
Plan Prescribed Dose Per Fraction: 1.8 Gy
Plan Total Fractions Prescribed: 25
Plan Total Prescribed Dose: 45 Gy
Reference Point Dosage Given to Date: 30.6 Gy
Reference Point Session Dosage Given: 1.8 Gy
Session Number: 17

## 2023-09-30 ENCOUNTER — Other Ambulatory Visit: Payer: Self-pay

## 2023-09-30 ENCOUNTER — Ambulatory Visit
Admission: RE | Admit: 2023-09-30 | Discharge: 2023-09-30 | Disposition: A | Discharge status: Home / Self Care | Payer: PRIVATE HEALTH INSURANCE | Source: Ambulatory Visit | Attending: Radiation Oncology | Admitting: Radiation Oncology

## 2023-09-30 DIAGNOSIS — C61 Malignant neoplasm of prostate: Secondary | ICD-10-CM | POA: Diagnosis not present

## 2023-09-30 DIAGNOSIS — Z510 Encounter for antineoplastic radiation therapy: Secondary | ICD-10-CM | POA: Diagnosis not present

## 2023-09-30 DIAGNOSIS — Z191 Hormone sensitive malignancy status: Secondary | ICD-10-CM | POA: Diagnosis not present

## 2023-09-30 LAB — RAD ONC ARIA SESSION SUMMARY
Course Elapsed Days: 24
Plan Fractions Treated to Date: 18
Plan Prescribed Dose Per Fraction: 1.8 Gy
Plan Total Fractions Prescribed: 25
Plan Total Prescribed Dose: 45 Gy
Reference Point Dosage Given to Date: 32.4 Gy
Reference Point Session Dosage Given: 1.8 Gy
Session Number: 18

## 2023-10-03 ENCOUNTER — Other Ambulatory Visit: Payer: Self-pay

## 2023-10-03 ENCOUNTER — Ambulatory Visit
Admission: RE | Admit: 2023-10-03 | Discharge: 2023-10-03 | Disposition: A | Payer: PRIVATE HEALTH INSURANCE | Source: Ambulatory Visit | Attending: Radiation Oncology

## 2023-10-03 DIAGNOSIS — Z510 Encounter for antineoplastic radiation therapy: Secondary | ICD-10-CM | POA: Diagnosis not present

## 2023-10-03 DIAGNOSIS — Z191 Hormone sensitive malignancy status: Secondary | ICD-10-CM | POA: Diagnosis not present

## 2023-10-03 DIAGNOSIS — C61 Malignant neoplasm of prostate: Secondary | ICD-10-CM | POA: Diagnosis not present

## 2023-10-03 LAB — RAD ONC ARIA SESSION SUMMARY
Course Elapsed Days: 27
Plan Fractions Treated to Date: 19
Plan Prescribed Dose Per Fraction: 1.8 Gy
Plan Total Fractions Prescribed: 25
Plan Total Prescribed Dose: 45 Gy
Reference Point Dosage Given to Date: 34.2 Gy
Reference Point Session Dosage Given: 1.8 Gy
Session Number: 19

## 2023-10-04 ENCOUNTER — Other Ambulatory Visit: Payer: Self-pay

## 2023-10-04 ENCOUNTER — Ambulatory Visit
Admission: RE | Admit: 2023-10-04 | Discharge: 2023-10-04 | Disposition: A | Payer: PRIVATE HEALTH INSURANCE | Source: Ambulatory Visit | Attending: Radiation Oncology

## 2023-10-04 DIAGNOSIS — Z191 Hormone sensitive malignancy status: Secondary | ICD-10-CM | POA: Diagnosis not present

## 2023-10-04 DIAGNOSIS — Z510 Encounter for antineoplastic radiation therapy: Secondary | ICD-10-CM | POA: Diagnosis not present

## 2023-10-04 DIAGNOSIS — C61 Malignant neoplasm of prostate: Secondary | ICD-10-CM | POA: Diagnosis not present

## 2023-10-04 LAB — RAD ONC ARIA SESSION SUMMARY
Course Elapsed Days: 28
Plan Fractions Treated to Date: 20
Plan Prescribed Dose Per Fraction: 1.8 Gy
Plan Total Fractions Prescribed: 25
Plan Total Prescribed Dose: 45 Gy
Reference Point Dosage Given to Date: 36 Gy
Reference Point Session Dosage Given: 1.8 Gy
Session Number: 20

## 2023-10-05 ENCOUNTER — Ambulatory Visit
Admission: RE | Admit: 2023-10-05 | Discharge: 2023-10-05 | Disposition: A | Discharge status: Home / Self Care | Payer: PRIVATE HEALTH INSURANCE | Source: Ambulatory Visit | Attending: Radiation Oncology | Admitting: Radiation Oncology

## 2023-10-05 ENCOUNTER — Other Ambulatory Visit: Payer: Self-pay

## 2023-10-05 DIAGNOSIS — Z510 Encounter for antineoplastic radiation therapy: Secondary | ICD-10-CM | POA: Diagnosis not present

## 2023-10-05 DIAGNOSIS — C61 Malignant neoplasm of prostate: Secondary | ICD-10-CM | POA: Insufficient documentation

## 2023-10-05 DIAGNOSIS — Z191 Hormone sensitive malignancy status: Secondary | ICD-10-CM | POA: Diagnosis not present

## 2023-10-05 LAB — RAD ONC ARIA SESSION SUMMARY
Course Elapsed Days: 29
Plan Fractions Treated to Date: 21
Plan Prescribed Dose Per Fraction: 1.8 Gy
Plan Total Fractions Prescribed: 25
Plan Total Prescribed Dose: 45 Gy
Reference Point Dosage Given to Date: 37.8 Gy
Reference Point Session Dosage Given: 1.8 Gy
Session Number: 21

## 2023-10-06 ENCOUNTER — Ambulatory Visit
Admission: RE | Admit: 2023-10-06 | Discharge: 2023-10-06 | Disposition: A | Discharge status: Home / Self Care | Payer: PRIVATE HEALTH INSURANCE | Source: Ambulatory Visit | Attending: Radiation Oncology | Admitting: Radiation Oncology

## 2023-10-06 ENCOUNTER — Other Ambulatory Visit: Payer: Self-pay

## 2023-10-06 DIAGNOSIS — C61 Malignant neoplasm of prostate: Secondary | ICD-10-CM | POA: Diagnosis not present

## 2023-10-06 DIAGNOSIS — Z191 Hormone sensitive malignancy status: Secondary | ICD-10-CM | POA: Diagnosis not present

## 2023-10-06 DIAGNOSIS — Z510 Encounter for antineoplastic radiation therapy: Secondary | ICD-10-CM | POA: Diagnosis not present

## 2023-10-06 LAB — RAD ONC ARIA SESSION SUMMARY
Course Elapsed Days: 30
Plan Fractions Treated to Date: 22
Plan Prescribed Dose Per Fraction: 1.8 Gy
Plan Total Fractions Prescribed: 25
Plan Total Prescribed Dose: 45 Gy
Reference Point Dosage Given to Date: 39.6 Gy
Reference Point Session Dosage Given: 1.8 Gy
Session Number: 22

## 2023-10-07 ENCOUNTER — Ambulatory Visit
Admission: RE | Admit: 2023-10-07 | Discharge: 2023-10-07 | Disposition: A | Discharge status: Home / Self Care | Source: Ambulatory Visit | Attending: Radiation Oncology | Admitting: Radiation Oncology

## 2023-10-07 ENCOUNTER — Other Ambulatory Visit: Payer: Self-pay

## 2023-10-07 ENCOUNTER — Ambulatory Visit
Admission: RE | Admit: 2023-10-07 | Discharge: 2023-10-07 | Disposition: A | Discharge status: Home / Self Care | Payer: PRIVATE HEALTH INSURANCE | Source: Ambulatory Visit | Attending: Radiation Oncology | Admitting: Radiation Oncology

## 2023-10-07 DIAGNOSIS — C61 Malignant neoplasm of prostate: Secondary | ICD-10-CM | POA: Diagnosis not present

## 2023-10-07 DIAGNOSIS — Z191 Hormone sensitive malignancy status: Secondary | ICD-10-CM | POA: Diagnosis not present

## 2023-10-07 DIAGNOSIS — Z510 Encounter for antineoplastic radiation therapy: Secondary | ICD-10-CM | POA: Diagnosis not present

## 2023-10-07 LAB — RAD ONC ARIA SESSION SUMMARY
Course Elapsed Days: 31
Plan Fractions Treated to Date: 23
Plan Prescribed Dose Per Fraction: 1.8 Gy
Plan Total Fractions Prescribed: 25
Plan Total Prescribed Dose: 45 Gy
Reference Point Dosage Given to Date: 41.4 Gy
Reference Point Session Dosage Given: 1.8 Gy
Session Number: 23

## 2023-10-10 ENCOUNTER — Ambulatory Visit
Admission: RE | Admit: 2023-10-10 | Discharge: 2023-10-10 | Disposition: A | Discharge status: Home / Self Care | Payer: PRIVATE HEALTH INSURANCE | Source: Ambulatory Visit | Attending: Radiation Oncology | Admitting: Radiation Oncology

## 2023-10-10 ENCOUNTER — Other Ambulatory Visit: Payer: Self-pay

## 2023-10-10 DIAGNOSIS — Z191 Hormone sensitive malignancy status: Secondary | ICD-10-CM | POA: Diagnosis not present

## 2023-10-10 DIAGNOSIS — C61 Malignant neoplasm of prostate: Secondary | ICD-10-CM | POA: Diagnosis not present

## 2023-10-10 DIAGNOSIS — Z510 Encounter for antineoplastic radiation therapy: Secondary | ICD-10-CM | POA: Diagnosis not present

## 2023-10-10 DIAGNOSIS — Z23 Encounter for immunization: Secondary | ICD-10-CM | POA: Diagnosis not present

## 2023-10-10 LAB — RAD ONC ARIA SESSION SUMMARY
Course Elapsed Days: 34
Plan Fractions Treated to Date: 24
Plan Prescribed Dose Per Fraction: 1.8 Gy
Plan Total Fractions Prescribed: 25
Plan Total Prescribed Dose: 45 Gy
Reference Point Dosage Given to Date: 43.2 Gy
Reference Point Session Dosage Given: 1.8 Gy
Session Number: 24

## 2023-10-10 NOTE — Addendum Note (Signed)
 Addended by: SHARA BASCOM RAMAN on: 10/10/2023 03:33 PM   Modules accepted: Orders

## 2023-10-11 ENCOUNTER — Ambulatory Visit: Payer: Medicare Other | Admitting: Family Medicine

## 2023-10-11 ENCOUNTER — Encounter: Payer: Self-pay | Admitting: Family Medicine

## 2023-10-11 ENCOUNTER — Ambulatory Visit
Admission: RE | Admit: 2023-10-11 | Discharge: 2023-10-11 | Disposition: A | Discharge status: Home / Self Care | Payer: PRIVATE HEALTH INSURANCE | Source: Ambulatory Visit | Attending: Radiation Oncology | Admitting: Radiation Oncology

## 2023-10-11 ENCOUNTER — Other Ambulatory Visit: Payer: Self-pay

## 2023-10-11 VITALS — BP 128/78 | HR 60 | Temp 98.0°F | Ht 70.0 in | Wt 228.4 lb

## 2023-10-11 DIAGNOSIS — Z7984 Long term (current) use of oral hypoglycemic drugs: Secondary | ICD-10-CM

## 2023-10-11 DIAGNOSIS — I10 Essential (primary) hypertension: Secondary | ICD-10-CM

## 2023-10-11 DIAGNOSIS — E119 Type 2 diabetes mellitus without complications: Secondary | ICD-10-CM | POA: Diagnosis not present

## 2023-10-11 DIAGNOSIS — C61 Malignant neoplasm of prostate: Secondary | ICD-10-CM | POA: Diagnosis not present

## 2023-10-11 DIAGNOSIS — Z191 Hormone sensitive malignancy status: Secondary | ICD-10-CM | POA: Diagnosis not present

## 2023-10-11 DIAGNOSIS — Z510 Encounter for antineoplastic radiation therapy: Secondary | ICD-10-CM | POA: Diagnosis not present

## 2023-10-11 DIAGNOSIS — E785 Hyperlipidemia, unspecified: Secondary | ICD-10-CM | POA: Diagnosis not present

## 2023-10-11 LAB — BASIC METABOLIC PANEL WITH GFR
BUN: 26 mg/dL — ABNORMAL HIGH (ref 6–23)
CO2: 26 meq/L (ref 19–32)
Calcium: 9.6 mg/dL (ref 8.4–10.5)
Chloride: 106 meq/L (ref 96–112)
Creatinine, Ser: 0.99 mg/dL (ref 0.40–1.50)
GFR: 72.81 mL/min (ref >60.00)
Glucose, Bld: 129 mg/dL — ABNORMAL HIGH (ref 70–99)
Potassium: 4.7 meq/L (ref 3.5–5.1)
Sodium: 140 meq/L (ref 135–145)

## 2023-10-11 LAB — HEPATIC FUNCTION PANEL
ALT: 23 U/L (ref 0–53)
AST: 18 U/L (ref 0–37)
Albumin: 4.1 g/dL (ref 3.5–5.2)
Alkaline Phosphatase: 34 U/L — ABNORMAL LOW (ref 39–117)
Bilirubin, Direct: 0.1 mg/dL (ref 0.0–0.3)
Total Bilirubin: 0.5 mg/dL (ref 0.2–1.2)
Total Protein: 6.5 g/dL (ref 6.0–8.3)

## 2023-10-11 LAB — TSH: TSH: 0.43 u[IU]/mL (ref 0.35–5.50)

## 2023-10-11 LAB — CBC WITH DIFFERENTIAL/PLATELET
Basophils Absolute: 0 K/uL (ref 0.0–0.1)
Basophils Relative: 0.2 % (ref 0.0–3.0)
Eosinophils Absolute: 0.2 K/uL (ref 0.0–0.7)
Eosinophils Relative: 5.1 % — ABNORMAL HIGH (ref 0.0–5.0)
HCT: 37.6 % — ABNORMAL LOW (ref 39.0–52.0)
Hemoglobin: 12.6 g/dL — ABNORMAL LOW (ref 13.0–17.0)
Lymphocytes Relative: 7.5 % — ABNORMAL LOW (ref 12.0–46.0)
Lymphs Abs: 0.3 K/uL — ABNORMAL LOW (ref 0.7–4.0)
MCHC: 33.4 g/dL (ref 30.0–36.0)
MCV: 93.6 fl (ref 78.0–100.0)
Monocytes Absolute: 0.8 K/uL (ref 0.1–1.0)
Monocytes Relative: 18.8 % — ABNORMAL HIGH (ref 3.0–12.0)
Neutro Abs: 3 K/uL (ref 1.4–7.7)
Neutrophils Relative %: 68.4 % (ref 43.0–77.0)
Platelets: 229 K/uL (ref 150.0–400.0)
RBC: 4.02 Mil/uL — ABNORMAL LOW (ref 4.22–5.81)
RDW: 14.1 % (ref 11.5–15.5)
WBC: 4.5 K/uL (ref 4.0–10.5)

## 2023-10-11 LAB — LIPID PANEL
Cholesterol: 108 mg/dL (ref 0–200)
HDL: 39.1 mg/dL (ref >39.00)
LDL Cholesterol: 42 mg/dL (ref 0–99)
NonHDL: 69.05
Total CHOL/HDL Ratio: 3
Triglycerides: 137 mg/dL (ref 0.0–149.0)
VLDL: 27.4 mg/dL (ref 0.0–40.0)

## 2023-10-11 LAB — RAD ONC ARIA SESSION SUMMARY
Course Elapsed Days: 35
Plan Fractions Treated to Date: 25
Plan Prescribed Dose Per Fraction: 1.8 Gy
Plan Total Fractions Prescribed: 25
Plan Total Prescribed Dose: 45 Gy
Reference Point Dosage Given to Date: 45 Gy
Reference Point Session Dosage Given: 1.8 Gy
Session Number: 25

## 2023-10-11 MED ORDER — GLIMEPIRIDE 2 MG PO TABS: ORAL_TABLET | ORAL | 1 refills | Status: AC

## 2023-10-11 NOTE — Assessment & Plan Note (Signed)
 Recent A1C 5.9%.  currently on Metformin  and Amaryl  w/o difficulty.  UTD on microalbumin, foot exam, and eye exam.  No med changes at this time.  Will continue to follow.

## 2023-10-11 NOTE — Assessment & Plan Note (Signed)
 Chronic problem.  On Losartan 100mg  daily w/ good control.  Currently asymptomatic.  Check labs due to ARB use but no anticipated med changes.

## 2023-10-11 NOTE — Assessment & Plan Note (Signed)
 Chronic problem.  On Simvastatin  40mg  daily w/o difficulty.  Check labs.  Adjust meds prn

## 2023-10-11 NOTE — Patient Instructions (Signed)
 Follow up in 6 months to recheck sugar, blood pressure, and cholesterol We'll notify you of your lab results and make any changes if needed Continue to work on healthy diet and regular exercise- you look great! REST when needed! Call with any questions or concerns Stay Safe!  Stay Healthy! Hang in there!!!

## 2023-10-11 NOTE — Progress Notes (Signed)
   Subjective:    Patient ID: [name], male    DOB: 1945-05-23, 78 y.o.   MRN: [id number]  HPI DM- chronic problem, on Metformin  500mg  BID, Amaryl  2mg  daily w/ excellent control.  Last A1C 5.9%  UTD on microalbumin, foot exam, eye exam.  Denies symptomatic lows.  HTN- chronic problem, on Losartan  100mg  daily w/ good control.  Denies CP, SOB, HA's, visual changes, edema  Hyperlipidemia- chronic problem, on Simvastatin  40mg  daily.  Denies abd pain, N/V.   Review of Systems For ROS see HPI     Objective:   Physical Exam Vitals reviewed.  Constitutional:      General: He is not in acute distress.    Appearance: Normal appearance. He is well-developed. He is obese. He is not ill-appearing.  HENT:     Head: Normocephalic and atraumatic.  Eyes:     Extraocular Movements: Extraocular movements intact.     Conjunctiva/sclera: Conjunctivae normal.     Pupils: Pupils are equal, round, and reactive to light.  Neck:     Thyroid : No thyromegaly.  Cardiovascular:     Rate and Rhythm: Normal rate and regular rhythm.     Pulses: Normal pulses.     Heart sounds: Normal heart sounds. No murmur heard. Pulmonary:     Effort: Pulmonary effort is normal. No respiratory distress.     Breath sounds: Normal breath sounds.  Abdominal:     General: Bowel sounds are normal. There is no distension.     Palpations: Abdomen is soft.  Musculoskeletal:     Cervical back: Normal range of motion and neck supple.     Right lower leg: No edema.     Left lower leg: No edema.  Lymphadenopathy:     Cervical: No cervical adenopathy.  Skin:    General: Skin is warm and dry.  Neurological:     General: No focal deficit present.     Mental Status: He is alert and oriented to person, place, and time.     Cranial Nerves: No cranial nerve deficit.  Psychiatric:        Mood and Affect: Mood normal.        Behavior: Behavior normal.           Assessment & Plan:

## 2023-10-12 ENCOUNTER — Other Ambulatory Visit: Payer: Self-pay

## 2023-10-12 ENCOUNTER — Ambulatory Visit: Payer: Self-pay | Admitting: Family Medicine

## 2023-10-12 ENCOUNTER — Ambulatory Visit
Admission: RE | Admit: 2023-10-12 | Discharge: 2023-10-12 | Payer: PRIVATE HEALTH INSURANCE | Attending: Radiation Oncology

## 2023-10-12 DIAGNOSIS — Z510 Encounter for antineoplastic radiation therapy: Secondary | ICD-10-CM | POA: Diagnosis not present

## 2023-10-12 DIAGNOSIS — Z191 Hormone sensitive malignancy status: Secondary | ICD-10-CM | POA: Diagnosis not present

## 2023-10-12 DIAGNOSIS — C61 Malignant neoplasm of prostate: Secondary | ICD-10-CM | POA: Diagnosis not present

## 2023-10-12 LAB — RAD ONC ARIA SESSION SUMMARY
Course Elapsed Days: 36
Plan Fractions Treated to Date: 1
Plan Prescribed Dose Per Fraction: 2 Gy
Plan Total Fractions Prescribed: 15
Plan Total Prescribed Dose: 30 Gy
Reference Point Dosage Given to Date: 2 Gy
Reference Point Session Dosage Given: 2 Gy
Session Number: 26

## 2023-10-13 ENCOUNTER — Other Ambulatory Visit: Payer: Self-pay

## 2023-10-13 ENCOUNTER — Ambulatory Visit
Admission: RE | Admit: 2023-10-13 | Discharge: 2023-10-13 | Disposition: A | Discharge status: Home / Self Care | Source: Ambulatory Visit | Attending: Radiation Oncology | Admitting: Radiation Oncology

## 2023-10-13 ENCOUNTER — Ambulatory Visit
Admission: RE | Admit: 2023-10-13 | Discharge: 2023-10-13 | Disposition: A | Payer: PRIVATE HEALTH INSURANCE | Source: Ambulatory Visit | Attending: Radiation Oncology

## 2023-10-13 DIAGNOSIS — Z510 Encounter for antineoplastic radiation therapy: Secondary | ICD-10-CM | POA: Diagnosis not present

## 2023-10-13 DIAGNOSIS — Z191 Hormone sensitive malignancy status: Secondary | ICD-10-CM | POA: Diagnosis not present

## 2023-10-13 DIAGNOSIS — C61 Malignant neoplasm of prostate: Secondary | ICD-10-CM | POA: Diagnosis not present

## 2023-10-13 LAB — RAD ONC ARIA SESSION SUMMARY
Course Elapsed Days: 37
Plan Fractions Treated to Date: 2
Plan Prescribed Dose Per Fraction: 2 Gy
Plan Total Fractions Prescribed: 15
Plan Total Prescribed Dose: 30 Gy
Reference Point Dosage Given to Date: 4 Gy
Reference Point Session Dosage Given: 2 Gy
Session Number: 27

## 2023-10-14 ENCOUNTER — Other Ambulatory Visit: Payer: Self-pay

## 2023-10-14 ENCOUNTER — Ambulatory Visit
Admission: RE | Admit: 2023-10-14 | Discharge: 2023-10-14 | Disposition: A | Discharge status: Home / Self Care | Payer: PRIVATE HEALTH INSURANCE | Source: Ambulatory Visit | Attending: Radiation Oncology | Admitting: Radiation Oncology

## 2023-10-14 ENCOUNTER — Ambulatory Visit

## 2023-10-14 DIAGNOSIS — Z510 Encounter for antineoplastic radiation therapy: Secondary | ICD-10-CM | POA: Diagnosis not present

## 2023-10-14 DIAGNOSIS — C61 Malignant neoplasm of prostate: Secondary | ICD-10-CM | POA: Diagnosis not present

## 2023-10-14 DIAGNOSIS — Z191 Hormone sensitive malignancy status: Secondary | ICD-10-CM | POA: Diagnosis not present

## 2023-10-14 LAB — RAD ONC ARIA SESSION SUMMARY
Course Elapsed Days: 38
Plan Fractions Treated to Date: 3
Plan Prescribed Dose Per Fraction: 2 Gy
Plan Total Fractions Prescribed: 15
Plan Total Prescribed Dose: 30 Gy
Reference Point Dosage Given to Date: 6 Gy
Reference Point Session Dosage Given: 2 Gy
Session Number: 28

## 2023-10-17 ENCOUNTER — Ambulatory Visit
Admission: RE | Admit: 2023-10-17 | Discharge: 2023-10-17 | Disposition: A | Payer: PRIVATE HEALTH INSURANCE | Source: Ambulatory Visit | Attending: Radiation Oncology

## 2023-10-17 ENCOUNTER — Other Ambulatory Visit: Payer: Self-pay

## 2023-10-17 DIAGNOSIS — C61 Malignant neoplasm of prostate: Secondary | ICD-10-CM | POA: Diagnosis not present

## 2023-10-17 DIAGNOSIS — Z510 Encounter for antineoplastic radiation therapy: Secondary | ICD-10-CM | POA: Diagnosis not present

## 2023-10-17 DIAGNOSIS — Z191 Hormone sensitive malignancy status: Secondary | ICD-10-CM | POA: Diagnosis not present

## 2023-10-17 LAB — RAD ONC ARIA SESSION SUMMARY
Course Elapsed Days: 41
Plan Fractions Treated to Date: 4
Plan Prescribed Dose Per Fraction: 2 Gy
Plan Total Fractions Prescribed: 15
Plan Total Prescribed Dose: 30 Gy
Reference Point Dosage Given to Date: 8 Gy
Reference Point Session Dosage Given: 2 Gy
Session Number: 29

## 2023-10-18 ENCOUNTER — Other Ambulatory Visit: Payer: Self-pay

## 2023-10-18 ENCOUNTER — Ambulatory Visit
Admission: RE | Admit: 2023-10-18 | Discharge: 2023-10-18 | Disposition: A | Discharge status: Home / Self Care | Payer: PRIVATE HEALTH INSURANCE | Source: Ambulatory Visit | Attending: Radiation Oncology | Admitting: Radiation Oncology

## 2023-10-18 DIAGNOSIS — Z191 Hormone sensitive malignancy status: Secondary | ICD-10-CM | POA: Diagnosis not present

## 2023-10-18 DIAGNOSIS — C61 Malignant neoplasm of prostate: Secondary | ICD-10-CM | POA: Diagnosis not present

## 2023-10-18 DIAGNOSIS — Z510 Encounter for antineoplastic radiation therapy: Secondary | ICD-10-CM | POA: Diagnosis not present

## 2023-10-18 LAB — RAD ONC ARIA SESSION SUMMARY
Course Elapsed Days: 42
Plan Fractions Treated to Date: 5
Plan Prescribed Dose Per Fraction: 2 Gy
Plan Total Fractions Prescribed: 15
Plan Total Prescribed Dose: 30 Gy
Reference Point Dosage Given to Date: 10 Gy
Reference Point Session Dosage Given: 2 Gy
Session Number: 30

## 2023-10-19 ENCOUNTER — Other Ambulatory Visit: Payer: Self-pay

## 2023-10-19 ENCOUNTER — Ambulatory Visit
Admission: RE | Admit: 2023-10-19 | Discharge: 2023-10-19 | Disposition: A | Discharge status: Home / Self Care | Payer: PRIVATE HEALTH INSURANCE | Source: Ambulatory Visit | Attending: Radiation Oncology | Admitting: Radiation Oncology

## 2023-10-19 DIAGNOSIS — Z191 Hormone sensitive malignancy status: Secondary | ICD-10-CM | POA: Diagnosis not present

## 2023-10-19 DIAGNOSIS — Z510 Encounter for antineoplastic radiation therapy: Secondary | ICD-10-CM | POA: Diagnosis not present

## 2023-10-19 DIAGNOSIS — C61 Malignant neoplasm of prostate: Secondary | ICD-10-CM | POA: Diagnosis not present

## 2023-10-19 LAB — RAD ONC ARIA SESSION SUMMARY
Course Elapsed Days: 43
Plan Fractions Treated to Date: 6
Plan Prescribed Dose Per Fraction: 2 Gy
Plan Total Fractions Prescribed: 15
Plan Total Prescribed Dose: 30 Gy
Reference Point Dosage Given to Date: 12 Gy
Reference Point Session Dosage Given: 2 Gy
Session Number: 31

## 2023-10-20 ENCOUNTER — Other Ambulatory Visit: Payer: Self-pay

## 2023-10-20 ENCOUNTER — Ambulatory Visit
Admission: RE | Admit: 2023-10-20 | Discharge: 2023-10-20 | Disposition: A | Discharge status: Home / Self Care | Payer: PRIVATE HEALTH INSURANCE | Source: Ambulatory Visit | Attending: Radiation Oncology | Admitting: Radiation Oncology

## 2023-10-20 DIAGNOSIS — C61 Malignant neoplasm of prostate: Secondary | ICD-10-CM | POA: Diagnosis not present

## 2023-10-20 DIAGNOSIS — Z510 Encounter for antineoplastic radiation therapy: Secondary | ICD-10-CM | POA: Diagnosis not present

## 2023-10-20 DIAGNOSIS — Z191 Hormone sensitive malignancy status: Secondary | ICD-10-CM | POA: Diagnosis not present

## 2023-10-20 LAB — RAD ONC ARIA SESSION SUMMARY
Course Elapsed Days: 44
Plan Fractions Treated to Date: 7
Plan Prescribed Dose Per Fraction: 2 Gy
Plan Total Fractions Prescribed: 15
Plan Total Prescribed Dose: 30 Gy
Reference Point Dosage Given to Date: 14 Gy
Reference Point Session Dosage Given: 2 Gy
Session Number: 32

## 2023-10-21 ENCOUNTER — Other Ambulatory Visit: Payer: Self-pay

## 2023-10-21 ENCOUNTER — Ambulatory Visit
Admission: RE | Admit: 2023-10-21 | Discharge: 2023-10-21 | Disposition: A | Source: Ambulatory Visit | Attending: Radiation Oncology

## 2023-10-21 ENCOUNTER — Ambulatory Visit
Admission: RE | Admit: 2023-10-21 | Discharge: 2023-10-21 | Disposition: A | Payer: PRIVATE HEALTH INSURANCE | Source: Ambulatory Visit | Attending: Radiation Oncology

## 2023-10-21 DIAGNOSIS — Z191 Hormone sensitive malignancy status: Secondary | ICD-10-CM | POA: Diagnosis not present

## 2023-10-21 DIAGNOSIS — Z510 Encounter for antineoplastic radiation therapy: Secondary | ICD-10-CM | POA: Diagnosis not present

## 2023-10-21 DIAGNOSIS — C61 Malignant neoplasm of prostate: Secondary | ICD-10-CM | POA: Diagnosis not present

## 2023-10-21 LAB — RAD ONC ARIA SESSION SUMMARY
Course Elapsed Days: 45
Plan Fractions Treated to Date: 8
Plan Prescribed Dose Per Fraction: 2 Gy
Plan Total Fractions Prescribed: 15
Plan Total Prescribed Dose: 30 Gy
Reference Point Dosage Given to Date: 16 Gy
Reference Point Session Dosage Given: 2 Gy
Session Number: 33

## 2023-10-24 ENCOUNTER — Ambulatory Visit
Admission: RE | Admit: 2023-10-24 | Discharge: 2023-10-24 | Disposition: A | Payer: PRIVATE HEALTH INSURANCE | Source: Ambulatory Visit | Attending: Radiation Oncology

## 2023-10-24 ENCOUNTER — Other Ambulatory Visit: Payer: Self-pay

## 2023-10-24 DIAGNOSIS — Z191 Hormone sensitive malignancy status: Secondary | ICD-10-CM | POA: Diagnosis not present

## 2023-10-24 DIAGNOSIS — Z510 Encounter for antineoplastic radiation therapy: Secondary | ICD-10-CM | POA: Diagnosis not present

## 2023-10-24 DIAGNOSIS — C61 Malignant neoplasm of prostate: Secondary | ICD-10-CM | POA: Diagnosis not present

## 2023-10-24 LAB — RAD ONC ARIA SESSION SUMMARY
Course Elapsed Days: 48
Plan Fractions Treated to Date: 9
Plan Prescribed Dose Per Fraction: 2 Gy
Plan Total Fractions Prescribed: 15
Plan Total Prescribed Dose: 30 Gy
Reference Point Dosage Given to Date: 18 Gy
Reference Point Session Dosage Given: 2 Gy
Session Number: 34

## 2023-10-25 ENCOUNTER — Ambulatory Visit
Admission: RE | Admit: 2023-10-25 | Discharge: 2023-10-25 | Disposition: A | Payer: PRIVATE HEALTH INSURANCE | Source: Ambulatory Visit | Attending: Radiation Oncology

## 2023-10-25 ENCOUNTER — Other Ambulatory Visit: Payer: Self-pay

## 2023-10-25 DIAGNOSIS — Z510 Encounter for antineoplastic radiation therapy: Secondary | ICD-10-CM | POA: Diagnosis not present

## 2023-10-25 DIAGNOSIS — C61 Malignant neoplasm of prostate: Secondary | ICD-10-CM | POA: Diagnosis not present

## 2023-10-25 DIAGNOSIS — Z191 Hormone sensitive malignancy status: Secondary | ICD-10-CM | POA: Diagnosis not present

## 2023-10-25 LAB — RAD ONC ARIA SESSION SUMMARY
Course Elapsed Days: 49
Plan Fractions Treated to Date: 10
Plan Prescribed Dose Per Fraction: 2 Gy
Plan Total Fractions Prescribed: 15
Plan Total Prescribed Dose: 30 Gy
Reference Point Dosage Given to Date: 20 Gy
Reference Point Session Dosage Given: 2 Gy
Session Number: 35

## 2023-10-26 ENCOUNTER — Other Ambulatory Visit: Payer: Self-pay

## 2023-10-26 ENCOUNTER — Ambulatory Visit
Admission: RE | Admit: 2023-10-26 | Discharge: 2023-10-26 | Disposition: A | Discharge status: Home / Self Care | Payer: PRIVATE HEALTH INSURANCE | Source: Ambulatory Visit | Attending: Radiation Oncology | Admitting: Radiation Oncology

## 2023-10-26 DIAGNOSIS — Z510 Encounter for antineoplastic radiation therapy: Secondary | ICD-10-CM | POA: Diagnosis not present

## 2023-10-26 DIAGNOSIS — Z191 Hormone sensitive malignancy status: Secondary | ICD-10-CM | POA: Diagnosis not present

## 2023-10-26 DIAGNOSIS — C61 Malignant neoplasm of prostate: Secondary | ICD-10-CM | POA: Diagnosis not present

## 2023-10-26 LAB — RAD ONC ARIA SESSION SUMMARY
Course Elapsed Days: 50
Plan Fractions Treated to Date: 11
Plan Prescribed Dose Per Fraction: 2 Gy
Plan Total Fractions Prescribed: 15
Plan Total Prescribed Dose: 30 Gy
Reference Point Dosage Given to Date: 22 Gy
Reference Point Session Dosage Given: 2 Gy
Session Number: 36

## 2023-10-27 ENCOUNTER — Other Ambulatory Visit: Payer: Self-pay

## 2023-10-27 ENCOUNTER — Ambulatory Visit
Admission: RE | Admit: 2023-10-27 | Discharge: 2023-10-27 | Disposition: A | Payer: PRIVATE HEALTH INSURANCE | Source: Ambulatory Visit | Attending: Radiation Oncology

## 2023-10-27 ENCOUNTER — Ambulatory Visit
Admission: RE | Admit: 2023-10-27 | Discharge: 2023-10-27 | Disposition: A | Discharge status: Home / Self Care | Source: Ambulatory Visit | Attending: Radiation Oncology | Admitting: Radiation Oncology

## 2023-10-27 DIAGNOSIS — Z191 Hormone sensitive malignancy status: Secondary | ICD-10-CM | POA: Diagnosis not present

## 2023-10-27 DIAGNOSIS — Z510 Encounter for antineoplastic radiation therapy: Secondary | ICD-10-CM | POA: Diagnosis not present

## 2023-10-27 DIAGNOSIS — C61 Malignant neoplasm of prostate: Secondary | ICD-10-CM | POA: Diagnosis not present

## 2023-10-27 LAB — RAD ONC ARIA SESSION SUMMARY
Course Elapsed Days: 51
Plan Fractions Treated to Date: 12
Plan Prescribed Dose Per Fraction: 2 Gy
Plan Total Fractions Prescribed: 15
Plan Total Prescribed Dose: 30 Gy
Reference Point Dosage Given to Date: 24 Gy
Reference Point Session Dosage Given: 2 Gy
Session Number: 37

## 2023-10-28 ENCOUNTER — Ambulatory Visit
Admission: RE | Admit: 2023-10-28 | Discharge: 2023-10-28 | Disposition: A | Discharge status: Home / Self Care | Payer: PRIVATE HEALTH INSURANCE | Source: Ambulatory Visit | Attending: Radiation Oncology | Admitting: Radiation Oncology

## 2023-10-28 ENCOUNTER — Other Ambulatory Visit: Payer: Self-pay

## 2023-10-28 DIAGNOSIS — Z191 Hormone sensitive malignancy status: Secondary | ICD-10-CM | POA: Diagnosis not present

## 2023-10-28 DIAGNOSIS — C61 Malignant neoplasm of prostate: Secondary | ICD-10-CM | POA: Diagnosis not present

## 2023-10-28 DIAGNOSIS — Z510 Encounter for antineoplastic radiation therapy: Secondary | ICD-10-CM | POA: Diagnosis not present

## 2023-10-28 LAB — RAD ONC ARIA SESSION SUMMARY
Course Elapsed Days: 52
Plan Fractions Treated to Date: 13
Plan Prescribed Dose Per Fraction: 2 Gy
Plan Total Fractions Prescribed: 15
Plan Total Prescribed Dose: 30 Gy
Reference Point Dosage Given to Date: 26 Gy
Reference Point Session Dosage Given: 2 Gy
Session Number: 38

## 2023-10-31 ENCOUNTER — Ambulatory Visit: Payer: PRIVATE HEALTH INSURANCE

## 2023-10-31 ENCOUNTER — Other Ambulatory Visit: Payer: Self-pay

## 2023-10-31 ENCOUNTER — Ambulatory Visit
Admission: RE | Admit: 2023-10-31 | Discharge: 2023-10-31 | Disposition: A | Source: Ambulatory Visit | Attending: Radiation Oncology

## 2023-10-31 DIAGNOSIS — C61 Malignant neoplasm of prostate: Secondary | ICD-10-CM | POA: Diagnosis not present

## 2023-10-31 DIAGNOSIS — Z191 Hormone sensitive malignancy status: Secondary | ICD-10-CM | POA: Diagnosis not present

## 2023-10-31 DIAGNOSIS — Z510 Encounter for antineoplastic radiation therapy: Secondary | ICD-10-CM | POA: Diagnosis not present

## 2023-10-31 LAB — RAD ONC ARIA SESSION SUMMARY
Course Elapsed Days: 55
Plan Fractions Treated to Date: 14
Plan Prescribed Dose Per Fraction: 2 Gy
Plan Total Fractions Prescribed: 15
Plan Total Prescribed Dose: 30 Gy
Reference Point Dosage Given to Date: 28 Gy
Reference Point Session Dosage Given: 2 Gy
Session Number: 39

## 2023-11-01 ENCOUNTER — Ambulatory Visit
Admission: RE | Admit: 2023-11-01 | Discharge: 2023-11-01 | Disposition: A | Discharge status: Home / Self Care | Source: Ambulatory Visit | Attending: Radiation Oncology | Admitting: Radiation Oncology

## 2023-11-01 ENCOUNTER — Other Ambulatory Visit: Payer: Self-pay

## 2023-11-01 DIAGNOSIS — C61 Malignant neoplasm of prostate: Secondary | ICD-10-CM | POA: Diagnosis not present

## 2023-11-01 DIAGNOSIS — Z191 Hormone sensitive malignancy status: Secondary | ICD-10-CM | POA: Diagnosis not present

## 2023-11-01 DIAGNOSIS — Z510 Encounter for antineoplastic radiation therapy: Secondary | ICD-10-CM | POA: Diagnosis not present

## 2023-11-01 LAB — RAD ONC ARIA SESSION SUMMARY
Course Elapsed Days: 56
Plan Fractions Treated to Date: 15
Plan Prescribed Dose Per Fraction: 2 Gy
Plan Total Fractions Prescribed: 15
Plan Total Prescribed Dose: 30 Gy
Reference Point Dosage Given to Date: 30 Gy
Reference Point Session Dosage Given: 2 Gy
Session Number: 40

## 2023-11-02 NOTE — Radiation Completion Notes (Addendum)
  Radiation Oncology         [phone] ________________________________  Name: [name] MRN: [id number]  Date: 11/01/2023  DOB: 13-Oct-1945  Referring Physician: BENJAMIN HERRICK, M.D. Date of Service: 2023-11-02 Radiation Oncologist: Adina Barge, M.D. [facility]     RADIATION ONCOLOGY END OF TREATMENT NOTE     Diagnosis:  78 y.o. gentleman with Stage T1c adenocarcinoma of the prostate with Gleason score of 4+4, and PSA of 19.7.   Intent: Curative     ==========DELIVERED PLANS==========  First Treatment Date: 2023-09-06 Last Treatment Date: 2023-11-01   Plan Name: Prostate_Pel Site: Prostate Technique: IMRT Mode: Photon Dose Per Fraction: 1.8 Gy Prescribed Dose (Delivered / Prescribed): 45 Gy / 45 Gy Prescribed Fxs (Delivered / Prescribed): 25 / 25   Plan Name: Prostate_Bst Site: Prostate Technique: IMRT Mode: Photon Dose Per Fraction: 2 Gy Prescribed Dose (Delivered / Prescribed): 30 Gy / 30 Gy Prescribed Fxs (Delivered / Prescribed): 15 / 15     ==========ON TREATMENT VISIT DATES========== 2023-09-09, 2023-09-15, 2023-09-23, 2023-09-29, 2023-10-07, 2023-10-13, 2023-10-21, 2023-10-27    See weekly On Treatment Notes in Epic for details in the Media tab (listed as Progress notes on the On Treatment Visit Dates listed above).  He tells the daily radiation treatments relatively well with some increased LUTS and modest fatigue.  The patient will receive a call in about one month from the radiation oncology department. He will continue follow up with his urologist, Dr. Cam, as well.  ------------------------------------------------   Donnice Barge, MD [facility]  Radiation Oncology Direct Dial: [phone]  Fax: [phone] [email or web address]  Skype  LinkedIn

## 2023-11-03 ENCOUNTER — Other Ambulatory Visit: Payer: Self-pay

## 2023-11-03 MED ORDER — METFORMIN HCL 500 MG PO TABS: ORAL_TABLET | ORAL | 1 refills | Status: AC

## 2023-11-10 NOTE — Progress Notes (Signed)
 Patient was a RadOnc Consult on 06/14/23 for his stage T1c adenocarcinoma of the prostate with Gleason score of 4+4, and PSA of 19.7.  Patient proceed with treatment recommendations of 8 weeks of daily external beam radiation,concurrent with ADT and had his final radiation treatment on 11/01/23.   Patient is scheduled for a post treatment nurse call on 11/25.

## 2023-11-11 ENCOUNTER — Other Ambulatory Visit: Payer: Self-pay | Admitting: Urology

## 2023-11-11 DIAGNOSIS — C61 Malignant neoplasm of prostate: Secondary | ICD-10-CM

## 2023-11-14 DIAGNOSIS — M5416 Radiculopathy, lumbar region: Secondary | ICD-10-CM | POA: Diagnosis not present

## 2023-11-14 DIAGNOSIS — M961 Postlaminectomy syndrome, not elsewhere classified: Secondary | ICD-10-CM | POA: Diagnosis not present

## 2023-11-14 DIAGNOSIS — M25552 Pain in left hip: Secondary | ICD-10-CM | POA: Diagnosis not present

## 2023-11-14 DIAGNOSIS — M48061 Spinal stenosis, lumbar region without neurogenic claudication: Secondary | ICD-10-CM | POA: Diagnosis not present

## 2023-11-22 NOTE — Progress Notes (Signed)
  Radiation Oncology         [phone] ________________________________  Name: BRAILYN DELMAN MRN: [id number]  Date of Service: 11/29/2023  DOB: 08-Oct-1945  Post Treatment Telephone Note  Diagnosis:  78 y.o. gentleman with Stage T1c adenocarcinoma of the prostate with Gleason score of 4+4, and PSA of 19.7.    Pre Treatment IPSS Score: 15  The patient was available for call today.   Symptoms of fatigue have improved since completing therapy.  Symptoms of bladder changes have improved since completing therapy. Current symptoms include frequency, and medications for bladder symptoms include alfuzosin .  Symptoms of bowel changes have improved since completing therapy. Current symptoms include occasional diarrhea, and/or constipation.  Post Treatment IPSS Score: IPSS Questionnaire (AUA-7): Over the past month.   1)  How often have you had a sensation of not emptying your bladder completely after you finish urinating?  3 - About half the time  2)  How often have you had to urinate again less than two hours after you finished urinating? 1 - Less than 1 time in 5  3)  How often have you found you stopped and started again several times when you urinated?  0 - Not at all  4) How difficult have you found it to postpone urination?  1 - Less than 1 time in 5  5) How often have you had a weak urinary stream?  1 - Less than 1 time in 5  6) How often have you had to push or strain to begin urination?  0 - Not at all  7) How many times did you most typically get up to urinate from the time you went to bed until the time you got up in the morning?  3 - 3 times  Total score:  9. Which indicates moderate symptoms  0-7 mildly symptomatic   8-19 moderately symptomatic   20-35 severely symptomatic   Patient has a scheduled follow up visit with his urologist, Dr. Morene Salines, on 12/27/2023 for ongoing surveillance. He was counseled that PSA levels will be drawn in the urology office, and was  reassured that additional time is expected to improve bowel and bladder symptoms. He was encouraged to call back with concerns or questions regarding radiation.

## 2023-11-29 ENCOUNTER — Ambulatory Visit
Admission: RE | Admit: 2023-11-29 | Discharge: 2023-11-29 | Disposition: A | Discharge status: Home / Self Care | Source: Ambulatory Visit | Attending: Radiation Oncology | Admitting: Radiation Oncology

## 2023-11-29 DIAGNOSIS — C61 Malignant neoplasm of prostate: Secondary | ICD-10-CM

## 2023-12-10 ENCOUNTER — Other Ambulatory Visit: Payer: Self-pay | Admitting: Family Medicine

## 2023-12-13 ENCOUNTER — Ambulatory Visit: Admitting: *Deleted

## 2023-12-13 VITALS — Ht 70.0 in | Wt 228.0 lb

## 2023-12-13 DIAGNOSIS — Z0000 Encounter for general adult medical examination without abnormal findings: Secondary | ICD-10-CM

## 2023-12-13 NOTE — Patient Instructions (Signed)
 [name],  Thank you for taking the time for your Medicare Wellness Visit. I appreciate your continued commitment to your health goals. Please review the care plan we discussed, and feel free to reach out if I can assist you further.  Please note that Annual Wellness Visits do not include a physical exam. Some assessments may be limited, especially if the visit was conducted virtually. If needed, we may recommend an in-person follow-up with your provider.  Ongoing Care Seeing your primary care provider every 3 to 6 months helps us  monitor your health and provide consistent, personalized care.   Referrals If a referral was made during today's visit and you haven't received any updates within two weeks, please contact the referred provider directly to check on the status.  Recommended Screenings:  Health Maintenance  Topic Date Due   Hepatitis C Screening  Never done   Hemoglobin A1C  02/26/2024   Eye exam for diabetics  04/05/2024   Yearly kidney health urinalysis for diabetes  04/06/2024   Complete foot exam   04/06/2024   Yearly kidney function blood test for diabetes  10/10/2024   Medicare Annual Wellness Visit  12/12/2024   DTaP/Tdap/Td vaccine (3 - Td or Tdap) 10/28/2031   Pneumococcal Vaccine for age over 59  Completed   Flu Shot  Completed   Meningitis B Vaccine  Aged Out   Colon Cancer Screening  Discontinued   COVID-19 Vaccine  Discontinued   Zoster (Shingles) Vaccine  Discontinued       12/09/2023    9:37 AM  Advanced Directives  Does Patient Have a Medical Advance Directive? Yes  Type of Advance Directive Healthcare Power of Attorney  Does patient want to make changes to medical advance directive? No - Guardian declined  Copy of Healthcare Power of Attorney in Chart? No - copy requested    Vision: Annual vision screenings are recommended for early detection of glaucoma, cataracts, and diabetic retinopathy. These exams can also reveal signs of chronic conditions  such as diabetes and high blood pressure.  Dental: Annual dental screenings help detect early signs of oral cancer, gum disease, and other conditions linked to overall health, including heart disease and diabetes.  Please see the attached documents for additional preventive care recommendations.    [name] , Thank you for taking time to come for your Medicare Wellness Visit. I appreciate your ongoing commitment to your health goals. Please review the following plan we discussed and let me know if I can assist you in the future.   Screening recommendations/referrals: Colonoscopy:  Recommended yearly ophthalmology/optometry visit for glaucoma screening and checkup Recommended yearly dental visit for hygiene and checkup  Vaccinations: Influenza vaccine:  Pneumococcal vaccine:  Tdap vaccine:  Shingles vaccine:       Preventive Care 65 Years and Older, Male Preventive care refers to lifestyle choices and visits with your health care provider that can promote health and wellness. What does preventive care include? A yearly physical exam. This is also called an annual well check. Dental exams once or twice a year. Routine eye exams. Ask your health care provider how often you should have your eyes checked. Personal lifestyle choices, including: Daily care of your teeth and gums. Regular physical activity. Eating a healthy diet. Avoiding tobacco and drug use. Limiting alcohol use. Practicing safe sex. Taking low doses of aspirin  every day. Taking vitamin and mineral supplements as recommended by your health care provider. What happens during an annual well check? The services and  screenings done by your health care provider during your annual well check will depend on your age, overall health, lifestyle risk factors, and family history of disease. Counseling  Your health care provider may ask you questions about your: Alcohol use. Tobacco use. Drug use. Emotional  well-being. Home and relationship well-being. Sexual activity. Eating habits. History of falls. Memory and ability to understand (cognition). Work and work astronomer. Screening  You may have the following tests or measurements: Height, weight, and BMI. Blood pressure. Lipid and cholesterol levels. These may be checked every 5 years, or more frequently if you are over 63 years old. Skin check. Lung cancer screening. You may have this screening every year starting at age 86 if you have a 30-pack-year history of smoking and currently smoke or have quit within the past 15 years. Fecal occult blood test (FOBT) of the stool. You may have this test every year starting at age 73. Flexible sigmoidoscopy or colonoscopy. You may have a sigmoidoscopy every 5 years or a colonoscopy every 10 years starting at age 27. Prostate cancer screening. Recommendations will vary depending on your family history and other risks. Hepatitis C blood test. Hepatitis B blood test. Sexually transmitted disease (STD) testing. Diabetes screening. This is done by checking your blood sugar (glucose) after you have not eaten for a while (fasting). You may have this done every 1-3 years. Abdominal aortic aneurysm (AAA) screening. You may need this if you are a current or former smoker. Osteoporosis. You may be screened starting at age 45 if you are at high risk. Talk with your health care provider about your test results, treatment options, and if necessary, the need for more tests. Vaccines  Your health care provider may recommend certain vaccines, such as: Influenza vaccine. This is recommended every year. Tetanus, diphtheria, and acellular pertussis (Tdap, Td) vaccine. You may need a Td booster every 10 years. Zoster vaccine. You may need this after age 36. Pneumococcal 13-valent conjugate (PCV13) vaccine. One dose is recommended after age 24. Pneumococcal polysaccharide (PPSV23) vaccine. One dose is recommended after  age 31. Talk to your health care provider about which screenings and vaccines you need and how often you need them. This information is not intended to replace advice given to you by your health care provider. Make sure you discuss any questions you have with your health care provider. Document Released: 01/17/2015 Document Revised: 09/10/2015 Document Reviewed: 10/22/2014 Elsevier Interactive Patient Education  2017 [employer].  Fall Prevention in the Home Falls can cause injuries. They can happen to people of all ages. There are many things you can do to make your home safe and to help prevent falls. What can I do on the outside of my home? Regularly fix the edges of walkways and driveways and fix any cracks. Remove anything that might make you trip as you walk through a door, such as a raised step or threshold. Trim any bushes or trees on the path to your home. Use bright outdoor lighting. Clear any walking paths of anything that might make someone trip, such as rocks or tools. Regularly check to see if handrails are loose or broken. Make sure that both sides of any steps have handrails. Any raised decks and porches should have guardrails on the edges. Have any leaves, snow, or ice cleared regularly. Use sand or salt on walking paths during winter. Clean up any spills in your garage right away. This includes oil or grease spills. What can I do in the  bathroom? Use night lights. Install grab bars by the toilet and in the tub and shower. Do not use towel bars as grab bars. Use non-skid mats or decals in the tub or shower. If you need to sit down in the shower, use a plastic, non-slip stool. Keep the floor dry. Clean up any water that spills on the floor as soon as it happens. Remove soap buildup in the tub or shower regularly. Attach bath mats securely with double-sided non-slip rug tape. Do not have throw rugs and other things on the floor that can make you trip. What can I do in the  bedroom? Use night lights. Make sure that you have a light by your bed that is easy to reach. Do not use any sheets or blankets that are too big for your bed. They should not hang down onto the floor. Have a firm chair that has side arms. You can use this for support while you get dressed. Do not have throw rugs and other things on the floor that can make you trip. What can I do in the kitchen? Clean up any spills right away. Avoid walking on wet floors. Keep items that you use a lot in easy-to-reach places. If you need to reach something above you, use a strong step stool that has a grab bar. Keep electrical cords out of the way. Do not use floor polish or wax that makes floors slippery. If you must use wax, use non-skid floor wax. Do not have throw rugs and other things on the floor that can make you trip. What can I do with my stairs? Do not leave any items on the stairs. Make sure that there are handrails on both sides of the stairs and use them. Fix handrails that are broken or loose. Make sure that handrails are as long as the stairways. Check any carpeting to make sure that it is firmly attached to the stairs. Fix any carpet that is loose or worn. Avoid having throw rugs at the top or bottom of the stairs. If you do have throw rugs, attach them to the floor with carpet tape. Make sure that you have a light switch at the top of the stairs and the bottom of the stairs. If you do not have them, ask someone to add them for you. What else can I do to help prevent falls? Wear shoes that: Do not have high heels. Have rubber bottoms. Are comfortable and fit you well. Are closed at the toe. Do not wear sandals. If you use a stepladder: Make sure that it is fully opened. Do not climb a closed stepladder. Make sure that both sides of the stepladder are locked into place. Ask someone to hold it for you, if possible. Clearly mark and make sure that you can see: Any grab bars or  handrails. First and last steps. Where the edge of each step is. Use tools that help you move around (mobility aids) if they are needed. These include: Canes. Walkers. Scooters. Crutches. Turn on the lights when you go into a dark area. Replace any light bulbs as soon as they burn out. Set up your furniture so you have a clear path. Avoid moving your furniture around. If any of your floors are uneven, fix them. If there are any pets around you, be aware of where they are. Review your medicines with your doctor. Some medicines can make you feel dizzy. This can increase your chance of falling. Ask your doctor what  other things that you can do to help prevent falls. This information is not intended to replace advice given to you by your health care provider. Make sure you discuss any questions you have with your health care provider. Document Released: 10/17/2008 Document Revised: 05/29/2015 Document Reviewed: 01/25/2014 Elsevier Interactive Patient Education  2017 [employer].

## 2023-12-13 NOTE — Progress Notes (Signed)
 Chief Complaint  Patient presents with   Medicare Wellness     Subjective:   [name] is a 78 y.o. male who presents for a Medicare Annual Wellness Visit.  Visit info / Clinical Intake: Medicare Wellness Visit Type:: Subsequent Annual Wellness Visit Persons participating in visit and providing information:: patient Medicare Wellness Visit Mode:: Video Since this visit was completed virtually, some vitals may be partially provided or unavailable. Missing vitals are due to the limitations of the virtual format.: Unable to obtain vitals - no equipment If Telephone or Video please confirm:: I connected with patient using audio/video enable telemedicine. I verified patient identity with two identifiers, discussed telehealth limitations, and patient agreed to proceed. Patient Location:: home Provider Location:: home Interpreter Needed?: No Pre-visit prep was completed: no AWV questionnaire completed by patient prior to visit?: yes Date:: 12/12/23 Living arrangements:: (Patient-Rptd) lives with spouse/significant other Patient's Overall Health Status Rating: (Patient-Rptd) good Typical amount of pain: (Patient-Rptd) some Does pain affect daily life?: (Patient-Rptd) no  Dietary Habits and Nutritional Risks How many meals a day?: (Patient-Rptd) 3 Eats fruit and vegetables daily?: (Patient-Rptd) yes Most meals are obtained by: (Patient-Rptd) preparing own meals Diabetic:: (!) yes Any non-healing wounds?: no How often do you check your BS?: 1 Would you like to be referred to a Nutritionist or for Diabetic Management? : no  Functional Status Activities of Daily Living (to include ambulation/medication): (Patient-Rptd) Independent Ambulation: (Patient-Rptd) Independent Medication Administration: (Patient-Rptd) Independent Home Management (perform basic housework or laundry): (Patient-Rptd) Independent Manage your own finances?: (Patient-Rptd) yes Primary transportation is:  (Patient-Rptd) driving Concerns about vision?: no *vision screening is required for WTM* Concerns about hearing?: no  Fall Screening Falls in the past year?: (Patient-Rptd) 0 Number of falls in past year: 0 Was there an injury with Fall?: 0 Fall Risk Category Calculator: 0 Patient Fall Risk Level: Low Fall Risk  Fall Risk Patient at Risk for Falls Due to: No Fall Risks Fall risk Follow up: Falls evaluation completed; Education provided; Falls prevention discussed  Home and Transportation Safety: All rugs have non-skid backing?: (Patient-Rptd) yes All stairs or steps have railings?: (!) (Patient-Rptd) no Grab bars in the bathtub or shower?: (Patient-Rptd) yes Have non-skid surface in bathtub or shower?: (Patient-Rptd) yes Good home lighting?: (Patient-Rptd) yes Regular seat belt use?: (Patient-Rptd) yes Hospital stays in the last year:: (Patient-Rptd) no  Cognitive Assessment Difficulty concentrating, remembering, or making decisions? : (Patient-Rptd) no Will 6CIT or Mini Cog be Completed: yes What year is it?: 0 points What month is it?: 0 points Give patient an address phrase to remember (5 components): Its very sunny outside today in December About what time is it?: 0 points Count backwards from 20 to 1: 0 points Say the months of the year in reverse: 0 points Repeat the address phrase from earlier: 0 points 6 CIT Score: 0 points  Advance Directives (For Healthcare) Does Patient Have a Medical Advance Directive?: Yes Does patient want to make changes to medical advance directive?: No - Guardian declined Type of Advance Directive: Healthcare Power of [employer] of Healthcare Power of Attorney in Chart?: No - copy requested  Reviewed/Updated  Reviewed/Updated: Reviewed All (Medical, Surgical, Family, Medications, Allergies, Care Teams, Patient Goals); Surgical History; Family History; Medications; Allergies; Care Teams; Patient Goals; Medical History    Allergies  (verified) Ciprofloxacin and Niacin   Current Medications (verified) Outpatient Encounter Medications as of 12/13/2023  Medication Sig   alfuzosin  (UROXATRAL ) 10 MG 24 hr tablet Take 10  mg by mouth daily with breakfast.   aspirin  EC 81 MG tablet Take 1 tablet (81 mg total) by mouth daily. Resume 4 days post-op   azelastine  (ASTELIN ) 0.1 % nasal spray USE 1 SPRAY IN EACH NOSTRIL TWICE DAILY AS DIRECTED   Blood Glucose Monitoring Suppl (ONE TOUCH ULTRA 2) w/Device KIT Use as directed to test sugars 1-2 times daily. Dx E11.9   calcium  carbonate (OSCAL) 1500 (600 Ca) MG TABS tablet Take 600 mg by mouth daily.   cetirizine (ZYRTEC) 10 MG tablet Take 10 mg by mouth daily.   diclofenac  (VOLTAREN ) 75 MG EC tablet TAKE 1 TABLET(75 MG) BY MOUTH TWICE DAILY WITH A MEAL   docusate sodium  (COLACE) 100 MG capsule Take 1 capsule (100 mg total) by mouth 2 (two) times daily as needed for mild constipation.   esomeprazole  (NEXIUM ) 40 MG capsule TAKE 1 CAPSULE BY MOUTH 2 TIMES A DAY BEFORE MEALS   famotidine  (PEPCID ) 40 MG tablet TAKE 1 TABLET(40 MG) BY MOUTH TWICE DAILY   Fluocinolone  Acetonide 0.01 % OIL Place 5 drops in ear(s) 2 (two) times daily.   glimepiride  (AMARYL ) 2 MG tablet TAKE 1 TABLET BY MOUTH EVERY DAY BEFORE BREAKFAST   glucose blood (ONETOUCH ULTRA) test strip Use as instructed to test sugars 1-2 times daily. Dx. E11.9   losartan  (COZAAR ) 100 MG tablet TAKE 1 TABLET(100 MG) BY MOUTH DAILY   metFORMIN  (GLUCOPHAGE ) 500 MG tablet TAKE 1 TABLET BY MOUTH TWICE DAILY   multivitamin (THERAGRAN) per tablet Take 1 tablet by mouth daily.   ORGOVYX 120 MG tablet Take 120 mg by mouth daily.   Probiotic Product (DIGESTIVE ADVANTAGE GUMMIES PO) Take 1 capsule by mouth daily.   simvastatin  (ZOCOR ) 40 MG tablet TAKE 1 TABLET BY MOUTH DAILY IN THE EVENING   No facility-administered encounter medications on file as of 12/13/2023.    History: Past Medical History:  Diagnosis Date   Abdominal pain 2016    Acute prostatitis 2014   Anxiety    BPH (benign prostatic hypertrophy) with urinary obstruction    Calculus of distal right ureter 2014   Cholelithiasis    Clotting disorder    Complication of anesthesia    urinary retention ; bladder didnt wake up; says last gallbladder surgery he shouldve been cathed with his med hx   Diabetes mellitus    Type II   Diverticulosis of colon    DJD (degenerative joint disease)    Dysuria 2014   Elevated PSA    Flank pain 2014   GERD (gastroesophageal reflux disease)    Gout    Gross hematuria 2014   Hemorrhoids    History of colonic polyps    History of DVT (deep vein thrombosis)    History of kidney stones    Hyperlipidemia    Hypertension, benign    Low back pain syndrome    Nephrolithiasis    Nocturia 04/04/2023   2018   Peyronie's disease 2018   2017, 2014   pulmonary nodule, left lower lobe    Spinal stenosis    Urinary retention 2014   UTI (urinary tract infection) 2017   2016   Varicose veins of lower extremities    Venous insufficiency    Past Surgical History:  Procedure Laterality Date   APPENDECTOMY     bilateral inguinal hernia repairs     CERVICAL DISC ARTHROPLASTY     CHOLECYSTECTOMY  03/07/2012   CHOLECYSTECTOMY N/A 03/07/2012   Procedure: LAPAROSCOPIC CHOLECYSTECTOMY;  Surgeon: Donnice  Ebbie, MD;  Location: [facility] OR;  Service: General;  Laterality: N/A;   COLONOSCOPY  07/2008   GOLD SEED IMPLANT N/A 08/26/2023   Procedure: INSERTION, GOLD SEEDS;  Surgeon: Cam Morene ORN, MD;  Location: [facility] OR;  Service: Urology;  Laterality: N/A;  GOLD SEED IMPLANTS AND SPACEOAR   HERNIA REPAIR     ventral hernia with mesh 1990s   JOINT REPLACEMENT     left ankle surgery with plate and screws  11/2007   by Dr. Harden   LITHOTRIPSY     LUMBAR LAMINECTOMY/DECOMPRESSION MICRODISCECTOMY N/A 05/19/2016   Procedure: Microlumbar decompression L3-4, L4-5;  Surgeon: Duwayne Purchase, MD;  Location: [facility] ORS;  Service: Orthopedics;   Laterality: N/A;   LUMBAR LAMINECTOMY/DECOMPRESSION MICRODISCECTOMY N/A 05/19/2017   Procedure: Microlumbar decompression Lumbar five-Sacral one;  Surgeon: Duwayne Purchase, MD;  Location: [facility] OR;  Service: Orthopedics;  Laterality: N/A;   PROSTATE BIOPSY     sebaceous cyst surgery     SPACE OAR INSTILLATION N/A 08/26/2023   Procedure: INJECTION, HYDROGEL SPACER;  Surgeon: Cam Morene ORN, MD;  Location: [facility] OR;  Service: Urology;  Laterality: N/A;   t1-t2 disc surgery  2007   by Dr. Lucilla   TONSILLECTOMY     TOTAL KNEE ARTHROPLASTY     right and left   Family History  Problem Relation Age of Onset   Gout Father    Coronary artery disease Father 60       died of massive MI age 51   Heart attack Father    Diabetes Mother    CAD Brother 14   Colon cancer Neg Hx    Colon polyps Neg Hx    Esophageal cancer Neg Hx    Rectal cancer Neg Hx    Stomach cancer Neg Hx    Social History   Occupational History   Occupation: retired  Tobacco Use   Smoking status: Never   Smokeless tobacco: Never  Vaping Use   Vaping status: Never Used  Substance and Sexual Activity   Alcohol use: Not Currently    Comment: social  use   Drug use: No   Sexual activity: Not Currently   Tobacco Counseling Counseling given: Not Answered  SDOH Screenings   Food Insecurity: No Food Insecurity (12/13/2023)  Housing: Low Risk  (12/13/2023)  Transportation Needs: No Transportation Needs (12/13/2023)  Utilities: Not At Risk (12/13/2023)  Alcohol Screen: Low Risk  (12/09/2023)  Depression (PHQ2-9): Low Risk  (12/13/2023)  Financial Resource Strain: Low Risk  (12/09/2023)  Physical Activity: Insufficiently Active (12/13/2023)  Social Connections: Unknown (12/13/2023)  Stress: No Stress Concern Present (12/13/2023)  Tobacco Use: Low Risk  (10/11/2023)  Health Literacy: Adequate Health Literacy (12/13/2023)   See flowsheets for full screening details  Depression Screen PHQ 2 & 9 Depression Scale- Over the past 2  weeks, how often have you been bothered by any of the following problems? Little interest or pleasure in doing things: 0 Feeling down, depressed, or hopeless (PHQ Adolescent also includes...irritable): 0 PHQ-2 Total Score: 0 Trouble falling or staying asleep, or sleeping too much: 0 Feeling tired or having little energy: 0 Poor appetite or overeating (PHQ Adolescent also includes...weight loss): 0 Feeling bad about yourself - or that you are a failure or have let yourself or your family down: 0 Trouble concentrating on things, such as reading the newspaper or watching television (PHQ Adolescent also includes...like school work): 0 Moving or speaking so slowly that other people could have noticed. Or the opposite -  being so fidgety or restless that you have been moving around a lot more than usual: 0 Thoughts that you would be better off dead, or of hurting yourself in some way: 0 PHQ-9 Total Score: 0 If you checked off any problems, how difficult have these problems made it for you to do your work, take care of things at home, or get along with other people?: Not difficult at all     Goals Addressed             This Visit's Progress    Increase physical activity   On track    Patient Stated   On track    Increase activity by walking more     Patient Stated       Get strength back Get off hormone pills             Objective:    Today's Vitals   12/13/23 1131  Weight: 228 lb (103.4 kg)  Height: 5' 10 (1.778 m)   Body mass index is 32.71 kg/m.  Hearing/Vision screen Hearing Screening - Comments:: No trouble hearing Vision Screening - Comments:: Up to date [facility] Immunizations and Health Maintenance Health Maintenance  Topic Date Due   Hepatitis C Screening  Never done   HEMOGLOBIN A1C  02/26/2024   OPHTHALMOLOGY EXAM  04/05/2024   Diabetic kidney evaluation - Urine ACR  04/06/2024   FOOT EXAM  04/06/2024   Diabetic kidney evaluation - eGFR measurement   10/10/2024   Medicare Annual Wellness (AWV)  12/12/2024   DTaP/Tdap/Td (3 - Td or Tdap) 10/28/2031   Pneumococcal Vaccine: 50+ Years  Completed   Influenza Vaccine  Completed   Meningococcal B Vaccine  Aged Out   Colonoscopy  Discontinued   COVID-19 Vaccine  Discontinued   Zoster Vaccines- Shingrix  Discontinued        Assessment/Plan:  This is a routine wellness examination for [place].  Patient Care Team: Mahlon Comer BRAVO, MD as PCP - General (Family Medicine) Ernie Cough, MD as Consulting Physician (Orthopedic Surgery) Abran Norleen SAILOR, MD as Consulting Physician (Gastroenterology) Robinson Idol, MD as Consulting Physician (Ophthalmology) [facility], Rande, MD as Attending Physician Duwayne Purchase, MD as Consulting Physician (Orthopedic Surgery) Cary Doffing, MD as Consulting Physician (Dermatology) [place] (Dentistry) Vertell Pont, RN as Oncology Nurse Navigator  I have personally reviewed and noted the following in the patient's chart:   Medical and social history Use of alcohol, tobacco or illicit drugs  Current medications and supplements including opioid prescriptions. Functional ability and status Nutritional status Physical activity Advanced directives List of other physicians Hospitalizations, surgeries, and ER visits in previous 12 months Vitals Screenings to include cognitive, depression, and falls Referrals and appointments  No orders of the defined types were placed in this encounter.  In addition, I have reviewed and discussed with patient certain preventive protocols, quality metrics, and best practice recommendations. A written personalized care plan for preventive services as well as general preventive health recommendations were provided to patient.   Mliss Graff, LPN   [id number]   Return in 1 year (on 12/12/2024).  After Visit Summary: (MyChart) Due to this being a telephonic visit, the after visit summary with patients personalized  plan was offered to patient via MyChart   Nurse Notes:

## 2023-12-20 DIAGNOSIS — C61 Malignant neoplasm of prostate: Secondary | ICD-10-CM | POA: Diagnosis not present

## 2023-12-24 ENCOUNTER — Other Ambulatory Visit: Payer: Self-pay | Admitting: Family Medicine

## 2024-01-24 ENCOUNTER — Other Ambulatory Visit: Payer: Self-pay | Admitting: Family Medicine

## 2024-01-31 ENCOUNTER — Encounter: Payer: Self-pay | Admitting: *Deleted

## 2024-02-23 ENCOUNTER — Inpatient Hospital Stay: Admitting: *Deleted

## 2024-04-09 ENCOUNTER — Ambulatory Visit: Admitting: Family Medicine
